# Patient Record
Sex: Female | Born: 1945
Health system: Northeastern US, Academic
[De-identification: ages and names within clinical notes are randomized; demographics above are authoritative.]

## PROBLEM LIST (undated history)

## (undated) DIAGNOSIS — I10 Essential (primary) hypertension: Secondary | ICD-10-CM

## (undated) DIAGNOSIS — E119 Type 2 diabetes mellitus without complications: Secondary | ICD-10-CM

## (undated) DIAGNOSIS — C259 Malignant neoplasm of pancreas, unspecified: Secondary | ICD-10-CM

## (undated) HISTORY — DX: Malignant neoplasm of pancreas, unspecified: C25.9

## (undated) HISTORY — PX: CHOLECYSTECTOMY: SHX55

## (undated) HISTORY — PX: APPENDECTOMY: SHX54

## (undated) HISTORY — DX: Essential (primary) hypertension: I10

## (undated) HISTORY — PX: BACK SURGERY: SHX140

## (undated) HISTORY — DX: Type 2 diabetes mellitus without complications: E11.9

---

## 2015-08-02 ENCOUNTER — Encounter: Payer: Self-pay | Admitting: Internal Medicine

## 2019-08-01 ENCOUNTER — Inpatient Hospital Stay
Admit: 2019-08-01 | Disposition: A | Discharge status: Home Health | Source: Other Acute Inpatient Hospital | Attending: Surgery | Admitting: Surgery

## 2019-08-01 ENCOUNTER — Observation Stay: Admission: AD | Admit: 2019-08-01 | Payer: 59

## 2019-08-02 LAB — HX CHEM-PANELS
HX ANION GAP: 10 (ref 3–14)
HX ANION GAP: 6 (ref 3–14)
HX BLOOD UREA NITROGEN: 4 mg/dL — ABNORMAL LOW (ref 6–24)
HX BLOOD UREA NITROGEN: 6 mg/dL (ref 6–24)
HX CHLORIDE (CL): 106 meq/L (ref 98–110)
HX CHLORIDE (CL): 106 meq/L (ref 98–110)
HX CO2: 24 meq/L (ref 20–30)
HX CO2: 28 meq/L (ref 20–30)
HX CREATININE (CR): 0.71 mg/dL (ref 0.57–1.30)
HX CREATININE (CR): 0.73 mg/dL (ref 0.57–1.30)
HX GFR, AFRICAN AMERICAN: 93 mL/min/{1.73_m2}
HX GFR, AFRICAN AMERICAN: 97 mL/min/{1.73_m2}
HX GFR, NON-AFRICAN AMERICAN: 81 mL/min/{1.73_m2}
HX GFR, NON-AFRICAN AMERICAN: 83 mL/min/{1.73_m2}
HX GLUCOSE: 122 mg/dL (ref 70–139)
HX GLUCOSE: 140 mg/dL — ABNORMAL HIGH (ref 70–139)
HX POTASSIUM (K): 3.3 meq/L — ABNORMAL LOW (ref 3.6–5.1)
HX POTASSIUM (K): 3.5 meq/L — ABNORMAL LOW (ref 3.6–5.1)
HX SODIUM (NA): 140 meq/L (ref 135–145)
HX SODIUM (NA): 140 meq/L (ref 135–145)

## 2019-08-02 LAB — HX HEM-ROUTINE
HX BASO #: 0 10*3/uL (ref 0.0–0.2)
HX BASO: 1 %
HX EOSIN #: 0.1 10*3/uL (ref 0.0–0.5)
HX EOSIN: 2 %
HX HCT: 32.2 % (ref 32.0–45.0)
HX HGB: 10.9 g/dL — ABNORMAL LOW (ref 11.0–15.0)
HX IMMATURE GRANULOCYTE#: 0 10*3/uL (ref 0.0–0.1)
HX IMMATURE GRANULOCYTE: 1 %
HX LYMPH #: 1.4 10*3/uL (ref 1.0–4.0)
HX LYMPH: 25 %
HX MCH: 29.2 pg (ref 26.0–34.0)
HX MCHC: 33.9 g/dL (ref 32.0–36.0)
HX MCV: 86.3 fL (ref 80.0–98.0)
HX MONO #: 0.5 10*3/uL (ref 0.2–0.8)
HX MONO: 9 %
HX MPV: 9.9 fL (ref 9.1–11.7)
HX NEUT #: 3.4 10*3/uL (ref 1.5–7.5)
HX NRBC #: 0 10*3/uL
HX NUCLEATED RBC: 0 %
HX PLT: 287 10*3/uL (ref 150–400)
HX RBC BLOOD COUNT: 3.73 M/uL (ref 3.70–5.00)
HX RDW: 13.9 % (ref 11.5–14.5)
HX SEG NEUT: 63 %
HX WBC: 5.4 10*3/uL (ref 4.0–11.0)

## 2019-08-02 LAB — HX COAGULATION
HX INR PT: 1.1 (ref 0.9–1.3)
HX PROTHROMBIN TIME: 12.8 s (ref 9.7–14.0)
HX PTT: 29.5 s (ref 25.7–35.7)

## 2019-08-02 LAB — HX POINT OF CARE
HX GLUCOSE-POCT: 110 mg/dL (ref 70–139)
HX GLUCOSE-POCT: 110 mg/dL (ref 70–139)
HX GLUCOSE-POCT: 99 mg/dL (ref 70–139)
HX GLUCOSE-POCT: 97 mg/dL (ref 70–139)

## 2019-08-02 LAB — HX CHEM-LFT
HX ALANINE AMINOTRANSFERASE (ALT/SGPT): 34 IU/L (ref 0–54)
HX ALKALINE PHOSPHATASE (ALK): 260 IU/L — ABNORMAL HIGH (ref 40–130)
HX ASPARTATE AMINOTRANFERASE (AST/SGOT): 46 IU/L — ABNORMAL HIGH (ref 10–42)
HX BILIRUBIN, DIRECT: 0.5 mg/dL (ref 0.0–0.5)
HX BILIRUBIN, TOTAL: 0.6 mg/dL (ref 0.2–1.1)
HX LACTATE DEHYDROGENASE (LDH): 194 IU/L (ref 120–220)

## 2019-08-02 LAB — HX CHEM-OTHER
HX ALBUMIN: 3.3 g/dL — ABNORMAL LOW (ref 3.4–4.8)
HX CALCIUM (CA): 8.4 mg/dL — ABNORMAL LOW (ref 8.5–10.5)
HX CALCIUM (CA): 8.5 mg/dL (ref 8.5–10.5)
HX LIPASE: 220 IU/L — ABNORMAL HIGH (ref 8–60)
HX MAGNESIUM: 1.5 mg/dL — ABNORMAL LOW (ref 1.6–2.6)
HX MAGNESIUM: 1.5 mg/dL — ABNORMAL LOW (ref 1.6–2.6)
HX PHOSPHORUS: 2.8 mg/dL (ref 2.7–4.5)
HX PHOSPHORUS: 3.6 mg/dL (ref 2.7–4.5)

## 2019-08-02 LAB — HX MICRO-RESP VIRAL PANEL: HX COVID-19 (SARS-COV-2) ADMIT SCREEN: NEGATIVE

## 2019-08-02 LAB — HX DIABETES
HX GLUCOSE: 122 mg/dL (ref 70–139)
HX GLUCOSE: 140 mg/dL — ABNORMAL HIGH (ref 70–139)

## 2019-08-03 LAB — HX POINT OF CARE
HX GLUCOSE-POCT: 74 mg/dL (ref 70–139)
HX GLUCOSE-POCT: 83 mg/dL (ref 70–139)
HX GLUCOSE-POCT: 93 mg/dL (ref 70–139)
HX GLUCOSE-POCT: 99 mg/dL (ref 70–139)

## 2019-08-03 LAB — HX CHOLESTEROL
HX CHOLESTEROL: 146 mg/dL (ref 110–199)
HX HIGH DENSITY LIPOPROTEIN CHOL (HDL): 42 mg/dL (ref 35–75)
HX LDL: 79 mg/dL (ref 0–129)
HX TRIGLYCERIDES: 126 mg/dL (ref 40–250)

## 2019-08-03 LAB — HX HEM-ROUTINE
HX BASO #: 0 10*3/uL (ref 0.0–0.2)
HX BASO: 1 %
HX EOSIN #: 0.1 10*3/uL (ref 0.0–0.5)
HX EOSIN: 3 %
HX HCT: 32.2 % (ref 32.0–45.0)
HX HGB: 10.6 g/dL — ABNORMAL LOW (ref 11.0–15.0)
HX IMMATURE GRANULOCYTE#: 0 10*3/uL (ref 0.0–0.1)
HX IMMATURE GRANULOCYTE: 1 %
HX LYMPH #: 1.2 10*3/uL (ref 1.0–4.0)
HX LYMPH: 27 %
HX MCH: 29.8 pg (ref 26.0–34.0)
HX MCHC: 32.9 g/dL (ref 32.0–36.0)
HX MCV: 90.4 fL (ref 80.0–98.0)
HX MONO #: 0.3 10*3/uL (ref 0.2–0.8)
HX MONO: 7 %
HX MPV: 11.3 fL (ref 9.1–11.7)
HX NEUT #: 2.9 10*3/uL (ref 1.5–7.5)
HX NRBC #: 0 10*3/uL
HX NUCLEATED RBC: 0 %
HX PLT: 204 10*3/uL (ref 150–400)
HX RBC BLOOD COUNT: 3.56 M/uL — ABNORMAL LOW (ref 3.70–5.00)
HX RDW: 14.3 % (ref 11.5–14.5)
HX SEG NEUT: 62 %
HX WBC: 4.7 10*3/uL (ref 4.0–11.0)

## 2019-08-03 LAB — HX CHEM-PANELS
HX ANION GAP: 10 (ref 3–14)
HX BLOOD UREA NITROGEN: 5 mg/dL — ABNORMAL LOW (ref 6–24)
HX CHLORIDE (CL): 106 meq/L (ref 98–110)
HX CO2: 19 meq/L — ABNORMAL LOW (ref 20–30)
HX CREATININE (CR): 0.74 mg/dL (ref 0.57–1.30)
HX GFR, AFRICAN AMERICAN: 92 mL/min/{1.73_m2}
HX GFR, NON-AFRICAN AMERICAN: 79 mL/min/{1.73_m2}
HX GLUCOSE: 96 mg/dL (ref 70–139)
HX POTASSIUM (K): 4.7 meq/L (ref 3.6–5.1)
HX SODIUM (NA): 135 meq/L (ref 135–145)

## 2019-08-03 LAB — HX CHEM-LFT
HX CA 125 - FUTURE, ABBOTT: 26 U/mL (ref 0–34)
HX CEA: 3.7 ng/mL (ref 0.0–4.9)

## 2019-08-03 LAB — HX CHEM-LIPIDS
HX CHOL-HDL RATIO: 3.5
HX CHOLESTEROL: 146 mg/dL (ref 110–199)
HX HIGH DENSITY LIPOPROTEIN CHOL (HDL): 42 mg/dL (ref 35–75)
HX HOURS FAST: 0 h
HX LDL: 79 mg/dL (ref 0–129)
HX TRIGLYCERIDES: 126 mg/dL (ref 40–250)

## 2019-08-03 LAB — HX CHEM-OTHER
HX CALCIUM (CA): 8.4 mg/dL — ABNORMAL LOW (ref 8.5–10.5)
HX MAGNESIUM: 1.6 mg/dL (ref 1.6–2.6)
HX PHOSPHORUS: 3.6 mg/dL (ref 2.7–4.5)

## 2019-08-03 LAB — HX DIABETES: HX GLUCOSE: 96 mg/dL (ref 70–139)

## 2019-08-04 ENCOUNTER — Other Ambulatory Visit: Payer: Self-pay | Admitting: Hematology

## 2019-08-04 ENCOUNTER — Other Ambulatory Visit: Payer: Self-pay

## 2019-08-04 LAB — HX HEM-ROUTINE
HX BASO #: 0 10*3/uL (ref 0.0–0.2)
HX BASO: 1 %
HX EOSIN #: 0.1 10*3/uL (ref 0.0–0.5)
HX EOSIN: 3 %
HX HCT: 31.1 % — ABNORMAL LOW (ref 32.0–45.0)
HX HGB: 10.4 g/dL — ABNORMAL LOW (ref 11.0–15.0)
HX IMMATURE GRANULOCYTE#: 0 10*3/uL (ref 0.0–0.1)
HX IMMATURE GRANULOCYTE: 1 %
HX LYMPH #: 1.4 10*3/uL (ref 1.0–4.0)
HX LYMPH: 32 %
HX MCH: 29.5 pg (ref 26.0–34.0)
HX MCHC: 33.4 g/dL (ref 32.0–36.0)
HX MCV: 88.1 fL (ref 80.0–98.0)
HX MONO #: 0.3 10*3/uL (ref 0.2–0.8)
HX MONO: 7 %
HX MPV: 9.9 fL (ref 9.1–11.7)
HX NEUT #: 2.6 10*3/uL (ref 1.5–7.5)
HX NRBC #: 0 10*3/uL
HX NUCLEATED RBC: 0 %
HX PLT: 271 10*3/uL (ref 150–400)
HX RBC BLOOD COUNT: 3.53 M/uL — ABNORMAL LOW (ref 3.70–5.00)
HX RDW: 14.3 % (ref 11.5–14.5)
HX SEG NEUT: 57 %
HX WBC: 4.5 10*3/uL (ref 4.0–11.0)

## 2019-08-04 LAB — HX CHEM-PANELS
HX ANION GAP: 10 (ref 3–14)
HX BLOOD UREA NITROGEN: 5 mg/dL — ABNORMAL LOW (ref 6–24)
HX CHLORIDE (CL): 105 meq/L (ref 98–110)
HX CO2: 23 meq/L (ref 20–30)
HX CREATININE (CR): 0.75 mg/dL (ref 0.57–1.30)
HX GFR, AFRICAN AMERICAN: 90 mL/min/{1.73_m2}
HX GFR, NON-AFRICAN AMERICAN: 78 mL/min/{1.73_m2}
HX GLUCOSE: 96 mg/dL (ref 70–139)
HX POTASSIUM (K): 3.7 meq/L (ref 3.6–5.1)
HX SODIUM (NA): 138 meq/L (ref 135–145)

## 2019-08-04 LAB — HX CHEM-OTHER
HX CALCIUM (CA): 8.4 mg/dL — ABNORMAL LOW (ref 8.5–10.5)
HX MAGNESIUM: 1.3 mg/dL — ABNORMAL LOW (ref 1.6–2.6)
HX PHOSPHORUS: 4 mg/dL (ref 2.7–4.5)

## 2019-08-04 LAB — HX COAGULATION
HX INR PT: 1.1 (ref 0.9–1.3)
HX PROTHROMBIN TIME: 13 s (ref 9.7–14.0)
HX PTT: 41.9 s — ABNORMAL HIGH (ref 25.7–35.7)

## 2019-08-04 LAB — HX POINT OF CARE
HX GLUCOSE-POCT: 78 mg/dL (ref 70–139)
HX GLUCOSE-POCT: 121 mg/dL (ref 70–139)
HX GLUCOSE-POCT: 85 mg/dL (ref 70–139)
HX GLUCOSE-POCT: 219 mg/dL — ABNORMAL HIGH (ref 70–139)
HX GLUCOSE-POCT: 428 mg/dL — ABNORMAL HIGH (ref 70–139)

## 2019-08-04 LAB — HX DIABETES: HX GLUCOSE: 96 mg/dL (ref 70–139)

## 2019-08-04 LAB — HX TRANSFUSION
HX ABO RH CONFIRMATORY TYPE, MANUAL: A POS
HX ABO-RH INTERPRETATION (GEL): A POS
HX ANTIBODY SCREEN (GEL): NEGATIVE

## 2019-08-05 LAB — HX POINT OF CARE
HX GLUCOSE-POCT: 137 mg/dL (ref 70–139)
HX GLUCOSE-POCT: 146 mg/dL — ABNORMAL HIGH (ref 70–139)
HX GLUCOSE-POCT: 135 mg/dL (ref 70–139)
HX GLUCOSE-POCT: 172 mg/dL — ABNORMAL HIGH (ref 70–139)

## 2019-08-05 LAB — HX IMMUNOLOGY
HX IGG SUBCLASS TOTAL: 1047 mg/dL
HX IGG SUBCLASS-1: 633 mg/dL
HX IGG SUBCLASS-2: 217 mg/dL — ABNORMAL LOW
HX IGG SUBCLASS-3: 73 mg/dL
HX IGG SUBCLASS-4: 12.3 mg/dL

## 2019-08-05 LAB — HX CHEM-PANELS
HX ANION GAP: 10 (ref 3–14)
HX BLOOD UREA NITROGEN: 6 mg/dL (ref 6–24)
HX CHLORIDE (CL): 101 meq/L (ref 98–110)
HX CO2: 28 meq/L (ref 20–30)
HX CREATININE (CR): 0.72 mg/dL (ref 0.57–1.30)
HX GFR, AFRICAN AMERICAN: 95 mL/min/{1.73_m2}
HX GFR, NON-AFRICAN AMERICAN: 82 mL/min/{1.73_m2}
HX GLUCOSE: 144 mg/dL — ABNORMAL HIGH (ref 70–139)
HX POTASSIUM (K): 3.4 meq/L — ABNORMAL LOW (ref 3.6–5.1)
HX SODIUM (NA): 139 meq/L (ref 135–145)

## 2019-08-05 LAB — HX DIABETES: HX GLUCOSE: 144 mg/dL — ABNORMAL HIGH (ref 70–139)

## 2019-08-05 LAB — HX CHEM-LFT: HX CA 19-9: 1901 U/mL — ABNORMAL HIGH

## 2019-08-05 LAB — HX CHEM-OTHER
HX CALCIUM (CA): 8.4 mg/dL — ABNORMAL LOW (ref 8.5–10.5)
HX MAGNESIUM: 1.7 mg/dL (ref 1.6–2.6)
HX PHOSPHORUS: 3.7 mg/dL (ref 2.7–4.5)

## 2019-08-06 LAB — HX CHEM-PANELS
HX ANION GAP: 8 (ref 3–14)
HX BLOOD UREA NITROGEN: 12 mg/dL (ref 6–24)
HX CHLORIDE (CL): 103 meq/L (ref 98–110)
HX CO2: 25 meq/L (ref 20–30)
HX CREATININE (CR): 0.74 mg/dL (ref 0.57–1.30)
HX GFR, AFRICAN AMERICAN: 92 mL/min/{1.73_m2}
HX GFR, NON-AFRICAN AMERICAN: 79 mL/min/{1.73_m2}
HX GLUCOSE: 197 mg/dL — ABNORMAL HIGH (ref 70–139)
HX POTASSIUM (K): 4 meq/L (ref 3.6–5.1)
HX SODIUM (NA): 136 meq/L (ref 135–145)

## 2019-08-06 LAB — HX CHEM-OTHER
HX CALCIUM (CA): 8.5 mg/dL (ref 8.5–10.5)
HX MAGNESIUM: 1.8 mg/dL (ref 1.6–2.6)
HX PHOSPHORUS: 4.1 mg/dL (ref 2.7–4.5)

## 2019-08-06 LAB — HX POINT OF CARE
HX GLUCOSE-POCT: 132 mg/dL (ref 70–139)
HX GLUCOSE-POCT: 159 mg/dL — ABNORMAL HIGH (ref 70–139)
HX GLUCOSE-POCT: 172 mg/dL — ABNORMAL HIGH (ref 70–139)
HX GLUCOSE-POCT: 256 mg/dL — ABNORMAL HIGH (ref 70–139)

## 2019-08-06 LAB — HX CYTO GYN & NON GYN

## 2019-08-06 LAB — HX DIABETES: HX GLUCOSE: 197 mg/dL — ABNORMAL HIGH (ref 70–139)

## 2019-08-06 MED FILL — *ONDANSETRON HCL 4 MG TAB: 5 days supply | Qty: 20 | Fill #0 | Status: AC

## 2019-08-07 LAB — HX POINT OF CARE
HX GLUCOSE-POCT: 180 mg/dL — ABNORMAL HIGH (ref 70–139)
HX GLUCOSE-POCT: 286 mg/dL — ABNORMAL HIGH (ref 70–139)
HX GLUCOSE-POCT: 104 mg/dL (ref 70–139)
HX GLUCOSE-POCT: 311 mg/dL — ABNORMAL HIGH (ref 70–139)
HX GLUCOSE-POCT: 327 mg/dL — ABNORMAL HIGH (ref 70–139)
HX GLUCOSE-POCT: 332 mg/dL — ABNORMAL HIGH (ref 70–139)

## 2019-08-07 LAB — HX CHEM-PANELS
HX ANION GAP: 8 (ref 3–14)
HX BLOOD UREA NITROGEN: 19 mg/dL (ref 6–24)
HX CHLORIDE (CL): 105 meq/L (ref 98–110)
HX CO2: 25 meq/L (ref 20–30)
HX CREATININE (CR): 0.73 mg/dL (ref 0.57–1.30)
HX GFR, AFRICAN AMERICAN: 93 mL/min/{1.73_m2}
HX GFR, NON-AFRICAN AMERICAN: 81 mL/min/{1.73_m2}
HX GLUCOSE: 189 mg/dL — ABNORMAL HIGH (ref 70–139)
HX POTASSIUM (K): 4.1 meq/L (ref 3.6–5.1)
HX SODIUM (NA): 138 meq/L (ref 135–145)

## 2019-08-07 LAB — HX CHEM-OTHER
HX CALCIUM (CA): 8.5 mg/dL (ref 8.5–10.5)
HX MAGNESIUM: 1.8 mg/dL (ref 1.6–2.6)
HX PHOSPHORUS: 4.3 mg/dL (ref 2.7–4.5)

## 2019-08-07 LAB — HX DIABETES: HX GLUCOSE: 189 mg/dL — ABNORMAL HIGH (ref 70–139)

## 2019-08-08 LAB — HX POINT OF CARE
HX GLUCOSE-POCT: 292 mg/dL — ABNORMAL HIGH (ref 70–139)
HX GLUCOSE-POCT: 276 mg/dL — ABNORMAL HIGH (ref 70–139)
HX GLUCOSE-POCT: 124 mg/dL (ref 70–139)
HX GLUCOSE-POCT: 434 mg/dL — ABNORMAL HIGH (ref 70–139)

## 2019-08-08 LAB — HX HEM-ROUTINE
HX BASO #: 0 10*3/uL (ref 0.0–0.2)
HX BASO: 1 %
HX EOSIN #: 0.1 10*3/uL (ref 0.0–0.5)
HX EOSIN: 3 %
HX HCT: 33.3 % (ref 32.0–45.0)
HX HGB: 11.3 g/dL (ref 11.0–15.0)
HX IMMATURE GRANULOCYTE#: 0.1 10*3/uL (ref 0.0–0.1)
HX IMMATURE GRANULOCYTE: 1 %
HX LYMPH #: 1.5 10*3/uL (ref 1.0–4.0)
HX LYMPH: 26 %
HX MCH: 29.4 pg (ref 26.0–34.0)
HX MCHC: 33.9 g/dL (ref 32.0–36.0)
HX MCV: 86.7 fL (ref 80.0–98.0)
HX MONO #: 0.5 10*3/uL (ref 0.2–0.8)
HX MONO: 10 %
HX MPV: 9.9 fL (ref 9.1–11.7)
HX NEUT #: 3.4 10*3/uL (ref 1.5–7.5)
HX NRBC #: 0 10*3/uL
HX NUCLEATED RBC: 0 %
HX PLT: 288 10*3/uL (ref 150–400)
HX RBC BLOOD COUNT: 3.84 M/uL (ref 3.70–5.00)
HX RDW: 14.1 % (ref 11.5–14.5)
HX SEG NEUT: 60 %
HX WBC: 5.6 10*3/uL (ref 4.0–11.0)

## 2019-08-08 LAB — HX CHEM-PANELS
HX ANION GAP: 7 (ref 3–14)
HX BLOOD UREA NITROGEN: 27 mg/dL — ABNORMAL HIGH (ref 6–24)
HX CHLORIDE (CL): 103 meq/L (ref 98–110)
HX CO2: 26 meq/L (ref 20–30)
HX CREATININE (CR): 0.77 mg/dL (ref 0.57–1.30)
HX GFR, AFRICAN AMERICAN: 88 mL/min/{1.73_m2}
HX GFR, NON-AFRICAN AMERICAN: 76 mL/min/{1.73_m2}
HX GLUCOSE: 312 mg/dL — ABNORMAL HIGH (ref 70–139)
HX POTASSIUM (K): 4.3 meq/L (ref 3.6–5.1)
HX SODIUM (NA): 136 meq/L (ref 135–145)

## 2019-08-08 LAB — HX CHEM-BLOODGAS: HX PH: 7.42 (ref 7.35–7.45)

## 2019-08-08 LAB — HX DIABETES: HX GLUCOSE: 312 mg/dL — ABNORMAL HIGH (ref 70–139)

## 2019-08-08 LAB — HX CHEM-OTHER
HX IONIZED CALCIUM: 4.61 mg/dL (ref 4.20–5.20)
HX MAGNESIUM: 1.9 mg/dL (ref 1.6–2.6)
HX PHOSPHORUS: 4.2 mg/dL (ref 2.7–4.5)

## 2019-08-09 LAB — HX HEM-ROUTINE
HX BASO #: 0 10*3/uL (ref 0.0–0.2)
HX BASO: 1 %
HX EOSIN #: 0.1 10*3/uL (ref 0.0–0.5)
HX EOSIN: 2 %
HX HCT: 33.2 % (ref 32.0–45.0)
HX HGB: 11 g/dL (ref 11.0–15.0)
HX IMMATURE GRANULOCYTE#: 0 10*3/uL (ref 0.0–0.1)
HX IMMATURE GRANULOCYTE: 1 %
HX LYMPH #: 1.2 10*3/uL (ref 1.0–4.0)
HX LYMPH: 22 %
HX MCH: 29.3 pg (ref 26.0–34.0)
HX MCHC: 33.1 g/dL (ref 32.0–36.0)
HX MCV: 88.5 fL (ref 80.0–98.0)
HX MONO #: 0.5 10*3/uL (ref 0.2–0.8)
HX MONO: 9 %
HX MPV: 10.1 fL (ref 9.1–11.7)
HX NEUT #: 3.6 10*3/uL (ref 1.5–7.5)
HX NRBC #: 0 10*3/uL
HX NUCLEATED RBC: 0 %
HX PLT: 298 10*3/uL (ref 150–400)
HX RBC BLOOD COUNT: 3.75 M/uL (ref 3.70–5.00)
HX RDW: 14.2 % (ref 11.5–14.5)
HX SEG NEUT: 66 %
HX WBC: 5.5 10*3/uL (ref 4.0–11.0)

## 2019-08-09 LAB — HX CHEM-PANELS
HX ANION GAP: 8 (ref 3–14)
HX BLOOD UREA NITROGEN: 31 mg/dL — ABNORMAL HIGH (ref 6–24)
HX CHLORIDE (CL): 101 meq/L (ref 98–110)
HX CO2: 27 meq/L (ref 20–30)
HX CREATININE (CR): 0.83 mg/dL (ref 0.57–1.30)
HX GFR, AFRICAN AMERICAN: 80 mL/min/{1.73_m2}
HX GFR, NON-AFRICAN AMERICAN: 69 mL/min/{1.73_m2}
HX GLUCOSE: 402 mg/dL — ABNORMAL HIGH (ref 70–139)
HX POTASSIUM (K): 4.2 meq/L (ref 3.6–5.1)
HX SODIUM (NA): 136 meq/L (ref 135–145)

## 2019-08-09 LAB — HX CHEM-OTHER
HX CALCIUM (CA): 8.7 mg/dL (ref 8.5–10.5)
HX MAGNESIUM: 1.9 mg/dL (ref 1.6–2.6)
HX PHOSPHORUS: 4 mg/dL (ref 2.7–4.5)

## 2019-08-09 LAB — HX POINT OF CARE
HX GLUCOSE-POCT: 309 mg/dL — ABNORMAL HIGH (ref 70–139)
HX GLUCOSE-POCT: 382 mg/dL — ABNORMAL HIGH (ref 70–139)
HX GLUCOSE-POCT: 274 mg/dL — ABNORMAL HIGH (ref 70–139)
HX GLUCOSE-POCT: 111 mg/dL (ref 70–139)
HX GLUCOSE-POCT: 338 mg/dL — ABNORMAL HIGH (ref 70–139)

## 2019-08-09 LAB — HX DIABETES: HX GLUCOSE: 402 mg/dL — ABNORMAL HIGH (ref 70–139)

## 2019-08-10 LAB — HX CHEM-METABOLIC: HX HEMOGLOBIN A1C: 7.7 % — ABNORMAL HIGH

## 2019-08-10 LAB — HX CHEM-PANELS
HX ANION GAP: 7 (ref 3–14)
HX BLOOD UREA NITROGEN: 36 mg/dL — ABNORMAL HIGH (ref 6–24)
HX CHLORIDE (CL): 102 meq/L (ref 98–110)
HX CO2: 27 meq/L (ref 20–30)
HX CREATININE (CR): 0.83 mg/dL (ref 0.57–1.30)
HX GFR, AFRICAN AMERICAN: 80 mL/min/{1.73_m2}
HX GFR, NON-AFRICAN AMERICAN: 69 mL/min/{1.73_m2}
HX GLUCOSE: 345 mg/dL — ABNORMAL HIGH (ref 70–139)
HX POTASSIUM (K): 3.7 meq/L (ref 3.6–5.1)
HX SODIUM (NA): 136 meq/L (ref 135–145)

## 2019-08-10 LAB — HX HEM-ROUTINE
HX BASO #: 0 10*3/uL (ref 0.0–0.2)
HX BASO: 1 %
HX EOSIN #: 0.2 10*3/uL (ref 0.0–0.5)
HX EOSIN: 3 %
HX HCT: 32.9 % (ref 32.0–45.0)
HX HGB: 11 g/dL (ref 11.0–15.0)
HX IMMATURE GRANULOCYTE#: 0.1 10*3/uL (ref 0.0–0.1)
HX IMMATURE GRANULOCYTE: 1 %
HX LYMPH #: 1.8 10*3/uL (ref 1.0–4.0)
HX LYMPH: 28 %
HX MCH: 29.5 pg (ref 26.0–34.0)
HX MCHC: 33.4 g/dL (ref 32.0–36.0)
HX MCV: 88.2 fL (ref 80.0–98.0)
HX MONO #: 0.6 10*3/uL (ref 0.2–0.8)
HX MONO: 9 %
HX MPV: 10.2 fL (ref 9.1–11.7)
HX NEUT #: 3.8 10*3/uL (ref 1.5–7.5)
HX NRBC #: 0 10*3/uL
HX NUCLEATED RBC: 0 %
HX PLT: 297 10*3/uL (ref 150–400)
HX RBC BLOOD COUNT: 3.73 M/uL (ref 3.70–5.00)
HX RDW: 13.8 % (ref 11.5–14.5)
HX SEG NEUT: 59 %
HX WBC: 6.4 10*3/uL (ref 4.0–11.0)

## 2019-08-10 LAB — HX SURGICAL

## 2019-08-10 LAB — HX CHEM-OTHER
HX CALCIUM (CA): 8.8 mg/dL (ref 8.5–10.5)
HX MAGNESIUM: 1.8 mg/dL (ref 1.6–2.6)
HX PHOSPHORUS: 4.1 mg/dL (ref 2.7–4.5)

## 2019-08-10 LAB — HX DIABETES
HX GLUCOSE: 345 mg/dL — ABNORMAL HIGH (ref 70–139)
HX HEMOGLOBIN A1C: 7.7 % — ABNORMAL HIGH

## 2019-08-10 LAB — HX POINT OF CARE
HX GLUCOSE-POCT: 291 mg/dL — ABNORMAL HIGH (ref 70–139)
HX GLUCOSE-POCT: 256 mg/dL — ABNORMAL HIGH (ref 70–139)
HX GLUCOSE-POCT: 128 mg/dL (ref 70–139)
HX GLUCOSE-POCT: 287 mg/dL — ABNORMAL HIGH (ref 70–139)

## 2019-08-10 LAB — HX COLONOSCOPY

## 2019-08-11 LAB — HX DIABETES: HX GLUCOSE: 313 mg/dL — ABNORMAL HIGH (ref 70–139)

## 2019-08-11 LAB — HX CHEM-PANELS
HX ANION GAP: 7 (ref 3–14)
HX BLOOD UREA NITROGEN: 38 mg/dL — ABNORMAL HIGH (ref 6–24)
HX CHLORIDE (CL): 101 meq/L (ref 98–110)
HX CO2: 27 meq/L (ref 20–30)
HX CREATININE (CR): 0.78 mg/dL (ref 0.57–1.30)
HX GFR, AFRICAN AMERICAN: 86 mL/min/{1.73_m2}
HX GFR, NON-AFRICAN AMERICAN: 74 mL/min/{1.73_m2}
HX GLUCOSE: 313 mg/dL — ABNORMAL HIGH (ref 70–139)
HX POTASSIUM (K): 3.9 meq/L (ref 3.6–5.1)
HX SODIUM (NA): 135 meq/L (ref 135–145)

## 2019-08-11 LAB — HX HEM-ROUTINE
HX BASO #: 0 10*3/uL (ref 0.0–0.2)
HX BASO: 1 %
HX EOSIN #: 0.2 10*3/uL (ref 0.0–0.5)
HX EOSIN: 3 %
HX HCT: 32.9 % (ref 32.0–45.0)
HX HGB: 10.8 g/dL — ABNORMAL LOW (ref 11.0–15.0)
HX IMMATURE GRANULOCYTE#: 0 10*3/uL (ref 0.0–0.1)
HX IMMATURE GRANULOCYTE: 1 %
HX LYMPH #: 1.7 10*3/uL (ref 1.0–4.0)
HX LYMPH: 28 %
HX MCH: 29 pg (ref 26.0–34.0)
HX MCHC: 32.8 g/dL (ref 32.0–36.0)
HX MCV: 88.4 fL (ref 80.0–98.0)
HX MONO #: 0.5 10*3/uL (ref 0.2–0.8)
HX MONO: 9 %
HX MPV: 10.6 fL (ref 9.1–11.7)
HX NEUT #: 3.6 10*3/uL (ref 1.5–7.5)
HX NRBC #: 0 10*3/uL
HX NUCLEATED RBC: 0 %
HX PLT: 301 10*3/uL (ref 150–400)
HX RBC BLOOD COUNT: 3.72 M/uL (ref 3.70–5.00)
HX RDW: 13.8 % (ref 11.5–14.5)
HX SEG NEUT: 59 %
HX WBC: 6.1 10*3/uL (ref 4.0–11.0)

## 2019-08-11 LAB — HX CHEM-OTHER
HX CALCIUM (CA): 8.8 mg/dL (ref 8.5–10.5)
HX MAGNESIUM: 1.8 mg/dL (ref 1.6–2.6)
HX PHOSPHORUS: 3.8 mg/dL (ref 2.7–4.5)

## 2019-08-11 LAB — HX POINT OF CARE
HX GLUCOSE-POCT: 290 mg/dL — ABNORMAL HIGH (ref 70–139)
HX GLUCOSE-POCT: 236 mg/dL — ABNORMAL HIGH (ref 70–139)

## 2019-08-16 ENCOUNTER — Ambulatory Visit: Admitting: Adult Health

## 2019-08-16 HISTORY — PX: GASTRIC BYPASS OPEN: SUR638

## 2019-08-16 NOTE — Progress Notes (Signed)
* * *      [name], [name] **DOB:** 1945/08/27 [phone] yo F) **Acc No.** [id number] **DOS:**  08/16/2019    ---       [name], [name]**    ------    55 Y old Female, DOB: 1946/05/10    [address] [place], [place] [address]    Home: [phone]    Provider: Dawna Part        * * *    Telephone Encounter    ---    Answered by  [name] Date: 08/16/2019       Time: 04:41 PM    Caller  pt's daughter in law    ------            Reason  blood sugar concern            Message                     Hello,       This patient's daughter in law called concerning patient's sugar levels. She tried to reach the PCP but was unable to get a hold of them. They were concerned about her high sugar levels. Her blood sugar reached 504 earlier but has gone down a little. She is on TPN so her blood sugar is erratic but this is the first time it has gone up that high. Patient was advised to go to the ER on Dr. Jonny Ruiz recommendation. Thanks                       Action Taken                     Schaefer,Erin  08/16/2019 4:43:13 PM >      [name]  08/17/2019 12:00:49 PM > currently inpatient                    * * *                ---          * * *         Provider: Dawna Part 08/16/2019    ---    Note generated by [employer] EMR/PM Software ([email or web address])

## 2019-08-17 ENCOUNTER — Inpatient Hospital Stay
Admit: 2019-08-17 | Disposition: A | Discharge status: Home Health | Source: Emergency Department | Attending: Surgery | Admitting: Surgery

## 2019-08-17 ENCOUNTER — Observation Stay: Admission: EM | Admit: 2019-08-17 | Payer: 59 | Source: Intra-hospital

## 2019-08-17 LAB — HX BF-URINALYSIS
HX HYALINE CAST: 0 /LPF
HX KETONES: NEGATIVE mg/dL
HX LEUKOCYTE ES: NEGATIVE
HX NITRITE LEVEL: NEGATIVE
HX RED BLOOD CELLS: 1 /HPF
HX SPECIFIC GRAVITY: 1.02
HX SQUAMOUS EPITHELIAL CELLS: NEGATIVE /HPF
HX U BACTERIA: NEGATIVE
HX U BILIRUBIN: NEGATIVE
HX U BLOOD: NEGATIVE
HX U PH: 7.5
HX U UROBILINIG: 0.2 EU
HX WHITE BLOOD CELLS: 2 /HPF

## 2019-08-17 LAB — HX CHEM-BLOODGAS
HX BASE EXCESS: 20.9 mmol/L
HX BICARBONATE: 47 meq/L — AB (ref 21–28)
HX CALCULATED CO2: 42 meq/L — ABNORMAL HIGH (ref 17–32)
HX CARBOXYHEMOGLOBIN: 1 % (ref 0.0–3.0)
HX FIO2: 99
HX METHEMOGLOBIN: 0.9 % (ref 0.0–1.8)
HX OXYGEN SATURATION, MEASURED (SVO2): 66.4 % — ABNORMAL LOW (ref 95.0–98.0)
HX PCO2: 70 mmHg — ABNORMAL HIGH (ref 35–45)
HX PH: 7.44 (ref 7.35–7.45)
HX PO2: 40 mmHg — ABNORMAL LOW (ref 85–105)

## 2019-08-17 LAB — HX HEM-ROUTINE
HX BASO #: 0.1 10*3/uL (ref 0.0–0.2)
HX BASO: 1 %
HX EOSIN #: 0 10*3/uL (ref 0.0–0.5)
HX EOSIN: 1 %
HX HCT: 37 % (ref 32.0–45.0)
HX HGB: 12 g/dL (ref 11.0–15.0)
HX IMMATURE GRANULOCYTE#: 0 10*3/uL (ref 0.0–0.1)
HX IMMATURE GRANULOCYTE: 0 %
HX LYMPH #: 1 10*3/uL (ref 1.0–4.0)
HX LYMPH: 13 %
HX MCH: 29.6 pg (ref 26.0–34.0)
HX MCHC: 32.4 g/dL (ref 32.0–36.0)
HX MCV: 91.1 fL (ref 80.0–98.0)
HX MONO #: 0.8 10*3/uL (ref 0.2–0.8)
HX MONO: 10 %
HX MPV: 11.5 fL (ref 9.1–11.7)
HX NEUT #: 5.7 10*3/uL (ref 1.5–7.5)
HX NRBC #: 0 10*3/uL
HX NUCLEATED RBC: 0 %
HX PLT: 386 10*3/uL (ref 150–400)
HX RBC BLOOD COUNT: 4.06 M/uL (ref 3.70–5.00)
HX RDW: 13.3 % (ref 11.5–14.5)
HX SEG NEUT: 76 %
HX WBC: 7.6 10*3/uL (ref 4.0–11.0)

## 2019-08-17 LAB — HX COAGULATION
HX INR PT: 1 (ref 0.9–1.3)
HX PROTHROMBIN TIME: 11.3 s (ref 9.7–14.0)
HX PTT: 27.6 s (ref 25.7–35.7)

## 2019-08-17 LAB — HX DIABETES: HX GLUCOSE: 729 mg/dL — AB (ref 70–139)

## 2019-08-17 LAB — HX POINT OF CARE
HX GLUCOSE-POCT: 468 mg/dL — AB (ref 70–139)
HX GLUCOSE-POCT: 289 mg/dL — ABNORMAL HIGH (ref 70–139)
HX GLUCOSE-POCT: 146 mg/dL — ABNORMAL HIGH (ref 70–139)
HX GLUCOSE-POCT: 243 mg/dL — ABNORMAL HIGH (ref 70–139)
HX GLUCOSE-POCT: 327 mg/dL — ABNORMAL HIGH (ref 70–139)

## 2019-08-17 LAB — HX CHEM-PANELS
HX ANION GAP: 10 (ref 3–14)
HX BLOOD UREA NITROGEN: 82 mg/dL — ABNORMAL HIGH (ref 6–24)
HX CHLORIDE (CL): 78 meq/L — ABNORMAL LOW (ref 98–110)
HX CO2: 48 meq/L — AB (ref 20–30)
HX CREATININE (CR): 1.83 mg/dL — ABNORMAL HIGH (ref 0.57–1.30)
HX GFR, AFRICAN AMERICAN: 31 mL/min/{1.73_m2} — ABNORMAL LOW
HX GFR, NON-AFRICAN AMERICAN: 27 mL/min/{1.73_m2} — ABNORMAL LOW
HX GLUCOSE: 729 mg/dL — AB (ref 70–139)
HX POTASSIUM (K): 4.6 meq/L (ref 3.6–5.1)
HX SODIUM (NA): 136 meq/L (ref 135–145)

## 2019-08-17 LAB — HX CHEM-OTHER
HX B-HYDROXYBUTYRATE SO: 0.1 mmol/L (ref 0.00–0.27)
HX LIPASE: 254 IU/L — ABNORMAL HIGH (ref 8–60)

## 2019-08-17 LAB — HX TRANSFUSION
HX ABO-RH INTERPRETATION (GEL): A POS
HX ANTIBODY SCREEN (GEL): NEGATIVE

## 2019-08-17 LAB — HX CHEM-LFT
HX ALANINE AMINOTRANSFERASE (ALT/SGPT): 22 IU/L (ref 0–54)
HX ALKALINE PHOSPHATASE (ALK): 200 IU/L — ABNORMAL HIGH (ref 40–130)
HX ASPARTATE AMINOTRANFERASE (AST/SGOT): 30 IU/L (ref 10–42)
HX BILIRUBIN, DIRECT: 0.2 mg/dL (ref 0.0–0.5)
HX BILIRUBIN, TOTAL: 0.5 mg/dL (ref 0.2–1.1)

## 2019-08-17 LAB — HX CHEM-ENZ-FRAC: HX TROPONIN I: 0.02 ng/mL (ref 0.00–0.03)

## 2019-08-17 LAB — HX MICRO-RESP VIRAL PANEL: HX COVID-19 (SARS-COV-2) ADMIT SCREEN: NEGATIVE

## 2019-08-17 NOTE — ED Provider Notes (Signed)
 Marland Kitchen  Name: [name], [name]  MRN: [id number]  Age: 74 yrs  Sex: Female  DOB: [birth year]  Arrival Date: 08/17/2019  Arrival Time: 03:49  Account#: [id number]  .  Working Diagnosis: Type 2 diabetes mellitus with hyperglycemia,  Syncope Ne  ar,   - Pancreatic head mass  PCP: Sherrill Raring  .  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 She was recently admitted 2/14-2/24 for worsening        duodenal stricture and N/V with inability to tolerate PO and        was found to have a 3.5 cm pancreatic head mass. She was        started on TPN, supplemented with 65 units of insulin due to        persistent hyperglycemia. FNA biopsies were taken of the        pancreatic head mass and enlarged LNs in the peripancreatic        region. Tumor markers showed CA 19-9 1901, CEA 3.7, CA 125 26.        She has upcoming follow up with Dr. Derrill Kay (surgical oncology)        on 3/5 with tentative whipple on 3/15. She also has upcoming        follow up with Dr. Autumn Messing (Endocrinology/Diabetes) on 3/10. She        was discharged home on 2/24 and has been generally feeling okay        though continues to feel nauseous with episodes of vomiting        almost daily. She reports fatigue and lightheadedness x 2-3        days along with elevated blood sugars at home in the 400s. She        denies chest pain or SOB, though sometimes experiences        palpitations. No cough or URI symptoms. No fevers or chills.        She reports intermittent epigastric abdominal pain which has        not worsened, no abdominal pain at this time. No diarrhea or        constipation. She denies flank pain or urinary symptoms. She        does report  polydipsia, no polyuria. No headache or vision        changes. She denies syncope but has felt near syncopal at times        with symptoms. The patient was seen in the [facility] ED        tonight and underwent labs remarkable for glucose 531 (K 3.4,        anion gap 15, CO2 41). VBG pH 7.5, pCO2 52.7, pO2 63.6, hCO3        42.3. LFTs with lipase 273, alk phos 211. COVID test negative.        Decision was made to transfer patient to [facility] where she receives        care. .  .  Historical:  - Allergies:  No known drug Allergies;  - Home Meds: amlodipine oral; Aspirin Oral; doxazosin oral; Famotidine Oral    Hydrochlorothiazide Oral; Lisinopril Oral; metformin Oral;    Metoprolol Tartrate Oral;  - PMHx: Diabetes-NIDDM; High Cholesterol; Hypertension;  - Social history: Smoking status: The patient is not a current  .  Name:Rockefeller, Spirit  [id number]  [id number]  Page 1 of 9  %%PAGE  .  Name: Chanie, Soucek  MRN: [id number]  Age: 24 yrs  Sex: Female  DOB: 07-10-1945  Arrival Date: 08/17/2019  Arrival Time: 03:49  Account#: [id number]  .  Working Diagnosis: Type 2 diabetes mellitus with hyperglycemia,  Syncope Ne  ar,   - Pancreatic head mass  PCP: Schneck, Katrin  .    smoker.  - Immunization History: Pneumococcal vaccine status is unknown.  - Source of Home Medications: Patient.  .  .  OB/GYN:  03:50 LMP N/A - Post-menopause                                        rc5  .  Vital Signs:  03:50 BP 116 / 66; Pulse 94; Resp 18; Temp 36.4; Pulse Ox 99% on R/A; rc5        Pain 6/10;  04:45 BP 148 / 83 Right Arm Supine (auto/reg); Pulse 88; Resp 20;     jm42        Pulse Ox 97% on R/A;  05:33 BP 133 / 75 Right Arm Supine (auto/reg); Pulse 101; Resp 20;    jm42        Pulse Ox 96% on R/A;  .  Glasgow Coma Score:  03:50 Eye Response: spontaneous(4). Verbal Response: oriented(5).     rc5        Motor Response: obeys commands(6). Total: 15.  04:17 Eye Response: spontaneous(4). Verbal Response: oriented(5).     jm42         Motor Response: obeys commands(6). Total: 15.  .  MDM:  .  03/02  03:51 Order name: EDIE_CAREPLAN                                       dispa  t  03/02  04:06 Order name: Glucose, Point of Care                              dispa  t  03/02  04:18 Order name: ALT/SPGT (Alanine Aminotransferase)                 sh31  03/02  04:18 Order name: AST/SGOT (Aspartate Aminotranferase)                sh31  03/02  04:18 Order name: Alk Phos (Alkaline Phosphatase)                     sh31  03/02  04:18 Order name: BUN (Blood Urea Nitrogen)                           sh31  03/02  04:18 Order name: Bilirubin, Direct  sh31  03/02  04:18 Order name: Bilirubin, Total                                    sh31  03/02  04:18 Order name: CBC/Diff (With Plt)                                 sh31  .  Name:Chismar, Ebonie  [id number]  [id number]  Page 2 of 9  %%PAGE  .  Name: Dannon, Perlow  MRN: [id number]  Age: 75 yrs  Sex: Female  DOB: [birth year]  Arrival Date: 08/17/2019  Arrival Time: 03:49  Account#: [id number]  .  Working Diagnosis: Type 2 diabetes mellitus with hyperglycemia,  Syncope Ne  ar,   - Pancreatic head mass  PCP: Sherrill Raring  .  03/02  04:18 Order name: CR (Creatinine)                                     sh31  03/02  04:18 Order name: GLU (Glucose)                                       sh31  03/02  04:18 Order name: Barnetta Hammersmith, Co2)                              sh31  03/02  04:18 Order name: Lipase                                              sh31  03/02  04:18 Order name: PT (Prothrombin Time With INR)                      sh31  03/02  04:18 Order name: PTT                                                 sh31  03/02  04:18 Order name: Urinalysis w/Reflex to Culture                      sh31  03/02  04:18 Order name: Tama Gander So                             sh31  03/02  04:18 Order name: VBG - Blood Gas                                     sh31  03/02  04:18 Order name:  Adult EKG (order using folder); Complete Time: 22:02RK27  03/02  04:18 Order name: EKG (order using folder); Complete Time: 04:44      sh31  03/02  04:18 Order name: Monitor; Complete Time: 04:43  sh31  03/02  04:18 Order name: Type + Screen                                       sh31  03/02  04:39 Order name: ADD TROPONIN I; Complete Time: 04:42                sh31  03/02  05:32 Order name: Troponin I                                          dispa  t  03/02  05:46 Order name: GFR, AA                                             dispa  t  03/02  05:46 Order name: GFR, NAA                                            dispa  t  .  Dispensed Medications:  04:30 Drug: NS - Sodium Chloride 500 mL Route: IV; Rate: Bolus; Site: jm42        left wrist; Delivery: Macro Drop Tubing;  04:33 Drug: Zofran 4 mg Route: IVP; Infused Over: 3 mins; Site: left  jm42        wrist;  .  Marland Kitchen  Name:Vandermeulen, [name]  [id number]  [id number]  Page 3 of 9  %%PAGE  .  Name: Derika, Eckles  MRN: [id number]  Age: 61 yrs  Sex: Female  DOB: 1946/05/21  Arrival Date: 08/17/2019  Arrival Time: 03:49  Account#: [id number]  .  Working Diagnosis: Type 2 diabetes mellitus with hyperglycemia,  Syncope Ne  ar,   - Pancreatic head mass  PCP: Sherrill Raring  .  Disposition Summary:  08/17/19 04:59  Hospitalization Ordered        Hospitalization Status: Inpatient Admission                     sh31        Location: Telemetry                                             sh31        Condition: Stable                                               sh31        Problem: new                                                    sh31        Symptoms: are unchanged  sh31        Bed/Room Type: Regular                                          sh31        Provider: Lau, Sandra(08/17/19 05:02)                           sh31        Room Assignment: [facility] 8(08/17/19 05:32)                        ab55        Diagnosis           - Type 2 diabetes mellitus with hyperglycemia                 sh31          - Syncope Near                                                sh31          - Pancreatic head mass                                        sh31        Forms:          - Handoff Communication Form                                  sh31          - Fax Summary                                                 sh31  Signatures:  Dispatcher, [employer]                          dispa  Leo Grosser                           Sec  Redgie Grayer                           MD   ss27  Shellia Cleverly                         RN   jm42  Cassandria Santee                          RN   rc5  Rosalita Chessman                           MD   ak40  Milly Jakob  PA-C sh31  Darla Lesches                            RN   ab55  Wynonia Sours                           Reg  QI69  .  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 She was recently admitted 2/14-2/24 for worsening        duodenal stricture and N/V with inability to tolerate PO and  .  Name:Thrush, Carnelia  [id number]  [id number]  Page 4 of 9  %%PAGE  .  Name: Aleena, Kirkeby  MRN: [id number]  Age: 9 yrs  Sex: Female  DOB: Nov 09, 1945  Arrival Date: 08/17/2019  Arrival Time: 03:49  Account#:  [id number]  .  Working Diagnosis: Type 2 diabetes mellitus with hyperglycemia,  Syncope Ne  ar,   - Pancreatic head mass  PCP: Sherrill Raring  .        was found to have a 3.5 cm pancreatic head mass. She was        started on TPN. FNA biopsies were taken of the pancreatic head        mass and enlarged LNs in the peripancreatic region. Tumor        markers showed CA 19-9 1901, CEA 3.7, CA 125 26. She has        upcoming follow up with Dr. Derrill Kay on 3/5 with tentative        whipple on 3/15. 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 She was recently admitted 2/14-2/24 for worsening        duodenal stricture and N/V with inability to tolerate PO and        was found to have a 3.5 cm pancreatic head mass. She was        started on TPN, supplemented with 65 units of insulin due to  persistent hyperglycemia. FNA biopsies were taken of the        pancreatic head mass and enlarged LNs in the peripancreatic        region. Tumor markers showed CA 19-9 1901, CEA 3.7, CA 125 26.        She has upcoming follow up with Dr. Derrill Kay (surgical oncology)        on 3/5 with tentative whipple on 3/15. She also has upcoming        follow up with Dr. Autumn Messing (Endocrinology/Diabetes) on 3/10. She        was discharged home on 2/24 and has been generally feeling okay        though continues to feel nauseous with episodes of vomiting        almost daily. She reports fatigue and lightheadedness x 2-3        days along with elevated blood sugars at home in the 400s. She        denies chest pain or SOB, though sometimes experiences        palpitations. No cough or URI symptoms. No fevers or chills.        She reports  intermittent epigastric abdominal pain which has        not worsened, no abdominal pain at this time. No diarrhea or        constipation. She denies flank pain or urinary symptoms. She        does report polydipsia, no polyuria. No headache or vision        changes. She denies syncope but has felt near syncopal at times        with symptoms. The patient was seen in the [facility] ED        tonight and underwent labs remarkable for. 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 She was recently admitted 2/14-2/24 for worsening        duodenal stricture and N/V with inability to tolerate PO and        was found to have a 3.5 cm pancreatic head mass. She was  .  Name:Bellavance, Clydell  [id number]  [id number]  Page 5 of 9  %%PAGE  .  Name: Maleni, Seyer  MRN: [id number]  Age: 89 yrs  Sex: Female  DOB: Jun 17, 1946  Arrival Date: 08/17/2019  Arrival Time: 03:49  Account#: [id number]  .  Working Diagnosis: Type 2 diabetes mellitus with hyperglycemia,  Syncope Ne  ar,   - Pancreatic head mass  PCP: Sherrill Raring  .        started on TPN, supplemented with 65 units of insulin due to        persistent hyperglycemia. FNA biopsies were taken of the        pancreatic head mass and enlarged LNs in the peripancreatic        region. Tumor markers showed CA 19-9 1901, CEA 3.7, CA 125 26.        She has upcoming follow up with Dr. Derrill Kay (  surgical oncology)        on 3/5 with tentative whipple on 3/15. She also has upcoming        follow up with Dr. Autumn Messing (Endocrinology/Diabetes) on 3/10. She        was discharged home on 2/24 and has been generally feeling okay        though continues to feel nauseous with  episodes of vomiting        almost daily. She reports fatigue and lightheadedness x 2-3        days along with elevated blood sugars at home in the 400s. She        denies chest pain or SOB, though sometimes experiences        palpitations. No cough or URI symptoms. No fevers or chills.        She reports intermittent epigastric abdominal pain which has        not worsened, no abdominal pain at this time. No diarrhea or        constipation. She denies flank pain or urinary symptoms. She        does report polydipsia, no polyuria. No headache or vision        changes. She denies syncope but has felt near syncopal at times        with symptoms. The patient was seen in the [facility] ED        tonight and underwent labs remarkable for glucose 531 (K . 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 She was recently admitted 2/14-2/24 for worsening        duodenal stricture and N/V with inability to tolerate PO and        was found to have a 3.5 cm pancreatic head mass. She was        started on TPN, supplemented with 65 units of insulin due to        persistent hyperglycemia. FNA biopsies were taken of the        pancreatic head mass and enlarged LNs in the peripancreatic        region. Tumor markers showed CA 19-9 1901, CEA 3.7, CA 125 26.        She has upcoming follow up with Dr. Derrill Kay (surgical oncology)        on 3/5 with tentative whipple on 3/15. She also has upcoming        follow up with Dr. Autumn Messing (Endocrinology/Diabetes) on 3/10. She        was discharged home on 2/24 and has been generally feeling okay        though continues to feel nauseous with episodes of vomiting        almost daily.  She reports fatigue and lightheadedness x 2-3        days along with elevated blood sugars at home in the 400s. She        denies chest pain or SOB, though sometimes experiences  palpitations. No cough or URI symptoms. No fevers or chills.        She reports intermittent epigastric abdominal pain which has  .  Name:Benavides, Dorothymae  [id number]  [id number]  Page 6 of 9  %%PAGE  .  Name: Monna, Crean  MRN: [id number]  Age: 36 yrs  Sex: Female  DOB: Nov 08, 1945  Arrival Date: 08/17/2019  Arrival Time: 03:49  Account#: [id number]  .  Working Diagnosis: Type 2 diabetes mellitus with hyperglycemia,  Syncope Ne  ar,   - Pancreatic head mass  PCP: Sherrill Raring  .        not worsened, no abdominal pain at this time. No diarrhea or        constipation. She denies flank pain or urinary symptoms. She        does report polydipsia, no polyuria. No headache or vision        changes. She denies syncope but has felt near syncopal at times        with symptoms. The patient was seen in the [facility] ED        tonight and underwent labs remarkable for glucose 531 (K 3.4,        anion gap 15, CO2 41.Marland Kitchen sh31  04:50 22:58 74 year old Spanish speaking female with history of HTN,  sh31        DM2, HLD, multifocal atrial tachycardia, cholecystitis s/p        cholecystectomy (2017) c/b biliary stricture s/p PTBD and CBD        stent, chronic pancreatitis and duodenal stricture, pancreatic        head mass transferred from [facility] ED for evaluation        of fatigue. She was recently admitted 2/14-2/24 for worsening        duodenal stricture and N/V with inability to tolerate PO and        was found to have a 3.5 cm pancreatic head mass. She was        started on TPN, supplemented with 65 units of insulin due to        persistent hyperglycemia. FNA biopsies were taken of the        pancreatic head mass and enlarged LNs in the peripancreatic        region. Tumor markers showed CA 19-9 1901, CEA 3.7, CA 125 26.         She has upcoming follow up with Dr. Derrill Kay (surgical oncology)        on 3/5 with tentative whipple on 3/15. She also has upcoming        follow up with Dr. Autumn Messing (Endocrinology/Diabetes) on 3/10. She        was discharged home on 2/24 and has been generally feeling okay        though continues to feel nauseous with episodes of vomiting        almost daily. She reports fatigue and lightheadedness x 2-3        days along with elevated blood sugars at home in the 400s. She        denies chest pain or SOB, though sometimes experiences        palpitations. No cough or URI symptoms. No fevers or chills.        She reports intermittent epigastric abdominal pain which has        not worsened, no abdominal pain at this time. No diarrhea or  constipation. She denies flank pain or urinary symptoms. She        does report polydipsia, no polyuria. No headache or vision        changes. She denies syncope but has felt near syncopal at times        with symptoms. The patient was seen in the [facility] ED        tonight and underwent labs remarkable for glucose 531 (K 3.4,        anion gap 15, CO2 41). VBG pH 7.5, pCO2 52.7, pO2 63.6, hCO3        42.3. LFTs with lipase 273, alk phos 211. COVID test negative.        Decision was made to transfer patient to [facility] where she receives        care. . sh31  05:02 04:59 Oncology, [place] sh31                                      sh31  05:09 04:59 sh31                                                      ab55  05:32 05:09 *PENDNG BED* ab55                                         ab55  .  Name:Zebrowski, Shianne  ZHY:[id number]  [id number]  Page 7 of 9  %%PAGE  .  Name: Katherleen, Folkes  MRN: [id number]  Age: 89 yrs  Sex: Female  DOB: 1945-09-27  Arrival Date: 08/17/2019  Arrival Time: 03:49  Account#: [id number]  .  Working Diagnosis: Type 2 diabetes mellitus with hyperglycemia,  Syncope Ne  ar,   - Pancreatic head mass  PCP: Sherrill Raring  .  05:54 20:66 74 year old Spanish speaking  female with history of IDDM, ak40        HTN, HLD, multifocal atrial tachycardia, cholecystitis s/p        cholecystectomy (2017) c/b biliary stricture s/p PTBD and CBD        stent, chronic pancreatitis and duodenal stricture, pancreatic        head mass transferred from [facility] ED for evaluation        of fatigue. She was recently admitted 2/14-2/24 for worsening        duodenal stricture and N/V with inability to tolerate PO and        was found to have a 3.5 cm pancreatic head mass. She was        started on TPN, supplemented with 65 units of insulin due to        persistent hyperglycemia. FNA biopsies were taken of the        pancreatic head mass and enlarged LNs in the peripancreatic        region. Tumor markers showed CA 19-9 1901, CEA 3.7, CA 125 26.        She has upcoming follow up with Dr. Derrill Kay (surgical oncology)        on 3/5 with tentative whipple on 3/15. She also has upcoming  follow up with Dr. Autumn Messing (Endocrinology/Diabetes) on 3/10. She        was discharged home on 2/24 and has been generally feeling okay        though continues to feel nauseous with episodes of vomiting        almost daily. She reports fatigue and lightheadedness x 2-3        days along with elevated blood sugars at home in the 400s. She        denies chest pain or SOB, though sometimes experiences        palpitations. No cough or URI symptoms. No fevers or chills.        She reports intermittent epigastric abdominal pain which has        not worsened, no abdominal pain at this time. No diarrhea or        constipation. She denies flank pain or urinary symptoms. She        does report polydipsia, no polyuria. No headache or vision        changes. She denies syncope but has felt near syncopal at times        with symptoms. The patient was seen in the [facility] ED        tonight and underwent labs remarkable for glucose 531 (K 3.4,        anion gap 15, CO2 41). VBG pH 7.5, pCO2 52.7, pO2 63.6, hCO3         42.3. LFTs with lipase 273, alk phos 211. COVID test negative.        Decision was made to transfer patient to [facility] where she receives        care. . sh31  05:33 11:49 74 year old Spanish speaking female with history of IDDM, ak40        HTN, HLD, multifocal atrial tachycardia, cholecystitis s/p        cholecystectomy (2017) c/b biliary stricture s/p PTBD and CBD        stent, chronic pancreatitis and duodenal stricture, pancreatic        head mass transferred from [facility] ED for evaluation        of fatigue. She was recently admitted 2/14-2/24 for worsening        duodenal stricture and N/V with inability to tolerate PO and        was found to have a 3.5 cm pancreatic head mass. She was        started on TPN, supplemented with 65 units of insulin due to        persistent hyperglycemia. FNA biopsies were taken of the  .  Name:Haque, Devika  [id number]  [id number]  Page 8 of 9  %%PAGE  .  Name: Kiondra, Caicedo  MRN: [id number]  Age: 61 yrs  Sex: Female  DOB: October 02, 1945  Arrival Date: 08/17/2019  Arrival Time: 03:49  Account#: [id number]  .  Working Diagnosis: Type 2 diabetes mellitus with hyperglycemia,  Syncope Ne  ar,   - Pancreatic head mass  PCP: Sherrill Raring  .        pancreatic head mass and enlarged LNs in the peripancreatic        region. Tumor markers showed CA 19-9 1901, CEA 3.7, CA 125 26.        She has upcoming follow up with Dr. Derrill Kay (surgical oncology)        on 3/5 with tentative whipple on 3/15. She also has upcoming  follow up with Dr. Autumn Messing (Endocrinology/Diabetes) on 3/10. She        was discharged home on 2/24 and has been generally feeling okay        though continues to feel nauseous with episodes of vomiting        almost daily. She reports fatigue and lightheadedness x 2-3        days along with elevated blood sugars at home in the 400s. She        denies chest pain or SOB, though sometimes experiences        palpitations. No cough or URI symptoms. No fevers or chills.         She reports intermittent epigastric abdominal pain which has        not worsened, no abdominal pain at this time. No diarrhea or        constipation. She denies flank pain or urinary symptoms. She        does report polydipsia, no polyuria. No headache or vision        changes. She denies syncope but has felt near syncopal at times        with symptoms. The patient was seen in the [facility] ED        tonight and underwent labs remarkable for glucose 531 (K 3.4,        anion gap 15, CO2 41). VBG pH 7.5, pCO2 52.7, pO2 63.6, hCO3        42.3. LFTs with lipase 273, alk phos 211. COVID test negative.        Decision was made to transfer patient to [facility] where she receives        care. Marland Kitchen ZO10  .  Document is preliminary until electronically or manually signed by the atte  nding physician  .  .  .  .  .  .  .  .  .  .  .  .  .  .  .  .  .  .  .  Name:Hughson, Tyjah  RUE:[id number]  [id number]  Page 9 of 9  .  %%END

## 2019-08-17 NOTE — ED Provider Notes (Signed)
 Marland Kitchen  Name: [name], [name]  MRN: [id number]  Age: 74 yrs  Sex: Female  DOB: January 03, 1946  Arrival Date: 08/17/2019  Arrival Time: 03:49  Account#: [id number]  Bed Pending Adult  PCP: Sherrill Raring  Chief Complaint:  - Fatigue, Pancreatic Mass- see inbound  .  Presentation:  03/02  03:50 Presenting complaint: EMS states: pacreatic mass followed by    rc5        [facility], whipple scheduled for 3/15, increasing fatigue, elevated        lipase, k 3.4, glucose 531, VSS, onc fellow aware of tx.  03:50 Method Of Arrival: EMS: [facility]                        rc5  03:50 Acuity: Adult 2                                                 rc5  .  Historical:  - Allergies:  03:50 No known drug Allergies;                                        rc5  - Home Meds:  03:50 amlodipine oral [Active]; Aspirin Oral [Active]; doxazosin oral rc5        [Active]; Famotidine Oral [Active]; Hydrochlorothiazide Oral        [Active]; Lisinopril Oral [Active]; metformin Oral [Active];        Metoprolol Tartrate Oral [Active];  - PMHx:  03:50 Diabetes-NIDDM; High Cholesterol; Hypertension;                 rc5  .  - Social history: Smoking status: The patient is not a current    smoker.  - Immunization History: Pneumococcal vaccine status is unknown.  - Source of Home Medications: Patient.  .  .  Vital Signs:  03:50 BP 116 / 66; Pulse 94; Resp 18; Temp 36.4; Pulse Ox 99% on R/A; rc5        Pain 6/10;  04:45 BP 148 / 83 Right Arm Supine (auto/reg); Pulse 88; Resp 20;     jm42        Pulse Ox 97% on R/A;  05:33 BP 133 / 75 Right Arm Supine (auto/reg); Pulse 101; Resp 20;    jm42        Pulse Ox 96% on R/A;  .  OB/GYN:  03:50 LMP N/A - Post-menopause                                        rc5  .  Glasgow Coma Score:  03:50 Eye Response: spontaneous(4). Verbal Response: oriented(5).     rc5        Motor Response: obeys commands(6). Total: 15.  04:17 Eye Response: spontaneous(4). Verbal Response: oriented(5).     jm42        Motor Response: obeys  commands(6). Total: 15.  .  .  Name:[name], [name]  [id number]  [id number]  Page 1 of 4  %%PAGE  .  Name: [name], [name]  MRN: [id number]  Age: 34 yrs  Sex: Female  DOB: [birth year]  Arrival Date: 08/17/2019  Arrival Time: 03:49  Account#: [id number]  Bed Pending Adult  PCP: Sherrill Raring  Chief Complaint:  - Fatigue, Pancreatic Mass- see inbound  .  Triage Assessment:  03:50 General: Appears ill, Behavior is appropriate for age,          rc5        cooperative. Pain: Complains of pain in back and abdomen.        Neuro: Eye opening: Spontaneously Verbal Response:.        Respiratory: No deficits noted.  .  Assessment:  04:17 General: Appears comfortable, well nourished, well groomed,     jm42        Behavior is appropriate for age, cooperative. Pain: Complains        of pain in back and abdomen Pain currently is 6/10. Neuro: No        deficits noted. Cardiovascular: Capillary refill < 3 seconds.        Geriatric Evaluation: Is patient > 75 years oldquestion No.        Respiratory: Airway is patent Respiratory effort is even,        unlabored, Respiratory pattern is regular, symmetrical. GI:        Abdomen is non- distended. Skin: Skin is intact, is healthy        with good turgor, Skin is normal. Nursing diagnosis: Alteration        in comfort: actual.  .  Observations:  03:49 Patient arrived in ED.                                          rc5  03:49 Patient Visited By: Roselyn Reef  03:50 Triage Completed.                                               rc5  03:51 Patient Visited By: Milly Jakob                              sh31  03:59 Patient Visited By: Milas Hock                               ss27  04:02 Registration completed.                                         TH43  04:02 Patient Visited By: Miquel Dunn  04:17 Nurse's Note: Pt is primarily Spanish speaking, [place]           jm42        interpreter ipad utilized to obtain  assessment information. Pt        is a transfer from an OSH where she presented today with  complaints of increased fatigue. Pt arrives with a LEFT upper        arm PICC line with a double lumen, the white lumen appears to        be infusing TPN via a home infusion pump. Pt also arrives with        a LEFT wrist = 20 gauge peripheral IV. Pt also noted to have        what appears to be a JP drain in her abdomen. Pt has a history        of pancreatitis and was recently found to have a pancreatic        mass, pt is tentatively scheduled for a whipple procedure on        08/30/19 at [facility]. Pt reports ongoing nausea and vomiting daily.  05:09 Patient assigned to A5                                          ab55  05:32 Patient assigned to A5                                          ab55  .  Procedure:  04:17 Maintain field IV. Dressing intact. Good blood return noted.    jm42  .  Name:[name], [name]  GUY:[id number]  [id number]  Page 2 of 4  %%PAGE  .  Name: [name], [name]  MRN: [id number]  Age: 49 yrs  Sex: Female  DOB: 1945-11-26  Arrival Date: 08/17/2019  Arrival Time: 03:49  Account#: [id number]  Bed Pending Adult  PCP: Sherrill Raring  Chief Complaint:  - Fatigue, Pancreatic Mass- see inbound  .        Site clean / dry. Gauge / site: LEFT upper arm = double lumen        PICC line. LEFT wrist = 20 gauge IV.Marland Kitchen  04:43 Type + Screen Sent.                                             jm42  04:43 VBG - Blood Gas Sent.                                           jm42  04:43 Beta-Hydroxybutyrate So Sent.                                   jm42  04:43 ALT/SPGT (Alanine Aminotransferase) Sent.                       jm42  04:43 AST/SGOT (Aspartate Aminotranferase) Sent.                      jm42  04:43 PT (Prothrombin Time With INR) Sent.                            jm42  04:43 LYTES (Na, K, Cl, Co2) Sent.  jm42  04:43 CBC/Diff (With Plt) Sent.                                        jm42  .  Dispensed Medications:  04:30 Drug: NS - Sodium Chloride 500 mL Route: IV; Rate: Bolus; Site: jm42        left wrist; Delivery: Macro Drop Tubing;  04:33 Drug: Zofran 4 mg Route: IVP; Infused Over: 3 mins; Site: left  jm42        wrist;  .  Marland Kitchen  Interventions:  03:57 Outside Patient Records Scanned into Chart                      rk1  04:04 POC Test Accucheck POC is 468 MD notified of POC results.       nc22  04:11 Demo Sheet Scanned into Chart                                   rk1  04:17 Armband on Call light in reach Bed in low position Side Rail up jm42        X 2. Cardiac monitor on. Pulse ox on. NIBP on. Verbal        reassurance given. Warm blanket given. Pillow given.  04:33 ECG/EKG Scanned into Chart                                      rk1  05:04 Critical Test Result Result(s): BiCarb 47 MD/PA Reported to and rc5        with read back: MD Shroff.  05:41 Patient Belongings Scanned into Chart                           rk1  .  Outcome:  04:59 Decision to Hospitalize by Provider.                            sh31  06:17 Patient left the ED.                                            ab55  .  Signatures:  Leo Grosser                           Sec  Redgie Grayer                           MD   ss27  Shellia Cleverly                         RN   jm42  Cassandria Santee                          RN   rc5  Milly Jakob  PA-C sh31  Darla Lesches                            RN   ab55  Wynonia Sours                           Reg  IT25  .  Name:[name], [name]  QDI:[id number]  [id number]  Page 3 of 4  %%PAGE  .  Name: [name], [name]  MRN: [id number]  Age: 48 yrs  Sex: Female  DOB: 1946/05/22  Arrival Date: 08/17/2019  Arrival Time: 03:49  Account#: [id number]  Bed Pending Adult  PCP: Sherrill Raring  Chief Complaint:  - Fatigue, Pancreatic Mass- see inbound  .  [name], [name]                              CCT   nc22  .  .  .  .  .  .  .  .  .  .  .  .  .  .  .  .  .  .  .  .  .  .  .  .  .  .  .  .  .  .  .  .  .  .  .  .  .  .  .  .  .  .  Name:[name], [name]  [id number]  [id number]  Page 4 of 4  .  %%END

## 2019-08-18 LAB — HX HEM-ROUTINE
HX BASO #: 0.1 10*3/uL (ref 0.0–0.2)
HX BASO: 1 %
HX EOSIN #: 0.1 10*3/uL (ref 0.0–0.5)
HX EOSIN: 1 %
HX HCT: 37.9 % (ref 32.0–45.0)
HX HGB: 12.3 g/dL (ref 11.0–15.0)
HX IMMATURE GRANULOCYTE#: 0 10*3/uL (ref 0.0–0.1)
HX IMMATURE GRANULOCYTE: 0 %
HX LYMPH #: 2 10*3/uL (ref 1.0–4.0)
HX LYMPH: 23 %
HX MCH: 29.3 pg (ref 26.0–34.0)
HX MCHC: 32.5 g/dL (ref 32.0–36.0)
HX MCV: 90.2 fL (ref 80.0–98.0)
HX MONO #: 0.8 10*3/uL (ref 0.2–0.8)
HX MONO: 9 %
HX MPV: 11.8 fL — ABNORMAL HIGH (ref 9.1–11.7)
HX NEUT #: 6.1 10*3/uL (ref 1.5–7.5)
HX NRBC #: 0 10*3/uL
HX NUCLEATED RBC: 0 %
HX PLT: 362 10*3/uL (ref 150–400)
HX RBC BLOOD COUNT: 4.2 M/uL (ref 3.70–5.00)
HX RDW: 13.5 % (ref 11.5–14.5)
HX SEG NEUT: 67 %
HX WBC: 9.1 10*3/uL (ref 4.0–11.0)

## 2019-08-18 LAB — HX CHEM-PANELS
HX ANION GAP: 19 — ABNORMAL HIGH (ref 3–14)
HX ANION GAP: 14 (ref 3–14)
HX ANION GAP: 18 — ABNORMAL HIGH (ref 3–14)
HX BLOOD UREA NITROGEN: 77 mg/dL — ABNORMAL HIGH (ref 6–24)
HX BLOOD UREA NITROGEN: 61 mg/dL — ABNORMAL HIGH (ref 6–24)
HX CHLORIDE (CL): 73 meq/L — ABNORMAL LOW (ref 98–110)
HX CHLORIDE (CL): 81 meq/L — ABNORMAL LOW (ref 98–110)
HX CHLORIDE (CL): 86 meq/L — ABNORMAL LOW (ref 98–110)
HX CO2: 53 meq/L — AB (ref 20–30)
HX CO2: 49 meq/L — AB (ref 20–30)
HX CO2: 42 meq/L — AB (ref 20–30)
HX CREATININE (CR): 1.76 mg/dL — ABNORMAL HIGH (ref 0.57–1.30)
HX CREATININE (CR): 1.68 mg/dL — ABNORMAL HIGH (ref 0.57–1.30)
HX GFR, AFRICAN AMERICAN: 32 mL/min/{1.73_m2} — ABNORMAL LOW
HX GFR, AFRICAN AMERICAN: 34 mL/min/{1.73_m2} — ABNORMAL LOW
HX GFR, NON-AFRICAN AMERICAN: 28 mL/min/{1.73_m2} — ABNORMAL LOW
HX GFR, NON-AFRICAN AMERICAN: 29 mL/min/{1.73_m2} — ABNORMAL LOW
HX GLUCOSE: 170 mg/dL — ABNORMAL HIGH (ref 70–139)
HX GLUCOSE: 244 mg/dL — ABNORMAL HIGH (ref 70–139)
HX POTASSIUM (K): 2.3 meq/L — AB (ref 3.6–5.1)
HX POTASSIUM (K): 2.8 meq/L — AB (ref 3.6–5.1)
HX POTASSIUM (K): 3.5 meq/L — ABNORMAL LOW (ref 3.6–5.1)
HX SODIUM (NA): 145 meq/L (ref 135–145)
HX SODIUM (NA): 144 meq/L (ref 135–145)
HX SODIUM (NA): 146 meq/L — ABNORMAL HIGH (ref 135–145)

## 2019-08-18 LAB — HX CHEM-OTHER
HX CALCIUM (CA): 9.7 mg/dL (ref 8.5–10.5)
HX CALCIUM (CA): 9.7 mg/dL (ref 8.5–10.5)
HX MAGNESIUM: 2.3 mg/dL (ref 1.6–2.6)
HX MAGNESIUM: 2.1 mg/dL (ref 1.6–2.6)
HX MAGNESIUM: 2.2 mg/dL (ref 1.6–2.6)
HX PHOSPHORUS: 6.2 mg/dL — ABNORMAL HIGH (ref 2.7–4.5)
HX PHOSPHORUS: 4 mg/dL (ref 2.7–4.5)
HX PHOSPHORUS: 2.4 mg/dL — ABNORMAL LOW (ref 2.7–4.5)

## 2019-08-18 LAB — HX POINT OF CARE
HX GLUCOSE-POCT: 120 mg/dL (ref 70–139)
HX GLUCOSE-POCT: 135 mg/dL (ref 70–139)
HX GLUCOSE-POCT: 167 mg/dL — ABNORMAL HIGH (ref 70–139)
HX GLUCOSE-POCT: 214 mg/dL — ABNORMAL HIGH (ref 70–139)

## 2019-08-18 LAB — HX DIABETES
HX GLUCOSE: 170 mg/dL — ABNORMAL HIGH (ref 70–139)
HX GLUCOSE: 244 mg/dL — ABNORMAL HIGH (ref 70–139)

## 2019-08-19 LAB — HX DIABETES
HX GLUCOSE: 154 mg/dL — ABNORMAL HIGH (ref 70–139)
HX GLUCOSE: 249 mg/dL — ABNORMAL HIGH (ref 70–139)

## 2019-08-19 LAB — HX POINT OF CARE
HX GLUCOSE-POCT: 100 mg/dL (ref 70–139)
HX GLUCOSE-POCT: 158 mg/dL — ABNORMAL HIGH (ref 70–139)
HX GLUCOSE-POCT: 208 mg/dL — ABNORMAL HIGH (ref 70–139)
HX GLUCOSE-POCT: 295 mg/dL — ABNORMAL HIGH (ref 70–139)

## 2019-08-19 LAB — HX CHEM-PANELS
HX ANION GAP: 18 — ABNORMAL HIGH (ref 3–14)
HX ANION GAP: 8 (ref 3–14)
HX BLOOD UREA NITROGEN: 63 mg/dL — ABNORMAL HIGH (ref 6–24)
HX BLOOD UREA NITROGEN: 64 mg/dL — ABNORMAL HIGH (ref 6–24)
HX CHLORIDE (CL): 86 meq/L — ABNORMAL LOW (ref 98–110)
HX CHLORIDE (CL): 93 meq/L — ABNORMAL LOW (ref 98–110)
HX CO2: 43 meq/L — AB (ref 20–30)
HX CO2: 47 meq/L — AB (ref 20–30)
HX CREATININE (CR): 1.63 mg/dL — ABNORMAL HIGH (ref 0.57–1.30)
HX CREATININE (CR): 1.61 mg/dL — ABNORMAL HIGH (ref 0.57–1.30)
HX GFR, AFRICAN AMERICAN: 35 mL/min/{1.73_m2} — ABNORMAL LOW
HX GFR, AFRICAN AMERICAN: 36 mL/min/{1.73_m2} — ABNORMAL LOW
HX GFR, NON-AFRICAN AMERICAN: 31 mL/min/{1.73_m2} — ABNORMAL LOW
HX GFR, NON-AFRICAN AMERICAN: 31 mL/min/{1.73_m2} — ABNORMAL LOW
HX GLUCOSE: 154 mg/dL — ABNORMAL HIGH (ref 70–139)
HX GLUCOSE: 249 mg/dL — ABNORMAL HIGH (ref 70–139)
HX POTASSIUM (K): 3 meq/L — ABNORMAL LOW (ref 3.6–5.1)
HX POTASSIUM (K): 3.3 meq/L — ABNORMAL LOW (ref 3.6–5.1)
HX SODIUM (NA): 147 meq/L — ABNORMAL HIGH (ref 135–145)
HX SODIUM (NA): 148 meq/L — ABNORMAL HIGH (ref 135–145)

## 2019-08-19 LAB — HX HEM-ROUTINE
HX BASO #: 0.1 10*3/uL (ref 0.0–0.2)
HX BASO: 0 %
HX EOSIN #: 0.2 10*3/uL (ref 0.0–0.5)
HX EOSIN: 1 %
HX HCT: 37.4 % (ref 32.0–45.0)
HX HGB: 11.8 g/dL (ref 11.0–15.0)
HX IMMATURE GRANULOCYTE#: 0.1 10*3/uL (ref 0.0–0.1)
HX IMMATURE GRANULOCYTE: 0 %
HX LYMPH #: 1.3 10*3/uL (ref 1.0–4.0)
HX LYMPH: 10 %
HX MCH: 29.1 pg (ref 26.0–34.0)
HX MCHC: 31.6 g/dL — ABNORMAL LOW (ref 32.0–36.0)
HX MCV: 92.1 fL (ref 80.0–98.0)
HX MONO #: 1 10*3/uL — ABNORMAL HIGH (ref 0.2–0.8)
HX MONO: 7 %
HX MPV: 11.2 fL (ref 9.1–11.7)
HX NEUT #: 10.9 10*3/uL — ABNORMAL HIGH (ref 1.5–7.5)
HX NRBC #: 0 10*3/uL
HX NUCLEATED RBC: 0 %
HX PLT: 386 10*3/uL (ref 150–400)
HX RBC BLOOD COUNT: 4.06 M/uL (ref 3.70–5.00)
HX RDW: 13.4 % (ref 11.5–14.5)
HX SEG NEUT: 81 %
HX WBC: 13.5 10*3/uL — ABNORMAL HIGH (ref 4.0–11.0)

## 2019-08-19 LAB — HX CHEM-OTHER
HX CALCIUM (CA): 9.9 mg/dL (ref 8.5–10.5)
HX CALCIUM (CA): 9.7 mg/dL (ref 8.5–10.5)
HX MAGNESIUM: 2.4 mg/dL (ref 1.6–2.6)
HX MAGNESIUM: 2.4 mg/dL (ref 1.6–2.6)
HX PHOSPHORUS: 2.5 mg/dL — ABNORMAL LOW (ref 2.7–4.5)
HX PHOSPHORUS: 1.9 mg/dL — ABNORMAL LOW (ref 2.7–4.5)

## 2019-08-20 ENCOUNTER — Ambulatory Visit

## 2019-08-20 LAB — HX CHEM-OTHER
HX ALBUMIN: 2.7 g/dL — ABNORMAL LOW (ref 3.4–4.8)
HX CALCIUM (CA): 9.8 mg/dL (ref 8.5–10.5)
HX CALCIUM (CA): 9 mg/dL (ref 8.5–10.5)
HX MAGNESIUM: 2.3 mg/dL (ref 1.6–2.6)
HX MAGNESIUM: 1.9 mg/dL (ref 1.6–2.6)
HX PHOSPHORUS: 2.9 mg/dL (ref 2.7–4.5)
HX PHOSPHORUS: 2.4 mg/dL — ABNORMAL LOW (ref 2.7–4.5)

## 2019-08-20 LAB — HX HEM-ROUTINE
HX BASO #: 0.1 10*3/uL (ref 0.0–0.2)
HX BASO: 1 %
HX EOSIN #: 0.4 10*3/uL (ref 0.0–0.5)
HX EOSIN: 3 %
HX HCT: 35.3 % (ref 32.0–45.0)
HX HGB: 11 g/dL (ref 11.0–15.0)
HX IMMATURE GRANULOCYTE#: 0.1 10*3/uL (ref 0.0–0.1)
HX IMMATURE GRANULOCYTE: 1 %
HX LYMPH #: 1.6 10*3/uL (ref 1.0–4.0)
HX LYMPH: 15 %
HX MCH: 29.1 pg (ref 26.0–34.0)
HX MCHC: 31.2 g/dL — ABNORMAL LOW (ref 32.0–36.0)
HX MCV: 93.4 fL (ref 80.0–98.0)
HX MONO #: 0.7 10*3/uL (ref 0.2–0.8)
HX MONO: 7 %
HX MPV: 11.3 fL (ref 9.1–11.7)
HX NEUT #: 7.6 10*3/uL — ABNORMAL HIGH (ref 1.5–7.5)
HX NRBC #: 0 10*3/uL
HX NUCLEATED RBC: 0 %
HX PLT: 350 10*3/uL (ref 150–400)
HX RBC BLOOD COUNT: 3.78 M/uL (ref 3.70–5.00)
HX RDW: 13.6 % (ref 11.5–14.5)
HX SEG NEUT: 73 %
HX WBC: 10.3 10*3/uL (ref 4.0–11.0)

## 2019-08-20 LAB — HX CHEM-VITAMIN: HX PREALBUMIN: 14.5 mg/dL (ref 14.0–38.0)

## 2019-08-20 LAB — HX CHEM-PANELS
HX ANION GAP: 6 (ref 3–14)
HX BLOOD UREA NITROGEN: 60 mg/dL — ABNORMAL HIGH (ref 6–24)
HX BLOOD UREA NITROGEN: 55 mg/dL — ABNORMAL HIGH (ref 6–24)
HX CHLORIDE (CL): 99 meq/L (ref 98–110)
HX CHLORIDE (CL): 105 meq/L (ref 98–110)
HX CO2: 42 meq/L — AB (ref 20–30)
HX CO2: 36 meq/L — ABNORMAL HIGH (ref 20–30)
HX CREATININE (CR): 1.33 mg/dL — ABNORMAL HIGH (ref 0.57–1.30)
HX CREATININE (CR): 1.17 mg/dL (ref 0.57–1.30)
HX GFR, AFRICAN AMERICAN: 45 mL/min/{1.73_m2} — ABNORMAL LOW
HX GFR, AFRICAN AMERICAN: 53 mL/min/{1.73_m2} — ABNORMAL LOW
HX GFR, NON-AFRICAN AMERICAN: 39 mL/min/{1.73_m2} — ABNORMAL LOW
HX GFR, NON-AFRICAN AMERICAN: 46 mL/min/{1.73_m2} — ABNORMAL LOW
HX GLUCOSE: 97 mg/dL (ref 70–139)
HX GLUCOSE: 119 mg/dL (ref 70–139)
HX POTASSIUM (K): 3.3 meq/L — ABNORMAL LOW (ref 3.6–5.1)
HX POTASSIUM (K): 3.8 meq/L (ref 3.6–5.1)
HX SODIUM (NA): 148 meq/L — ABNORMAL HIGH (ref 135–145)
HX SODIUM (NA): 147 meq/L — ABNORMAL HIGH (ref 135–145)

## 2019-08-20 LAB — HX POINT OF CARE
HX GLUCOSE-POCT: 101 mg/dL (ref 70–139)
HX GLUCOSE-POCT: 114 mg/dL (ref 70–139)
HX GLUCOSE-POCT: 94 mg/dL (ref 70–139)

## 2019-08-20 LAB — HX CHEM-LIPIDS
HX TRIGLYCERIDES: 52 mg/dL (ref 40–250)
HX TRIGLYCERIDES: 53 mg/dL (ref 40–250)

## 2019-08-20 LAB — HX DIABETES
HX GLUCOSE: 97 mg/dL (ref 70–139)
HX GLUCOSE: 119 mg/dL (ref 70–139)

## 2019-08-20 LAB — HX CHOLESTEROL
HX TRIGLYCERIDES: 52 mg/dL (ref 40–250)
HX TRIGLYCERIDES: 53 mg/dL (ref 40–250)

## 2019-08-21 LAB — HX CHEM-OTHER
HX CALCIUM (CA): 9.3 mg/dL (ref 8.5–10.5)
HX CALCIUM (CA): 8.8 mg/dL (ref 8.5–10.5)
HX MAGNESIUM: 1.9 mg/dL (ref 1.6–2.6)
HX MAGNESIUM: 1.8 mg/dL (ref 1.6–2.6)
HX PHOSPHORUS: 4 mg/dL (ref 2.7–4.5)
HX PHOSPHORUS: 4.2 mg/dL (ref 2.7–4.5)

## 2019-08-21 LAB — HX CHEM-PANELS
HX ANION GAP: 7 (ref 3–14)
HX ANION GAP: 7 (ref 3–14)
HX BLOOD UREA NITROGEN: 53 mg/dL — ABNORMAL HIGH (ref 6–24)
HX BLOOD UREA NITROGEN: 49 mg/dL — ABNORMAL HIGH (ref 6–24)
HX CHLORIDE (CL): 104 meq/L (ref 98–110)
HX CHLORIDE (CL): 107 meq/L (ref 98–110)
HX CO2: 33 meq/L — ABNORMAL HIGH (ref 20–30)
HX CO2: 28 meq/L (ref 20–30)
HX CREATININE (CR): 1.13 mg/dL (ref 0.57–1.30)
HX CREATININE (CR): 1.01 mg/dL (ref 0.57–1.30)
HX GFR, AFRICAN AMERICAN: 55 mL/min/{1.73_m2} — ABNORMAL LOW
HX GFR, AFRICAN AMERICAN: 63 mL/min/{1.73_m2}
HX GFR, NON-AFRICAN AMERICAN: 48 mL/min/{1.73_m2} — ABNORMAL LOW
HX GFR, NON-AFRICAN AMERICAN: 54 mL/min/{1.73_m2} — ABNORMAL LOW
HX GLUCOSE: 65 mg/dL — ABNORMAL LOW (ref 70–139)
HX GLUCOSE: 82 mg/dL (ref 70–139)
HX POTASSIUM (K): 3.5 meq/L — ABNORMAL LOW (ref 3.6–5.1)
HX POTASSIUM (K): 4.5 meq/L (ref 3.6–5.1)
HX SODIUM (NA): 144 meq/L (ref 135–145)
HX SODIUM (NA): 142 meq/L (ref 135–145)

## 2019-08-21 LAB — HX HEM-ROUTINE
HX BASO #: 0.1 10*3/uL (ref 0.0–0.2)
HX BASO: 1 %
HX EOSIN #: 0.5 10*3/uL (ref 0.0–0.5)
HX EOSIN: 6 %
HX HCT: 32.5 % (ref 32.0–45.0)
HX HGB: 10.2 g/dL — ABNORMAL LOW (ref 11.0–15.0)
HX IMMATURE GRANULOCYTE#: 0.2 10*3/uL — ABNORMAL HIGH (ref 0.0–0.1)
HX IMMATURE GRANULOCYTE: 2 %
HX LYMPH #: 2.2 10*3/uL (ref 1.0–4.0)
HX LYMPH: 24 %
HX MCH: 29.1 pg (ref 26.0–34.0)
HX MCHC: 31.4 g/dL — ABNORMAL LOW (ref 32.0–36.0)
HX MCV: 92.9 fL (ref 80.0–98.0)
HX MONO #: 0.6 10*3/uL (ref 0.2–0.8)
HX MONO: 6 %
HX MPV: 11.2 fL (ref 9.1–11.7)
HX NEUT #: 5.9 10*3/uL (ref 1.5–7.5)
HX NRBC #: 0 10*3/uL
HX NUCLEATED RBC: 0 %
HX PLT: 358 10*3/uL (ref 150–400)
HX RBC BLOOD COUNT: 3.5 M/uL — ABNORMAL LOW (ref 3.70–5.00)
HX RDW: 13.4 % (ref 11.5–14.5)
HX SEG NEUT: 61 %
HX WBC: 9.6 10*3/uL (ref 4.0–11.0)

## 2019-08-21 LAB — HX POINT OF CARE
HX GLUCOSE-POCT: 116 mg/dL (ref 70–139)
HX GLUCOSE-POCT: 133 mg/dL (ref 70–139)
HX GLUCOSE-POCT: 179 mg/dL — ABNORMAL HIGH (ref 70–139)
HX GLUCOSE-POCT: 52 mg/dL — ABNORMAL LOW (ref 70–139)
HX GLUCOSE-POCT: 61 mg/dL — ABNORMAL LOW (ref 70–139)
HX GLUCOSE-POCT: 71 mg/dL (ref 70–139)
HX GLUCOSE-POCT: 89 mg/dL (ref 70–139)
HX GLUCOSE-POCT: 91 mg/dL (ref 70–139)
HX GLUCOSE-POCT: 97 mg/dL (ref 70–139)

## 2019-08-21 LAB — HX DIABETES
HX GLUCOSE: 65 mg/dL — ABNORMAL LOW (ref 70–139)
HX GLUCOSE: 82 mg/dL (ref 70–139)

## 2019-08-22 LAB — HX HEM-ROUTINE
HX BASO #: 0.1 10*3/uL (ref 0.0–0.2)
HX BASO: 1 %
HX EOSIN #: 0.5 10*3/uL (ref 0.0–0.5)
HX EOSIN: 6 %
HX HCT: 29.5 % — ABNORMAL LOW (ref 32.0–45.0)
HX HGB: 9.5 g/dL — ABNORMAL LOW (ref 11.0–15.0)
HX IMMATURE GRANULOCYTE#: 0.2 10*3/uL — ABNORMAL HIGH (ref 0.0–0.1)
HX IMMATURE GRANULOCYTE: 3 %
HX LYMPH #: 1.6 10*3/uL (ref 1.0–4.0)
HX LYMPH: 20 %
HX MCH: 29.7 pg (ref 26.0–34.0)
HX MCHC: 32.2 g/dL (ref 32.0–36.0)
HX MCV: 92.2 fL (ref 80.0–98.0)
HX MONO #: 0.6 10*3/uL (ref 0.2–0.8)
HX MONO: 7 %
HX MPV: 11.4 fL (ref 9.1–11.7)
HX NEUT #: 5.2 10*3/uL (ref 1.5–7.5)
HX NRBC #: 0 10*3/uL
HX NUCLEATED RBC: 0 %
HX PLT: 308 10*3/uL (ref 150–400)
HX RBC BLOOD COUNT: 3.2 M/uL — ABNORMAL LOW (ref 3.70–5.00)
HX RDW: 13.5 % (ref 11.5–14.5)
HX SEG NEUT: 63 %
HX WBC: 8.2 10*3/uL (ref 4.0–11.0)

## 2019-08-22 LAB — HX POINT OF CARE
HX GLUCOSE-POCT: 71 mg/dL (ref 70–139)
HX GLUCOSE-POCT: 92 mg/dL (ref 70–139)
HX GLUCOSE-POCT: 95 mg/dL (ref 70–139)
HX GLUCOSE-POCT: 95 mg/dL (ref 70–139)
HX GLUCOSE-POCT: 115 mg/dL (ref 70–139)

## 2019-08-22 LAB — HX CHEM-PANELS
HX ANION GAP: 7 (ref 3–14)
HX BLOOD UREA NITROGEN: 41 mg/dL — ABNORMAL HIGH (ref 6–24)
HX CHLORIDE (CL): 106 meq/L (ref 98–110)
HX CO2: 29 meq/L (ref 20–30)
HX CREATININE (CR): 0.93 mg/dL (ref 0.57–1.30)
HX GFR, AFRICAN AMERICAN: 70 mL/min/{1.73_m2}
HX GFR, NON-AFRICAN AMERICAN: 60 mL/min/{1.73_m2}
HX GLUCOSE: 68 mg/dL — ABNORMAL LOW (ref 70–139)
HX POTASSIUM (K): 3.8 meq/L (ref 3.6–5.1)
HX SODIUM (NA): 142 meq/L (ref 135–145)

## 2019-08-22 LAB — HX CHEM-OTHER
HX CALCIUM (CA): 8.6 mg/dL (ref 8.5–10.5)
HX MAGNESIUM: 2.1 mg/dL (ref 1.6–2.6)
HX PHOSPHORUS: 4.6 mg/dL — ABNORMAL HIGH (ref 2.7–4.5)

## 2019-08-22 LAB — HX DIABETES: HX GLUCOSE: 68 mg/dL — ABNORMAL LOW (ref 70–139)

## 2019-08-23 LAB — HX HEM-ROUTINE
HX BASO #: 0.1 10*3/uL (ref 0.0–0.2)
HX BASO: 1 %
HX EOSIN #: 0.3 10*3/uL (ref 0.0–0.5)
HX EOSIN: 4 %
HX HCT: 28.7 % — ABNORMAL LOW (ref 32.0–45.0)
HX HGB: 9.2 g/dL — ABNORMAL LOW (ref 11.0–15.0)
HX IMMATURE GRANULOCYTE#: 0.2 10*3/uL — ABNORMAL HIGH (ref 0.0–0.1)
HX IMMATURE GRANULOCYTE: 3 %
HX LYMPH #: 1.4 10*3/uL (ref 1.0–4.0)
HX LYMPH: 18 %
HX MCH: 29.2 pg (ref 26.0–34.0)
HX MCHC: 32.1 g/dL (ref 32.0–36.0)
HX MCV: 91.1 fL (ref 80.0–98.0)
HX MONO #: 0.5 10*3/uL (ref 0.2–0.8)
HX MONO: 7 %
HX MPV: 11.4 fL (ref 9.1–11.7)
HX NEUT #: 5.2 10*3/uL (ref 1.5–7.5)
HX NRBC #: 0 10*3/uL
HX NUCLEATED RBC: 0 %
HX PLT: 297 10*3/uL (ref 150–400)
HX RBC BLOOD COUNT: 3.15 M/uL — ABNORMAL LOW (ref 3.70–5.00)
HX RDW: 13.6 % (ref 11.5–14.5)
HX SEG NEUT: 68 %
HX WBC: 7.7 10*3/uL (ref 4.0–11.0)

## 2019-08-23 LAB — HX CHEM-PANELS
HX ANION GAP: 5 (ref 3–14)
HX BLOOD UREA NITROGEN: 37 mg/dL — ABNORMAL HIGH (ref 6–24)
HX CHLORIDE (CL): 108 meq/L (ref 98–110)
HX CO2: 28 meq/L (ref 20–30)
HX CREATININE (CR): 0.93 mg/dL (ref 0.57–1.30)
HX GFR, AFRICAN AMERICAN: 70 mL/min/{1.73_m2}
HX GFR, NON-AFRICAN AMERICAN: 60 mL/min/{1.73_m2}
HX GLUCOSE: 138 mg/dL (ref 70–139)
HX POTASSIUM (K): 4.3 meq/L (ref 3.6–5.1)
HX SODIUM (NA): 141 meq/L (ref 135–145)

## 2019-08-23 LAB — HX CHEM-OTHER
HX CALCIUM (CA): 8.5 mg/dL (ref 8.5–10.5)
HX MAGNESIUM: 1.9 mg/dL (ref 1.6–2.6)
HX PHOSPHORUS: 4.2 mg/dL (ref 2.7–4.5)

## 2019-08-23 LAB — HX DIABETES: HX GLUCOSE: 138 mg/dL (ref 70–139)

## 2019-08-23 LAB — HX POINT OF CARE
HX GLUCOSE-POCT: 139 mg/dL (ref 70–139)
HX GLUCOSE-POCT: 160 mg/dL — ABNORMAL HIGH (ref 70–139)
HX GLUCOSE-POCT: 165 mg/dL — ABNORMAL HIGH (ref 70–139)
HX GLUCOSE-POCT: 98 mg/dL (ref 70–139)

## 2019-08-24 ENCOUNTER — Ambulatory Visit: Admitting: Student in an Organized Health Care Education/Training Program

## 2019-08-24 LAB — HX CHEM-PANELS
HX ANION GAP: 10 (ref 3–14)
HX BLOOD UREA NITROGEN: 43 mg/dL — ABNORMAL HIGH (ref 6–24)
HX CHLORIDE (CL): 103 meq/L (ref 98–110)
HX CO2: 32 meq/L — ABNORMAL HIGH (ref 20–30)
HX CREATININE (CR): 0.96 mg/dL (ref 0.57–1.30)
HX GFR, AFRICAN AMERICAN: 67 mL/min/{1.73_m2}
HX GFR, NON-AFRICAN AMERICAN: 58 mL/min/{1.73_m2} — ABNORMAL LOW
HX GLUCOSE: 98 mg/dL (ref 70–139)
HX POTASSIUM (K): 3.6 meq/L (ref 3.6–5.1)
HX SODIUM (NA): 145 meq/L (ref 135–145)

## 2019-08-24 LAB — HX POINT OF CARE
HX GLUCOSE-POCT: 124 mg/dL (ref 70–139)
HX GLUCOSE-POCT: 160 mg/dL — ABNORMAL HIGH (ref 70–139)

## 2019-08-24 LAB — HX CHEM-OTHER
HX CALCIUM (CA): 9.3 mg/dL (ref 8.5–10.5)
HX MAGNESIUM: 2 mg/dL (ref 1.6–2.6)
HX PHOSPHORUS: 4.3 mg/dL (ref 2.7–4.5)

## 2019-08-24 LAB — HX DIABETES: HX GLUCOSE: 98 mg/dL (ref 70–139)

## 2019-08-24 MED FILL — ONDANSETRON 4 MG TAB ODT: 7 days supply | Qty: 20 | Fill #0 | Status: AC

## 2019-08-24 MED FILL — PROCHLORPERAZINE 10 MG TAB: 5 days supply | Qty: 20 | Fill #0 | Status: AC

## 2019-08-24 MED FILL — oxyCODONE HCL 5 MG TABS: 1 days supply | Qty: 5 | Fill #0 | Status: AC

## 2019-08-24 NOTE — Progress Notes (Signed)
* * *      Forton, Daylin **DOB:** 22-Mar-1946 [phone] F) **Acc No.** [id number] **DOS:**  08/24/2019    ---       [name], [name]**    ------    68 Y old Female, DOB: 1945/07/05    [address], [place], [place] [address]    Home: [phone]    Provider: Elon Jester        * * *    Telephone Encounter    ---    Answered by  Elon Jester Date: 08/24/2019       Time: 10:21 AM    Reason  Follow up appt    ------            Message                     Cleone Slim Raynelle Fanning!      Ms. Martine was discharged home on 08/11/19 with TPN + 65 units of insulin in bag. She was recently admitted for blood glucose >500. She did very good with 56 units of insulin in TPN bag. She has a follow up appt. with you tomorrow. Patient's son will take the call. His name is Kathlene November and his phone number is [phone]. Patient is schedule for Whipple surgery on 08/30/19.                 Action Taken                     HOWE,JULIE  08/25/2019 11:18:06 AM > see visit note, called and spoke with pts son and daughter in law today.                    * * *                ---          * * *         Provider: Elon Jester 08/24/2019    ---    Note generated by eClinicalWorks EMR/PM Software ([email or web address])

## 2019-08-25 ENCOUNTER — Ambulatory Visit: Admitting: Adult Health

## 2019-08-25 ENCOUNTER — Ambulatory Visit: Admitting: "Endocrinology

## 2019-08-25 ENCOUNTER — Ambulatory Visit: Admit: 2019-08-25 | Payer: 59

## 2019-08-25 NOTE — Progress Notes (Signed)
 * * *    [name], [name] **DOB:** 06/13/1946 [phone] yo F) **Acc No.** [id number] **DOS:**  08/25/2019    ---       [name], [name]**    ------    74 Y old Female, DOB: April 26, 1946, External MRN: [id number]    Account Number: [id number]    [address], [place]    Home: [phone]    Insurance: Ollen Gross    PCP: Sherrill Raring, MD Referring: Sherrill Raring, MD    Appointment Facility: Endocrinology        * * *    08/25/2019  **Appointment Provider:** Dawna Part, NP **CHN#:** [id number]    ------    ---       **Reason for Appointment**    ---      1\. T2DM    ---      **History of Present Illness**    ---     _General_ :    The patient encounter today occurred via Telehealth with the patient's verbal  consent.    The reason for the tele-health visit was due to the COVID-19 pandemic crisis /  federally declared state of public health emergency, and need for social  distancing. The patient denies having any known contacts with people diagnosed  with COVID-19 or any signs of active infection with COVID-19. The patient is  reasonably trying to maintain social distancing measures.    .    Method of Telehealth used was audio    .    Physical location of the patient was at home.    Marland Kitchen    Physical location of the provider was at home.    _ __ _    74 y/o female with a history of HTN, T2DM, HLD, multifocal atrial tachycardia,  cholecystititis s/p cholecystectomy (2017) c/b biliary stricture, chronic  pancreatitis and duodental stricture. 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 Ms Ashmore was  recently hospitalized here at [facility] from 3/2-08/24/19 for hyperglycemia. She was  managed by the inpatient Endocrine team with lantus and humalog. She was NPO  during this hospitalization and has only been able to take small sips of  water/ice. Discharged home on TPN with 56 units of Humulin R into her TPN bag.    Since d/c home pt's son and daughter in law note: TPN is  being cycled over 20  hrs. [facility] is delivering pt's TPN daily. No VNA RN at this  time.    Plans to have an appointment with Dr Derrill Kay on Friday to discuss plans for  surgery.    Diabetes Meds:    Humulin R- 56 units injected into TPN bag (cycled x20 hrs).    A1c Trend:    08/10/19-----7.7%.    Recent Blood Glucoses:    random bg readings~q 6hrs: 259, 325, 309, 311.    Diet:    NPO, small sips of noncaloric fluid (water or ice).    Exercise:    deconditioned, minimal activity, requires assistance with movement.     **Current Medications**    ---    Taking    * Calcium Carb-Cholecalciferol 500-200 MG-UNIT Tablet 1 capsule with a meal Orally twice daily    ---    * Cyanocobalamin 1000 MCG Tablet as directed Orally Once a day    ---    * Doxazosin Mesylate 1 MG Tablet 1 tablet Orally Once a day    ---    *  Famotidine 20 MG Tablet 1 tablet Orally twice daily    ---    * Folic Acid 400 MCG Tablet 1 tablet Orally daily    ---    * Humulin R 100 UNIT/ML Solution 56 units Injection in TPN bag    ---    * Magnesium Oxide 250 MG Tablet as directed Orally once daily    ---    * Metoprolol Tartrate 50 MG Tablet as directed Orally Twice a day    ---    * Ondansetron HCl 4 MG Tablet 1 tablet as needed Orally every 8 hrs    ---    * Oxycodone HCl 5 MG Tablet as directed Orally every 6 hrs    ---    * Prochlorperazine Maleate 10 MG Tablet 1 tablet as needed Orally every 6 hrs as needed    ---    * Medication List reviewed and reconciled with the patient    ---      **Past Medical History**    ---      T2DM.        ---    HTN.        ---    HLD.        ---    Multifocal atrial tachycardia.        ---    Chronic pancreatitis and duodenal stricture.        ---    Malignant pancreatic head lesion.        ---      **Social History**    ---      lives with son and daughter in law-providing care.    ---      **Review of Systems**    ---    chronic nausea and vomiting, generalized weakness and orthostasis    no  polyuria or polydipsia, fevers.      **Physical Examination**    ---    no PE, counseled for 30 minutes/pt's family members.      **Assessments**    ---    1\. Type 2 diabetes mellitus with hyperglycemia - E11.65 (Primary)    ---    2\. Long term (current) use of insulin - Z79.4    ---    3\. Essential hypertension - I10    ---    4\. Hyperlipidemia, unspecified hyperlipidemia type - E78.5    ---     Ms Reddington has T2DM with recent diagnosis of malignant pancreatic head mass.  She was treated at [facility] from 3/2-3/9 for hyperglycemia and managed by the  inpatient Endocrine team. Her hemoglobin a1c was 7.7% at admission. Ultimately  she was discharged home on TPN, being cycled x20 hrs/day with 56 units of  Humulin R added to her TPN bag. She remains mostly NPO with only sips of ice  and water at this time. Her family notes that she has continued to have dry  heaving and vomiting since her discharge yesterday. They plan to have a visit  with Dr Derrill Kay this Friday to discuss surgical treatment options for her  pancreatic head mass.    By review today Ms Araiza's blood glucose readings have been ranging in the  200-300 range since being discharged home. This is substantially higher than  even yesterday while still in the hospital, when bg readings were in the  100's. Family verifies she is not taking in any caloric liquids.    I will increase her Humulin R dose from 56 units to  60 units per TPN bag. I  have called and ordered this through the pharmacy [facility].    Plan for f/u on BG in 2 days by telephone.    Pt currently having issues with orthostatic hypotension. Family is holding  metoprolol and doxazosin doses, plans to have a TL visit with PCP this week.    ---      **Treatment**    ---      **1\. Type 2 diabetes mellitus with hyperglycemia**    Increase Humulin R Solution, 100 UNIT/ML, 60 UNITS, Injection, in TPN bag    ---      **Procedure Codes**    ---      [id number] PHONE E/M BY PHYS 21-30 MIN, Modifiers:  GT , SA    ---    73220 PROL CARE 1ST HR W/O CONTACT    ---      **Follow Up**    ---    08/27/19    **Appointment Provider:** Dawna Part, NP    Electronically signed by Dawna Part , NP on 08/27/2019 at 03:12 PM EST    Sign off status: Completed        * * *        Endocrinology    [address]    [place], [address]    [place], [place] [address]    Tel: [phone]    Fax: [phone]              * * *          Progress Note: Dawna Part, NP 08/25/2019    ---    Note generated by eClinicalWorks EMR/PM Software ([email or web address])

## 2019-08-25 NOTE — Progress Notes (Signed)
 .  Progress Notes  .  Patient: [name]  Provider: Dawna Part  DOB: 1945-07-23 Age: 74 Y Sex: Female  .  PCP: Sherrill Raring  MD  Date: 08/25/2019  .  --------------------------------------------------------------------------------  .  REASON FOR APPOINTMENT  .  1. T2DM  .  HISTORY OF PRESENT ILLNESS  .  General:  The patient encounter today occurred via  Telehealth with the patient's verbal consent. The reason for the  tele-health visit was due to the COVID-19 pandemic crisis /  federally declared state of public health emergency, and need for  social distancing. The patient denies having any known contacts  with people diagnosed with COVID-19 or any signs of active  infection with COVID-19. The patient is reasonably trying to  maintain social distancing measures. . Method of Telehealth used  was audio . Physical location of the patient was at home. Marland Kitchen  Physical location of the provider was at home._ __ _74 y/o female  with a history of HTN, T2DM, HLD, multifocal atrial tachycardia,  cholecystititis s/p cholecystectomy (2017) c/b biliary stricture,  chronic pancreatitis and duodental stricture. She was found to  have a malignant pancreatic head lesion with plans for a whipple  procedure. [name] was recently hospitalized here at [facility]  from 3/2-08/24/19 for hyperglycemia. She was managed by the  inpatient Endocrine team with lantus and humalog. She was NPO  during this hospitalization and has only been able to take small  sips of water/ice. Discharged home on TPN with 56 units of  Humulin R into her TPN bag.Since d/c home pt's son and daughter  in law note: TPN is being cycled over 20 hrs. [facility] is delivering pt's TPN daily. No VNA RN at this  time.Plans to have an appointment with Dr Derrill Kay on Friday to  discuss plans for surgery.  Diabetes Meds:  .  Humulin R- 56 units injected into TPN bag (cycled x20 hrs).  .  A1c Trend:  .  08/10/19-----7.7%.  Marland Kitchen  Recent Blood Glucoses:  .  random  bg readings  q 6hrs: 259, 325, 309, 311.  .  Diet:  .  NPO, small sips of noncaloric fluid (water or ice).  .  Exercise:  .  deconditioned, minimal activity, requires assistance with  movement.  .  CURRENT MEDICATIONS  .  Taking Calcium Carb-Cholecalciferol 500-200 MG-UNIT Tablet 1  capsule with a meal Orally twice daily  Taking Cyanocobalamin 1000 MCG Tablet as directed Orally Once a  day  Taking Doxazosin Mesylate 1 MG Tablet 1 tablet Orally Once a day  Taking Famotidine 20 MG Tablet 1 tablet Orally twice daily  Taking Folic Acid 400 MCG Tablet 1 tablet Orally daily  Taking Humulin R 100 UNIT/ML Solution 56 units Injection in TPN  bag  Taking Magnesium Oxide 250 MG Tablet as directed Orally once  daily  Taking Metoprolol Tartrate 50 MG Tablet as directed Orally Twice  a day  Taking Ondansetron HCl 4 MG Tablet 1 tablet as needed Orally  every 8 hrs  Taking Oxycodone HCl 5 MG Tablet as directed Orally every 6 hrs  Taking Prochlorperazine Maleate 10 MG Tablet 1 tablet as needed  Orally every 6 hrs as needed  Medication List reviewed and reconciled with the patient  .  PAST MEDICAL HISTORY  .  T2DM  HTN  HLD  Multifocal atrial tachycardia  Chronic pancreatitis and duodenal stricture  Malignant pancreatic head lesion  .  ALLERGIES  .  yes[Allergies Verified]  .  SOCIAL HISTORY  .  lives with son and daughter in law-providing care.  Marland Kitchen  REVIEW OF SYSTEMS  .  chronic nausea and vomiting, generalized weakness and  orthostasisno polyuria or polydipsia, fevers.  .  PHYSICAL EXAMINATION  .  no PE, counseled for 30 minutes/pt's family members.  .  ASSESSMENTS  .  Type 2 diabetes mellitus with hyperglycemia - E11.65 (Primary)  .  Long term (current) use of insulin - Z79.4  .  Essential hypertension - I10  .  Hyperlipidemia, unspecified hyperlipidemia type - E78.5  .  [name] has T2DM with recent diagnosis of malignant pancreatic  head mass. She was treated at [facility] from 3/2-3/9 for hyperglycemia  and managed by the inpatient  Endocrine team. Her hemoglobin a1c  was 7.7% at admission. Ultimately she was discharged home on TPN,  being cycled x20 hrs/day with 56 units of Humulin R added to her  TPN bag. She remains mostly NPO with only sips of ice and water  at this time. Her family notes that she has continued to have dry  heaving and vomiting since her discharge yesterday. They plan to  have a visit with Dr Derrill Kay this Friday to discuss surgical  treatment options for her pancreatic head mass.By review today [name]  [name]'s blood glucose readings have been ranging in the 200-300  range since being discharged home. This is substantially higher  than even yesterday while still in the hospital, when bg readings  were in the 100's. Family verifies she is not taking in any  caloric liquids. I will increase her Humulin R dose from 56 units  to 60 units per TPN bag. I have called and ordered this through  the pharmacy [facility].Plan for f/u on BG in 2 days by  telephone. Pt currently having issues with orthostatic  hypotension. Family is holding metoprolol and doxazosin doses,  plans to have a TL visit with PCP this week.  .  TREATMENT  .  Type 2 diabetes mellitus with hyperglycemia  Increase Humulin R Solution, 100 UNIT/ML, 60 UNITS, Injection, in  TPN bag  .  PROCEDURE CODES  .  17209 PHONE E/M BY PHYS 21-30 MIN, Modifiers: GT , SA  .  99358 PROL CARE 1ST HR W/O CONTACT  .  FOLLOW UP  .  08/27/19  .  Marland Kitchen  Appointment Provider: Dawna Part, NP  .  Electronically signed by Dawna Part , NP on  08/27/2019 at 03:12 PM EST  .  Document electronically signed by Dawna Part    .

## 2019-08-25 NOTE — Progress Notes (Signed)
* * *      Mcbain, Alasia **DOB:** 06/15/1946 [phone] yo F) **Acc No.** [id number] **DOS:**  08/25/2019    ---       [name], [name]**    ------    28 Y old Female, DOB: 1946-02-02    [address] [place], [place] [address]    Home: [phone]    Provider: Dawna Part        * * *    Telephone Encounter    ---    Answered by  Dawna Part Date: 08/25/2019       Time: 11:59 AM    Reason  f/u    ------            Message                     Mayme Genta, Please add on for friday 3/12. I'll be calling the pt's family again, they're already aware so no need to call them. Thanks                Action Taken                     [facility]  08/25/2019 5:42:57 PM > thanks, added to beginning of schedule on 08/27/19 at 7:40 am. TL.                    * * *                ---          * * *         Provider: Dawna Part 08/25/2019    ---    Note generated by eClinicalWorks EMR/PM Software ([email or web address])

## 2019-08-26 ENCOUNTER — Ambulatory Visit: Admitting: Adult Health

## 2019-08-26 NOTE — Progress Notes (Signed)
* * *      Felling, Shonique **DOB:** [birth year] (74 yo F) **Acc No.** [id number] **DOS:**  08/26/2019    ---       [name], [name]**    ------    57 Y old Female, DOB: 01/23/1946    [address] [place], [place] [address]    Home: [phone]    Provider: Dawna Part        * * *    Telephone Encounter    ---    Answered by  Justus Memory Date: 08/26/2019       Time: 03:31 PM    Caller  [name]    ------            Reason  Call back            Message                     Patient's daughter in law wants to update Raynelle Fanning on patient's sugar levels, at 1:45am 283, 8:30am 230      and 2:45pm 203. She stated patient has been doing so much better and wanted to check if Raynelle Fanning wanted to keep the same treatment or if she has any changes. Call back # is [phone].                Action Taken                     Enamorado,Jennifer  08/26/2019 3:31:28 PM >      Agostino Gorin  08/27/2019 9:12:21 AM > spoke to pt's daughter in law-see TL encounter for today.                    * * *                ---          * * *         Provider: Dawna Part 08/26/2019    ---    Note generated by eClinicalWorks EMR/PM Software ([email or web address])

## 2019-08-27 ENCOUNTER — Ambulatory Visit: Admitting: Surgery

## 2019-08-27 ENCOUNTER — Ambulatory Visit: Admitting: "Endocrinology

## 2019-08-27 ENCOUNTER — Ambulatory Visit: Admitting: Adult Health

## 2019-08-27 ENCOUNTER — Ambulatory Visit: Admit: 2019-08-27 | Payer: 59

## 2019-08-27 NOTE — Progress Notes (Signed)
 .  Progress Notes  .  Patient: [name]  Provider: Dawna Part  DOB: August 18, 1945 Age: 74 Y Sex: Female  .  PCP: Sherrill Raring  MD  Date: 08/27/2019  .  --------------------------------------------------------------------------------  .  REASON FOR APPOINTMENT  .  1. T2DM  .  HISTORY OF PRESENT ILLNESS  .  General:  The patient encounter today occurred via  Telehealth with the patient's verbal consent. The reason for the  tele-health visit was due to the COVID-19 pandemic crisis /  federally declared state of public health emergency, and need for  social distancing. The patient denies having any known contacts  with people diagnosed with COVID-19 or any signs of active  infection with COVID-19. The patient is reasonably trying to  maintain social distancing measures. . Method of Telehealth used  was audio . Physical location of the patient was at home. Marland Kitchen  Physical location of the provider was at home._ __ _73 y/o female  with a history of HTN, T2DM, HLD, multifocal atrial tachycardia,  cholecystititis s/p cholecystectomy (2017) c/b biliary stricture,  chronic pancreatitis and duodental stricture. She was found to  have a malignant pancreatic head lesion with plans for a whipple  procedure. Ms Griffith was recently hospitalized here at [facility]  from 3/2-08/24/19 for hyperglycemia. She was managed by the  inpatient Endocrine team with lantus and humalog. She was NPO  during this hospitalization and has only been able to take small  sips of water/ice. Discharged home on TPN with 56 units of  Humulin R into her TPN bag.Since d/c home pt's son and daughter  in law note: TPN is being cycled over 20 hrs. [facility] is delivering pt's TPN daily. No VNA RN at this time._  __ _-Interim hxSpoke to pt and family 2 days ago. BG readings are  "back at baseline" since increasing Humulin R to 60 units in her  TPN back.Pt's family notes that typical bg for Ms Uffelman is  usually in the low 200;s. Shes still dealing  with a lot of  orthostasis with movement. Unable to make it into  for her  visit with Dr Derrill Kay, will be telehealth instead.BG readings for  the past day: 283, 230, 203, 183, 152.  Diabetes Meds:  .  Humulin R- 60 units injected into TPN bag (cycled x20 hrs).  .  A1c Trend:  .  08/10/19-----7.7%.  Marland Kitchen  Recent Blood Glucoses:  .  random bg readings  q 6hrs: 259, 325, 309, 311.  .  Diet:  .  NPO, small sips of noncaloric fluid (water or ice).  .  Exercise:  .  deconditioned, minimal activity, requires assistance with  movement.  .  CURRENT MEDICATIONS  .  Taking Calcium Carb-Cholecalciferol 500-200 MG-UNIT Tablet 1  capsule with a meal Orally twice daily  Taking Cyanocobalamin 1000 MCG Tablet as directed Orally Once a  day  Taking Doxazosin Mesylate 1 MG Tablet 1 tablet Orally Once a day  Taking Famotidine 20 MG Tablet 1 tablet Orally twice daily  Taking Folic Acid 400 MCG Tablet 1 tablet Orally daily  Taking Humulin R 100 UNIT/ML Solution 60 UNITS Injection in TPN  bag  Taking Magnesium Oxide 250 MG Tablet as directed Orally once  daily  Taking Metoprolol Tartrate 50 MG Tablet as directed Orally Twice  a day  Taking Ondansetron HCl 4 MG Tablet 1 tablet as needed Orally  every 8 hrs  Taking Oxycodone HCl 5 MG  Tablet as directed Orally every 6 hrs  Taking Prochlorperazine Maleate 10 MG Tablet 1 tablet as needed  Orally every 6 hrs as needed  .  PAST MEDICAL HISTORY  .  T2DM  HTN  HLD  Multifocal atrial tachycardia  Chronic pancreatitis and duodenal stricture  Malignant pancreatic head lesion  .  ALLERGIES  .  yes[Allergies Verified]  .  REVIEW OF SYSTEMS  .  chronic nausea and vomiting, generalized weakness and  orthostasisno polyuria or polydipsia, fevers.  .  PHYSICAL EXAMINATION  .  no PE, counseled for 15 minutes/pt's family members.  .  ASSESSMENTS  .  Type 2 diabetes mellitus with hyperglycemia - E11.65 (Primary)  .  Long term (current) use of insulin - Z79.4  .  Essential hypertension -  I10  .  Hyperlipidemia, unspecified hyperlipidemia type - E78.5  .  Ms Seiler has T2DM with recent diagnosis of malignant pancreatic  head mass. She was treated at [facility] from 3/2-3/9 for hyperglycemia  and managed by the inpatient Endocrine team. Her hemoglobin a1c  was 7.7% at admission. Ultimately she was discharged home on TPN,  being cycled x20 hrs/day with 60 units of Humulin R added to her  TPN bag. She remains mostly NPO with only sips of ice and water  at this time. Family feels bg readings are improved with the  modest increase in Humulin R to her TPN bag. I have reviewed  available bg readings. I will continue 60 units of Humulin  R.--continue bg checks q6hrs--f/u TBD. Per family pt will  tentatively be having surgery next week and will wait on  scheduling a follow up until after this. Provided contact to pt's  daughter in law to reach out to me for concerns and questions in  the interim.  .  TREATMENT  .  Type 2 diabetes mellitus with hyperglycemia  Continue Humulin R Solution, 100 UNIT/ML, 60 UNITS, Injection, in  TPN bag  .  PROCEDURE CODES  .  [id number] PHONE E/M BY PHYS 11-20 MIN, Modifiers: GT , SA  .  FOLLOW UP  .  TBD post operatively  .  Marland Kitchen  Appointment Provider: Dawna Part, NP  .  Electronically signed by Dawna Part , NP on  08/27/2019 at 03:11 PM EST  .  Document electronically signed by Dawna Part    .

## 2019-08-27 NOTE — Progress Notes (Signed)
* * *      Gossen, Pearlee **DOB:** 1945/12/29 (74 yo F) **Acc No.** [id number] **DOS:**  08/27/2019    ---       [name], [name]**    ------    38 Y old Female, DOB: [birth year]    [address] [place], [place] [address]    Home: [phone]    Provider: Dawna Part        * * *    Telephone Encounter    ---    Answered by  Justus Memory Date: 08/27/2019       Time: 11:49 AM    Caller  daughter in law    ------            Reason  Sugar levels            Message                     [name] wanted to let Raynelle Fanning know her recent sugar levels:      yesterday 8:55pm 328      today 4:55am 183      today 9:30am 152            [name]'s # [phone]                Action Taken                     Enamorado,Jennifer  08/27/2019 11:49:38 AM >      Adeyemi Hamad  08/27/2019 12:44:28 PM > noted, per my discussion with daughter in law today these readings are stable for the patient.                    * * *                ---          * * *         Provider: Dawna Part 08/27/2019    ---    Note generated by eClinicalWorks EMR/PM Software ([email or web address])

## 2019-08-27 NOTE — Progress Notes (Signed)
 * * *    [name], [name] **DOB:** [birth year] [phone] yo F) **Acc No.** [id number] **DOS:**  08/27/2019    ---       [name], [name]**    ------    18 Y old Female, DOB: 1945-11-18, External MRN: [id number]    Account Number: [id number]    [address], [place]    Home: [phone]    Insurance: Ollen Gross    PCP: Sherrill Raring, MD Referring: Sherrill Raring, MD    Appointment Facility: Endocrinology        * * *    08/27/2019  **Appointment Provider:** Dawna Part, NP **CHN#:** [id number]    ------    ---       **Reason for Appointment**    ---      1\. T2DM    ---      **History of Present Illness**    ---     _General_ :    The patient encounter today occurred via Telehealth with the patient's verbal  consent.    The reason for the tele-health visit was due to the COVID-19 pandemic crisis /  federally declared state of public health emergency, and need for social  distancing. The patient denies having any known contacts with people diagnosed  with COVID-19 or any signs of active infection with COVID-19. The patient is  reasonably trying to maintain social distancing measures.    .    Method of Telehealth used was audio    .    Physical location of the patient was at home.    Marland Kitchen    Physical location of the provider was at home.    _ __ _    74 y/o female with a history of HTN, T2DM, HLD, multifocal atrial tachycardia,  cholecystititis s/p cholecystectomy (2017) c/b biliary stricture, chronic  pancreatitis and duodental stricture. 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 [name] was  recently hospitalized here at [facility] from 3/2-08/24/19 for hyperglycemia. She was  managed by the inpatient Endocrine team with lantus and humalog. She was NPO  during this hospitalization and has only been able to take small sips of  water/ice. Discharged home on TPN with 56 units of Humulin R into her TPN bag.    Since d/c home pt's son and daughter in law note: TPN is  being cycled over 20  hrs. [facility] is delivering pt's TPN daily. No VNA RN at this  time.    _ __ _-    Interim hx    Spoke to pt and family 2 days ago. BG readings are "back at baseline" since  increasing Humulin R to 60 units in her TPN back.    Pt's family notes that typical bg for [name] is usually in the low 200;s.    Shes still dealing with a lot of orthostasis with movement. Unable to make it  into [place] for her visit with Dr Derrill Kay, will be telehealth instead.    BG readings for the past day: 283, 230, 203, 183, 152.    Diabetes Meds:    Humulin R- 60 units injected into TPN bag (cycled x20 hrs).    A1c Trend:    08/10/19-----7.7%.    Recent Blood Glucoses:    random bg readings~q 6hrs: 259, 325, 309, 311.    Diet:    NPO, small sips of noncaloric fluid (water or ice).    Exercise:  deconditioned, minimal activity, requires assistance with movement.     **Current Medications**    ---    Taking    * Calcium Carb-Cholecalciferol 500-200 MG-UNIT Tablet 1 capsule with a meal Orally twice daily    ---    * Cyanocobalamin 1000 MCG Tablet as directed Orally Once a day    ---    * Doxazosin Mesylate 1 MG Tablet 1 tablet Orally Once a day    ---    * Famotidine 20 MG Tablet 1 tablet Orally twice daily    ---    * Folic Acid 400 MCG Tablet 1 tablet Orally daily    ---    * Humulin R 100 UNIT/ML Solution 60 UNITS Injection in TPN bag    ---    * Magnesium Oxide 250 MG Tablet as directed Orally once daily    ---    * Metoprolol Tartrate 50 MG Tablet as directed Orally Twice a day    ---    * Ondansetron HCl 4 MG Tablet 1 tablet as needed Orally every 8 hrs    ---    * Oxycodone HCl 5 MG Tablet as directed Orally every 6 hrs    ---    * Prochlorperazine Maleate 10 MG Tablet 1 tablet as needed Orally every 6 hrs as needed    ---      **Past Medical History**    ---      T2DM.        ---    HTN.        ---    HLD.        ---    Multifocal atrial tachycardia.        ---    Chronic pancreatitis and  duodenal stricture.        ---    Malignant pancreatic head lesion.        ---      **Review of Systems**    ---    chronic nausea and vomiting, generalized weakness and orthostasis    no polyuria or polydipsia, fevers.      **Physical Examination**    ---    no PE, counseled for 15 minutes/pt's family members.      **Assessments**    ---    1\. Type 2 diabetes mellitus with hyperglycemia - E11.65 (Primary)    ---    2\. Long term (current) use of insulin - Z79.4    ---    3\. Essential hypertension - I10    ---    4\. Hyperlipidemia, unspecified hyperlipidemia type - E78.5    ---     [name] has T2DM with recent diagnosis of malignant pancreatic head mass.  She was treated at [facility] from 3/2-3/9 for hyperglycemia and managed by the  inpatient Endocrine team. Her hemoglobin a1c was 7.7% at admission. Ultimately  she was discharged home on TPN, being cycled x20 hrs/day with 60 units of  Humulin R added to her TPN bag. She remains mostly NPO with only sips of ice  and water at this time.    Family feels bg readings are improved with the modest increase in Humulin R to  her TPN bag. I have reviewed available bg readings. I will continue 60 units  of Humulin R.    --continue bg checks q6hrs    --f/u TBD. Per family pt will tentatively be having surgery next week and  will wait on scheduling a follow up until after this.  Provided contact to pt's  daughter in law to reach out to me for concerns and questions in the interim.    ---      **Treatment**    ---      **1\. Type 2 diabetes mellitus with hyperglycemia**    Continue Humulin R Solution, 100 UNIT/ML, 60 UNITS, Injection, in TPN bag    ---      **Procedure Codes**    ---      [phone] PHONE E/M BY PHYS 11-20 MIN, Modifiers: GT , SA    ---      **Follow Up**    ---    TBD post operatively    **Appointment Provider:** Dawna Part, NP    Electronically signed by Dawna Part , NP on 08/27/2019 at 03:11 PM EST    Sign off status: Completed        * * *         Endocrinology    [address]    [place], [address]    [place], [place] [address]    Tel: [phone]    Fax: [phone]              * * *          Progress Note: Dawna Part, NP 08/27/2019    ---    Note generated by eClinicalWorks EMR/PM Software ([email or web address])

## 2019-08-27 NOTE — Progress Notes (Signed)
 * * *    [name] [name] **DOB:** [birth year] [phone] yo F) **Acc No.** [id number] **DOS:**  08/27/2019    ---       [name] [name], [name] [name]**    ------    25 Y old Female, DOB: [birth year], External MRN: [id number]    Account Number: [id number]    [address] [place]    Home: [phone]    Insurance: Ollen Gross    PCP: [name] Raring, MD Referring: [name] Raring, MD    Appointment Facility: Surgical Oncology        * * *    08/27/2019 Progress Notes: Andy Gauss, MD **CHN#:** [phone]    ------    ---       **Reason for Appointment**    ---      1\. CALL [phone] PRE-WHIPPLECONSULT    ---      **History of Present Illness**    ---     _GENERAL_ :    I am seeing [name] once again after recent hospitalization finding of a  large ulcer in her duodenum consistent with pancreatic head mass. I am doing a  telemetry visit due to COVID-19. I am here in my office and she is at home. I  do have her permission to do this over the phone. She is scheduled for surgery  in the near future and we are going over the procedure once again.      **Current Medications**    ---    Unknown    * Calcium Carb-Cholecalciferol 500-200 MG-UNIT Tablet 1 capsule with a meal Orally twice daily    ---    * Cyanocobalamin 1000 MCG Tablet as directed Orally Once a day    ---    * Doxazosin Mesylate 1 MG Tablet 1 tablet Orally Once a day    ---    * Famotidine 20 MG Tablet 1 tablet Orally twice daily    ---    * Folic Acid 400 MCG Tablet 1 tablet Orally daily    ---    * Humulin R 100 UNIT/ML Solution 60 UNITS Injection in TPN bag    ---    * Magnesium Oxide 250 MG Tablet as directed Orally once daily    ---    * Metoprolol Tartrate 50 MG Tablet as directed Orally Twice a day    ---    * Ondansetron HCl 4 MG Tablet 1 tablet as needed Orally every 8 hrs    ---    * Oxycodone HCl 5 MG Tablet as directed Orally every 6 hrs    ---    * Prochlorperazine Maleate 10 MG Tablet 1 tablet as needed Orally every 6 hrs as needed    ---       **Past Medical History**    ---      T2DM.        ---    HTN.        ---    HLD.        ---    Multifocal atrial tachycardia.        ---    Chronic pancreatitis and duodenal stricture.        ---    Malignant pancreatic head lesion.        ---      **Family History**    ---      Non-Contributory    ---      **Social History**    ---  lives with son and daughter in law-providing care.    ---      **Allergies**    ---      N.K.D.A.    ---      **Review of Systems**    ---     _Surgery_ :    Constitutional: no fever, chills, or general weakness, no recent weight  change. H&N: no ear pain, no hoarseness, no headache. Skin: no skin lesions,  no rash. Lymphatics: no swollen glands, no painful glands. Heart: no chest  pain, no fluttering of heart. Lung: no shortness of breath, no new or frequent  cough. Gastro: no nausea/vomiting, no abdominal pain or cramps, no  constipation or diarrhea, no blood in stool, no heartburn, no regurgitation.  Urine: no blood in urine, no pain or burning while urinating. Neuro: no dizzy  spells, fainting, no visual loss, no speech disturbance, no motor or sensory  events, no seizures. Musc: no bone or joint pain.         **Assessments**    ---    1\. 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 She does understand at this time. I spent approximately 45 minutes on  the phone. We have scheduled in the near future. In the meantime if she has  any problems or questions she will call us.    ---      **Procedures**    ---    .    Specimen: [id number] Patient: [name], [name]    Procedure: 08/04/2019 Medical Record #: [id number]    Account [id number]    Accessioned: 08/04/2019 DOB/Age/Sex: [birth year] (Age: 72) F    Reported: 08/10/2019 Location/Client: [facility] **[facility]** /    [facility]     Submitted Phys: Esau Grew, M.D.    .    Surgical Pathology Report    Final Diagnosis    A. 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    Note: Cores demonstrate cytologically bland but irregularly shaped and    variably sized glands in a desmoplastic-like stroma with patchy chronic    inflammation. Partial glands, definitive perineural or lymphovascular    invasion are not present. Immunohistochemical stains performed with    adequate control on block A1 demonstrate the following: CD10 shows a LOSS    of apical staining in the irregularly shaped glands while P53 is wild-type    pattern. An additional immunohistochemical stain performed at the    [facility] immunohistochemical laboratory with adequate    controls for SMAD-4 shows LOSS of staining in the irregularly shaped    glands. 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    .    This entire case is reviewed with a second pathologist, Dr. Clement Sayres, MD PhD    who concurs with the above interpretation.    .    Case discussed by phone with Dr. Altha Harm, MD    .    ***Report Electronically Signed By Debbora Presto, M.D.***    ja/08/10/2019 JEFFREY ARNOLD, M.D.    .    All immunohistochemical, in situ hybridization and/or special stain studies    used in the  evaluation of this case show appropriate reactivity.    .    .    .    This certifies that the pathologist named above has personally conducted a    microscopic examination (or gross examination only, where stated) of the    described specimen(s) and has rendered or confirmed the final    diagnosis(es).    .    .    .    Clinical History    R/O malignancy.    .    Pre-Operative Diagnosis    (Not Provided)    .    Post-Operative Diagnosis    (Not Provided)    .    Gross Description    A. PANCREAS HEAD MASS FNB (FORMALIN): The specimen, labeled with the     patient's name, Scorsone, Fidela, and medical record number, consists of 5    cylindrical fragments of soft tan-white and red tissue, measuring 0.1 cm to    0.3 cm in diameter and ranging from 0.3 cm to 2.1 cm in length. The    specimen is submitted in toto in cassette A1.    Marland Kitchen    gp/08/04/2019 GABRIELLE PREMKUMAR    .    Specimen #: [id number] Patient: BABARA, BUFFALO    Procedure: 08/04/2019 Medical Record #:[id number]    Account #: [id number]    Accessioned: 08/04/2019 DOB/Age/Sex: [birth year] (Age: 74) F    Reported: 08/06/2019 Location/Client: [facility]    **[facility]** / [facility]    Submitting Phys: Esau Grew, M.D.    Additional Phys: Andy Gauss, MD    .    Cytopathology Report    Final Cytologic Interpretation    A. LYMPH NODE, PERIPANCREATIC, EUS FNA:    .    Adequacy: SATISFACTORY FOR CYTOLOGIC EVALUATION.    Marland Kitchen    Interpretation: NEGATIVE FOR MALIGNANT CELLS.    Lymphoid aggregates and benign glandular cells in sheets and clusters    .    .    ***Report Electronically Signed By Debbora Presto, M.D.***    ja/08/06/2019    JEFFREY ARNOLD, M.D.    .    Clinical History    R/o malignancy    Patient with pancreas head mass    .    Marland Kitchen    Gross Description    .    A. LYMPH NODE, PERIPANCREATIC FNA;: Received, labeled with the patient's    name "Ciriello, Shiryl" and medical record number is, 15cc clear red Cytolyt    fluid. Fluid is concentrated and one fixed Pap stained ThinPrep slide and    a cell block are prepared. Note: Cell block material has been exposed to    alcohol fixation.    Marland Kitchen    Shannon Sauer    .      **Procedure Codes**    ---      [phone] PHONE E/M BY PHYS 21-30 MIN    ---      **Follow Up**    ---    prn    Electronically signed by Andy Gauss , MD on 10/06/2019 at 09:10 AM EDT    Sign off status: Completed        * * *        Surgical Oncology    [address]    [place], [address]    [place], [place] [address]    Tel: [phone]    Fax: [phone]              * * *  Progress Note: Andy Gauss, MD 08/27/2019    ---    Note generated by eClinicalWorks EMR/PM Software ([email or web address])

## 2019-08-27 NOTE — Progress Notes (Signed)
 .  Progress Notes  .  Patient: [name]  Provider: Foster Simpson  DOB: [birth year] Age: 74 Y Sex: Female  .  PCP: Sherrill Raring  MD  Date: 08/27/2019  .  --------------------------------------------------------------------------------  .  REASON FOR APPOINTMENT  .  1. CALL [phone] PRE-WHIPPLECONSULT  .  HISTORY OF PRESENT ILLNESS  .  GENERAL:   I am seeing Ms. Krapf once again after recent hospitalization  finding of a large ulcer in her duodenum consistent with  pancreatic head mass. I am doing a telemetry visit due to  COVID-19. I am here in my office and she is at home. I do have  her permission to do this over the phone. She is scheduled for  surgery in the near future and we are going over the procedure  once again.  Marland Kitchen  CURRENT MEDICATIONS  .  Unknown Calcium Carb-Cholecalciferol 500-200 MG-UNIT Tablet 1  capsule with a meal Orally twice daily  Unknown Cyanocobalamin 1000 MCG Tablet as directed Orally Once a  day  Unknown Doxazosin Mesylate 1 MG Tablet 1 tablet Orally Once a day  Unknown Famotidine 20 MG Tablet 1 tablet Orally twice daily  Unknown Folic Acid 400 MCG Tablet 1 tablet Orally daily  Unknown Humulin R 100 UNIT/ML Solution 60 UNITS Injection in TPN  bag  Unknown Magnesium Oxide 250 MG Tablet as directed Orally once  daily  Unknown Metoprolol Tartrate 50 MG Tablet as directed Orally Twice  a day  Unknown Ondansetron HCl 4 MG Tablet 1 tablet as needed Orally  every 8 hrs  Unknown Oxycodone HCl 5 MG Tablet as directed Orally every 6 hrs  Unknown Prochlorperazine Maleate 10 MG Tablet 1 tablet as needed  Orally every 6 hrs as needed  .  PAST MEDICAL HISTORY  .  T2DM  HTN  HLD  Multifocal atrial tachycardia  Chronic pancreatitis and duodenal stricture  Malignant pancreatic head lesion  .  ALLERGIES  .  N.K.D.A.  Marland Kitchen  FAMILY HISTORY  .  Non-Contributory  .  SOCIAL HISTORY  .  lives with son and daughter in law-providing care.  Marland Kitchen  REVIEW OF SYSTEMS  .  Surgery:  .  Constitutional:    no  fever, chills, or general weakness, no  recent weight change . H&N:    no ear pain, no hoarseness, no  headache . Skin:    no skin lesions, no rash . Lymphatics:    no  swollen glands, no painful glands . Heart:    no chest pain, no  fluttering of heart . Lung:    no shortness of breath, no new or  frequent cough . Gastro:    no nausea/vomiting, no abdominal pain  or cramps, no constipation or diarrhea, no blood in stool, no  heartburn, no regurgitation . Urine:    no blood in urine, no  pain or burning while urinating . Neuro:    no dizzy spells,  fainting, no visual loss, no speech disturbance, no motor or  sensory events, no seizures . Musc:    no bone or joint pain .  Marland Kitchen  ASSESSMENTS  .  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  Notes again discussed with the patient as well as her family over  the phone extensively about the surgical procedure including the  risks, benefits, options. She does understand at this time. I  spent approximately 45 minutes on the phone. We have scheduled in  the near future. In the meantime if she has any problems or  questions she will call us.  Marland Kitchen  PROCEDURES  .  Marland KitchenSpecimen: [id number] Patient: CAROLAN, AVEDISIAN: 08/04/2019 Medical Record #: [id number] Account  [id number] Accessioned: 08/04/2019 DOB/Age/Sex: 23-Jan-1946 (Age:  59) FReported: 08/10/2019 [facility]: Esau Grew, M.D.  .Surgical Pathology ReportFinal DiagnosisA. PANCREAS, HEAD, MASS,  FNB: - Highly suspicious for invasive well differentiated  adenocarcinoma;see note. Note: Cores demonstrate cytologically  bland but irregularly shaped andvariably sized glands in a  desmoplastic-like stroma with patchy chronicinflammation. Partial  glands, definitive perineural or lymphovascularinvasion are not  present. Immunohistochemical stains performed withadequate  control on block A1 demonstrate the following: CD10 shows a  LOSSof apical staining in  the irregularly shaped glands while P53  is wild-typepattern. An additional immunohistochemical stain  performed at [facility]  immunohistochemical laboratory with adequatecontrols for SMAD-4  shows LOSS of staining in the irregularly shapedglands. Taken  together these findings are highly suspicious for invasivewell  differentiated adenocarcinoma..This entire case is reviewed with  a second pathologist, Dr. Clement Sayres, MD PhDwho concurs with the  above interpretation..Case discussed by phone with Dr. Altha Harm, MD . ***Report Electronically Signed By JEFFREY Debroah Loop,  M.D.***ja/08/10/2019 JEFFREY ARNOLD, M.D. .All  immunohistochemical, in situ hybridization and/or special stain  studiesused in the evaluation of this case show appropriate  reactivity.Marland KitchenMarland KitchenMarland KitchenThis certifies that the pathologist named above has  personally conducted amicroscopic examination (or gross  examination only, where stated) of thedescribed specimen(s) and  has rendered or confirmed the finaldiagnosis(es). ..Marland KitchenClinical  HistoryR/O malignancy..Pre-Operative Diagnosis(Not  Provided).Post-Operative Diagnosis(Not Provided).Gross  DescriptionA. PANCREAS HEAD MASS FNB (FORMALIN): The specimen,  labeled with thepatient's name, [place], [place], and medical record  number, consists of 5cylindrical fragments of soft tan-white and  red tissue, measuring 0.1 cm to0.3 cm in diameter and ranging  from 0.3 cm to 2.1 cm in length. Thespecimen is submitted in toto  in cassette A1. Marland Kitchengp/08/04/2019 GABRIELLE PREMKUMAR .Specimen #:  [id number] Patient: HARTLEIGH, EDMONSTON: 08/04/2019 Medical  Record #:[id number] Account #: [id number] Accessioned: 08/04/2019  DOB/Age/Sex: May 12, 1946 (Age: 67) FReported: 08/06/2019  [facility]: Esau [facility] M[facility]. Additional Phys:  Andy Gauss, MD.Cytopathology ReportFinal Cytologic  InterpretationA. LYMPH NODE, PERIPANCREATIC, EUS FNA:.Adequacy:  SATISFACTORY  FOR CYTOLOGIC EVALUATION. Marland KitchenInterpretation: NEGATIVE  FOR MALIGNANT CELLS.Lymphoid aggregates and benign glandular  cells in sheets and clusters .. ***Report Electronically Signed  By Debbora Presto, M.D.***ja/08/06/2019 JEFFREY ARNOLD, M.D.  .Clinical HistoryR/o malignancyPatient with pancreas head  mass.Michaell Cowing Description.A. LYMPH NODE, PERIPANCREATIC FNA;:  Received, labeled with the patient'sname "Blacklock, Kalise" and  medical record number is, 15cc clear red Cytolytfluid. Fluid is  concentrated and one fixed Pap stained ThinPrep slide anda cell  block are prepared. Note: Cell block material has been exposed  toalcohol fixation... CHANELLE BRAY .  Marland Kitchen  PROCEDURE CODES  .  [id number] PHONE E/M BY PHYS 21-30 MIN  .  FOLLOW UP  .  prn  .  Electronically signed by Andy Gauss , MD on  10/06/2019 at 09:10 AM EDT  .  Document electronically signed by Foster Simpson   .

## 2019-08-28 ENCOUNTER — Inpatient Hospital Stay
Admit: 2019-08-28 | Disposition: A | Discharge status: Skilled Nursing Facility | Source: Other Acute Inpatient Hospital | Attending: Surgery | Admitting: Surgery

## 2019-08-28 ENCOUNTER — Observation Stay: Admission: AD | Admit: 2019-08-28 | Payer: 59

## 2019-08-28 LAB — HX CHEM-PANELS
HX ANION GAP: 26 — ABNORMAL HIGH (ref 3–14)
HX ANION GAP: 25 — ABNORMAL HIGH (ref 3–14)
HX ANION GAP: 18 — ABNORMAL HIGH (ref 3–14)
HX ANION GAP: 19 — ABNORMAL HIGH (ref 3–14)
HX ANION GAP: 18 — ABNORMAL HIGH (ref 3–14)
HX ANION GAP: 18 — ABNORMAL HIGH (ref 3–14)
HX ANION GAP: 6 (ref 3–14)
HX BLOOD UREA NITROGEN: 117 mg/dL — ABNORMAL HIGH (ref 6–24)
HX BLOOD UREA NITROGEN: 116 mg/dL — ABNORMAL HIGH (ref 6–24)
HX BLOOD UREA NITROGEN: 125 mg/dL — ABNORMAL HIGH (ref 6–24)
HX BLOOD UREA NITROGEN: 118 mg/dL — ABNORMAL HIGH (ref 6–24)
HX BLOOD UREA NITROGEN: 107 mg/dL — ABNORMAL HIGH (ref 6–24)
HX BLOOD UREA NITROGEN: 102 mg/dL — ABNORMAL HIGH (ref 6–24)
HX BLOOD UREA NITROGEN: 90 mg/dL — ABNORMAL HIGH (ref 6–24)
HX CHLORIDE (CL): 75 meq/L — ABNORMAL LOW (ref 98–110)
HX CHLORIDE (CL): 74 meq/L — ABNORMAL LOW (ref 98–110)
HX CHLORIDE (CL): 82 meq/L — ABNORMAL LOW (ref 98–110)
HX CHLORIDE (CL): 85 meq/L — ABNORMAL LOW (ref 98–110)
HX CHLORIDE (CL): 88 meq/L — ABNORMAL LOW (ref 98–110)
HX CHLORIDE (CL): 89 meq/L — ABNORMAL LOW (ref 98–110)
HX CHLORIDE (CL): 94 meq/L — ABNORMAL LOW (ref 98–110)
HX CO2: 46 meq/L — AB (ref 20–30)
HX CO2: 48 meq/L — AB (ref 20–30)
HX CO2: 50 meq/L — AB (ref 20–30)
HX CO2: 47 meq/L — AB (ref 20–30)
HX CO2: 45 meq/L — AB (ref 20–30)
HX CO2: 44 meq/L — AB (ref 20–30)
HX CO2: 48 meq/L — AB (ref 20–30)
HX CREATININE (CR): 3.95 mg/dL — ABNORMAL HIGH (ref 0.57–1.30)
HX CREATININE (CR): 4.03 mg/dL — ABNORMAL HIGH (ref 0.57–1.30)
HX CREATININE (CR): 3.8 mg/dL — ABNORMAL HIGH (ref 0.57–1.30)
HX CREATININE (CR): 3.58 mg/dL — ABNORMAL HIGH (ref 0.57–1.30)
HX CREATININE (CR): 3.34 mg/dL — ABNORMAL HIGH (ref 0.57–1.30)
HX CREATININE (CR): 3.32 mg/dL — ABNORMAL HIGH (ref 0.57–1.30)
HX CREATININE (CR): 3.01 mg/dL — ABNORMAL HIGH (ref 0.57–1.30)
HX GFR, AFRICAN AMERICAN: 12 mL/min/{1.73_m2} — ABNORMAL LOW
HX GFR, AFRICAN AMERICAN: 12 mL/min/{1.73_m2} — ABNORMAL LOW
HX GFR, AFRICAN AMERICAN: 13 mL/min/{1.73_m2} — ABNORMAL LOW
HX GFR, AFRICAN AMERICAN: 14 mL/min/{1.73_m2} — ABNORMAL LOW
HX GFR, AFRICAN AMERICAN: 15 mL/min/{1.73_m2} — ABNORMAL LOW
HX GFR, AFRICAN AMERICAN: 15 mL/min/{1.73_m2} — ABNORMAL LOW
HX GFR, AFRICAN AMERICAN: 17 mL/min/{1.73_m2} — ABNORMAL LOW
HX GFR, NON-AFRICAN AMERICAN: 10 mL/min/{1.73_m2} — ABNORMAL LOW
HX GFR, NON-AFRICAN AMERICAN: 10 mL/min/{1.73_m2} — ABNORMAL LOW
HX GFR, NON-AFRICAN AMERICAN: 11 mL/min/{1.73_m2} — ABNORMAL LOW
HX GFR, NON-AFRICAN AMERICAN: 12 mL/min/{1.73_m2} — ABNORMAL LOW
HX GFR, NON-AFRICAN AMERICAN: 13 mL/min/{1.73_m2} — ABNORMAL LOW
HX GFR, NON-AFRICAN AMERICAN: 13 mL/min/{1.73_m2} — ABNORMAL LOW
HX GFR, NON-AFRICAN AMERICAN: 15 mL/min/{1.73_m2} — ABNORMAL LOW
HX GLUCOSE: 270 mg/dL — ABNORMAL HIGH (ref 70–139)
HX GLUCOSE: 273 mg/dL — ABNORMAL HIGH (ref 70–139)
HX GLUCOSE: 150 mg/dL — ABNORMAL HIGH (ref 70–139)
HX GLUCOSE: 144 mg/dL — ABNORMAL HIGH (ref 70–139)
HX GLUCOSE: 164 mg/dL — ABNORMAL HIGH (ref 70–139)
HX GLUCOSE: 136 mg/dL (ref 70–139)
HX GLUCOSE: 234 mg/dL — ABNORMAL HIGH (ref 70–139)
HX LACTATE: 1.9 meq/L (ref 0.5–2.2)
HX POTASSIUM (K): 3.3 meq/L — ABNORMAL LOW (ref 3.6–5.1)
HX POTASSIUM (K): 3.3 meq/L — ABNORMAL LOW (ref 3.6–5.1)
HX POTASSIUM (K): 3 meq/L — ABNORMAL LOW (ref 3.6–5.1)
HX POTASSIUM (K): 2.9 meq/L — ABNORMAL LOW (ref 3.6–5.1)
HX POTASSIUM (K): 3.1 meq/L — ABNORMAL LOW (ref 3.6–5.1)
HX POTASSIUM (K): 3.1 meq/L — ABNORMAL LOW (ref 3.6–5.1)
HX POTASSIUM (K): 4 meq/L (ref 3.6–5.1)
HX SODIUM (NA): 147 meq/L — ABNORMAL HIGH (ref 135–145)
HX SODIUM (NA): 147 meq/L — ABNORMAL HIGH (ref 135–145)
HX SODIUM (NA): 150 meq/L — ABNORMAL HIGH (ref 135–145)
HX SODIUM (NA): 151 meq/L — ABNORMAL HIGH (ref 135–145)
HX SODIUM (NA): 151 meq/L — ABNORMAL HIGH (ref 135–145)
HX SODIUM (NA): 151 meq/L — ABNORMAL HIGH (ref 135–145)
HX SODIUM (NA): 148 meq/L — ABNORMAL HIGH (ref 135–145)

## 2019-08-28 LAB — HX HEM-ROUTINE
HX BASO #: 0 10*3/uL (ref 0.0–0.2)
HX BASO #: 0 10*3/uL (ref 0.0–0.2)
HX BASO #: 0 10*3/uL (ref 0.0–0.2)
HX BASO: 0 %
HX BASO: 0 %
HX BASO: 0 %
HX EOSIN #: 0 10*3/uL (ref 0.0–0.5)
HX EOSIN #: 0 10*3/uL (ref 0.0–0.5)
HX EOSIN #: 0.1 10*3/uL (ref 0.0–0.5)
HX EOSIN: 0 %
HX EOSIN: 0 %
HX EOSIN: 1 %
HX HCT: 30.8 % — ABNORMAL LOW (ref 32.0–45.0)
HX HCT: 27.9 % — ABNORMAL LOW (ref 32.0–45.0)
HX HCT: 27.4 % — ABNORMAL LOW (ref 32.0–45.0)
HX HGB: 9.8 g/dL — ABNORMAL LOW (ref 11.0–15.0)
HX HGB: 8.6 g/dL — ABNORMAL LOW (ref 11.0–15.0)
HX HGB: 8.4 g/dL — ABNORMAL LOW (ref 11.0–15.0)
HX IMMATURE GRANULOCYTE#: 0.1 10*3/uL (ref 0.0–0.1)
HX IMMATURE GRANULOCYTE#: 0.1 10*3/uL (ref 0.0–0.1)
HX IMMATURE GRANULOCYTE#: 0 10*3/uL (ref 0.0–0.1)
HX IMMATURE GRANULOCYTE: 0 %
HX IMMATURE GRANULOCYTE: 1 %
HX IMMATURE GRANULOCYTE: 0 %
HX LYMPH #: 1.1 10*3/uL (ref 1.0–4.0)
HX LYMPH #: 0.8 10*3/uL — ABNORMAL LOW (ref 1.0–4.0)
HX LYMPH #: 0.9 10*3/uL — ABNORMAL LOW (ref 1.0–4.0)
HX LYMPH: 7 %
HX LYMPH: 6 %
HX LYMPH: 8 %
HX MCH: 29.5 pg (ref 26.0–34.0)
HX MCH: 28.9 pg (ref 26.0–34.0)
HX MCH: 29.2 pg (ref 26.0–34.0)
HX MCHC: 31.8 g/dL — ABNORMAL LOW (ref 32.0–36.0)
HX MCHC: 30.8 g/dL — ABNORMAL LOW (ref 32.0–36.0)
HX MCHC: 30.7 g/dL — ABNORMAL LOW (ref 32.0–36.0)
HX MCV: 92.8 fL (ref 80.0–98.0)
HX MCV: 93.6 fL (ref 80.0–98.0)
HX MCV: 95.1 fL (ref 80.0–98.0)
HX MONO #: 0.9 10*3/uL — ABNORMAL HIGH (ref 0.2–0.8)
HX MONO #: 0.8 10*3/uL (ref 0.2–0.8)
HX MONO #: 0.5 10*3/uL (ref 0.2–0.8)
HX MONO: 6 %
HX MONO: 6 %
HX MONO: 5 %
HX MPV: 12 fL — ABNORMAL HIGH (ref 9.1–11.7)
HX MPV: 11.7 fL (ref 9.1–11.7)
HX MPV: 11.4 fL (ref 9.1–11.7)
HX NEUT #: 13.9 10*3/uL — ABNORMAL HIGH (ref 1.5–7.5)
HX NEUT #: 11.6 10*3/uL — ABNORMAL HIGH (ref 1.5–7.5)
HX NEUT #: 9.6 10*3/uL — ABNORMAL HIGH (ref 1.5–7.5)
HX NRBC #: 0 10*3/uL
HX NRBC #: 0 10*3/uL
HX NRBC #: 0 10*3/uL
HX NUCLEATED RBC: 0 %
HX NUCLEATED RBC: 0 %
HX NUCLEATED RBC: 0 %
HX PLT: 288 10*3/uL (ref 150–400)
HX PLT: 277 10*3/uL (ref 150–400)
HX PLT: 262 10*3/uL (ref 150–400)
HX RBC BLOOD COUNT: 3.32 M/uL — ABNORMAL LOW (ref 3.70–5.00)
HX RBC BLOOD COUNT: 2.98 M/uL — ABNORMAL LOW (ref 3.70–5.00)
HX RBC BLOOD COUNT: 2.88 M/uL — ABNORMAL LOW (ref 3.70–5.00)
HX RDW: 13.6 % (ref 11.5–14.5)
HX RDW: 13.8 % (ref 11.5–14.5)
HX RDW: 13.8 % (ref 11.5–14.5)
HX SEG NEUT: 87 %
HX SEG NEUT: 87 %
HX SEG NEUT: 86 %
HX WBC: 16 10*3/uL — ABNORMAL HIGH (ref 4.0–11.0)
HX WBC: 13.3 10*3/uL — ABNORMAL HIGH (ref 4.0–11.0)
HX WBC: 11.1 10*3/uL — ABNORMAL HIGH (ref 4.0–11.0)

## 2019-08-28 LAB — HX CHEM-BLOODGAS
HX BASE EXCESS: 24.1 mmol/L
HX BASE EXCESS: 27.7 mmol/L
HX BICARBONATE: 50 meq/L — AB (ref 21–28)
HX BICARBONATE: 54 meq/L — AB (ref 21–28)
HX CALCULATED CO2: 46 meq/L — ABNORMAL HIGH (ref 17–32)
HX CALCULATED CO2: 48 meq/L — ABNORMAL HIGH (ref 17–32)
HX CARBOXYHEMOGLOBIN: 1.3 % (ref 0.0–3.0)
HX CARBOXYHEMOGLOBIN: 1.3 % (ref 0.0–3.0)
HX METHEMOGLOBIN: 0.9 % (ref 0.0–1.8)
HX METHEMOGLOBIN: 1.3 % (ref 0.0–1.8)
HX OXYGEN SATURATION, MEASURED (SVO2): 96 % (ref 95.0–98.0)
HX OXYGEN SATURATION, MEASURED (SVO2): 99.7 % — ABNORMAL HIGH (ref 95.0–98.0)
HX PCO2: 61 mmHg — ABNORMAL HIGH (ref 35–45)
HX PCO2: 66 mmHg — ABNORMAL HIGH (ref 35–45)
HX PH: 7.49 — ABNORMAL HIGH (ref 7.35–7.45)
HX PH: 7.55 — ABNORMAL HIGH (ref 7.35–7.45)
HX PO2: 176 mmHg — ABNORMAL HIGH (ref 85–105)
HX PO2: 82 mmHg — ABNORMAL LOW (ref 85–105)

## 2019-08-28 LAB — HX BF-URINALYSIS
HX HYALINE CAST: 2.1 /LPF
HX KETONES: NEGATIVE mg/dL
HX NITRITE LEVEL: NEGATIVE
HX RED BLOOD CELLS: 10 /HPF — AB
HX SPECIFIC GRAVITY: 1.013
HX SQUAMOUS EPITHELIAL CELLS: NEGATIVE /HPF
HX U BILIRUBIN: NEGATIVE
HX U GLUCOSE: NEGATIVE mg/dL
HX U PH: >=9
HX U UROBILINIG: 0.2 EU
HX WHITE BLOOD CELLS: >100 /HPF — AB

## 2019-08-28 LAB — HX BF-CHEM/URINE
HX CREATININE, RANDOM URINE: 39.59 mg/dL
HX OSMOLALITY, URINE: 435 mosm/kg (ref 50–1300)
HX SODIUM, RANDOM URINE: 84 meq/L
HX URINE CALCIUM RANDOM: <2 mg/dL
HX URINE CHLORIDE RANDOM: <20 meq/L
HX URINE PHOSPHORUS RANDOM: 55 mg/dL
HX URINE POTASSIUM RANDOM: 77 meq/L

## 2019-08-28 LAB — HX CHEM-OTHER
HX ALBUMIN: 2.6 g/dL — ABNORMAL LOW (ref 3.4–4.8)
HX CALCIUM (CA): 8.4 mg/dL — ABNORMAL LOW (ref 8.5–10.5)
HX CALCIUM (CA): 8.5 mg/dL (ref 8.5–10.5)
HX CALCIUM (CA): 7.8 mg/dL — ABNORMAL LOW (ref 8.5–10.5)
HX CALCIUM (CA): 7.7 mg/dL — ABNORMAL LOW (ref 8.5–10.5)
HX CALCIUM (CA): 7.7 mg/dL — ABNORMAL LOW (ref 8.5–10.5)
HX CALCIUM (CA): 8.1 mg/dL — ABNORMAL LOW (ref 8.5–10.5)
HX CALCIUM (CA): 7.5 mg/dL — ABNORMAL LOW (ref 8.5–10.5)
HX MAGNESIUM: 2.8 mg/dL — ABNORMAL HIGH (ref 1.6–2.6)
HX MAGNESIUM: 2.9 mg/dL — ABNORMAL HIGH (ref 1.6–2.6)
HX MAGNESIUM: 2.7 mg/dL — ABNORMAL HIGH (ref 1.6–2.6)
HX MAGNESIUM: 2.6 mg/dL (ref 1.6–2.6)
HX MAGNESIUM: 2.4 mg/dL (ref 1.6–2.6)
HX MAGNESIUM: 2.4 mg/dL (ref 1.6–2.6)
HX MAGNESIUM: 2.3 mg/dL (ref 1.6–2.6)
HX PHOSPHORUS: 9.5 mg/dL — ABNORMAL HIGH (ref 2.7–4.5)
HX PHOSPHORUS: 9.8 mg/dL — ABNORMAL HIGH (ref 2.7–4.5)
HX PHOSPHORUS: 8.5 mg/dL — ABNORMAL HIGH (ref 2.7–4.5)
HX PHOSPHORUS: 7.2 mg/dL — ABNORMAL HIGH (ref 2.7–4.5)
HX PHOSPHORUS: 6.3 mg/dL — ABNORMAL HIGH (ref 2.7–4.5)
HX PHOSPHORUS: 5.6 mg/dL — ABNORMAL HIGH (ref 2.7–4.5)
HX PHOSPHORUS: 3.8 mg/dL (ref 2.7–4.5)

## 2019-08-28 LAB — HX DIABETES
HX GLUCOSE: 270 mg/dL — ABNORMAL HIGH (ref 70–139)
HX GLUCOSE: 273 mg/dL — ABNORMAL HIGH (ref 70–139)
HX GLUCOSE: 150 mg/dL — ABNORMAL HIGH (ref 70–139)
HX GLUCOSE: 144 mg/dL — ABNORMAL HIGH (ref 70–139)
HX GLUCOSE: 164 mg/dL — ABNORMAL HIGH (ref 70–139)
HX GLUCOSE: 136 mg/dL (ref 70–139)
HX GLUCOSE: 234 mg/dL — ABNORMAL HIGH (ref 70–139)

## 2019-08-28 LAB — HX POINT OF CARE
HX GLUCOSE-POCT: 280 mg/dL — ABNORMAL HIGH (ref 70–139)
HX GLUCOSE-POCT: 141 mg/dL — ABNORMAL HIGH (ref 70–139)
HX GLUCOSE-POCT: 174 mg/dL — ABNORMAL HIGH (ref 70–139)
HX GLUCOSE-POCT: 123 mg/dL (ref 70–139)

## 2019-08-28 LAB — HX CHEM-VITAMIN: HX PREALBUMIN: 12 mg/dL — ABNORMAL LOW (ref 14.0–38.0)

## 2019-08-28 LAB — HX COAGULATION
HX INR PT: 1 (ref 0.9–1.3)
HX PROTHROMBIN TIME: 11.5 s (ref 9.7–14.0)
HX PTT: 25.5 s — ABNORMAL LOW (ref 25.7–35.7)

## 2019-08-28 NOTE — Op Note (Signed)
 Patient    [name], [name]             Med Rec #:  [id number]  Name:  Operation  08/30/2019                Pt.  Dt:                                  Location:  .  Marland Kitchen                               OPERATIVE REPORT  .  Marland Kitchen  PREOPERATIVE DIAGNOSIS:  Pancreatic cancer.  Marland Kitchen  POSTOPERATIVE DIAGNOSIS:  Pancreatic cancer.  Marland Kitchen  PROCEDURE:  Gastrojejunostomy in an antecolic fashion, Billroth II.  Marland Kitchen  SURGEON:  Andy Gauss, M.D.  .  ASSISTANT:  Curly Shores, M.D.  .  ANESTHESIA:  General endotracheal.  .  ESTIMATED BLOOD LOSS:  100 mL.  Marland Kitchen  DRAINS:  None.  .  SPECIMENS:  None.  Marland Kitchen  FINDINGS:  The patient with inflammation in the right upper quadrant.  The  entire pancreas was quite hard.  The superior mesenteric vein was being  pulled in to where the tumor was.  Around the porta hepatis was also quite  inflamed.  We did not see any other distant disease.  There was concern  that the SMV was involved in the area and with her being a Jehovah's  Witness and starting with a low hemoglobin ____ it would not be beneficial  surgical procedure for her as well as potentially detrimental.  .  COMPLICATIONS:  None.  .  DESCRIPTION OF PROCEDURE:  The patient was taken to the operating room and  placed on the operating room table.  The patient was then anesthetized and  intubated without difficulties.  Her abdomen was then prepped and draped in  the usual sterile fashion.  Midline incision was made.  Upon entering ____,  exploration of the abdomen revealed no other pathology.  At this point in  time, we took the hepatic flexure down, which we had some great  difficulties in doing due to the fact that there was a large amount of  inflammation.  We then attempted to try and kocherize the duodenum and once  again this area was quite hard and inflamed.  After slowly taking this  down, we identified the inferior vena cava and we were able to come  underneath the head of the pancreas.  We were able to feel the SMA  proximally, but distally, we  had some difficulties in feeling it.  We  turned our attention to the omentum and took it off the transverse colon  and transverse mesocolon to get into the lesser sac.  In doing so, we came  along the inferior border of the pancreas in trying to identify the  superior mesenteric vein.  Near the superior mesenteric vein, there were  some other dilated veins, which we took down using the LigaSure device.  This was somewhat anatomical variant versus portal hypertension.  Once we  did this, we had some difficulties in getting underneath the neck of the  pancreas, but we noted near the SMV area, there was some tumor that was on  the side or a vein that was being quite adherent that we could not dissect  off.  We opted to turn our  attention to the porta hepatis, which was quite  inflamed.  We came along the superior border of the pancreas, taking the  lymph node off the hepatic artery to identify the gastroduodenal artery.  We dissected out the peritoneum overlying the porta hepatis.  The common  bile duct seemed quite large.  There was known stent within it.  We had  some great difficulties in trying to do this, but we were able to get  around the common bile duct and a vessel loop was placed.  We continued  dissection in this area to find the gastroduodenal artery, which we  eventually did.  During this time, we had small areas of bleeding.  The  patient was known to have a hemoglobin starting off at 7.3 at this point in  time.  With her being a Jehovah's Witness and noting that it is possible  that we would not be able to remove the tumor and concern that we could  have some extra blood loss due to the small vessels with a fair amount of  inflammation, we felt it safe for not to do the resection at this point in  time due to tumor issues as well as her blood counts.  We went ahead and  brought a loop of jejunum approximately 25 cm distal to the ligament of  Treitz in an antecolic fashion to the posterior wall of the  stomach.  We  did a gastrojejunostomy using a GIA 3.8 mm stapler.  The defect was closed  using a TA 3.5 mm stapler.  Once this was done, Seprafilm was placed into  the abdomen.  On-Q pain catheters were placed on each side of the abdomen  going through the oblique musculature just staying above the peritoneal  surface.  These were each bolused with 10 mL of 0.25% Marcaine.  These were  secured in place using benzoin, Steri-Strips, and Tegaderms.  The midline  fascia was reapproximated using #1 PDS suture in a running fashion.  The  skin was then reapproximated using skin clips.  The area was cleaned off  using wet-to-dry.  A sterile dressing was then placed in the wound.  The  patient was then awoken and extubated without any difficulties.  The  patient was then taken off the operating table and placed on a stretcher  and returned to recovery room.  At the end of procedure, all needle,  sponge, and instrument counts were correct.  I was present for the entire  case.  .  Electronically Signed  Foster Simpson, MD 10/01/2019 04:07 P  .  Marland Kitchen  Dictated by: Foster Simpson, MD  .  D:    09/08/2019  T:    09/08/2019 01:09 P  Dictation ID:  [id number]/Doc#  [id number]  .  cc:  .  Marland Kitchen      Document is preliminary until electronically or manually signed by                             attending physician.

## 2019-08-29 LAB — HX CHEM-PANELS
HX ANION GAP: 8 (ref 3–14)
HX ANION GAP: 5 (ref 3–14)
HX ANION GAP: 6 (ref 3–14)
HX ANION GAP: 8 (ref 3–14)
HX BLOOD UREA NITROGEN: 86 mg/dL — ABNORMAL HIGH (ref 6–24)
HX BLOOD UREA NITROGEN: 82 mg/dL — ABNORMAL HIGH (ref 6–24)
HX BLOOD UREA NITROGEN: 72 mg/dL — ABNORMAL HIGH (ref 6–24)
HX BLOOD UREA NITROGEN: 66 mg/dL — ABNORMAL HIGH (ref 6–24)
HX CHLORIDE (CL): 97 meq/L — ABNORMAL LOW (ref 98–110)
HX CHLORIDE (CL): 96 meq/L — ABNORMAL LOW (ref 98–110)
HX CHLORIDE (CL): 96 meq/L — ABNORMAL LOW (ref 98–110)
HX CHLORIDE (CL): 96 meq/L — ABNORMAL LOW (ref 98–110)
HX CO2: 46 meq/L — AB (ref 20–30)
HX CO2: 47 meq/L — AB (ref 20–30)
HX CO2: 45 meq/L — AB (ref 20–30)
HX CO2: 44 meq/L — AB (ref 20–30)
HX CREATININE (CR): 2.93 mg/dL — ABNORMAL HIGH (ref 0.57–1.30)
HX CREATININE (CR): 2.79 mg/dL — ABNORMAL HIGH (ref 0.57–1.30)
HX CREATININE (CR): 2.42 mg/dL — ABNORMAL HIGH (ref 0.57–1.30)
HX CREATININE (CR): 2.41 mg/dL — ABNORMAL HIGH (ref 0.57–1.30)
HX GFR, AFRICAN AMERICAN: 17 mL/min/{1.73_m2} — ABNORMAL LOW
HX GFR, AFRICAN AMERICAN: 18 mL/min/{1.73_m2} — ABNORMAL LOW
HX GFR, AFRICAN AMERICAN: 22 mL/min/{1.73_m2} — ABNORMAL LOW
HX GFR, AFRICAN AMERICAN: 22 mL/min/{1.73_m2} — ABNORMAL LOW
HX GFR, NON-AFRICAN AMERICAN: 15 mL/min/{1.73_m2} — ABNORMAL LOW
HX GFR, NON-AFRICAN AMERICAN: 16 mL/min/{1.73_m2} — ABNORMAL LOW
HX GFR, NON-AFRICAN AMERICAN: 19 mL/min/{1.73_m2} — ABNORMAL LOW
HX GFR, NON-AFRICAN AMERICAN: 19 mL/min/{1.73_m2} — ABNORMAL LOW
HX GLUCOSE: 135 mg/dL (ref 70–139)
HX GLUCOSE: 204 mg/dL — ABNORMAL HIGH (ref 70–139)
HX GLUCOSE: 177 mg/dL — ABNORMAL HIGH (ref 70–139)
HX GLUCOSE: 373 mg/dL — ABNORMAL HIGH (ref 70–139)
HX POTASSIUM (K): 3.6 meq/L (ref 3.6–5.1)
HX POTASSIUM (K): 3.7 meq/L (ref 3.6–5.1)
HX POTASSIUM (K): 3.3 meq/L — ABNORMAL LOW (ref 3.6–5.1)
HX POTASSIUM (K): 3.6 meq/L (ref 3.6–5.1)
HX SODIUM (NA): 151 meq/L — ABNORMAL HIGH (ref 135–145)
HX SODIUM (NA): 148 meq/L — ABNORMAL HIGH (ref 135–145)
HX SODIUM (NA): 147 meq/L — ABNORMAL HIGH (ref 135–145)
HX SODIUM (NA): 148 meq/L — ABNORMAL HIGH (ref 135–145)

## 2019-08-29 LAB — HX HEM-ROUTINE
HX BASO #: 0 10*3/uL (ref 0.0–0.2)
HX BASO: 0 %
HX EOSIN #: 0.2 10*3/uL (ref 0.0–0.5)
HX EOSIN: 2 %
HX HCT: 28.1 % — ABNORMAL LOW (ref 32.0–45.0)
HX HGB: 8.5 g/dL — ABNORMAL LOW (ref 11.0–15.0)
HX IMMATURE GRANULOCYTE#: 0 10*3/uL (ref 0.0–0.1)
HX IMMATURE GRANULOCYTE: 0 %
HX LYMPH #: 1.1 10*3/uL (ref 1.0–4.0)
HX LYMPH: 11 %
HX MCH: 28.8 pg (ref 26.0–34.0)
HX MCHC: 30.2 g/dL — ABNORMAL LOW (ref 32.0–36.0)
HX MCV: 95.3 fL (ref 80.0–98.0)
HX MONO #: 0.5 10*3/uL (ref 0.2–0.8)
HX MONO: 5 %
HX MPV: 12 fL — ABNORMAL HIGH (ref 9.1–11.7)
HX NEUT #: 8 10*3/uL — ABNORMAL HIGH (ref 1.5–7.5)
HX NRBC #: 0 10*3/uL
HX NUCLEATED RBC: 0 %
HX PLT: 255 10*3/uL (ref 150–400)
HX RBC BLOOD COUNT: 2.95 M/uL — ABNORMAL LOW (ref 3.70–5.00)
HX RDW: 13.8 % (ref 11.5–14.5)
HX SEG NEUT: 81 %
HX WBC: 9.9 10*3/uL (ref 4.0–11.0)

## 2019-08-29 LAB — HX DIABETES
HX GLUCOSE: 135 mg/dL (ref 70–139)
HX GLUCOSE: 204 mg/dL — ABNORMAL HIGH (ref 70–139)
HX GLUCOSE: 177 mg/dL — ABNORMAL HIGH (ref 70–139)
HX GLUCOSE: 373 mg/dL — ABNORMAL HIGH (ref 70–139)

## 2019-08-29 LAB — HX POINT OF CARE
HX GLUCOSE-POCT: 159 mg/dL — ABNORMAL HIGH (ref 70–139)
HX GLUCOSE-POCT: 185 mg/dL — ABNORMAL HIGH (ref 70–139)
HX GLUCOSE-POCT: 348 mg/dL — ABNORMAL HIGH (ref 70–139)
HX GLUCOSE-POCT: 218 mg/dL — ABNORMAL HIGH (ref 70–139)

## 2019-08-29 LAB — HX CHEM-OTHER
HX CALCIUM (CA): 7.9 mg/dL — ABNORMAL LOW (ref 8.5–10.5)
HX CALCIUM (CA): 7.9 mg/dL — ABNORMAL LOW (ref 8.5–10.5)
HX CALCIUM (CA): 8.2 mg/dL — ABNORMAL LOW (ref 8.5–10.5)
HX CALCIUM (CA): 8.2 mg/dL — ABNORMAL LOW (ref 8.5–10.5)
HX MAGNESIUM: 2.4 mg/dL (ref 1.6–2.6)
HX MAGNESIUM: 2.4 mg/dL (ref 1.6–2.6)
HX MAGNESIUM: 2.4 mg/dL (ref 1.6–2.6)
HX MAGNESIUM: 2.2 mg/dL (ref 1.6–2.6)
HX PHOSPHORUS: 2.5 mg/dL — ABNORMAL LOW (ref 2.7–4.5)
HX PHOSPHORUS: 2.3 mg/dL — ABNORMAL LOW (ref 2.7–4.5)
HX PHOSPHORUS: 1.6 mg/dL — ABNORMAL LOW (ref 2.7–4.5)
HX PHOSPHORUS: 3.9 mg/dL (ref 2.7–4.5)

## 2019-08-29 LAB — HX TRANSFUSION
HX ABO-RH INTERPRETATION (GEL): A POS
HX ANTIBODY SCREEN (GEL): NEGATIVE

## 2019-08-30 ENCOUNTER — Ambulatory Visit

## 2019-08-30 ENCOUNTER — Observation Stay: Admit: 2019-08-30 | Payer: 59

## 2019-08-30 LAB — HX HEM-ROUTINE
HX BASO #: 0 10*3/uL (ref 0.0–0.2)
HX BASO #: 0 10*3/uL (ref 0.0–0.2)
HX BASO: 1 %
HX BASO: 0 %
HX EOSIN #: 0.2 10*3/uL (ref 0.0–0.5)
HX EOSIN #: 0.1 10*3/uL (ref 0.0–0.5)
HX EOSIN: 3 %
HX EOSIN: 1 %
HX HCT: 25.8 % — ABNORMAL LOW (ref 32.0–45.0)
HX HCT: 27.1 % — ABNORMAL LOW (ref 32.0–45.0)
HX HGB: 7.9 g/dL — ABNORMAL LOW (ref 11.0–15.0)
HX HGB: 8.3 g/dL — ABNORMAL LOW (ref 11.0–15.0)
HX IMMATURE GRANULOCYTE#: 0 10*3/uL (ref 0.0–0.1)
HX IMMATURE GRANULOCYTE#: 0.1 10*3/uL (ref 0.0–0.1)
HX IMMATURE GRANULOCYTE: 0 %
HX IMMATURE GRANULOCYTE: 1 %
HX LYMPH #: 1.1 10*3/uL (ref 1.0–4.0)
HX LYMPH #: 1 10*3/uL (ref 1.0–4.0)
HX LYMPH: 12 %
HX LYMPH: 10 %
HX MCH: 29.3 pg (ref 26.0–34.0)
HX MCH: 29.4 pg (ref 26.0–34.0)
HX MCHC: 30.6 g/dL — ABNORMAL LOW (ref 32.0–36.0)
HX MCHC: 30.6 g/dL — ABNORMAL LOW (ref 32.0–36.0)
HX MCV: 95.6 fL (ref 80.0–98.0)
HX MCV: 96.1 fL (ref 80.0–98.0)
HX MONO #: 0.5 10*3/uL (ref 0.2–0.8)
HX MONO #: 0.4 10*3/uL (ref 0.2–0.8)
HX MONO: 6 %
HX MONO: 5 %
HX MPV: 11.5 fL (ref 9.1–11.7)
HX MPV: 11.9 fL — ABNORMAL HIGH (ref 9.1–11.7)
HX NEUT #: 6.9 10*3/uL (ref 1.5–7.5)
HX NEUT #: 8 10*3/uL — ABNORMAL HIGH (ref 1.5–7.5)
HX NRBC #: 0 10*3/uL
HX NRBC #: 0 10*3/uL
HX NUCLEATED RBC: 0 %
HX NUCLEATED RBC: 0 %
HX PLT: 246 10*3/uL (ref 150–400)
HX PLT: 254 10*3/uL (ref 150–400)
HX RBC BLOOD COUNT: 2.7 M/uL — ABNORMAL LOW (ref 3.70–5.00)
HX RBC BLOOD COUNT: 2.82 M/uL — ABNORMAL LOW (ref 3.70–5.00)
HX RDW: 13.6 % (ref 11.5–14.5)
HX RDW: 13.7 % (ref 11.5–14.5)
HX SEG NEUT: 78 %
HX SEG NEUT: 83 %
HX WBC: 8.8 10*3/uL (ref 4.0–11.0)
HX WBC: 9.6 10*3/uL (ref 4.0–11.0)

## 2019-08-30 LAB — HX CHEM-PANELS
HX ANION GAP: 5 (ref 3–14)
HX ANION GAP: 6 (ref 3–14)
HX BLOOD UREA NITROGEN: 63 mg/dL — ABNORMAL HIGH (ref 6–24)
HX BLOOD UREA NITROGEN: 57 mg/dL — ABNORMAL HIGH (ref 6–24)
HX CHLORIDE (CL): 101 meq/L (ref 98–110)
HX CHLORIDE (CL): 104 meq/L (ref 98–110)
HX CO2: 42 meq/L — AB (ref 20–30)
HX CO2: 34 meq/L — ABNORMAL HIGH (ref 20–30)
HX CREATININE (CR): 2.12 mg/dL — ABNORMAL HIGH (ref 0.57–1.30)
HX CREATININE (CR): 1.89 mg/dL — ABNORMAL HIGH (ref 0.57–1.30)
HX GFR, AFRICAN AMERICAN: 26 mL/min/{1.73_m2} — ABNORMAL LOW
HX GFR, AFRICAN AMERICAN: 30 mL/min/{1.73_m2} — ABNORMAL LOW
HX GFR, NON-AFRICAN AMERICAN: 22 mL/min/{1.73_m2} — ABNORMAL LOW
HX GFR, NON-AFRICAN AMERICAN: 26 mL/min/{1.73_m2} — ABNORMAL LOW
HX GLUCOSE: 157 mg/dL — ABNORMAL HIGH (ref 70–139)
HX GLUCOSE: 244 mg/dL — ABNORMAL HIGH (ref 70–139)
HX POTASSIUM (K): 4.1 meq/L (ref 3.6–5.1)
HX POTASSIUM (K): 4.1 meq/L (ref 3.6–5.1)
HX SODIUM (NA): 148 meq/L — ABNORMAL HIGH (ref 135–145)
HX SODIUM (NA): 144 meq/L (ref 135–145)

## 2019-08-30 LAB — HX MICRO

## 2019-08-30 LAB — HX POINT OF CARE
HX GLUCOSE-POCT: 101 mg/dL (ref 70–139)
HX GLUCOSE-POCT: 165 mg/dL — ABNORMAL HIGH (ref 70–139)
HX GLUCOSE-POCT: 244 mg/dL — ABNORMAL HIGH (ref 70–139)
HX GLUCOSE-POCT: 126 mg/dL (ref 70–139)

## 2019-08-30 LAB — HX CHEM-OTHER
HX CALCIUM (CA): 8.1 mg/dL — ABNORMAL LOW (ref 8.5–10.5)
HX CALCIUM (CA): 7.9 mg/dL — ABNORMAL LOW (ref 8.5–10.5)
HX MAGNESIUM: 2.2 mg/dL (ref 1.6–2.6)
HX MAGNESIUM: 3.1 mg/dL — ABNORMAL HIGH (ref 1.6–2.6)
HX PHOSPHORUS: 3.1 mg/dL (ref 2.7–4.5)
HX PHOSPHORUS: 3.5 mg/dL (ref 2.7–4.5)

## 2019-08-30 LAB — HX DIABETES
HX GLUCOSE: 157 mg/dL — ABNORMAL HIGH (ref 70–139)
HX GLUCOSE: 244 mg/dL — ABNORMAL HIGH (ref 70–139)

## 2019-08-31 LAB — HX POINT OF CARE
HX GLUCOSE-POCT: 163 mg/dL — ABNORMAL HIGH (ref 70–139)
HX GLUCOSE-POCT: 123 mg/dL (ref 70–139)
HX GLUCOSE-POCT: 136 mg/dL (ref 70–139)

## 2019-08-31 LAB — HX CHEM-OTHER
HX % IRON SATURATION: 11 % — ABNORMAL LOW (ref 15–40)
HX CALCIUM (CA): 7.6 mg/dL — ABNORMAL LOW (ref 8.5–10.5)
HX FERRITIN: 91 ng/mL (ref 10–240)
HX IRON: 22 ug/dL — ABNORMAL LOW (ref 37–170)
HX MAGNESIUM: 2.6 mg/dL (ref 1.6–2.6)
HX PHOSPHORUS: 3 mg/dL (ref 2.7–4.5)
HX TOTAL IRON BINDING CAPACITY: 192 ug/dL — ABNORMAL LOW (ref 253–463)
HX TRANSFERRIN: 137 mg/dL — ABNORMAL LOW (ref 181–331)

## 2019-08-31 LAB — HX HEM-ROUTINE
HX BASO #: 0 10*3/uL (ref 0.0–0.2)
HX BASO: 0 %
HX EOSIN #: 0.2 10*3/uL (ref 0.0–0.5)
HX EOSIN: 3 %
HX HCT: 25.9 % — ABNORMAL LOW (ref 32.0–45.0)
HX HGB: 7.7 g/dL — ABNORMAL LOW (ref 11.0–15.0)
HX IMMATURE GRANULOCYTE#: 0.1 10*3/uL (ref 0.0–0.1)
HX IMMATURE GRANULOCYTE: 1 %
HX LYMPH #: 1.1 10*3/uL (ref 1.0–4.0)
HX LYMPH: 13 %
HX MCH: 28.9 pg (ref 26.0–34.0)
HX MCHC: 29.7 g/dL — ABNORMAL LOW (ref 32.0–36.0)
HX MCV: 97.4 fL (ref 80.0–98.0)
HX MONO #: 0.5 10*3/uL (ref 0.2–0.8)
HX MONO: 5 %
HX MPV: 11.9 fL — ABNORMAL HIGH (ref 9.1–11.7)
HX NEUT #: 7.1 10*3/uL (ref 1.5–7.5)
HX NRBC #: 0 10*3/uL
HX NUCLEATED RBC: 0 %
HX PLT: 243 10*3/uL (ref 150–400)
HX RBC BLOOD COUNT: 2.66 M/uL — ABNORMAL LOW (ref 3.70–5.00)
HX RDW: 14.1 % (ref 11.5–14.5)
HX SEG NEUT: 78 %
HX WBC: 9.1 10*3/uL (ref 4.0–11.0)

## 2019-08-31 LAB — HX CHEM-PANELS
HX ANION GAP: 7 (ref 3–14)
HX BLOOD UREA NITROGEN: 60 mg/dL — ABNORMAL HIGH (ref 6–24)
HX CHLORIDE (CL): 108 meq/L (ref 98–110)
HX CO2: 30 meq/L (ref 20–30)
HX CREATININE (CR): 1.9 mg/dL — ABNORMAL HIGH (ref 0.57–1.30)
HX GFR, AFRICAN AMERICAN: 29 mL/min/{1.73_m2} — ABNORMAL LOW
HX GFR, NON-AFRICAN AMERICAN: 25 mL/min/{1.73_m2} — ABNORMAL LOW
HX GLUCOSE: 166 mg/dL — ABNORMAL HIGH (ref 70–139)
HX POTASSIUM (K): 5 meq/L (ref 3.6–5.1)
HX SODIUM (NA): 145 meq/L (ref 135–145)

## 2019-08-31 LAB — HX DIABETES: HX GLUCOSE: 166 mg/dL — ABNORMAL HIGH (ref 70–139)

## 2019-09-01 LAB — HX POINT OF CARE
HX GLUCOSE-POCT: 144 mg/dL — ABNORMAL HIGH (ref 70–139)
HX GLUCOSE-POCT: 167 mg/dL — ABNORMAL HIGH (ref 70–139)
HX GLUCOSE-POCT: 253 mg/dL — ABNORMAL HIGH (ref 70–139)

## 2019-09-01 LAB — HX BF-CHEM/URINE
HX CREATININE FOR URINE MAGNESIUM: 29 mg/dL
HX MAGNESIUM, RANDOM URINE: 193 mg/g{creat} — ABNORMAL HIGH

## 2019-09-01 LAB — HX CHEM-PANELS
HX ANION GAP: 4 (ref 3–14)
HX BLOOD UREA NITROGEN: 51 mg/dL — ABNORMAL HIGH (ref 6–24)
HX CHLORIDE (CL): 110 meq/L (ref 98–110)
HX CO2: 27 meq/L (ref 20–30)
HX CREATININE (CR): 1.63 mg/dL — ABNORMAL HIGH (ref 0.57–1.30)
HX GFR, AFRICAN AMERICAN: 35 mL/min/{1.73_m2} — ABNORMAL LOW
HX GFR, NON-AFRICAN AMERICAN: 31 mL/min/{1.73_m2} — ABNORMAL LOW
HX GLUCOSE: 161 mg/dL — ABNORMAL HIGH (ref 70–139)
HX POTASSIUM (K): 4.3 meq/L (ref 3.6–5.1)
HX SODIUM (NA): 141 meq/L (ref 135–145)

## 2019-09-01 LAB — HX CHEM-OTHER
HX CALCIUM (CA): 7.6 mg/dL — ABNORMAL LOW (ref 8.5–10.5)
HX MAGNESIUM: 2.1 mg/dL (ref 1.6–2.6)
HX PHOSPHORUS: 2.3 mg/dL — ABNORMAL LOW (ref 2.7–4.5)

## 2019-09-01 LAB — HX DIABETES: HX GLUCOSE: 161 mg/dL — ABNORMAL HIGH (ref 70–139)

## 2019-09-02 LAB — HX CHEM-PANELS
HX ANION GAP: 6 (ref 3–14)
HX BLOOD UREA NITROGEN: 45 mg/dL — ABNORMAL HIGH (ref 6–24)
HX CHLORIDE (CL): 107 meq/L (ref 98–110)
HX CO2: 29 meq/L (ref 20–30)
HX CREATININE (CR): 1.48 mg/dL — ABNORMAL HIGH (ref 0.57–1.30)
HX GFR, AFRICAN AMERICAN: 40 mL/min/{1.73_m2} — ABNORMAL LOW
HX GFR, NON-AFRICAN AMERICAN: 34 mL/min/{1.73_m2} — ABNORMAL LOW
HX GLUCOSE: 145 mg/dL — ABNORMAL HIGH (ref 70–139)
HX POTASSIUM (K): 4.1 meq/L (ref 3.6–5.1)
HX SODIUM (NA): 142 meq/L (ref 135–145)

## 2019-09-02 LAB — HX MICRO

## 2019-09-02 LAB — HX POINT OF CARE
HX GLUCOSE-POCT: 114 mg/dL (ref 70–139)
HX GLUCOSE-POCT: 121 mg/dL (ref 70–139)
HX GLUCOSE-POCT: 174 mg/dL — ABNORMAL HIGH (ref 70–139)
HX GLUCOSE-POCT: 156 mg/dL — ABNORMAL HIGH (ref 70–139)
HX GLUCOSE-POCT: 176 mg/dL — ABNORMAL HIGH (ref 70–139)
HX GLUCOSE-POCT: 154 mg/dL — ABNORMAL HIGH (ref 70–139)

## 2019-09-02 LAB — HX CHEM-OTHER
HX CALCIUM (CA): 7.7 mg/dL — ABNORMAL LOW (ref 8.5–10.5)
HX MAGNESIUM: 1.9 mg/dL (ref 1.6–2.6)
HX PHOSPHORUS: 3.4 mg/dL (ref 2.7–4.5)

## 2019-09-02 LAB — HX DIABETES: HX GLUCOSE: 145 mg/dL — ABNORMAL HIGH (ref 70–139)

## 2019-09-03 LAB — HX BF-URINALYSIS
HX HYALINE CAST: 1.3 /LPF
HX KETONES: NEGATIVE mg/dL
HX LEUKOCYTE ES: NEGATIVE
HX NITRITE LEVEL: NEGATIVE
HX RED BLOOD CELLS: <1 /HPF
HX SPECIFIC GRAVITY: 1.015
HX SQUAMOUS EPITHELIAL CELLS: NEGATIVE /HPF
HX U BACTERIA: NEGATIVE
HX U BILIRUBIN: NEGATIVE
HX U BLOOD: NEGATIVE
HX U PH: 8.5
HX U UROBILINIG: 0.2 EU
HX WHITE BLOOD CELLS: <1 /HPF

## 2019-09-03 LAB — HX POINT OF CARE
HX GLUCOSE-POCT: 170 mg/dL — ABNORMAL HIGH (ref 70–139)
HX GLUCOSE-POCT: 212 mg/dL — ABNORMAL HIGH (ref 70–139)
HX GLUCOSE-POCT: 174 mg/dL — ABNORMAL HIGH (ref 70–139)
HX GLUCOSE-POCT: 217 mg/dL — ABNORMAL HIGH (ref 70–139)

## 2019-09-04 LAB — HX POINT OF CARE
HX GLUCOSE-POCT: 181 mg/dL — ABNORMAL HIGH (ref 70–139)
HX GLUCOSE-POCT: 239 mg/dL — ABNORMAL HIGH (ref 70–139)
HX GLUCOSE-POCT: 190 mg/dL — ABNORMAL HIGH (ref 70–139)
HX GLUCOSE-POCT: 199 mg/dL — ABNORMAL HIGH (ref 70–139)

## 2019-09-04 LAB — HX HEM-ROUTINE
HX BASO #: 0 10*3/uL (ref 0.0–0.2)
HX BASO: 0 %
HX EOSIN #: 0.2 10*3/uL (ref 0.0–0.5)
HX EOSIN: 4 %
HX HCT: 21 % — ABNORMAL LOW (ref 32.0–45.0)
HX HGB: 6.7 g/dL — AB (ref 11.0–15.0)
HX IMMATURE GRANULOCYTE#: 0.2 10*3/uL — ABNORMAL HIGH (ref 0.0–0.1)
HX IMMATURE GRANULOCYTE: 3 %
HX LYMPH #: 0.3 10*3/uL — ABNORMAL LOW (ref 1.0–4.0)
HX LYMPH: 6 %
HX MCH: 29.5 pg (ref 26.0–34.0)
HX MCHC: 31.9 g/dL — ABNORMAL LOW (ref 32.0–36.0)
HX MCV: 92.5 fL (ref 80.0–98.0)
HX MONO #: 0.3 10*3/uL (ref 0.2–0.8)
HX MONO: 6 %
HX MPV: 11.2 fL (ref 9.1–11.7)
HX NEUT #: 4.6 10*3/uL (ref 1.5–7.5)
HX NRBC #: 0 10*3/uL
HX NUCLEATED RBC: 0 %
HX PLT: 225 10*3/uL (ref 150–400)
HX RBC BLOOD COUNT: 2.27 M/uL — ABNORMAL LOW (ref 3.70–5.00)
HX RDW: 14.5 % (ref 11.5–14.5)
HX SEG NEUT: 81 %
HX TOT CELLS CN: 116
HX WBC: 5.7 10*3/uL (ref 4.0–11.0)

## 2019-09-04 LAB — HX CHEM-PANELS
HX ANION GAP: 5 (ref 3–14)
HX BLOOD UREA NITROGEN: 39 mg/dL — ABNORMAL HIGH (ref 6–24)
HX CHLORIDE (CL): 105 meq/L (ref 98–110)
HX CO2: 27 meq/L (ref 20–30)
HX CREATININE (CR): 1.04 mg/dL (ref 0.57–1.30)
HX GFR, AFRICAN AMERICAN: 61 mL/min/{1.73_m2}
HX GFR, NON-AFRICAN AMERICAN: 53 mL/min/{1.73_m2} — ABNORMAL LOW
HX GLUCOSE: 184 mg/dL — ABNORMAL HIGH (ref 70–139)
HX POTASSIUM (K): 4.1 meq/L (ref 3.6–5.1)
HX SODIUM (NA): 137 meq/L (ref 135–145)

## 2019-09-04 LAB — HX DIABETES: HX GLUCOSE: 184 mg/dL — ABNORMAL HIGH (ref 70–139)

## 2019-09-04 LAB — HX CHEM-OTHER
HX CALCIUM (CA): 7.7 mg/dL — ABNORMAL LOW (ref 8.5–10.5)
HX MAGNESIUM: 1.8 mg/dL (ref 1.6–2.6)
HX PHOSPHORUS: 2.9 mg/dL (ref 2.7–4.5)

## 2019-09-05 LAB — HX POINT OF CARE
HX GLUCOSE-POCT: 225 mg/dL — ABNORMAL HIGH (ref 70–139)
HX GLUCOSE-POCT: 210 mg/dL — ABNORMAL HIGH (ref 70–139)
HX GLUCOSE-POCT: 179 mg/dL — ABNORMAL HIGH (ref 70–139)
HX GLUCOSE-POCT: 136 mg/dL (ref 70–139)

## 2019-09-06 LAB — HX POINT OF CARE
HX GLUCOSE-POCT: 193 mg/dL — ABNORMAL HIGH (ref 70–139)
HX GLUCOSE-POCT: 210 mg/dL — ABNORMAL HIGH (ref 70–139)
HX GLUCOSE-POCT: 130 mg/dL (ref 70–139)
HX GLUCOSE-POCT: 207 mg/dL — ABNORMAL HIGH (ref 70–139)

## 2019-09-06 LAB — HX MICRO-RESP VIRAL PANEL: HX COVID-19 (SARS-COV-2) ADMIT SCREEN: NEGATIVE

## 2019-09-07 LAB — HX POINT OF CARE
HX GLUCOSE-POCT: 221 mg/dL — ABNORMAL HIGH (ref 70–139)
HX GLUCOSE-POCT: 151 mg/dL — ABNORMAL HIGH (ref 70–139)
HX GLUCOSE-POCT: 233 mg/dL — ABNORMAL HIGH (ref 70–139)
HX GLUCOSE-POCT: 208 mg/dL — ABNORMAL HIGH (ref 70–139)

## 2019-09-08 ENCOUNTER — Ambulatory Visit

## 2019-09-08 LAB — HX POINT OF CARE
HX GLUCOSE-POCT: 229 mg/dL — ABNORMAL HIGH (ref 70–139)
HX GLUCOSE-POCT: 190 mg/dL — ABNORMAL HIGH (ref 70–139)

## 2019-09-08 NOTE — Progress Notes (Signed)
* * *      [name], [name] **DOB:** [birth year] [id number] yo F) **Acc No.** [id number] **DOS:**  09/08/2019    ---       [name], [name]**    ------    50 Y old Female, DOB: Jul 03, 1945    [address] [place], [place] [address]    Home: [phone]    Provider: Phillips Odor        * * *    Telephone Encounter    ---    Answered by  Phillips Odor Date: 09/08/2019       Time: 10:09 AM    Reason  Hospital follow-up    ------            Message                     Schedule a follow-up appointment with Julie/Rachel in 2-3 weeks.  And open the encounter with them, however is going to see her.            FYI-patient has type 2 diabetes, was only on Metformin at home, currently on tube feeds and that is why we had to give her Lantus and lispro inpatient.  He will be going to rehab.  She has metastatic pancreatic cancer and unable to eat much.                Action Taken                     [facility]  09/08/2019 10:11:04 AM >      Seyler,Jessica  09/08/2019 10:57:43 AM > used spanish interpreter to call & her brother picked up & he didn't want to give any information nor book a TL appt for her.      Shadia Larose  09/08/2019 11:25:10 AM >                     * * *                ---          * * *         Provider: Phillips Odor 09/08/2019    ---    Note generated by eClinicalWorks EMR/PM Software ([email or web address])

## 2019-09-17 ENCOUNTER — Ambulatory Visit: Admitting: Surgery

## 2019-09-17 ENCOUNTER — Ambulatory Visit: Admit: 2019-09-17 | Payer: 59

## 2019-09-17 NOTE — Progress Notes (Signed)
 .  Progress Notes  .  Patient: [name]  Provider: Foster Simpson  DOB: 1946/04/13 Age: 74 Y Sex: Female  .  PCP: Sherrill Raring  MD  Date: 09/17/2019  .  --------------------------------------------------------------------------------  .  REASON FOR APPOINTMENT  .  1. POST-OP GASTRIC JEJUNOSTOMY  .  HISTORY OF PRESENT ILLNESS  .  Risk Assessment:  Is Patient a Fall Risk?  .  :Yes, fall risk procedures implemented per hospital policy  .   [name] is a pleasant 74 year old gentleman who is here  today following a gastrojejunostomy for pancreatic head  adinoarcinoma on August 30, 2019. She was scheduled to undergo a  pancreaticoduodenectomy, unfortunately the SMV was involved and  she is a Jehovah's witness so a gastrojejunostomy was performed  instead. She is feeling well at this time and her appetite is  improving. She remains in rehab however is planning to be  discharged home in the near future. She denies nausea, vomiting,  diarrhea, constipation, fevers, and chills. She is here for her  postperative appointment.  .  CURRENT MEDICATIONS  .  Taking Acetaminophen 325 MG Capsule Orally every 6 hrs prn  Taking Aspirin 81 mg once a day  Taking Calcium Carb-Cholecalciferol 500-200 MG-UNIT Tablet 1  capsule with a meal Orally twice daily  Taking Cyanocobalamin 1000 MCG Tablet as directed Orally Once a  day  Taking Doxazosin Mesylate 1 MG Tablet 1 tablet Orally Once a day  Taking Dulcolax 10 MG Suppository Rectal as needed  Taking Enoxaparin 40mg /0.40mL 40mg  subqytaneous once a day  Taking Famotidine 20 MG Tablet 1 tablet Orally twice daily  Taking Fleet Enema 7-19 GM/118ML Enema Rectal as needed  Taking Folic Acid 400 MCG Tablet 1 tablet Orally daily  Taking Glucagon Emergency  Taking HumuLIN R 100 UNIT/ML Solution 60 UNITS Injection in TPN  bag  Taking ibuprofen 600mg  q6hrs prn  Taking Magnesium Oxide 250 MG Tablet as directed Orally once  daily  Taking Metoprolol Tartrate 50 MG Tablet as directed Orally  Twice  a day  Taking Ondansetron HCl 4 MG Tablet 1 tablet as needed Orally  every 8 hrs  Taking oxyCODONE HCl 5 MG Tablet as directed Orally every 6 hrs  Taking Prochlorperazine Maleate 10 MG Tablet 1 tablet as needed  Orally every 6 hrs as needed  Taking protonix 20 mg once a day  Taking Reglan (metoclopramide)  .  PAST MEDICAL HISTORY  .  T2DM  HTN  HLD  Multifocal atrial tachycardia  Chronic pancreatitis and duodenal stricture  Malignant pancreatic head lesion  .  ALLERGIES  .  N.K.D.A.  .  SURGICAL HISTORY  .  gastrojejunostomy 08/2019  .  SOCIAL HISTORY  .  .  Tobaccohistory:Never smoked.  .  Alcohol Denies.  .  Abuse/NeglectDo you feel unsafe in your relationships?No , Have  you ever been hit, kicked, punched or otherwise hurt by someone  in the past year? No.  .  lives with son and daughter in law-providing care.  Marland Kitchen  REVIEW OF SYSTEMS  .  Surgery:  .  Constitutional:    no fever, chills, or general weakness, no  recent weight change . H&N:    no ear pain, no hoarseness, no  headache . Skin:    no skin lesions, no rash . Lymphatics:    no  swollen glands, no painful glands . Heart:    no chest pain, no  fluttering of heart . Lung:  no shortness of breath, no new or  frequent cough . Gastro:    no nausea/vomiting, no abdominal pain  or cramps, no constipation or diarrhea, no blood in stool, no  heartburn, no regurgitation . Urine:    no blood in urine, no  pain or burning while urinating . Neuro:    no dizzy spells,  fainting, no visual loss, no speech disturbance, no motor or  sensory events, no seizures . Musc:    no bone or joint pain .  Marland Kitchen  VITAL SIGNS  .  Pain scale 0, Wt-lbs stretcher, BP 160/84, HR 81, RR 16, O2 97,  Temp 97.8.  Marland Kitchen  PHYSICAL EXAMINATION  .  Surgery:  General Appearance  alert, oriented, and in no distress.  Gastrointestinal  abdomen soft and nontender with no masses  appreciated, well healed surgical incision, no draiange, no  erythema.  .  ASSESSMENTS  .  Malignant neoplasm of head of  pancreas - C25.0 (Primary)  .  TREATMENT  .  Malignant neoplasm of head of pancreas  Notes: [name] is doing well following a gastrojenunostomy on  August 30, 2019. Her incision is clean and dry. She understands  that a pancreaticoduodenectomy could not be performed because her  tumor was too extensive. She is scheduled to meet with onlology  in the near future to discuss chemotherapy. She will contact our  office with any further questions or concerns that arise.  .  Thank you for allowing [place] to participate in the care of your  patient. If you have any questions or comments, please do not  hesitate to contact our office.  Marland Kitchen  PROCEDURES  .  Patient [name], [name] Med Rec #:  [id number]:Operation 08/30/2019 Pt.Dt: Location:.Marland Kitchen OPERATIVE  REPORT.Marland KitchenPREOPERATIVE DIAGNOSIS: Pancreatic cancer.Marland KitchenPOSTOPERATIVE  DIAGNOSIS: Pancreatic cancer.Marland KitchenPROCEDURE: Gastrojejunostomy in an  antecolic fashion, Billroth II.Marland KitchenSURGEON: Andy Gauss,  M.D..ASSISTANT: Curly Shores, M.D..ANESTHESIA: General  endotracheal..ESTIMATED BLOOD LOSS: 100 mL.Marland KitchenDRAINS:  None..SPECIMENS: None.Marland KitchenFINDINGS: The patient with inflammation in  the right upper quadrant. Theentire pancreas was quite hard. The  superior mesenteric vein was beingpulled in to where the tumor  was. Around the porta hepatis was also quiteinflamed. We did not  see any other distant disease. There was concernthat the SMV was  involved in the area and with her being a Jehovah'sWitness and  starting with a low hemoglobin ____ it would not be  beneficialsurgical procedure for her as well as potentially  detrimental..COMPLICATIONS: None..DESCRIPTION OF PROCEDURE: The  patient was taken to the operating room andplaced on the  operating room table. The patient was then anesthetized  andintubated without difficulties. Her abdomen was then prepped  and draped inthe usual sterile fashion. Midline incision was  made. Upon entering ____,exploration of the abdomen revealed no  other  pathology. At this point intime, we took the hepatic  flexure down, which we had some greatdifficulties in doing due to  the fact that there was a large amount ofinflammation. We then  attempted to try and kocherize the duodenum and onceagain this  area was quite hard and inflamed. After slowly taking thisdown,  we identified the inferior vena cava and we were able to  comeunderneath the head of the pancreas. We were able to feel the  SMAproximally, but distally, we had some difficulties in feeling  it. Weturned our attention to the omentum and took it off the  transverse colonand transverse mesocolon to get into the lesser  sac. In doing so, we camealong the inferior border of the  pancreas in trying to identify thesuperior mesenteric vein. Near  the superior mesenteric vein, there weresome other dilated veins,  which we took down using the LigaSure device.This was somewhat  anatomical variant versus portal hypertension. Once wedid this,  we had some difficulties in getting underneath the neck of  thepancreas, but we noted near the SMV area, there was some tumor  that was onthe side or a vein that was being quite adherent that  we could not dissectoff. We opted to turn our attention to the  porta hepatis, which was quiteinflamed. We came along the  superior border of the pancreas, taking thelymph node off the  hepatic artery to identify the gastroduodenal artery.We dissected  out the peritoneum overlying the porta hepatis. The commonbile  duct seemed quite large. There was known stent within it. We  hadsome great difficulties in trying to do this, but we were able  to getaround the common bile duct and a vessel loop was placed.  We continueddissection in this area to find the gastroduodenal  artery, which weeventually did. During this time, we had small  areas of bleeding. Thepatient was known to have a hemoglobin  starting off at 7.3 at this point intime. With her being a  Jehovah's Witness and noting that it is  possiblethat we would not  be able to remove the tumor and concern that we couldhave some  extra blood loss due to the small vessels with a fair amount  ofinflammation, we felt it safe for not to do the resection at  this point intime due to tumor issues as well as her blood  counts. We went ahead andbrought a loop of jejunum approximately  25 cm distal to the ligament ofTreitz in an antecolic fashion to  the posterior wall of the stomach. Wedid a gastrojejunostomy  using a GIA 3.8 mm stapler. The defect was closedusing a TA 3.5  mm stapler. Once this was done, Seprafilm was placed intothe  abdomen. On-Q pain catheters were placed on each side of the  abdomengoing through the oblique musculature just staying above  the peritonealsurface. These were each bolused with 10 mL of  0.25% Marcaine. These weresecured in place using benzoin,  Steri-Strips, and Tegaderms. The midlinefascia was reapproximated  using #1 PDS suture in a running fashion. Theskin was then  reapproximated using skin clips. The area was cleaned offusing  wet-to-dry. A sterile dressing was then placed in the wound.  Thepatient was then awoken and extubated without any  difficulties. Thepatient was then taken off the operating table  and placed on a stretcherand returned to recovery room. At the  end of procedure, all needle,sponge, and instrument counts were  correct. I was present for the entirecase..Electronically  Clois Dupes, MD 10/01/2019 04:07 P.Marland KitchenMarland KitchenMarland KitchenDictated by:  Foster Simpson, MD.D: 03/24/2021T: 09/08/2019 01:09 PDictation  ID: [id number]/Doc# [id number].cc:.. Document is preliminary until  electronically or manually signed by attending physician.  .  FOLLOW UP  .  prn  .  Electronically signed by Andy Gauss , MD on  11/26/2019 at 09:48 AM EDT  .  Document electronically signed by Foster Simpson   .

## 2019-09-17 NOTE — Progress Notes (Signed)
 * * *    [name], [name] **DOB:** [birth year] [phone] F) **Acc No.** [id number] **DOS:**  09/17/2019    ---       [name], [name]**    ------    6 Y old Female, DOB: 1945/12/16, External MRN: [id number]    Account Number: [id number]    [address] [place]    Home: [phone]    Insurance: [employer]ll[employer] Gross    PCP: Sherrill Raring, MD Referring: Sherrill Raring, MD    Appointment Facility: [facility]        * * *    09/17/2019 Progress Notes: Andy Gauss, MD **CHN#:** [phone]    ------    ---       **Reason for Appointment**    ---      1\. POST-OP GASTRIC JEJUNOSTOMY    ---      **History of Present Illness**    ---     _Risk Assessment_ :    Is Patient a Fall Risk? : Yes, fall risk procedures implemented per hospital  policy .    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 She was  scheduled to undergo a pancreaticoduodenectomy, unfortunately the SMV was  involved and she is a Jehovah's witness so a gastrojejunostomy was performed  instead. She is feeling well at this time and her appetite is improving. She  remains in rehab however is planning to be discharged home in the near future.  She denies nausea, vomiting, diarrhea, constipation, fevers, and chills. She  is here for her postperative appointment.      **Current Medications**    ---    Taking    * Acetaminophen 325 MG Capsule Orally every 6 hrs prn    ---    * Aspirin 81 mg once a day    ---    * Calcium Carb-Cholecalciferol 500-200 MG-UNIT Tablet 1 capsule with a meal Orally twice daily    ---    * Cyanocobalamin 1000 MCG Tablet as directed Orally Once a day    ---    * Doxazosin Mesylate 1 MG Tablet 1 tablet Orally Once a day    ---    * Dulcolax 10 MG Suppository Rectal as needed    ---    * Enoxaparin 40mg /0.72mL 40mg  subqytaneous once a day    ---    * Famotidine 20 MG Tablet 1 tablet Orally twice daily    ---    * Fleet  Enema 7-19 GM/118ML Enema Rectal as needed    ---    * Folic Acid 400 MCG Tablet 1 tablet Orally daily    ---    * Glucagon Emergency     ---    * HumuLIN R 100 UNIT/ML Solution 60 UNITS Injection in TPN bag    ---    * ibuprofen 600mg  q6hrs prn    ---    * Magnesium Oxide 250 MG Tablet as directed Orally once daily    ---    * Metoprolol Tartrate 50 MG Tablet as directed Orally Twice a day    ---    * Ondansetron HCl 4 MG Tablet 1 tablet as needed Orally every 8 hrs    ---    * oxyCODONE HCl 5 MG Tablet as directed Orally every 6 hrs    ---    * Prochlorperazine Maleate 10 MG Tablet 1 tablet as needed Orally  every 6 hrs as needed    ---    * protonix 20 mg once a day    ---    * Reglan (metoclopramide)     ---     **Past Medical History**    ---      T2DM.        ---    HTN.        ---    HLD.        ---    Multifocal atrial tachycardia.        ---    Chronic pancreatitis and duodenal stricture.        ---    Malignant pancreatic head lesion.        ---      **Surgical History**    ---      gastrojejunostomy 08/2019    ---      **Social History**    ---    Tobacco  history: Never smoked.    Alcohol  Denies.    Abuse/Neglect  Do you feel unsafe in your relationships? No, Have you ever  been hit, kicked, punched or otherwise hurt by someone in the past year? No.   lives with son and daughter in law-providing care.    ---      **Allergies**    ---      N.K.D.A.    ---      **Review of Systems**    ---     _Surgery_ :    Constitutional: no fever, chills, or general weakness, no recent weight  change. H&N: no ear pain, no hoarseness, no headache. Skin: no skin lesions,  no rash. Lymphatics: no swollen glands, no painful glands. Heart: no chest  pain, no fluttering of heart. Lung: no shortness of breath, no new or frequent  cough. Gastro: no nausea/vomiting, no abdominal pain or cramps, no  constipation or diarrhea, no blood in stool, no heartburn, no regurgitation.  Urine: no blood in urine, no pain or burning  while urinating. Neuro: no dizzy  spells, fainting, no visual loss, no speech disturbance, no motor or sensory  events, no seizures. Musc: no bone or joint pain.         **Vital Signs**    ---    Pain scale 0, Wt-lbs stretcher, BP 160/84, HR 81, RR 16, O2 97, Temp 97.8.      **Physical Examination**    ---     _Surgery_ :    General Appearance alert, oriented, and in no distress. Gastrointestinal  abdomen soft and nontender with no masses appreciated, well healed surgical  incision, no draiange, no erythema.         **Assessments**    ---    1\. 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 Malignant neoplasm of head of pancreas**    Notes: [name] is doing well following a gastrojenunostomy on August 30, 2019. Her incision is clean and dry. She understands that a  pancreaticoduodenectomy could not be performed because her tumor was too  extensive. She is scheduled to meet with onlology in the near future to  discuss chemotherapy. She will contact our office with any further questions  or concerns that arise.        Thank you for allowing [place] to participate in the care of your patient. If you  have any  questions or comments, please do not hesitate to contact our office.    ---     **Procedures**    ---    Patient [name], [name] Med Rec #: [id number]    Name:    Operation 08/30/2019 Pt.    Dt: Location:    .    Marland Kitchen    OPERATIVE REPORT    .    Marland Kitchen    PREOPERATIVE DIAGNOSIS: Pancreatic cancer.    Marland Kitchen    POSTOPERATIVE DIAGNOSIS: Pancreatic cancer.    Marland Kitchen    PROCEDURE: Gastrojejunostomy in an antecolic fashion, Billroth II.    Marland Kitchen    SURGEON: Andy Gauss, M.D.    .    ASSISTANT: Curly Shores, M.D.    .    ANESTHESIA: General endotracheal.    .    ESTIMATED BLOOD LOSS: 100 mL.    Marland Kitchen    DRAINS: None.    .    SPECIMENS: None.    Marland Kitchen    FINDINGS: The patient with inflammation in the right upper quadrant. The    entire pancreas was quite hard. The superior mesenteric  vein was being    pulled in to where the tumor was. Around the porta hepatis was also quite    inflamed. We did not see any other distant disease. There was concern    that the SMV was involved in the area and with her being a Jehovah's    Witness and starting with a low hemoglobin ____ it would not be beneficial    surgical procedure for her as well as potentially detrimental.    .    COMPLICATIONS: None.    .    DESCRIPTION OF PROCEDURE: The patient was taken to the operating room and    placed on the operating room table. The patient was then anesthetized and    intubated without difficulties. Her abdomen was then prepped and draped in    the usual sterile fashion. Midline incision was made. Upon entering ____,    exploration of the abdomen revealed no other pathology. At this point in    time, we took the hepatic flexure down, which we had some great    difficulties in doing due to the fact that there was a large amount of    inflammation. We then attempted to try and kocherize the duodenum and once    again this area was quite hard and inflamed. After slowly taking this    down, we identified the inferior vena cava and we were able to come    underneath the head of the pancreas. We were able to feel the SMA    proximally, but distally, we had some difficulties in feeling it. We    turned our attention to the omentum and took it off the transverse colon    and transverse mesocolon to get into the lesser sac. In doing so, we came    along the inferior border of the pancreas in trying to identify the    superior mesenteric vein. Near the superior mesenteric vein, there were    some other dilated veins, which we took down using the LigaSure device.    This was somewhat anatomical variant versus portal hypertension. Once we    did this, we had some difficulties in getting underneath the neck of the    pancreas, but we noted near the SMV area, there was some tumor that was on    the side or a vein  that was being  quite adherent that we could not dissect    off. We opted to turn our attention to the porta hepatis, which was quite    inflamed. We came along the superior border of the pancreas, taking the    lymph node off the hepatic artery to identify the gastroduodenal artery.    We dissected out the peritoneum overlying the porta hepatis. The common    bile duct seemed quite large. There was known stent within it. We had    some great difficulties in trying to do this, but we were able to get    around the common bile duct and a vessel loop was placed. We continued    dissection in this area to find the gastroduodenal artery, which we    eventually did. During this time, we had small areas of bleeding. The    patient was known to have a hemoglobin starting off at 7.3 at this point in    time. With her being a Jehovah's Witness and noting that it is possible    that we would not be able to remove the tumor and concern that we could    have some extra blood loss due to the small vessels with a fair amount of    inflammation, we felt it safe for not to do the resection at this point in    time due to tumor issues as well as her blood counts. We went ahead and    brought a loop of jejunum approximately 25 cm distal to the ligament of    Treitz in an antecolic fashion to the posterior wall of the stomach. We    did a gastrojejunostomy using a GIA 3.8 mm stapler. The defect was closed    using a TA 3.5 mm stapler. Once this was done, Seprafilm was placed into    the abdomen. On-Q pain catheters were placed on each side of the abdomen    going through the oblique musculature just staying above the peritoneal    surface. These were each bolused with 10 mL of 0.25% Marcaine. These were    secured in place using benzoin, Steri-Strips, and Tegaderms. The midline    fascia was reapproximated using #1 PDS suture in a running fashion. The    skin was then reapproximated using skin clips. The area was cleaned off    using wet-to-dry. A  sterile dressing was then placed in the wound. The    patient was then awoken and extubated without any difficulties. The    patient was then taken off the operating table and placed on a stretcher    and returned to recovery room. At the end of procedure, all needle,    sponge, and instrument counts were correct. I was present for the entire    case.    .    Electronically Signed    Foster Simpson, MD 10/01/2019 04:07 P    .    Marland Kitchen    Dictated by: Foster Simpson, MD    .    D: 09/08/2019    T: 09/08/2019 01:09 P    Dictation ID: [id number]/Doc# [id number]    .    cc:    .    Marland Kitchen    Document is preliminary until electronically or manually signed by    attending physician.      **Follow Up**    ---  prn    Electronically signed by Andy Gauss , MD on 11/26/2019 at 09:48 AM EDT    Sign off status: Completed        * * *        [facility]    [address]    [place], [address]    [place], [place] [address]    Tel: [phone]    Fax: [phone]              * * *          Progress Note: Andy Gauss, MD 09/17/2019    ---    Note generated by eClinicalWorks EMR/PM Software ([email or web address])

## 2019-10-11 ENCOUNTER — Ambulatory Visit: Admitting: Hematology & Oncology

## 2019-10-12 ENCOUNTER — Ambulatory Visit: Admitting: Hematology & Oncology

## 2019-10-12 NOTE — Other (Addendum)
 ON[facility] Nurse-[facility] Intake Entered On:  10/12/2019 10:26     Performed On:  10/12/2019 10:23  by Ralene Cork               Vital Signs   Temperature Oral :   98.4 DegF   Peripheral Pulse Rate :   96 bpm   Respiratory Rate :   20 br/min   Systolic Blood Pressure :   143 mmHg (HI)    Diastolic Blood Pressure :   81 mmHg   Mean Arterial Pressure :   102 mmHg   Ralene Cork - 10/12/2019 10:23    Height/Weight   Height :   162.56 cm(Converted to: 5 foot 4 in, 64.00 in)    Type of Weight :   Standing   Type of Height :   Stated height   Ralene Cork - 10/12/2019 10:23    [facility] General Information/Subjective   Chief Complaint :   consult    High Risk for Falls :   No   Preferred Language :   Spanish   Preferred Communication Mode :   Verbal   Interpreter Decline :   Yes   Pain Symptoms :   No   Ralene Cork - 10/12/2019 10:23    [facility] Infectious Disease Risk Screening   Patient Travel History :   No recent travel   Recent Family Travel History :   No recent travel   Close contact w/ suspect case COVID-19 :   No   Close contact w/ lab-confirmed case COVID-19 :   No   Lab confirmed positive COVID-19 test result? :   No   Recently exposed to COVID-19? :   No   Ralene Cork - 10/12/2019 10:23    Allergy   (As Of: 10/12/2019 10:26:39 )   Allergies (Active)   NKA  Estimated Onset Date:   Unspecified ; Created By:   Ralene Cork; Reaction Status:   Active ; Category:   Drug ; Substance:   NKA ; Type:   Allergy ; Updated By:   Ralene Cork; Reviewed Date:   10/12/2019 10:26         Medication History   Medication List   (As Of: 10/12/2019 10:26:39 )        [facility] Social History   Cigarette Smoking Last 365 Days :   No   Exposure to Tobacco Smoke :   Never smoker   Patient used other tobacco products in the last 30 days? :   No   Ralene Cork - 10/12/2019 10:23    Social History   (As Of: 10/12/2019 10:26:39 )     Advance Directive   Advanced Directives :   Yes   Advance Directive Type :   Health Care Proxy   Ralene Cork - 10/12/2019 10:23    [facility] Encounter   Onc Type of Patient :   Outpatient Visit Existing   Onc Visit Level, Existing :   Level 2   Ralene Cork - 10/12/2019 10:23

## 2019-10-13 LAB — HX SURG PATH FINAL REPORT
CASE NUMBER: 0
HX NOTE: 88321

## 2019-10-20 ENCOUNTER — Ambulatory Visit

## 2019-10-28 ENCOUNTER — Telehealth: Payer: Self-pay | Admitting: Family Medicine

## 2019-10-28 NOTE — Telephone Encounter (Signed)
Ariel is Ms.Errickson's daughter in law, she is calling in because Desirey has an upcoming appointment in August for a new patient appt but has ran out of her test strips and has tried contacting her old pcp but because the patients old provider is in [place], and due to [employer] is not able to have a refill transferred here to [facility]. Ariel wanted to know what they can do to get more test strips.

## 2019-11-03 ENCOUNTER — Telehealth: Payer: Self-pay

## 2019-11-03 NOTE — Telephone Encounter (Signed)
Spoke with patient's daughter [name] regarding appointment.  Her  insurance is in effect on 11/16/2019 so desires appointment after that is effective.  I have scheduled patient for Monday 6/7 at 3 pm with Dr. Burr Medico and she was told to arrive at least 15 minutes prior to check in.  One person may accompany her to her appointment.  She was given our physical address.  She verbalized an understanding.   Her records from [facility] in [place] were sent to HIM for scanning to chart.

## 2019-11-04 ENCOUNTER — Ambulatory Visit (HOSPITAL_BASED_OUTPATIENT_CLINIC_OR_DEPARTMENT_OTHER): Admitting: Psychiatry

## 2019-11-09 ENCOUNTER — Ambulatory Visit: Payer: Self-pay | Admitting: Hematology

## 2019-11-19 NOTE — Progress Notes (Signed)
Tamara Wood   [phone] Fax:([phone]) [place] Note   Patient Care Team: Isaac Bliss, Rayford Halsted, MD as PCP - General (Internal Medicine) Jonnie Finner, RN as Oncology Nurse Navigator Truitt Merle, MD as Consulting Physician (Hematology)  Date of Service:  11/22/2019   CHIEF COMPLAINTS/PURPOSE OF CONSULTATION:  Pancreatic Cancer   REFERRING PHYSICIAN:  Self referral   Oncology History  Pancreatic cancer [facility])  06/04/2019 -  Hospital Admission   Admit date: 06/04/2019 Admission diagnosis: biliary obstruction  Additional comments: Patient presented with right upper quadrant abdominal pain, jaundice.  She was admitted to [facility] in [place].  She was noticed to have intrahepatic and extrahepatic biliary dilatation along with main pancreatic duct dilatation on CT 1 months prior.  MRCP showed a possible slight progression of the biliary and pancreatic duct dilatation.  Patient was evaluated by GI, underwent EGD.  Biopsy of the duodenum and increase did not review malignant cells, biopsy of pancreatic head showed chronic pancreatitis.  Patient underwent biliary drain placement by IR.  She was discharged home on June 12, 2019.   06/07/2019 Procedure   EUS with biopsy by Dr. Glade Stanford at [facility], [place].  Impression: Partial obstruction of the duodenal and the second portion in the expected area of the ampulla.  The overlying mucosa on this edematous folds which were causing the obstruction appeared normal with the exception of one area which was biopsied.    06/10/2019 Pathology Results   Common bile duct for cytology: Negative for malignant cells. Biopsy head of pancreas: chronic Pancreatitis   06/10/2019 Procedure   IR placed a biliary drain for obstructive jaundice    07/30/2019 Imaging   CT abdomen and pelvis with contrast showed increased moderate distention of duodenal bulb without frank gastric  distention.  Gallbladder absent, there is a stent in the low common bile duct without a significant dilatation.  There remains an external transhepatic biliary drain.  Increased to moderate dilatation of the pancreatic duct.  There is ill-defined fullness of the pancreatic head which is similar to prior exam.  The patient has underwent EUS aspiration and biliary stricture brushing without demonstration of tumor.  Pancreatic head the biopsy came back chronic pancreatitis.  Occult tumor remains possible.   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  Whipple procedure was planned but aborted because of him pancreatic mass was densely adherent to the mesentery vein.   11/21/2019 Initial Diagnosis   Pancreatic cancer (HCC)     HISTORY OF PRESENTING ILLNESS:  Tamara Wood 74 y.o. female is a here because of pancreatic cancer. The patient presents to the clinic today accompanied by daughter-in-law who helps translate for her. She speaks spanish and declined interpretation services for now.   She was previously living in [place], [place]. She had recurrent ED visits. She presented with right upper quadrant abdominal pain, jaundice and haematuria, hypotensive and hyperglycemia. She was admitted to [facility] in [place] on 06/04/19. She was noticed to have intrahepatic and extrahepatic biliary dilatation along with main pancreatic duct dilatation on CT 1 months prior.  MRCP showed a possible slight progression of the biliary and pancreatic duct dilatation.  Patient was evaluated by GI, underwent EGD. Biopsy of the duodenum and  increase did not review malignant cells, biopsy of pancreatic head showed chronic pancreatitis. Patient  underwent biliary drain placement by IR. She was discharged home on June 12, 2019. This draining tube was removed in April 2021.  She had imaging in 07/2019 which showed gastric detention and Increased to moderate dilatation of the pancreatic duct. She had biopsy of pancreatic head mass on 08/10/19 which showed Highly suspicious for invasive well differentiated adenocarcinoma. She had planned to undergo Whipple on 08/29/19 but aborted because of the pancreatic mass was densely adherent to the mesentery vein. She did have J tube placed.   Today she notes she still has mid lower back pain which is constant and dull. 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 When she moved to [place] in 09/1989 she had back surgery and had it removed. She notes she only consulted with Med Onc in [place]. She was told her cancer was no longer curable but may be eligible for chemo. She notes her J tube is bothering her and itching and bleeding around site. She notes she is not using feeding tube since leaving rehab. She was in Rehab for 1.5 months. She has been eating regularly by mouth. She has gained some weight back since surgery. Her energy level has much improved. She notes she was on Lovenox and still has been on it even after leaving rehab.  She notes she has had dysuria for the past 2 weeks.   She moved from [place], [place] where she lived with her husband to [place] in early May. She has 4 children in [place], only 1 of which is [place]. She is a non smoker and does not drink alcohol. She is able to take care of herself at home. She can ambulate well and can go outside. She has great assistance with her family.  She has a PMHx of HTN, DM. Her BG was 118-120 in the morning and sometime sin 200 range in the afternoon. Her HTN is mostly controlled. She had appendectomy and cholecystectomy.  She plans to see new PCP on 11/30/19. I reviewed her  medication list with her.    REVIEW OF SYSTEMS:    Constitutional: Denies fevers, chills or abnormal night sweats Eyes: Denies blurriness of vision, double vision or watery eyes Ears, nose, mouth, throat, and face: Denies mucositis or sore throat Respiratory: Denies cough, dyspnea or wheezes UA: (+) Dysuria  Cardiovascular: Denies palpitation, chest discomfort or lower extremity swelling Gastrointestinal:  Denies nausea, heartburn or change in bowel habits Skin: Denies abnormal skin rashes (+) Skin itching and blood around site.  Lymphatics: Denies new lymphadenopathy or easy bruising Neurological:Denies numbness, tingling or new weaknesses Behavioral/Psych: Mood is stable, no new changes  All other systems were reviewed with the patient and are negative.   MEDICAL HISTORY:  Past Medical History:  Diagnosis Date   Diabetes mellitus without complication ([place])    Hypertension    Pancreatic cancer ([place])     SURGICAL HISTORY: Past Surgical History:  Procedure Laterality Date   APPENDECTOMY     BACK SURGERY     CHOLECYSTECTOMY      SOCIAL HISTORY: Social History   Socioeconomic History   Marital status: Married    Spouse name: Not on file   Number of children: 6   Years of education: Not on file   Highest education level: Not on file  Occupational History   Not on file  Tobacco Use   Smoking  status: Never Smoker   Smokeless tobacco: Never Used  Substance and Sexual Activity   Alcohol use: Never   Drug use: Never   Sexual activity: Not on file  Other Topics Concern   Not on file  Social History Narrative   Not on file   Social Determinants of Health   Financial Resource Strain:    Difficulty of Paying Living Expenses:   Food Insecurity:    Worried About Charity fundraiser in the Last Year:    Arboriculturist in the Last Year:   Transportation Needs:    Film/video editor (Medical):    Lack of Transportation (Non-Medical):     Physical Activity:    Days of Exercise per Week:    Minutes of Exercise per Session:   Stress:    Feeling of Stress :   Social Connections:    Frequency of Communication with Friends and Family:    Frequency of Social Gatherings with Friends and Family:    Attends Religious Services:    Active Member of Clubs or Organizations:    Attends Music therapist:    Marital Status:   Intimate Partner Violence:    Fear of Current or Ex-Partner:    Emotionally Abused:    Physically Abused:    Sexually Abused:     FAMILY HISTORY: Family History  Problem Relation Age of Onset   Diabetes Mother    Diabetes Sister    Diabetes Brother     ALLERGIES:  has No Known Allergies.  MEDICATIONS:  Current Outpatient Medications  Medication Sig Dispense Refill   amLODipine (NORVASC) 5 MG tablet Take 1 tablet (5 mg total) by mouth daily. 30 tablet 0   ASPIRIN LOW DOSE 81 MG EC tablet Take 81 mg by mouth daily.     Calcium Carb-Cholecalciferol (CALCIUM/VITAMIN D) 500-200 MG-UNIT TABS SMARTSIG:1 Tablet(s) By Mouth Twice Daily     enoxaparin (LOVENOX) 40 MG/0.4ML injection      ibuprofen (ADVIL) 600 MG tablet Take 600 mg by mouth every 6 (six) hours as needed.     metFORMIN (GLUCOPHAGE) 1000 MG tablet Take 1 tablet (1,000 mg total) by mouth 2 (two) times daily. 30 tablet 0   metoCLOPramide (REGLAN) 10 MG tablet      metoprolol tartrate (LOPRESSOR) 50 MG tablet Take 1 tablet (50 mg total) by mouth 2 (two) times daily. 30 tablet 0   pantoprazole (PROTONIX) 20 MG tablet Take 1 tablet (20 mg total) by mouth daily. 90 tablet 1   traMADol (ULTRAM) 50 MG tablet Take 0.5-1 tablets (25-50 mg total) by mouth every 6 (six) hours as needed. 30 tablet 0   vitamin B-12 (CYANOCOBALAMIN) 1000 MCG tablet Take 1 tablet (1,000 mcg total) by mouth daily. 90 tablet 1   No current facility-administered medications for this visit.    PHYSICAL EXAMINATION: ECOG PERFORMANCE  STATUS: 2 - Symptomatic, <50% confined to bed  Vitals:   11/22/19 1515  BP: (!) 153/82  Pulse: 81  Resp: 20  Temp: (!) 97.3 F (36.3 C)  SpO2: 99%   Filed Weights   11/22/19 1515  Weight: 132 lb 11.2 oz (60.2 kg)    GENERAL:alert, no distress and comfortable SKIN: skin color, texture, turgor are normal, no rashes or significant lesions EYES: normal, Conjunctiva are pink and non-injected, sclera clear  NECK: supple, thyroid normal size, non-tender, without nodularity LYMPH:  no palpable lymphadenopathy in the cervical, axillary  LUNGS: clear to auscultation and percussion with normal  breathing effort HEART: regular rate & rhythm and no murmurs and no lower extremity edema ABDOMEN:abdomen soft, non-tender and normal bowel sounds (+) J tube in place, with glue around site, no discharge. Mid line surgical incision healed well.  Musculoskeletal:no cyanosis of digits and no clubbing  NEURO: alert & oriented x 3 with fluent speech, no focal motor/sensory deficits   LABORATORY DATA:  I have reviewed the data as listed No flowsheet data found.  No flowsheet data found.  I have reviewed her outside medical records extensively, including notes, pathology results, and imaging results.     ASSESSMENT & PLAN:  Resa Rinks is a 74 y.o. female with a h/o HTN, DM, appendectomy, Cholecystectomy, back surgery.    1. Pancreatic Cancer, unresectable  -I reviewed her outside medical records (more than a hundred pages) including imaging and pathology from [place]. She initially presented to hospital in [place] with RUQ abdominal and jaundice. Her work up showed intrahepatic and extrahepatic biliary dilatation. EGD with biopsy of duodenal mucosa and head of pancreas showed no cancer cells at that time. She underwent biliary drain placement by IR in 05/2020 which was removed in 09/2019.  -She underwent biopsy of pancreatic head mass on 08/10/19 which showed invasive well differentiated  adenocarcinoma, peripancreatic LN biopsy was also positive.  -She underwent Whipple Surgery on 08/29/19 but was aborted due to tumor invading the SMV. She instead had gastrojejunostomy and J-tube placement due to bowel blockages.  -Since surgery she has only consulted with Medical Oncology in [place].  She has not had any pancreatic cancer treatment.  I reviewed her case with her and discussed given her tumor is unresectable, her cancer is no longer curable.  -It has been 3 months since her surgery, I recommend CT CAP for new baseline. She is agreeable.   -We discussed the nature history of pancreatic cancer, and overall poor prognosis with very high risk of developing metastatic disease. -I discussed treatment options of chemo and Radiation. She is open to treatment if she can tolerate it. Will discuss chemo options after her scan. If she agrees to proceed with chemo, I discussed option of PAC placement given long term IV chemo. -Will obtain baseline labs tomorrow. F/u after scan   2. Nutrition  -During her 08/29/19 surgery her whipple was aborted and instead had bypass of duodenum and CBD due to blockages. She also had J-tube placed at that time. She did use this after surgery but has not used since she was discharged from rehab.  -She feels she eats adequately by mouth now.  -I will obtain surgical opinion about the glue around her site which is irritating her skin and removal. She is interested in removal.    3. Comorbidities: HTN, DM, H/o of spinal tumor, Chronic back pain  -On Medication  -She plans to see new PCP on 11/30/19. I refilled some of her medications today.  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 When she moved to [place] in 09/1989 she had back surgery and had it removed.  -Today she notes she still has mid lower back pain which is constant and dull. To aid in her pain I will call in Low dose Tramadol (11/22/19). She can start  with half tablets.  -She was placed on Lovenox injections post surgery and while she was in rehab. She has remained on injections since. I discussed as long as she is up and moving around adequately she can stop Lovenox injections.  4. Dysuria  -Onset 2 weeks ago. I will obtain UA and culture this week to evaluate for UTI. If she has UTI, I will call in antibiotics.   5. Social Support  -She is originally from [place] She moved to [place] in 09/1989. She is married with 4 children.  -After medical issues since late 05/2019 her son and his wife who are fluent in [place] moved to [place]. After cancer diagnosis, all of them moved to [place], [place] in early May given 3 of her children live in [place]. She has great family support  -She is a Jehovah Witness    PLAN:  -I refilled amlodipine, Metformin, Metoprolol, Protonix, B12 supplement  -I called in Tramadol.  -Lab tomorrow  -CT CAP W contrast in 1-2 weeks.  -F/u a few days after scan.    Orders Placed This Encounter  Procedures   Urine Culture    Standing Status:   Future    Standing Expiration Date:   11/21/2020   CT Chest W Contrast    Standing Status:   Future    Standing Expiration Date:   11/21/2020    Order Specific Question:   If indicated for the ordered procedure, I authorize the administration of contrast media per Radiology protocol    Answer:   Yes    Order Specific Question:   Preferred imaging location?    Answer:   [facility]    Order Specific Question:   Radiology Contrast Protocol - do NOT remove file path    Answer:   \[email or web address]   CT Abdomen Pelvis W Contrast    Pancreatic protocol. Pt was diagnosed at [facility] in Feb 2021, underwent gastrojejunostomy and J-tube placement at [place] was aborted due to SMV invasion from cancer. She has not received other cancer treatment. Restaging    Standing Status:   Future    Standing Expiration Date:    11/21/2020    Order Specific Question:   If indicated for the ordered procedure, I authorize the administration of contrast media per Radiology protocol    Answer:   Yes    Order Specific Question:   Preferred imaging location?    Answer:   [facility]    Order Specific Question:   Is Oral Contrast requested for this exam?    Answer:   Yes, Per Radiology protocol    Order Specific Question:   Radiology Contrast Protocol - do NOT remove file path    Answer:   \[email or web address]   CBC with Differential (Cancer Center Only)    Standing Status:   Standing    Number of Occurrences:   100    Standing Expiration Date:   11/21/2020   CMP ([place] only)    Standing Status:   Standing    Number of Occurrences:   100    Standing Expiration Date:   11/21/2020   CA 19.9    Standing Status:   Standing    Number of Occurrences:   100    Standing Expiration Date:   11/21/2020   Vitamin B12    Standing Status:   Future    Standing Expiration Date:   11/21/2020   Ferritin    Standing Status:   Future    Standing Expiration Date:   11/21/2020   Iron and TIBC    Standing Status:   Future    Standing Expiration Date:   11/21/2020   Urinalysis, Complete w Microscopic    Standing  Status:   Future    Standing Expiration Date:   11/21/2020    All questions were answered. The patient knows to call the clinic with any problems, questions or concerns. The total time spent in the appointment was 60 minutes.     Truitt Merle, MD 11/22/2019 10:59 PM  I, Joslyn Devon, am acting as scribe for Truitt Merle, MD.   I have reviewed the above documentation for accuracy and completeness, and I agree with the above.

## 2019-11-21 DIAGNOSIS — C259 Malignant neoplasm of pancreas, unspecified: Secondary | ICD-10-CM | POA: Insufficient documentation

## 2019-11-22 ENCOUNTER — Encounter: Payer: Self-pay | Admitting: Hematology

## 2019-11-22 ENCOUNTER — Inpatient Hospital Stay: Payer: Medicare Other | Attending: Hematology | Admitting: Hematology

## 2019-11-22 ENCOUNTER — Other Ambulatory Visit: Payer: Self-pay

## 2019-11-22 DIAGNOSIS — M545 Low back pain: Secondary | ICD-10-CM

## 2019-11-22 DIAGNOSIS — Z79899 Other long term (current) drug therapy: Secondary | ICD-10-CM | POA: Diagnosis not present

## 2019-11-22 DIAGNOSIS — G8929 Other chronic pain: Secondary | ICD-10-CM | POA: Insufficient documentation

## 2019-11-22 DIAGNOSIS — Z7984 Long term (current) use of oral hypoglycemic drugs: Secondary | ICD-10-CM | POA: Insufficient documentation

## 2019-11-22 DIAGNOSIS — I10 Essential (primary) hypertension: Secondary | ICD-10-CM | POA: Diagnosis not present

## 2019-11-22 DIAGNOSIS — N390 Urinary tract infection, site not specified: Secondary | ICD-10-CM | POA: Diagnosis not present

## 2019-11-22 DIAGNOSIS — M549 Dorsalgia, unspecified: Secondary | ICD-10-CM | POA: Diagnosis not present

## 2019-11-22 DIAGNOSIS — M79605 Pain in left leg: Secondary | ICD-10-CM | POA: Insufficient documentation

## 2019-11-22 DIAGNOSIS — E119 Type 2 diabetes mellitus without complications: Secondary | ICD-10-CM | POA: Insufficient documentation

## 2019-11-22 DIAGNOSIS — C259 Malignant neoplasm of pancreas, unspecified: Secondary | ICD-10-CM

## 2019-11-22 DIAGNOSIS — M79604 Pain in right leg: Secondary | ICD-10-CM | POA: Insufficient documentation

## 2019-11-22 DIAGNOSIS — Z5111 Encounter for antineoplastic chemotherapy: Secondary | ICD-10-CM | POA: Diagnosis not present

## 2019-11-22 DIAGNOSIS — C250 Malignant neoplasm of head of pancreas: Secondary | ICD-10-CM | POA: Diagnosis not present

## 2019-11-22 MED ORDER — METOPROLOL TARTRATE 50 MG PO TABS: 50.0000 mg | ORAL_TABLET | Freq: Two times a day (BID) | ORAL | 0 refills | Status: DC

## 2019-11-22 MED ORDER — VITAMIN B-12 1000 MCG PO TABS: 1000.0000 ug | ORAL_TABLET | Freq: Every day | ORAL | 1 refills | Status: AC

## 2019-11-22 MED ORDER — TRAMADOL HCL 50 MG PO TABS: 25.0000 mg | ORAL_TABLET | Freq: Four times a day (QID) | ORAL | 0 refills | Status: DC | PRN

## 2019-11-22 MED ORDER — PANTOPRAZOLE SODIUM 20 MG PO TBEC: 20.0000 mg | DELAYED_RELEASE_TABLET | Freq: Every day | ORAL | 1 refills | Status: DC

## 2019-11-22 MED ORDER — METFORMIN HCL 1000 MG PO TABS: 1000.0000 mg | ORAL_TABLET | Freq: Two times a day (BID) | ORAL | 0 refills | Status: DC

## 2019-11-22 MED ORDER — AMLODIPINE BESYLATE 5 MG PO TABS: 5.0000 mg | ORAL_TABLET | Freq: Every day | ORAL | 0 refills | Status: DC

## 2019-11-23 ENCOUNTER — Inpatient Hospital Stay: Payer: Medicare Other

## 2019-11-23 ENCOUNTER — Other Ambulatory Visit: Payer: Self-pay

## 2019-11-23 DIAGNOSIS — Z7984 Long term (current) use of oral hypoglycemic drugs: Secondary | ICD-10-CM | POA: Diagnosis not present

## 2019-11-23 DIAGNOSIS — M79605 Pain in left leg: Secondary | ICD-10-CM | POA: Diagnosis not present

## 2019-11-23 DIAGNOSIS — M545 Low back pain: Secondary | ICD-10-CM | POA: Diagnosis not present

## 2019-11-23 DIAGNOSIS — Z79899 Other long term (current) drug therapy: Secondary | ICD-10-CM | POA: Diagnosis not present

## 2019-11-23 DIAGNOSIS — Z5111 Encounter for antineoplastic chemotherapy: Secondary | ICD-10-CM | POA: Diagnosis not present

## 2019-11-23 DIAGNOSIS — I10 Essential (primary) hypertension: Secondary | ICD-10-CM | POA: Diagnosis not present

## 2019-11-23 DIAGNOSIS — M79604 Pain in right leg: Secondary | ICD-10-CM | POA: Diagnosis not present

## 2019-11-23 DIAGNOSIS — N390 Urinary tract infection, site not specified: Secondary | ICD-10-CM | POA: Diagnosis not present

## 2019-11-23 DIAGNOSIS — E119 Type 2 diabetes mellitus without complications: Secondary | ICD-10-CM | POA: Diagnosis not present

## 2019-11-23 DIAGNOSIS — G8929 Other chronic pain: Secondary | ICD-10-CM | POA: Diagnosis not present

## 2019-11-23 DIAGNOSIS — C250 Malignant neoplasm of head of pancreas: Secondary | ICD-10-CM | POA: Diagnosis not present

## 2019-11-23 DIAGNOSIS — M549 Dorsalgia, unspecified: Secondary | ICD-10-CM | POA: Diagnosis not present

## 2019-11-23 LAB — CBC WITH DIFFERENTIAL (CANCER CENTER ONLY)
Abs Immature Granulocytes: 0.02 10*3/uL (ref 0.00–0.07)
Basophils Absolute: 0 10*3/uL (ref 0.0–0.1)
Basophils Relative: 1 %
Eosinophils Absolute: 0.1 10*3/uL (ref 0.0–0.5)
Eosinophils Relative: 2 %
HCT: 38.9 % (ref 36.0–46.0)
Hemoglobin: 12.9 g/dL (ref 12.0–15.0)
Immature Granulocytes: 0 %
Lymphocytes Relative: 31 %
Lymphs Abs: 1.5 10*3/uL (ref 0.7–4.0)
MCH: 30.2 pg (ref 26.0–34.0)
MCHC: 33.2 g/dL (ref 30.0–36.0)
MCV: 91.1 fL (ref 80.0–100.0)
Monocytes Absolute: 0.4 10*3/uL (ref 0.1–1.0)
Monocytes Relative: 8 %
Neutro Abs: 2.8 10*3/uL (ref 1.7–7.7)
Neutrophils Relative %: 58 %
Platelet Count: 285 10*3/uL (ref 150–400)
RBC: 4.27 MIL/uL (ref 3.87–5.11)
RDW: 13.3 % (ref 11.5–15.5)
WBC Count: 4.8 10*3/uL (ref 4.0–10.5)
nRBC: 0 % (ref 0.0–0.2)

## 2019-11-23 LAB — CMP (CANCER CENTER ONLY)
ALT: 79 U/L — ABNORMAL HIGH (ref 0–44)
AST: 131 U/L — ABNORMAL HIGH (ref 15–41)
Albumin: 3.2 g/dL — ABNORMAL LOW (ref 3.5–5.0)
Alkaline Phosphatase: 584 U/L — ABNORMAL HIGH (ref 38–126)
Anion gap: 13 (ref 5–15)
BUN: 11 mg/dL (ref 8–23)
CO2: 25 mmol/L (ref 22–32)
Calcium: 8.7 mg/dL — ABNORMAL LOW (ref 8.9–10.3)
Chloride: 103 mmol/L (ref 98–111)
Creatinine: 0.94 mg/dL (ref 0.44–1.00)
GFR, Est AFR Am: >60 mL/min (ref >60)
GFR, Estimated: 59 mL/min — ABNORMAL LOW (ref >60)
Glucose, Bld: 202 mg/dL — ABNORMAL HIGH (ref 70–99)
Potassium: 3.6 mmol/L (ref 3.5–5.1)
Sodium: 141 mmol/L (ref 135–145)
Total Bilirubin: 0.6 mg/dL (ref 0.3–1.2)
Total Protein: 7 g/dL (ref 6.5–8.1)

## 2019-11-23 LAB — IRON AND TIBC
Iron: 101 ug/dL (ref 41–142)
Saturation Ratios: 42 % (ref 21–57)
TIBC: 240 ug/dL (ref 236–444)
UIBC: 139 ug/dL (ref 120–384)

## 2019-11-23 LAB — URINALYSIS, COMPLETE (UACMP) WITH MICROSCOPIC
Bilirubin Urine: NEGATIVE
Glucose, UA: NEGATIVE mg/dL
Ketones, ur: NEGATIVE mg/dL
Nitrite: NEGATIVE
Protein, ur: 100 mg/dL — AB
Specific Gravity, Urine: 1.017 (ref 1.005–1.030)
WBC, UA: >50 WBC/hpf — ABNORMAL HIGH (ref 0–5)
pH: 5 (ref 5.0–8.0)

## 2019-11-23 LAB — VITAMIN B12: Vitamin B-12: 905 pg/mL (ref 180–914)

## 2019-11-23 LAB — FERRITIN: Ferritin: 282 ng/mL (ref 11–307)

## 2019-11-23 NOTE — Progress Notes (Signed)
Spoke with Tamara Wood regarding CT scans, scheduled for Monday 6/14 to arrive at [facility] radiology at 3:45, NPO for 4 hours prior, drink one bottle of oral prep at 2:00 then second bottle at 3:00.  She will pick up two bottles of oral prep at front desk of [facility] tomorrow which I will leave out front.

## 2019-11-23 NOTE — Progress Notes (Signed)
Met with patient and her daughter in law Tamara Wood at her initial medical oncology appointment with Dr. Burr Medico today.  I had previously spoken with Tamara Wood, patient is spanish speaking only.  They understand my role as GI navigator and they were given my business card with my direct contact information and encouraged to call with questions or concerns as they arise.  11/23/2019  I have requested MMR testing on surgical pathology specimen from [facility] in [place], [place]

## 2019-11-24 LAB — CANCER ANTIGEN 19-9: CA 19-9: 911 U/mL — ABNORMAL HIGH (ref 0–35)

## 2019-11-25 ENCOUNTER — Telehealth: Payer: Self-pay

## 2019-11-25 LAB — URINE CULTURE: Culture: >=100000 — AB

## 2019-11-25 MED ORDER — CIPROFLOXACIN HCL 500 MG PO TABS
500.0000 mg | ORAL_TABLET | Freq: Two times a day (BID) | ORAL | 0 refills | Status: DC
Start: 2019-11-25 — End: 2019-11-30

## 2019-11-25 NOTE — Addendum Note (Signed)
Addended by: Truitt Merle on: 11/25/2019 07:18 AM   Modules accepted: Orders

## 2019-11-25 NOTE — Telephone Encounter (Signed)
-----   Message from Truitt Merle, MD sent at 11/25/2019  7:18 AM EDT ----- Santiago Glad,  Please let pt (call her daughter-in-law) know that she has E coli UTI, and I called in cipro this morning.   Also I have referred her to IR for J-tube removal, please help schedule and let pt know  Thanks   Krista Blue

## 2019-11-25 NOTE — Telephone Encounter (Signed)
I spoke with Kathreen Cosier, Ms Edmonds's daughter.  I relayed Dr. Ernestina Penna comments and recommendations.  She verbalized understanding.

## 2019-11-29 ENCOUNTER — Other Ambulatory Visit: Payer: Self-pay

## 2019-11-29 ENCOUNTER — Ambulatory Visit (HOSPITAL_COMMUNITY)
Admission: RE | Admit: 2019-11-29 | Discharge: 2019-11-29 | Disposition: A | Discharge status: Home / Self Care | Payer: Medicare Other | Source: Ambulatory Visit | Attending: Hematology | Admitting: Hematology

## 2019-11-29 DIAGNOSIS — C250 Malignant neoplasm of head of pancreas: Secondary | ICD-10-CM | POA: Diagnosis not present

## 2019-11-29 DIAGNOSIS — C259 Malignant neoplasm of pancreas, unspecified: Secondary | ICD-10-CM | POA: Diagnosis not present

## 2019-11-29 DIAGNOSIS — K760 Fatty (change of) liver, not elsewhere classified: Secondary | ICD-10-CM | POA: Diagnosis not present

## 2019-11-29 MED ORDER — IOHEXOL 300 MG/ML  SOLN
100.0000 mL | Freq: Once | INTRAMUSCULAR | Status: AC | PRN
  Administered 2019-11-29: 100 mL via INTRAVENOUS

## 2019-11-29 MED ORDER — SODIUM CHLORIDE (PF) 0.9 % IJ SOLN
INTRAMUSCULAR | Status: AC
  Filled 2019-11-29: qty 50

## 2019-11-29 NOTE — Progress Notes (Signed)
[place]   Telephone:([phone]) [phone] Fax:(336) [phone]   Clinic Follow up Note   Patient Care Team: Isaac Bliss, Rayford Halsted, MD as PCP - General (Internal Medicine) Jonnie Finner, RN as Oncology Nurse Navigator Truitt Merle, MD as Consulting Physician (Hematology)  Date of Service:  11/30/2019  CHIEF COMPLAINT: F/u of Pancreatic Cancer  SUMMARY OF ONCOLOGIC HISTORY: Oncology History Overview Note  Cancer Staging Pancreatic cancer [facility]) Staging form: Exocrine Pancreas, AJCC 8th Edition - Clinical: Stage III (cT4, cN1, cM0) - Signed by Truitt Merle, MD on 11/30/2019    Pancreatic cancer ([place])  06/04/2019 -  Hospital Admission   Admit date: 06/04/2019 Admission diagnosis: biliary obstruction  Additional comments: Patient presented with right upper quadrant abdominal pain, jaundice.  She was admitted to [facility] in [place].  She was noticed to have intrahepatic and extrahepatic biliary dilatation along with main pancreatic duct dilatation on CT 1 months prior.  MRCP showed a possible slight progression of the biliary and pancreatic duct dilatation.  Patient was evaluated by GI, underwent EGD.  Biopsy of the duodenum and increase did not review malignant cells, biopsy of pancreatic head showed chronic pancreatitis.  Patient underwent biliary drain placement by IR.  She was discharged home on June 12, 2019.   06/07/2019 Procedure   EUS with biopsy by Dr. Glade Stanford at [facility], [place].  Impression: Partial obstruction of the duodenal and the second portion in the expected area of the ampulla.  The overlying mucosa on this edematous folds which were causing the obstruction appeared normal with the exception of one area which was biopsied.    06/10/2019 Pathology Results   Common bile duct for cytology: Negative for malignant cells. Biopsy head of pancreas: chronic Pancreatitis   06/10/2019 Procedure   IR placed a biliary drain for  obstructive jaundice    07/30/2019 Imaging   CT abdomen and pelvis with contrast showed increased moderate distention of duodenal bulb without frank gastric distention.  Gallbladder absent, there is a stent in the low common bile duct without a significant dilatation.  There remains an external transhepatic biliary drain.  Increased to moderate dilatation of the pancreatic duct.  There is ill-defined fullness of the pancreatic head which is similar to prior exam.  The patient has underwent EUS aspiration and biliary stricture brushing without demonstration of tumor.  Pancreatic head the biopsy came back chronic pancreatitis.  Occult tumor remains possible.   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  Whipple procedure was planned but aborted because of him pancreatic mass was densely adherent to the mesentery vein.   11/21/2019 Initial Diagnosis   Pancreatic cancer ([place])   11/29/2019 Imaging   CT CAP W Contrast    IMPRESSION: 1. 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 2. Small lymph nodes in the retrocrural region on the LEFT largest 9 mm of uncertain significance. 3. Infiltration of the transverse mesocolon and extension of tumor into the root of the small bowel mesentery with abutment of SMV as well at the level of the first jejunal branch as described. 4. Profound hepatic steatosis. 5. Pneumobilia post stenting.  6. Signs of gastrojejunostomy and jejunostomy tube placement.    Chemotherapy   PENDING Gemcitabine 2 weeks on/1 week  off. Plan to add Abraxane to C2    11/30/2019 Cancer Staging   Staging form: Exocrine Pancreas, AJCC 8th Edition - Clinical: Stage III (cT4, cN1, cM0) - Signed by Truitt Merle, MD on 11/30/2019   12/06/2019 -  Chemotherapy   The patient had PACLitaxel-protein bound (ABRAXANE) chemo infusion 200 mg, 125 mg/m2 = 200 mg, Intravenous,  Once, 0 of 4 cycles gemcitabine (GEMZAR) 1,482 mg in sodium chloride 0.9 % 250 mL chemo infusion, 1,000 mg/m2 = 1,482 mg, Intravenous,  Once, 0 of 4 cycles  for chemotherapy treatment.       CURRENT THERAPY:  PENDING Gemcitabine 2 weeks on/1 week off. Plan to add Abraxane to C2  INTERVAL HISTORY:  Tamara Wood is here for a follow up. She presents to the clinic with her daughter in law who translates for her. She notes she feels stable. She plans to have J tube removed later today.    REVIEW OF SYSTEMS:   Constitutional: Denies fevers, chills or abnormal weight loss Eyes: Denies blurriness of vision Ears, nose, mouth, throat, and face: Denies mucositis or sore throat Respiratory: Denies cough, dyspnea or wheezes Cardiovascular: Denies palpitation, chest discomfort or lower extremity swelling Gastrointestinal:  Denies nausea, heartburn or change in bowel habits Skin: Denies abnormal skin rashes Lymphatics: Denies new lymphadenopathy or easy bruising Neurological:Denies numbness, tingling or new weaknesses Behavioral/Psych: Mood is stable, no new changes  All other systems were reviewed with the patient and are negative.  MEDICAL HISTORY:  Past Medical History:  Diagnosis Date  . Diabetes mellitus without complication ([place])   . Hypertension   . Pancreatic cancer [facility])     SURGICAL HISTORY: Past Surgical History:  Procedure Laterality Date  . APPENDECTOMY    . BACK SURGERY    . CHOLECYSTECTOMY      I have reviewed the social history and family history with the patient and they are unchanged from previous note.  ALLERGIES:  has No Known  Allergies.  MEDICATIONS:  Current Outpatient Medications  Medication Sig Dispense Refill  . amLODipine (NORVASC) 5 MG tablet Take 1 tablet (5 mg total) by mouth daily. 30 tablet 0  . ASPIRIN LOW DOSE 81 MG EC tablet Take 81 mg by mouth daily.    . Calcium Carb-Cholecalciferol (CALCIUM/VITAMIN D) 500-200 MG-UNIT TABS SMARTSIG:1 Tablet(s) By Mouth Twice Daily    . ibuprofen (ADVIL) 600 MG tablet Take 600 mg by mouth every 6 (six) hours as needed.    . Magnesium 250 MG TABS Take 250 mg by mouth daily.    . metFORMIN (GLUCOPHAGE) 1000 MG tablet Take 1 tablet (1,000 mg total) by mouth 2 (two) times daily. 30 tablet 0  . metoCLOPramide (REGLAN) 10 MG tablet     . metoprolol tartrate (LOPRESSOR) 50 MG tablet Take 1 tablet (50 mg total) by mouth 2 (two) times daily. 30 tablet 0  . ondansetron (ZOFRAN) 8 MG tablet Take 1 tablet (8 mg total) by mouth 2 (two) times daily as needed (Nausea or vomiting). 30 tablet 1  . pantoprazole (PROTONIX) 20 MG tablet Take 1 tablet (20 mg total) by mouth daily. 90 tablet 1  . prochlorperazine (COMPAZINE) 10 MG tablet Take 1 tablet (10 mg total) by mouth every 6 (six) hours as needed (Nausea or vomiting). 30 tablet 1  . traMADol (ULTRAM) 50 MG tablet Take 0.5-1 tablets (25-50 mg total) by mouth  every 6 (six) hours as needed. 30 tablet 0  . vitamin B-12 (CYANOCOBALAMIN) 1000 MCG tablet Take 1 tablet (1,000 mcg total) by mouth daily. 90 tablet 1   No current facility-administered medications for this visit.    PHYSICAL EXAMINATION: ECOG PERFORMANCE STATUS: 2 - Symptomatic, <50% confined to bed  Vitals:   11/30/19 0918  BP: (!) 163/81  Pulse: 65  Resp: 16  Temp: 98.1 F (36.7 C)  SpO2: 96%   Filed Weights   11/30/19 0918  Weight: 130 lb 4.8 oz (59.1 kg)    Due to COVID19 we will limit examination to appearance. Patient had no complaints.  GENERAL:alert, no distress and comfortable SKIN: skin color normal, no rashes or significant lesions EYES: normal,  Conjunctiva are pink and non-injected, sclera clear  NEURO: alert & oriented x 3 with fluent speech   LABORATORY DATA:  I have reviewed the data as listed CBC Latest Ref Rng & Units 11/23/2019  WBC 4.0 - 10.5 K/uL 4.8  Hemoglobin 12.0 - 15.0 g/dL 12.9  Hematocrit 36 - 46 % 38.9  Platelets 150 - 400 K/uL 285     CMP Latest Ref Rng & Units 11/23/2019  Glucose 70 - 99 mg/dL 202(H)  BUN 8 - 23 mg/dL 11  Creatinine 0.44 - 1.00 mg/dL 0.94  Sodium 135 - 145 mmol/L 141  Potassium 3.5 - 5.1 mmol/L 3.6  Chloride 98 - 111 mmol/L 103  CO2 22 - 32 mmol/L 25  Calcium 8.9 - 10.3 mg/dL 8.7(L)  Total Protein 6.5 - 8.1 g/dL 7.0  Total Bilirubin 0.3 - 1.2 mg/dL 0.6  Alkaline Phos 38 - 126 U/L 584(H)  AST 15 - 41 U/L 131(H)  ALT 0 - 44 U/L 79(H)      RADIOGRAPHIC STUDIES: I have personally reviewed the radiological images as listed and agreed with the findings in the report. CT Chest W Contrast  Result Date: 11/29/2019 CLINICAL DATA:  History of pancreatic cancer, post aborted Whipple procedure due to venous involvement. EXAM: CT CHEST, ABDOMEN, AND PELVIS WITH CONTRAST TECHNIQUE: Multidetector CT imaging of the chest, abdomen and pelvis was performed following the standard protocol during bolus administration of intravenous contrast. CONTRAST:  166mL OMNIPAQUE IOHEXOL 300 MG/ML  SOLN COMPARISON:  None FINDINGS: CT CHEST FINDINGS Cardiovascular: Calcified atherosclerosis scattered about the thoracic aorta. No aneurysmal dilation. Heart size is normal. Central pulmonary vasculature shows normal appearance on venous phase assessment. Mediastinum/Nodes: Thoracic inlet structures are normal. No axillary lymphadenopathy. No mediastinal lymphadenopathy. Lungs/Pleura: Basilar atelectasis. No consolidation. No pleural effusion. Elevation of the RIGHT hemidiaphragm. Airways are patent. Musculoskeletal: No chest wall mass, see below for full musculoskeletal details. CT ABDOMEN PELVIS FINDINGS Hepatobiliary: Marked  hepatic steatosis. Post cholecystectomy. Pneumobilia following biliary stent placement. Accounting for the degree of steatotic change no focal suspicious hepatic lesion. Pancreas: Ill-defined mass in the pancreatic head abuts the portal vein, some venous distortion at the splenic portal confluence, also with soft tissue and hazy stranding contact beyond 180 degrees of the SMV on image 60 of series 2. 180 degrees or slightly greater involvement of the SMA also on the same image. Masslike area in the head of the pancreas is associated with pancreatic ductal dilation and peripheral atrophy. Area measuring 3.3 x 2.5 cm and associated with obstruction of the duodenum post gastrojejunostomy. Spleen: Normal size without focal lesion. Adrenals/Urinary Tract: Adrenal glands are normal. Mild renal cortical scarring. No hydronephrosis. Urinary bladder is normal. Stomach/Bowel: Post gastrojejunostomy. Obstruction of the duodenum at the level of  the pancreatic mass. Post age in ostomy tube placement. No additional sign of bowel obstruction. No sign of acute bowel process. Colonic diverticulosis. Extension of stranding into the transverse mesocolon. Vascular/Lymphatic: Vascular involvement as discussed. Calcific atheromatous plaque of the abdominal aorta. No aneurysmal dilation. Small lymph node inferior to the duodenum anterior to the inferior vena cava approximately 6 mm short axis. There is a small LEFT retrocrural lymph node measuring approximately 9 mm (image 46, series 2) No pelvic adenopathy. Reproductive: Uterus and adnexa grossly unremarkable by CT. Other: No ascites. No peritoneal nodularity. Postoperative changes in the midline from reported laparotomy presumably. No abdominal wall hernia. Wound manager over jejunostomy tube. Musculoskeletal: Spinal degenerative changes. Laminectomy changes at L1 and L2. No acute bone finding. No destructive bone process. IMPRESSION: 1. Pancreatic mass compatible with known pancreatic  adenocarcinoma in the head of the pancreas obstructing pancreatic duct, abutting splenic portal confluence, distorting portal vein and abutting the SMA, also associated with duodenal obstruction. 2. Small lymph nodes in the retrocrural region on the LEFT largest 9 mm of uncertain significance. 3. Infiltration of the transverse mesocolon and extension of tumor into the root of the small bowel mesentery with abutment of SMV as well at the level of the first jejunal branch as described. 4. Profound hepatic steatosis. 5. Pneumobilia post stenting. 6. Signs of gastrojejunostomy and jejunostomy tube placement. Electronically Signed   By: Zetta Bills M.D.   On: 11/29/2019 17:55   CT Abdomen Pelvis W Contrast  Result Date: 11/29/2019 CLINICAL DATA:  History of pancreatic cancer, post aborted Whipple procedure due to venous involvement. EXAM: CT CHEST, ABDOMEN, AND PELVIS WITH CONTRAST TECHNIQUE: Multidetector CT imaging of the chest, abdomen and pelvis was performed following the standard protocol during bolus administration of intravenous contrast. CONTRAST:  184mL OMNIPAQUE IOHEXOL 300 MG/ML  SOLN COMPARISON:  None FINDINGS: CT CHEST FINDINGS Cardiovascular: Calcified atherosclerosis scattered about the thoracic aorta. No aneurysmal dilation. Heart size is normal. Central pulmonary vasculature shows normal appearance on venous phase assessment. Mediastinum/Nodes: Thoracic inlet structures are normal. No axillary lymphadenopathy. No mediastinal lymphadenopathy. Lungs/Pleura: Basilar atelectasis. No consolidation. No pleural effusion. Elevation of the RIGHT hemidiaphragm. Airways are patent. Musculoskeletal: No chest wall mass, see below for full musculoskeletal details. CT ABDOMEN PELVIS FINDINGS Hepatobiliary: Marked hepatic steatosis. Post cholecystectomy. Pneumobilia following biliary stent placement. Accounting for the degree of steatotic change no focal suspicious hepatic lesion. Pancreas: Ill-defined mass in  the pancreatic head abuts the portal vein, some venous distortion at the splenic portal confluence, also with soft tissue and hazy stranding contact beyond 180 degrees of the SMV on image 60 of series 2. 180 degrees or slightly greater involvement of the SMA also on the same image. Masslike area in the head of the pancreas is associated with pancreatic ductal dilation and peripheral atrophy. Area measuring 3.3 x 2.5 cm and associated with obstruction of the duodenum post gastrojejunostomy. Spleen: Normal size without focal lesion. Adrenals/Urinary Tract: Adrenal glands are normal. Mild renal cortical scarring. No hydronephrosis. Urinary bladder is normal. Stomach/Bowel: Post gastrojejunostomy. Obstruction of the duodenum at the level of the pancreatic mass. Post age in ostomy tube placement. No additional sign of bowel obstruction. No sign of acute bowel process. Colonic diverticulosis. Extension of stranding into the transverse mesocolon. Vascular/Lymphatic: Vascular involvement as discussed. Calcific atheromatous plaque of the abdominal aorta. No aneurysmal dilation. Small lymph node inferior to the duodenum anterior to the inferior vena cava approximately 6 mm short axis. There is a small LEFT retrocrural lymph  node measuring approximately 9 mm (image 46, series 2) No pelvic adenopathy. Reproductive: Uterus and adnexa grossly unremarkable by CT. Other: No ascites. No peritoneal nodularity. Postoperative changes in the midline from reported laparotomy presumably. No abdominal wall hernia. Wound manager over jejunostomy tube. Musculoskeletal: Spinal degenerative changes. Laminectomy changes at L1 and L2. No acute bone finding. No destructive bone process. IMPRESSION: 1. 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 2.  Small lymph nodes in the retrocrural region on the LEFT largest 9 mm of uncertain significance. 3. Infiltration of the transverse mesocolon and extension of tumor into the root of the small bowel mesentery with abutment of SMV as well at the level of the first jejunal branch as described. 4. Profound hepatic steatosis. 5. Pneumobilia post stenting. 6. Signs of gastrojejunostomy and jejunostomy tube placement. Electronically Signed   By: Zetta Bills M.D.   On: 11/29/2019 17:55     ASSESSMENT & PLAN:  Kynzee Devinney is a 74 y.o. female with    1. Pancreatic Cancer, stage III, unresectable  -I reviewed her outside medical records (more than a hundred pages) including imaging and pathology from [place]. She initially presented to hospital in [place] with RUQ abdominal and jaundice. Her work up showed intrahepatic and extrahepatic biliary dilatation. EGD with biopsy of duodenal mucosa and head of pancreas showed no cancer cells at that time. She underwent biliary drain placement by IR in 05/2020 which was removed in 09/2019.  -She underwent biopsy of pancreatic head mass on 08/10/19 which showed invasive well differentiated adenocarcinoma, peripancreatic LN biopsy was also positive.  -She underwent Whipple Surgery on 08/29/19 but was aborted due to tumor invading the SMV. She instead had gastrojejunostomy and J-tube placement due to bowel blockages.  -I discussed her tumor has invaded her mesentery and vascular structures around her pancreas, making her tumor unresectable. It is no longer curable in this case but still treatable to control her disease and prolong her life.  -We discussed her CT CAP from 11/29/19 which shows no evidence of distant metastasis, but again demonstrated locally advanced disease unresectable. Her baseline Ca 19.9 was elevated at 911 which is concerning for high tumor burden and developing distant metastasis.  -Given her age, I discussed treatment with systemic chemo with Gemcitabine  with or without Abraxane 2 weeks on/1 week off for 3-6 months followed by target Radiation to allow for chemo break. Plan to give gemcitabine alone for first cycle,  If tolerable on single agent I will add Abraxane to C2. I discussed with more chemo drugs there will be more side effects although it will be more effective, so we will also prioritize her quality of life. She voiced good understanding and agrees with the plan.    --Chemotherapy consent: Side effects including but does not limited to, fatigue, nausea, vomiting, diarrhea, hair loss, neuropathy, fluid retention, renal and kidney dysfunction, neutropenic fever, needed for blood transfusion, bleeding, were discussed with patient in great detail. She agrees to proceed. -The goal of therapy is palliative to prolong life and preserve her quality of life -She will proceed with chemo education class before starting chemo next week.  -She opted to proceed with PAC placement.  -I discussed there is small amount of pancreatic cancers that are caused by genetic mutations. I encouraged Genetic testing as this may lead to targetable treatment options. She is  agreeable. -I will f/u with her on C1D8.    2. Nutrition  -During her 08/29/19 surgery her whipple was aborted and instead had bypass of duodenum and CBD due to blockages. She also had J-tube placed at that time. She did use this after surgery but has not used since she was discharged from rehab. She will have J tube removed later today (11/30/19) -She feels she eats adequately by mouth now.   3. Comorbidities: HTN, DM, H/o of spinal tumor, Chronic back pain  -On Medication  -She plans to see new PCP on 11/30/19. I refilled some of her medications today.  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 When she moved to [place] in 09/1989 she had back surgery and had it removed.  -She has persistent mid lower back pain which is constant and dull. To aid in  her pain I started her on Low dose Tramadol (11/22/19). She can start with half tablets.  -She was placed on Lovenox injections post surgery and while she was in rehab. She stopped in 11/2019. I encouraged her to remain active.   4. Dysuria  -Onset 2 weeks ago. Her UA and culture indicated UTI. I previously called in Cipro (11/25/19). Her symptoms have improved. I recommend she also take Cranberry juice.    5. Social Support  -She is originally from [place] She moved to [place] in 09/1989. She is married with 4 children.  -After medical issues since late 05/2019 her son and his wife who are fluent in [place] moved to [place]. After cancer diagnosis, all of them moved to [place], [place] in early May given 3 of her children live in [place]. She has great family support  -She is a Jehov[employer] Witness   6. B/l leg pain  -She has been having b/l leg pain, but mainly of left leg.  -Given pancreatic cancer increases risk for blood clots, I will order LE doppler to be done. She is agreeable.   7. Goal of care discussion  -We again discussed the incurable nature of her cancer, and the overall poor prognosis, especially if she does not have good response to chemotherapy or progress on chemo -The patient understands the goal of care is palliative. -she is full code for now    PLAN:  -I will call in Compazine and Zofran today  -Proceed with J-tube removal today  -LE Doppler today or tomorrow  -Send genetic referral  -IR PAC placement next week by IR -Chemo education class next week -Lab, flush and Gemcitabine in 1-2 weeks.  -F/u on C1D8    No problem-specific Assessment & Plan notes found for this encounter.   Orders Placed This Encounter  Procedures  . IR IMAGING GUIDED PORT INSERTION    Standing Status:   Future    Standing Expiration Date:   11/29/2020    Order Specific Question:   Reason for Exam (SYMPTOM  OR DIAGNOSIS REQUIRED)    Answer:   chemo    Order Specific Question:    Preferred Imaging Location?    Answer:   [facility]    Order Specific Question:   Release to patient    Answer:   Immediate  . Ambulatory referral to Genetics    Referral Priority:   Routine    Referral Type:   Consultation    Referral Reason:   Specialty Services Required    Number of Visits Requested:   1   All questions were  answered. The patient knows to call the clinic with any problems, questions or concerns. No barriers to learning was detected. The total time spent in the appointment was 40 minutes.     Truitt Merle, MD 11/30/2019   I, Joslyn Devon, am acting as scribe for Truitt Merle, MD.   I have reviewed the above documentation for accuracy and completeness, and I agree with the above.

## 2019-11-30 ENCOUNTER — Other Ambulatory Visit: Payer: Self-pay

## 2019-11-30 ENCOUNTER — Encounter: Payer: Self-pay | Admitting: Hematology

## 2019-11-30 ENCOUNTER — Inpatient Hospital Stay (HOSPITAL_BASED_OUTPATIENT_CLINIC_OR_DEPARTMENT_OTHER): Payer: Medicare Other | Admitting: Hematology

## 2019-11-30 ENCOUNTER — Encounter: Payer: Self-pay | Admitting: Internal Medicine

## 2019-11-30 ENCOUNTER — Ambulatory Visit (HOSPITAL_BASED_OUTPATIENT_CLINIC_OR_DEPARTMENT_OTHER)
Admission: RE | Admit: 2019-11-30 | Discharge: 2019-11-30 | Disposition: A | Discharge status: Home / Self Care | Payer: Medicare Other | Source: Ambulatory Visit | Attending: Hematology | Admitting: Hematology

## 2019-11-30 ENCOUNTER — Ambulatory Visit (INDEPENDENT_AMBULATORY_CARE_PROVIDER_SITE_OTHER): Payer: Medicare Other | Admitting: Internal Medicine

## 2019-11-30 ENCOUNTER — Ambulatory Visit (HOSPITAL_COMMUNITY)
Admission: RE | Admit: 2019-11-30 | Discharge: 2019-11-30 | Disposition: A | Discharge status: Home / Self Care | Payer: Medicare Other | Source: Ambulatory Visit | Attending: Hematology | Admitting: Hematology

## 2019-11-30 VITALS — BP 163/81 | HR 65 | Temp 98.1°F | Resp 16 | Wt 130.3 lb

## 2019-11-30 VITALS — BP 110/70 | HR 61 | Temp 97.7°F | Ht 66.0 in | Wt 130.4 lb

## 2019-11-30 DIAGNOSIS — Z431 Encounter for attention to gastrostomy: Secondary | ICD-10-CM | POA: Insufficient documentation

## 2019-11-30 DIAGNOSIS — M79605 Pain in left leg: Secondary | ICD-10-CM | POA: Diagnosis not present

## 2019-11-30 DIAGNOSIS — E119 Type 2 diabetes mellitus without complications: Secondary | ICD-10-CM | POA: Diagnosis not present

## 2019-11-30 DIAGNOSIS — M79662 Pain in left lower leg: Secondary | ICD-10-CM

## 2019-11-30 DIAGNOSIS — Z931 Gastrostomy status: Secondary | ICD-10-CM | POA: Diagnosis not present

## 2019-11-30 DIAGNOSIS — C250 Malignant neoplasm of head of pancreas: Secondary | ICD-10-CM

## 2019-11-30 DIAGNOSIS — N390 Urinary tract infection, site not specified: Secondary | ICD-10-CM | POA: Diagnosis not present

## 2019-11-30 DIAGNOSIS — Z79899 Other long term (current) drug therapy: Secondary | ICD-10-CM | POA: Diagnosis not present

## 2019-11-30 DIAGNOSIS — M79661 Pain in right lower leg: Secondary | ICD-10-CM

## 2019-11-30 DIAGNOSIS — I10 Essential (primary) hypertension: Secondary | ICD-10-CM

## 2019-11-30 DIAGNOSIS — Z5111 Encounter for antineoplastic chemotherapy: Secondary | ICD-10-CM | POA: Diagnosis not present

## 2019-11-30 DIAGNOSIS — Z7984 Long term (current) use of oral hypoglycemic drugs: Secondary | ICD-10-CM | POA: Diagnosis not present

## 2019-11-30 DIAGNOSIS — M549 Dorsalgia, unspecified: Secondary | ICD-10-CM | POA: Diagnosis not present

## 2019-11-30 DIAGNOSIS — M79604 Pain in right leg: Secondary | ICD-10-CM | POA: Diagnosis not present

## 2019-11-30 DIAGNOSIS — Z7189 Other specified counseling: Secondary | ICD-10-CM

## 2019-11-30 DIAGNOSIS — M545 Low back pain: Secondary | ICD-10-CM | POA: Diagnosis not present

## 2019-11-30 DIAGNOSIS — G8929 Other chronic pain: Secondary | ICD-10-CM | POA: Diagnosis not present

## 2019-11-30 HISTORY — PX: IR GASTROSTOMY TUBE REMOVAL: IMG5492

## 2019-11-30 LAB — POCT GLYCOSYLATED HEMOGLOBIN (HGB A1C): Hemoglobin A1C: 7.9 % — AB (ref 4.0–5.6)

## 2019-11-30 MED ORDER — ONDANSETRON HCL 8 MG PO TABS: 8.0000 mg | ORAL_TABLET | Freq: Two times a day (BID) | ORAL | 1 refills | Status: DC | PRN

## 2019-11-30 MED ORDER — LIDOCAINE VISCOUS HCL 2 % MT SOLN
OROMUCOSAL | Status: AC
  Filled 2019-11-30: qty 15

## 2019-11-30 MED ORDER — PROCHLORPERAZINE MALEATE 10 MG PO TABS: 10.0000 mg | ORAL_TABLET | Freq: Four times a day (QID) | ORAL | 1 refills | Status: DC | PRN

## 2019-11-30 NOTE — Progress Notes (Signed)
START ON PATHWAY REGIMEN - Pancreatic Adenocarcinoma     A cycle is every 28 days:     Nab-paclitaxel (protein bound)      Gemcitabine   **Always confirm dose/schedule in your pharmacy ordering system**  Patient Characteristics: Locally Advanced, Anatomically Unresectable, First Line, PS = 0,1, BRCA1/2 and PALB2 Mutation Absent/Unknown, Chemotherapy Therapeutic Status: Locally Advanced, Anatomically Unresectable Line of Therapy: First Line ECOG Performance Status: 1 BRCA1/2 Mutation Status: Awaiting Test Results PALB2 Mutation Status: Awaiting Test Results Intent of Therapy: Non-Curative / Palliative Intent, Discussed with Patient 

## 2019-11-30 NOTE — Progress Notes (Signed)
Established Patient Office Visit     This visit occurred during the SARS-CoV-2 public health emergency.  Safety protocols were in place, including screening questions prior to the visit, additional usage of staff PPE, and extensive cleaning of exam room while observing appropriate contact time as indicated for disinfecting solutions.    CC/Reason for Visit: Establish care, discuss chronic conditions  HPI: Tamara Wood is a 74 y.o. female who is coming in today for the above mentioned reasons. Past Medical History is significant for: Pancreatic cancer that was diagnosed in December 2020.  Slated to start chemotherapy next week.  She also has a history of well-controlled hypertension, GERD and previously well-controlled type 2 diabetes.  Her medications include Metformin 1000 mg twice daily, amlodipine 5 mg daily, metoprolol 50 mg twice daily, Protonix 20 mg daily and aspirin 80 g daily.  She has lost over 35 pounds since her cancer diagnosis.  She admits to her diet being very carbohydrate heavy as these are usually the only things that she can tolerate.  She has received both of her Covid vaccines.  She has no known drug allergies, she is a never smoker, does not drink alcohol, mother and multiple siblings have had diabetes.  [place] but has been living in [place] with a son.  She has moved to [facility] to be closer to family that could help her navigate her cancer diagnosis and her current mutations.   Past Medical/Surgical History: Past Medical History:  Diagnosis Date  . Diabetes mellitus without complication ([place])   . Hypertension   . Pancreatic cancer [facility])     Past Surgical History:  Procedure Laterality Date  . APPENDECTOMY    . BACK SURGERY    . CHOLECYSTECTOMY      Social History:  reports that she has never smoked. She has never used smokeless tobacco. She reports that she does not drink alcohol and does not use  drugs.  Allergies: No Known Allergies  Family History:  Family History  Problem Relation Age of Onset  . Diabetes Mother   . Diabetes Sister   . Diabetes Brother      Current Outpatient Medications:  .  amLODipine (NORVASC) 5 MG tablet, Take 1 tablet (5 mg total) by mouth daily., Disp: 30 tablet, Rfl: 0 .  ASPIRIN LOW DOSE 81 MG EC tablet, Take 81 mg by mouth daily., Disp: , Rfl:  .  Calcium Carb-Cholecalciferol (CALCIUM/VITAMIN D) 500-200 MG-UNIT TABS, SMARTSIG:1 Tablet(s) By Mouth Twice Daily, Disp: , Rfl:  .  ibuprofen (ADVIL) 600 MG tablet, Take 600 mg by mouth every 6 (six) hours as needed., Disp: , Rfl:  .  Magnesium 250 MG TABS, Take 250 mg by mouth daily., Disp: , Rfl:  .  metFORMIN (GLUCOPHAGE) 1000 MG tablet, Take 1 tablet (1,000 mg total) by mouth 2 (two) times daily., Disp: 30 tablet, Rfl: 0 .  metoCLOPramide (REGLAN) 10 MG tablet, , Disp: , Rfl:  .  metoprolol tartrate (LOPRESSOR) 50 MG tablet, Take 1 tablet (50 mg total) by mouth 2 (two) times daily., Disp: 30 tablet, Rfl: 0 .  ondansetron (ZOFRAN) 8 MG tablet, Take 1 tablet (8 mg total) by mouth 2 (two) times daily as needed (Nausea or vomiting)., Disp: 30 tablet, Rfl: 1 .  pantoprazole (PROTONIX) 20 MG tablet, Take 1 tablet (20 mg total) by mouth daily., Disp: 90 tablet, Rfl: 1 .  prochlorperazine (COMPAZINE) 10 MG tablet, Take 1 tablet (10 mg total)  by mouth every 6 (six) hours as needed (Nausea or vomiting)., Disp: 30 tablet, Rfl: 1 .  traMADol (ULTRAM) 50 MG tablet, Take 0.5-1 tablets (25-50 mg total) by mouth every 6 (six) hours as needed., Disp: 30 tablet, Rfl: 0 .  vitamin B-12 (CYANOCOBALAMIN) 1000 MCG tablet, Take 1 tablet (1,000 mcg total) by mouth daily., Disp: 90 tablet, Rfl: 1  Review of Systems:  Constitutional: Denies fever, chills, diaphoresis, appetite change and fatigue.  HEENT: Denies photophobia, eye pain, redness, hearing loss, ear pain, congestion, sore throat, rhinorrhea, sneezing, mouth sores,  trouble swallowing, neck pain, neck stiffness and tinnitus.   Respiratory: Denies SOB, DOE, cough, chest tightness,  and wheezing.   Cardiovascular: Denies chest pain, palpitations and leg swelling.  Gastrointestinal: Denies nausea, vomiting, diarrhea, constipation, blood in stool and abdominal distention.  Genitourinary: Denies dysuria, urgency, frequency, hematuria, flank pain and difficulty urinating.  Endocrine: Denies: hot or cold intolerance, sweats, changes in hair or nails, polyuria, polydipsia. Musculoskeletal: Denies myalgias, back pain, joint swelling, arthralgias and gait problem.  Skin: Denies pallor, rash and wound.  Neurological: Denies dizziness, seizures, syncope, weakness, light-headedness, numbness and headaches.  Hematological: Denies adenopathy. Easy bruising, personal or family bleeding history  Psychiatric/Behavioral: Denies suicidal ideation, mood changes, confusion, nervousness, sleep disturbance and agitation    Physical Exam: Vitals:   11/30/19 1103  BP: 110/70  Pulse: 61  Temp: 97.7 F (36.5 C)  TempSrc: Temporal  SpO2: 95%  Weight: 130 lb 6.4 oz (59.1 kg)  Height: 5\' 6"  (1.676 m)    Body mass index is 21.05 kg/m.   Constitutional: NAD, calm, comfortable Eyes: PERRL, lids and conjunctivae normal ENMT: Mucous membranes are moist.  Respiratory: clear to auscultation bilaterally, no wheezing, no crackles. Normal respiratory effort. No accessory muscle use.  Cardiovascular: Regular rate and rhythm, no murmurs / rubs / gallops. No extremity edema. .  Neurologic: Grossly intact and nonfocal Psychiatric: Normal judgment and insight. Alert and oriented x 3. Normal mood.    Impression and Plan:  Malignant neoplasm of head of pancreas ([place]) -Noted, followed by oncology  Essential hypertension -Well-controlled on current regimen.  Type 2 diabetes mellitus without complication, without long-term current use of insulin ([facility])  -A1c in office today is  above goal at 7.9. -Given her current cancer diagnosis, do not believe we need to do anything further for now but will follow with A1c's every 3 to 4 months.   Patient Instructions  -Nice seeing you today!!  -Schedule follow up in 4 months.     Lelon Frohlich, MD [facility] at [facility]

## 2019-11-30 NOTE — Patient Instructions (Signed)
-  Nice seeing you today!!  -Schedule follow up in 4 months. 

## 2019-11-30 NOTE — Progress Notes (Signed)
Tamara Wood is scheduled to have bl venous u/s of le at 1400.  Reviewed with Tamara Wood and her daughter.  She is to arrive at admissions dept at [facility] at 1345.  They verbalized understanding.

## 2019-11-30 NOTE — Procedures (Signed)
Per order of Dr. Burr Medico, patient's jejunostomy tube was removed in its entirety without immediate complications.  Gauze dressing applied over site.  Site care instructions reviewed with patient.EBL none.

## 2019-11-30 NOTE — Progress Notes (Signed)
Venous duplex       has been completed. Preliminary results can be found under CV proc through chart review. Zackerie Sara, BS, RDMS, RVT   

## 2019-12-02 ENCOUNTER — Telehealth: Payer: Self-pay | Admitting: Hematology

## 2019-12-02 NOTE — Telephone Encounter (Signed)
Scheduled appt per 6/15 los.  Spoke with pt household member and they are aware of the appt date and time.

## 2019-12-03 LAB — SURGICAL PATHOLOGY

## 2019-12-03 NOTE — Progress Notes (Signed)
Pharmacist Chemotherapy Monitoring - Initial Assessment    Anticipated start date: 12/09/2019   Regimen:  . Are orders appropriate based on the patient's diagnosis, regimen, and cycle? Yes . Does the plan date match the patient's scheduled date? Yes . Is the sequencing of drugs appropriate? Yes . Are the premedications appropriate for the patient's regimen? Yes . Prior Authorization for treatment is: Not Started o If applicable, is the correct biosimilar selected based on the patient's insurance? not applicable  Organ Function and Labs: Marland Kitchen Are dose adjustments needed based on the patient's renal function, hepatic function, or hematologic function? No . Are appropriate labs ordered prior to the start of patient's treatment? Yes . Other organ system assessment, if indicated: N/A . The following baseline labs, if indicated, have been ordered: N/A  Dose Assessment: . Are the drug doses appropriate? Yes . Are the following correct: o Drug concentrations Yes o IV fluid compatible with drug Yes o Administration routes Yes o Timing of therapy Yes . If applicable, does the patient have documented access for treatment and/or plans for port-a-cath placement? yes . If applicable, have lifetime cumulative doses been properly documented and assessed? not applicable Lifetime Dose Tracking  No doses have been documented on this patient for the following tracked chemicals: Doxorubicin, Epirubicin, Idarubicin, Daunorubicin, Mitoxantrone, Bleomycin, Oxaliplatin, Carboplatin, Liposomal Doxorubicin  o   Toxicity Monitoring/Prevention: . The patient has the following take home antiemetics prescribed: Ondansetron and Prochlorperazine . The patient has the following take home medications prescribed: N/A . Medication allergies and previous infusion related reactions, if applicable, have been reviewed and addressed. Yes . The patient's current medication list has been assessed for drug-drug interactions with  their chemotherapy regimen. no significant drug-drug interactions were identified on review.  Order Review: . Are the treatment plan orders signed? Yes . Is the patient scheduled to see a provider prior to their treatment? No  I verify that I have reviewed each item in the above checklist and answered each question accordingly.  Tamara Wood 12/03/2019 8:08 AM

## 2019-12-05 ENCOUNTER — Other Ambulatory Visit: Payer: Self-pay | Admitting: Hematology

## 2019-12-06 ENCOUNTER — Telehealth: Payer: Self-pay | Admitting: Internal Medicine

## 2019-12-06 NOTE — Telephone Encounter (Signed)
Pt is calling in stating that she is out of the magnesium 250 MG and metoprolol tartrate 50 MG   Pharm:  [facility] on [address]

## 2019-12-07 ENCOUNTER — Encounter: Payer: Self-pay | Admitting: Hematology

## 2019-12-07 ENCOUNTER — Other Ambulatory Visit: Payer: Self-pay | Admitting: Radiology

## 2019-12-07 ENCOUNTER — Other Ambulatory Visit: Payer: Self-pay

## 2019-12-07 ENCOUNTER — Inpatient Hospital Stay: Payer: Medicare Other

## 2019-12-07 MED ORDER — MAGNESIUM 250 MG PO TABS: 250.0000 mg | ORAL_TABLET | Freq: Every day | ORAL | 1 refills | Status: DC

## 2019-12-07 MED ORDER — METOPROLOL TARTRATE 50 MG PO TABS: 50.0000 mg | ORAL_TABLET | Freq: Two times a day (BID) | ORAL | 1 refills | Status: DC

## 2019-12-07 NOTE — Progress Notes (Signed)
Met with patient at registration via interpreter to introduce myself as Arboriculturist and to offer available resources.  Discussed one-time $1000 Radio broadcast assistant to assist with personal expenses while going through treatment.  Gave my card if interested in applying and for any additional financial questions or concerns.

## 2019-12-08 ENCOUNTER — Ambulatory Visit (HOSPITAL_COMMUNITY)
Admission: RE | Admit: 2019-12-08 | Discharge: 2019-12-08 | Disposition: A | Discharge status: Home / Self Care | Payer: Medicare Other | Source: Ambulatory Visit | Attending: Hematology | Admitting: Hematology

## 2019-12-08 ENCOUNTER — Other Ambulatory Visit: Payer: Self-pay

## 2019-12-08 ENCOUNTER — Encounter (HOSPITAL_COMMUNITY): Payer: Self-pay

## 2019-12-08 DIAGNOSIS — Z7982 Long term (current) use of aspirin: Secondary | ICD-10-CM | POA: Insufficient documentation

## 2019-12-08 DIAGNOSIS — Z9049 Acquired absence of other specified parts of digestive tract: Secondary | ICD-10-CM | POA: Diagnosis not present

## 2019-12-08 DIAGNOSIS — E119 Type 2 diabetes mellitus without complications: Secondary | ICD-10-CM | POA: Diagnosis not present

## 2019-12-08 DIAGNOSIS — I10 Essential (primary) hypertension: Secondary | ICD-10-CM | POA: Insufficient documentation

## 2019-12-08 DIAGNOSIS — C250 Malignant neoplasm of head of pancreas: Secondary | ICD-10-CM | POA: Diagnosis not present

## 2019-12-08 DIAGNOSIS — Z79899 Other long term (current) drug therapy: Secondary | ICD-10-CM | POA: Diagnosis not present

## 2019-12-08 DIAGNOSIS — Z452 Encounter for adjustment and management of vascular access device: Secondary | ICD-10-CM | POA: Diagnosis not present

## 2019-12-08 DIAGNOSIS — Z7984 Long term (current) use of oral hypoglycemic drugs: Secondary | ICD-10-CM | POA: Insufficient documentation

## 2019-12-08 DIAGNOSIS — Z833 Family history of diabetes mellitus: Secondary | ICD-10-CM | POA: Diagnosis not present

## 2019-12-08 HISTORY — PX: IR IMAGING GUIDED PORT INSERTION: IMG5740

## 2019-12-08 LAB — CBC
HCT: 35.2 % — ABNORMAL LOW (ref 36.0–46.0)
Hemoglobin: 11.3 g/dL — ABNORMAL LOW (ref 12.0–15.0)
MCH: 29.4 pg (ref 26.0–34.0)
MCHC: 32.1 g/dL (ref 30.0–36.0)
MCV: 91.7 fL (ref 80.0–100.0)
Platelets: 283 10*3/uL (ref 150–400)
RBC: 3.84 MIL/uL — ABNORMAL LOW (ref 3.87–5.11)
RDW: 13.7 % (ref 11.5–15.5)
WBC: 5 10*3/uL (ref 4.0–10.5)
nRBC: 0 % (ref 0.0–0.2)

## 2019-12-08 LAB — GLUCOSE, CAPILLARY: Glucose-Capillary: 159 mg/dL — ABNORMAL HIGH (ref 70–99)

## 2019-12-08 MED ORDER — FENTANYL CITRATE (PF) 100 MCG/2ML IJ SOLN
INTRAMUSCULAR | Status: AC | PRN
  Administered 2019-12-08 (×2): 50 ug via INTRAVENOUS

## 2019-12-08 MED ORDER — HEPARIN SOD (PORK) LOCK FLUSH 100 UNIT/ML IV SOLN
INTRAVENOUS | Status: AC | PRN
  Administered 2019-12-08: 500 [IU] via INTRAVENOUS

## 2019-12-08 MED ORDER — LIDOCAINE-EPINEPHRINE 1 %-1:100000 IJ SOLN
INTRAMUSCULAR | Status: AC
  Filled 2019-12-08: qty 1

## 2019-12-08 MED ORDER — LIDOCAINE-EPINEPHRINE (PF) 2 %-1:200000 IJ SOLN
INTRAMUSCULAR | Status: AC
  Filled 2019-12-08: qty 20

## 2019-12-08 MED ORDER — LIDOCAINE HCL (PF) 1 % IJ SOLN
INTRAMUSCULAR | Status: AC | PRN
  Administered 2019-12-08: 10 mL

## 2019-12-08 MED ORDER — HEPARIN SOD (PORK) LOCK FLUSH 100 UNIT/ML IV SOLN
INTRAVENOUS | Status: AC
  Filled 2019-12-08: qty 5

## 2019-12-08 MED ORDER — FENTANYL CITRATE (PF) 100 MCG/2ML IJ SOLN
INTRAMUSCULAR | Status: AC
  Filled 2019-12-08: qty 2

## 2019-12-08 MED ORDER — CEFAZOLIN SODIUM-DEXTROSE 2-4 GM/100ML-% IV SOLN
2.0000 g | Freq: Once | INTRAVENOUS | Status: AC
  Administered 2019-12-08: 2 g via INTRAVENOUS

## 2019-12-08 MED ORDER — SODIUM CHLORIDE 0.9 % IV SOLN: INTRAVENOUS | Status: DC

## 2019-12-08 MED ORDER — MIDAZOLAM HCL 2 MG/2ML IJ SOLN
INTRAMUSCULAR | Status: AC | PRN
  Administered 2019-12-08 (×4): 1 mg via INTRAVENOUS

## 2019-12-08 MED ORDER — LIDOCAINE HCL 1 % IJ SOLN
INTRAMUSCULAR | Status: AC
  Filled 2019-12-08: qty 20

## 2019-12-08 MED ORDER — CEFAZOLIN SODIUM-DEXTROSE 2-4 GM/100ML-% IV SOLN
INTRAVENOUS | Status: AC
  Filled 2019-12-08: qty 100

## 2019-12-08 MED ORDER — MIDAZOLAM HCL 2 MG/2ML IJ SOLN
INTRAMUSCULAR | Status: AC
  Filled 2019-12-08: qty 4

## 2019-12-08 NOTE — H&P (Signed)
Chief Complaint: Patient was seen in consultation today for tunneled central venous catheter with port placement.  Referring Physician(s): Feng,Yan  Supervising Physician: Markus Daft  Patient Status: [facility] - Out-pt  History of Present Illness: Tamara Wood is a 74 y.o. female with a past medical history significant for HTN, DM and pancreatic cancer followed by Dr. Burr Medico who presents today for placement of a tunneled central venous catheter with port to begin systemic therapy. Tamara Wood presented to the ED on 06/04/19 with complaints of RUQ pain and jaundice, she was found to have a biliary obstruction for which she underwent an EGD with biopsy of the duodenum and pancreatic head. Pathology showed chronic pancreatitis and a biliary drain was placed in IR. 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 She was planned to undergo Whipple procedure however this was aborted as the pancreatic mass was densely adherent to the mesenteric vein. She has been followed by oncology and plan is to proceed with systemic therapy. IR has been asked to place a port for durable venous access.  Tamara Wood states she has a lot of back pain today as she did not take her pain medication today and is wondering if she will be given any pain medication today. She otherwise has no complaints currently, she does occasionally get nauseous but feels fine at the moment. She has diffuse bruising to her right hand and forearm which she tells me is from a previous IV at hospital in [place], she states that her arm swelled up significantly and was very bruised but it has continued to improve and has never been painful. She states understanding of the procedure today and is agreeable to proceed as planned.  Past Medical History:    Diagnosis Date  . Diabetes mellitus without complication ([place])   . Hypertension   . Pancreatic cancer [facility])     Past Surgical History:  Procedure Laterality Date  . APPENDECTOMY    . BACK SURGERY    . CHOLECYSTECTOMY    . IR GASTROSTOMY TUBE REMOVAL  11/30/2019    Allergies: Patient has no known allergies.  Medications: Prior to Admission medications   Medication Sig Start Date End Date Taking? Authorizing Provider  amLODipine (NORVASC) 5 MG tablet Take 1 tablet (5 mg total) by mouth daily. 11/22/19  Yes Truitt Merle, MD  ASPIRIN LOW DOSE 81 MG EC tablet Take 81 mg by mouth daily. 07/31/19  Yes [provider]  Calcium Carb-Cholecalciferol (CALCIUM/VITAMIN D) 500-200 MG-UNIT TABS SMARTSIG:1 Tablet(s) By Mouth Twice Daily 11/10/19  Yes [provider]  ibuprofen (ADVIL) 600 MG tablet Take 600 mg by mouth every 6 (six) hours as needed. 10/11/19  Yes [provider]  Magnesium 250 MG TABS Take 1 tablet (250 mg total) by mouth daily. 12/07/19  Yes Isaac Bliss, Rayford Halsted, MD  metFORMIN (GLUCOPHAGE) 1000 MG tablet Take 1 tablet (1,000 mg total) by mouth 2 (two) times daily. 11/22/19  Yes Truitt Merle, MD  metoprolol tartrate (LOPRESSOR) 50 MG tablet Take 1 tablet (50 mg total) by mouth 2 (two) times daily. 12/07/19  Yes Isaac Bliss, Rayford Halsted, MD  ondansetron (ZOFRAN) 8 MG tablet Take 1 tablet (8 mg total) by mouth 2 (two) times daily as needed (Nausea or vomiting). 11/30/19  Yes Truitt Merle, MD  pantoprazole (PROTONIX) 20 MG tablet Take  1 tablet (20 mg total) by mouth daily. 11/22/19  Yes Truitt Merle, MD  vitamin B-12 (CYANOCOBALAMIN) 1000 MCG tablet Take 1 tablet (1,000 mcg total) by mouth daily. 11/22/19  Yes Truitt Merle, MD  metoCLOPramide (REGLAN) 10 MG tablet  10/15/19   [provider]  prochlorperazine (COMPAZINE) 10 MG tablet Take 1 tablet (10 mg total) by mouth every 6 (six) hours as needed (Nausea or vomiting). 11/30/19   Truitt Merle, MD  traMADol (ULTRAM) 50 MG  tablet Take 0.5-1 tablets (25-50 mg total) by mouth every 6 (six) hours as needed. 11/22/19   Truitt Merle, MD     Family History  Problem Relation Age of Onset  . Diabetes Mother   . Diabetes Sister   . Diabetes Brother     Social History   Socioeconomic History  . Marital status: Married    Spouse name: Not on file  . Number of children: 6  . Years of education: Not on file  . Highest education level: Not on file  Occupational History  . Not on file  Tobacco Use  . Smoking status: Never Smoker  . Smokeless tobacco: Never Used  Substance and Sexual Activity  . Alcohol use: Never  . Drug use: Never  . Sexual activity: Not on file  Other Topics Concern  . Not on file  Social History Narrative  . Not on file   Social Determinants of Health   Financial Resource Strain:   . Difficulty of Paying Living Expenses:   Food Insecurity:   . Worried About Charity fundraiser in the Last Year:   . Arboriculturist in the Last Year:   Transportation Needs:   . Film/video editor (Medical):   Marland Kitchen Lack of Transportation (Non-Medical):   Physical Activity:   . Days of Exercise per Week:   . Minutes of Exercise per Session:   Stress:   . Feeling of Stress :   Social Connections:   . Frequency of Communication with Friends and Family:   . Frequency of Social Gatherings with Friends and Family:   . Attends Religious Services:   . Active Member of Clubs or Organizations:   . Attends Archivist Meetings:   Marland Kitchen Marital Status:      Review of Systems: A 12 point ROS discussed and pertinent positives are indicated in the HPI above.  All other systems are negative.  Review of Systems  Constitutional: Negative for appetite change, chills and fever.  Respiratory: Negative for cough and shortness of breath.   Cardiovascular: Negative for chest pain.  Gastrointestinal: Negative for abdominal pain, blood in stool, diarrhea, nausea and vomiting.  Genitourinary: Negative for  hematuria.  Musculoskeletal: Positive for back pain.  Skin: Positive for color change (bruising right hand and forearm).  Neurological: Negative for dizziness and headaches.    Vital Signs: Ht 5\' 4"  (1.626 m)   Wt 130 lb (59 kg)   BMI 22.31 kg/m   Physical Exam HENT:     Head: Normocephalic.     Mouth/Throat:     Mouth: Mucous membranes are moist.     Pharynx: Oropharynx is clear. No oropharyngeal exudate or posterior oropharyngeal erythema.  Cardiovascular:     Rate and Rhythm: Normal rate and regular rhythm.  Pulmonary:     Effort: Pulmonary effort is normal.     Breath sounds: Normal breath sounds.  Abdominal:     General: There is no distension.     Palpations: Abdomen  is soft.     Tenderness: There is no abdominal tenderness.  Skin:    General: Skin is warm and dry.     Findings: Bruising (diffuse; right hand and forearm from previous IV infiltration per patient. Improving, non tender.) present.  Neurological:     Mental Status: She is alert and oriented to person, place, and time.  Psychiatric:        Mood and Affect: Mood normal.        Behavior: Behavior normal.        Thought Content: Thought content normal.        Judgment: Judgment normal.      MD Evaluation Airway: WNL Heart: WNL Abdomen: WNL Chest/ Lungs: WNL ASA  Classification: 3 Mallampati/Airway Score: Two   Imaging: CT Chest W Contrast  Result Date: 11/29/2019 CLINICAL DATA:  History of pancreatic cancer, post aborted Whipple procedure due to venous involvement. EXAM: CT CHEST, ABDOMEN, AND PELVIS WITH CONTRAST TECHNIQUE: Multidetector CT imaging of the chest, abdomen and pelvis was performed following the standard protocol during bolus administration of intravenous contrast. CONTRAST:  116mL OMNIPAQUE IOHEXOL 300 MG/ML  SOLN COMPARISON:  None FINDINGS: CT CHEST FINDINGS Cardiovascular: Calcified atherosclerosis scattered about the thoracic aorta. No aneurysmal dilation. Heart size is normal.  Central pulmonary vasculature shows normal appearance on venous phase assessment. Mediastinum/Nodes: Thoracic inlet structures are normal. No axillary lymphadenopathy. No mediastinal lymphadenopathy. Lungs/Pleura: Basilar atelectasis. No consolidation. No pleural effusion. Elevation of the RIGHT hemidiaphragm. Airways are patent. Musculoskeletal: No chest wall mass, see below for full musculoskeletal details. CT ABDOMEN PELVIS FINDINGS Hepatobiliary: Marked hepatic steatosis. Post cholecystectomy. Pneumobilia following biliary stent placement. Accounting for the degree of steatotic change no focal suspicious hepatic lesion. Pancreas: Ill-defined mass in the pancreatic head abuts the portal vein, some venous distortion at the splenic portal confluence, also with soft tissue and hazy stranding contact beyond 180 degrees of the SMV on image 60 of series 2. 180 degrees or slightly greater involvement of the SMA also on the same image. Masslike area in the head of the pancreas is associated with pancreatic ductal dilation and peripheral atrophy. Area measuring 3.3 x 2.5 cm and associated with obstruction of the duodenum post gastrojejunostomy. Spleen: Normal size without focal lesion. Adrenals/Urinary Tract: Adrenal glands are normal. Mild renal cortical scarring. No hydronephrosis. Urinary bladder is normal. Stomach/Bowel: Post gastrojejunostomy. Obstruction of the duodenum at the level of the pancreatic mass. Post age in ostomy tube placement. No additional sign of bowel obstruction. No sign of acute bowel process. Colonic diverticulosis. Extension of stranding into the transverse mesocolon. Vascular/Lymphatic: Vascular involvement as discussed. Calcific atheromatous plaque of the abdominal aorta. No aneurysmal dilation. Small lymph node inferior to the duodenum anterior to the inferior vena cava approximately 6 mm short axis. There is a small LEFT retrocrural lymph node measuring approximately 9 mm (image 46, series  2) No pelvic adenopathy. Reproductive: Uterus and adnexa grossly unremarkable by CT. Other: No ascites. No peritoneal nodularity. Postoperative changes in the midline from reported laparotomy presumably. No abdominal wall hernia. Wound manager over jejunostomy tube. Musculoskeletal: Spinal degenerative changes. Laminectomy changes at L1 and L2. No acute bone finding. No destructive bone process. IMPRESSION: 1. 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 2. Small lymph nodes in the retrocrural region on the LEFT largest 9 mm of uncertain significance. 3. Infiltration of  the transverse mesocolon and extension of tumor into the root of the small bowel mesentery with abutment of SMV as well at the level of the first jejunal branch as described. 4. Profound hepatic steatosis. 5. Pneumobilia post stenting. 6. Signs of gastrojejunostomy and jejunostomy tube placement. Electronically Signed   By: Zetta Bills M.D.   On: 11/29/2019 17:55   CT Abdomen Pelvis W Contrast  Result Date: 11/29/2019 CLINICAL DATA:  History of pancreatic cancer, post aborted Whipple procedure due to venous involvement. EXAM: CT CHEST, ABDOMEN, AND PELVIS WITH CONTRAST TECHNIQUE: Multidetector CT imaging of the chest, abdomen and pelvis was performed following the standard protocol during bolus administration of intravenous contrast. CONTRAST:  126mL OMNIPAQUE IOHEXOL 300 MG/ML  SOLN COMPARISON:  None FINDINGS: CT CHEST FINDINGS Cardiovascular: Calcified atherosclerosis scattered about the thoracic aorta. No aneurysmal dilation. Heart size is normal. Central pulmonary vasculature shows normal appearance on venous phase assessment. Mediastinum/Nodes: Thoracic inlet structures are normal. No axillary lymphadenopathy. No mediastinal lymphadenopathy.  Lungs/Pleura: Basilar atelectasis. No consolidation. No pleural effusion. Elevation of the RIGHT hemidiaphragm. Airways are patent. Musculoskeletal: No chest wall mass, see below for full musculoskeletal details. CT ABDOMEN PELVIS FINDINGS Hepatobiliary: Marked hepatic steatosis. Post cholecystectomy. Pneumobilia following biliary stent placement. Accounting for the degree of steatotic change no focal suspicious hepatic lesion. Pancreas: Ill-defined mass in the pancreatic head abuts the portal vein, some venous distortion at the splenic portal confluence, also with soft tissue and hazy stranding contact beyond 180 degrees of the SMV on image 60 of series 2. 180 degrees or slightly greater involvement of the SMA also on the same image. Masslike area in the head of the pancreas is associated with pancreatic ductal dilation and peripheral atrophy. Area measuring 3.3 x 2.5 cm and associated with obstruction of the duodenum post gastrojejunostomy. Spleen: Normal size without focal lesion. Adrenals/Urinary Tract: Adrenal glands are normal. Mild renal cortical scarring. No hydronephrosis. Urinary bladder is normal. Stomach/Bowel: Post gastrojejunostomy. Obstruction of the duodenum at the level of the pancreatic mass. Post age in ostomy tube placement. No additional sign of bowel obstruction. No sign of acute bowel process. Colonic diverticulosis. Extension of stranding into the transverse mesocolon. Vascular/Lymphatic: Vascular involvement as discussed. Calcific atheromatous plaque of the abdominal aorta. No aneurysmal dilation. Small lymph node inferior to the duodenum anterior to the inferior vena cava approximately 6 mm short axis. There is a small LEFT retrocrural lymph node measuring approximately 9 mm (image 46, series 2) No pelvic adenopathy. Reproductive: Uterus and adnexa grossly unremarkable by CT. Other: No ascites. No peritoneal nodularity. Postoperative changes in the midline from reported laparotomy  presumably. No abdominal wall hernia. Wound manager over jejunostomy tube. Musculoskeletal: Spinal degenerative changes. Laminectomy changes at L1 and L2. No acute bone finding. No destructive bone process. IMPRESSION: 1. 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 2. Small lymph nodes in the retrocrural region on the LEFT largest 9 mm of uncertain significance. 3. Infiltration of the transverse mesocolon and extension of tumor into the root of the small bowel mesentery with abutment of SMV as well at the level of the first jejunal branch as described. 4. Profound hepatic steatosis. 5. Pneumobilia post stenting. 6. Signs of gastrojejunostomy and jejunostomy tube placement. Electronically Signed   By: Zetta Bills M.D.   On: 11/29/2019 17:55   IR GASTROSTOMY TUBE REMOVAL  Result Date:  11/30/2019 INDICATION: Patient with history of pancreatic cancer and prior aborted Whipple procedure due to venous involvement at [facility] along with gastrojejunostomy/jejunostomy tube placement; patient is currently eating regular diet and request now received for jejunostomy tube removal. EXAM: JEJUNOSTOMY TUBE REMOVAL MEDICATIONS: None ANESTHESIA/SEDATION: None CONTRAST:  None. FLUOROSCOPY TIME:  None COMPLICATIONS: None immediate. PROCEDURE: Informed written consent was obtained from the patient/daughter-in- law after a thorough discussion of the procedural risks, benefits and alternatives. All questions were addressed. Maximal Sterile Barrier Technique was utilized including caps, mask, sterile gowns, sterile gloves, sterile drape, hand hygiene and skin antiseptic. A timeout was performed prior to the initiation of the procedure. The jejunostomy StatLock device was removed and using manual traction the jejunostomy tube  was removed in its entirety without immediate complications. Site was subsequently cleansed again and gauze dressing applied over site. Site care instructions reviewed with patient. IMPRESSION: Successful jejunostomy tube removal as discussed above. Read by: Rowe Robert, PA-C Electronically Signed   By: Jacqulynn Cadet M.D.   On: 11/30/2019 14:52   VAS [place] LOWER EXTREMITY VENOUS (DVT)  Result Date: 11/30/2019  Lower Venous DVTStudy Indications: Calf tenderness.  Risk Factors: Pancreatic cancer. Comparison Study: none Performing Technologist: June Leap RDMS, RVT  Examination Guidelines: A complete evaluation includes B-mode imaging, spectral Doppler, color Doppler, and power Doppler as needed of all accessible portions of each vessel. Bilateral testing is considered an integral part of a complete examination. Limited examinations for reoccurring indications may be performed as noted. The reflux portion of the exam is performed with the patient in reverse Trendelenburg.  +---------+---------------+---------+-----------+----------+--------------+ RIGHT    CompressibilityPhasicitySpontaneityPropertiesThrombus Aging +---------+---------------+---------+-----------+----------+--------------+ CFV      Full           Yes      Yes                                 +---------+---------------+---------+-----------+----------+--------------+ SFJ      Full                                                        +---------+---------------+---------+-----------+----------+--------------+ FV Prox  Full                                                        +---------+---------------+---------+-----------+----------+--------------+ FV Mid   Full                                                        +---------+---------------+---------+-----------+----------+--------------+ FV DistalFull                                                         +---------+---------------+---------+-----------+----------+--------------+ PFV      Full                                                        +---------+---------------+---------+-----------+----------+--------------+  POP      Full           Yes      Yes                                 +---------+---------------+---------+-----------+----------+--------------+ PTV      Full                                                        +---------+---------------+---------+-----------+----------+--------------+ PERO     Full                                                        +---------+---------------+---------+-----------+----------+--------------+   +---------+---------------+---------+-----------+----------+--------------+ LEFT     CompressibilityPhasicitySpontaneityPropertiesThrombus Aging +---------+---------------+---------+-----------+----------+--------------+ CFV      Full           Yes      Yes                                 +---------+---------------+---------+-----------+----------+--------------+ SFJ      Full                                                        +---------+---------------+---------+-----------+----------+--------------+ FV Prox  Full                                                        +---------+---------------+---------+-----------+----------+--------------+ FV Mid   Full                                                        +---------+---------------+---------+-----------+----------+--------------+ FV DistalFull                                                        +---------+---------------+---------+-----------+----------+--------------+ PFV      Full                                                        +---------+---------------+---------+-----------+----------+--------------+ POP      Full           Yes      Yes                                  +---------+---------------+---------+-----------+----------+--------------+  PTV      Full                                                        +---------+---------------+---------+-----------+----------+--------------+ PERO     Full                                                        +---------+---------------+---------+-----------+----------+--------------+     Summary: BILATERAL: - No evidence of deep vein thrombosis seen in the lower extremities, bilaterally. -No evidence of popliteal cyst, bilaterally.   *See table(s) above for measurements and observations. Electronically signed by Deitra Mayo MD on 11/30/2019 at 3:51:41 PM.    Final     Labs:  CBC: Recent Labs    11/23/19 1123 12/08/19 1308  WBC 4.8 5.0  HGB 12.9 11.3*  HCT 38.9 35.2*  PLT 285 283    COAGS: No results for input(s): INR, APTT in the last 8760 hours.  BMP: Recent Labs    11/23/19 1123  NA 141  K 3.6  CL 103  CO2 25  GLUCOSE 202*  BUN 11  CALCIUM 8.7*  CREATININE 0.94  GFRNONAA 59*  GFRAA >60    LIVER FUNCTION TESTS: Recent Labs    11/23/19 1123  BILITOT 0.6  AST 131*  ALT 79*  ALKPHOS 584*  PROT 7.0  ALBUMIN 3.2*    TUMOR MARKERS: No results for input(s): AFPTM, CEA, CA199, CHROMGRNA in the last 8760 hours.  Assessment and Plan:  74 y/o F with stage III pancreatic cancer followed by Dr. Burr Medico who presents today for tunneled central venous catheter with port placement to begin systemic therapy.  Patient has been NPO since 9 pm last night besides a few sips of water this morning with her medications, no current anticoagulation/antiplatelet medications. Afebrile, WBC 5.0, hgb 11.3, plt 283. She is a Sales promotion account executive witness and confirms that she does not wish to receive any blood products, she has also signed a blood products refusal form today.   Risks and benefits of image-guided Port-a-catheter placement were discussed with the patient including, but not limited to  bleeding, infection, pneumothorax, or fibrin sheath development and need for additional procedures.  All of the patient's questions were answered, patient is agreeable to proceed.  Consent signed and in chart.  Thank you for this interesting consult.  I greatly enjoyed meeting Tamara Wood and look forward to participating in their care.  A copy of this report was sent to the requesting provider on this date.  Electronically Signed: Joaquim Nam, PA-C 12/08/2019, 1:56 PM   I spent a total of  30 Minutes  in face to face in clinical consultation, greater than 50% of which was counseling/coordinating care for tunneled central venous catheter with port placement.

## 2019-12-08 NOTE — Procedures (Signed)
Interventional Radiology Procedure:   Indications: Pancreatic adenocarcinoma.  Procedure: Port placement  Findings: Right jugular port, tip at SVC/RA junction  Complications: None     EBL: Minimal, less than 10 ml  Plan: Discharge in one hour.  Keep port site and incisions dry for at least 24 hours.     Anella Nakata R. Anselm Pancoast, MD  Pager: [phone]

## 2019-12-08 NOTE — Progress Notes (Signed)
Preop assessment done with Len Childs (spanish interpreter).

## 2019-12-08 NOTE — Discharge Instructions (Signed)
Implanted Port Insertion, Care After This sheet gives you information about how to care for yourself after your procedure. Your health care provider may also give you more specific instructions. If you have problems or questions, contact your health care provider. What can I expect after the procedure? After the procedure, it is common to have:  Discomfort at the port insertion site.  Bruising on the skin over the port. This should improve over 3-4 days. Follow these instructions at home: [facility] care  After your port is placed, you will get a manufacturer's information card. The card has information about your port. Keep this card with you at all times.  Take care of the port as told by your health care provider. Ask your health care provider if you or a family member can get training for taking care of the port at home. A home health care nurse may also take care of the port.  Make sure to remember what type of port you have. Incision care      Follow instructions from your health care provider about how to take care of your port insertion site. Make sure you: ? Wash your hands with soap and water before and after you change your bandage (dressing). If soap and water are not available, use hand sanitizer. ? You may remove dressing 12/09/19 around 5 PM and shower.   Check your port insertion site every day for signs of infection. Check for: ? Redness, swelling, or pain. ? Fluid or blood. ? Warmth. ? Pus or a bad smell. Activity  Return to your normal activities as told by your health care provider. Ask your health care provider what activities are safe for you.  Do not lift anything that is heavier than 10 lb (4.5 kg), or the limit that you are told, until your health care provider says that it is safe. General instructions  Take over-the-counter and prescription medicines only as told by your health care provider.  Do not take baths, swim, or use a hot tub until your health care  provider approves.You may shower 12/09/19 around 5 PM.  Do not drive for 24 hours if you were given a sedative during your procedure.  Wear a medical alert bracelet in case of an emergency. This will tell any health care providers that you have a port.  Keep all follow-up visits as told by your health care provider. This is important. Contact a health care provider if:  You cannot flush your port with saline as directed, or you cannot draw blood from the port.  You have a fever or chills.  You have redness, swelling, or pain around your port insertion site.  You have fluid or blood coming from your port insertion site.  Your port insertion site feels warm to the touch.  You have pus or a bad smell coming from the port insertion site. Get help right away if:  You have chest pain or shortness of breath.  You have bleeding from your port that you cannot control. Summary  Take care of the port as told by your health care provider. Keep the manufacturer's information card with you at all times.  Change your dressing as told by your health care provider.  Contact a health care provider if you have a fever or chills or if you have redness, swelling, or pain around your port insertion site.  Keep all follow-up visits as told by your health care provider. This information is not intended to replace advice  given to you by your health care provider. Make sure you discuss any questions you have with your health care provider. Document Revised: 12/30/2017 Document Reviewed: 12/30/2017 Elsevier Patient Education  [place].      Moderate Conscious Sedation, Adult, Care After These instructions provide you with information about caring for yourself after your procedure. Your health care provider may also give you more specific instructions. Your treatment has been planned according to current medical practices, but problems sometimes occur. Call your health care provider if you  have any problems or questions after your procedure. What can I expect after the procedure? After your procedure, it is common:  To feel sleepy for several hours.  To feel clumsy and have poor balance for several hours.  To have poor judgment for several hours.  To vomit if you eat too soon. Follow these instructions at home: For at least 24 hours after the procedure:   Do not: ? Participate in activities where you could fall or become injured. ? Drive. ? Use heavy machinery. ? Drink alcohol. ? Take sleeping pills or medicines that cause drowsiness. ? Make important decisions or sign legal documents. ? Take care of children on your own.  Rest. Eating and drinking  Follow the diet recommended by your health care provider.  If you vomit: ? Drink water, juice, or soup when you can drink without vomiting. ? Make sure you have little or no nausea before eating solid foods. General instructions  Have a responsible adult stay with you until you are awake and alert.  Take over-the-counter and prescription medicines only as told by your health care provider.  If you smoke, do not smoke without supervision.  Keep all follow-up visits as told by your health care provider. This is important. Contact a health care provider if:  You keep feeling nauseous or you keep vomiting.  You feel light-headed.  You develop a rash.  You have a fever. Get help right away if:  You have trouble breathing. This information is not intended to replace advice given to you by your health care provider. Make sure you discuss any questions you have with your health care provider. Document Revised: 05/16/2017 Document Reviewed: 09/23/2015 Elsevier Patient Education  2020 [employer].

## 2019-12-09 ENCOUNTER — Other Ambulatory Visit: Payer: Self-pay

## 2019-12-09 ENCOUNTER — Inpatient Hospital Stay: Payer: Medicare Other

## 2019-12-09 VITALS — BP 153/75 | HR 58 | Temp 98.0°F | Resp 18

## 2019-12-09 DIAGNOSIS — M545 Low back pain: Secondary | ICD-10-CM | POA: Diagnosis not present

## 2019-12-09 DIAGNOSIS — C250 Malignant neoplasm of head of pancreas: Secondary | ICD-10-CM

## 2019-12-09 DIAGNOSIS — E119 Type 2 diabetes mellitus without complications: Secondary | ICD-10-CM | POA: Diagnosis not present

## 2019-12-09 DIAGNOSIS — M79604 Pain in right leg: Secondary | ICD-10-CM | POA: Diagnosis not present

## 2019-12-09 DIAGNOSIS — I10 Essential (primary) hypertension: Secondary | ICD-10-CM | POA: Diagnosis not present

## 2019-12-09 DIAGNOSIS — Z5111 Encounter for antineoplastic chemotherapy: Secondary | ICD-10-CM | POA: Diagnosis not present

## 2019-12-09 DIAGNOSIS — Z7984 Long term (current) use of oral hypoglycemic drugs: Secondary | ICD-10-CM | POA: Diagnosis not present

## 2019-12-09 DIAGNOSIS — N390 Urinary tract infection, site not specified: Secondary | ICD-10-CM | POA: Diagnosis not present

## 2019-12-09 DIAGNOSIS — M79605 Pain in left leg: Secondary | ICD-10-CM | POA: Diagnosis not present

## 2019-12-09 DIAGNOSIS — Z7189 Other specified counseling: Secondary | ICD-10-CM

## 2019-12-09 DIAGNOSIS — Z79899 Other long term (current) drug therapy: Secondary | ICD-10-CM | POA: Diagnosis not present

## 2019-12-09 DIAGNOSIS — G8929 Other chronic pain: Secondary | ICD-10-CM | POA: Diagnosis not present

## 2019-12-09 DIAGNOSIS — M549 Dorsalgia, unspecified: Secondary | ICD-10-CM | POA: Diagnosis not present

## 2019-12-09 LAB — CMP (CANCER CENTER ONLY)
ALT: 55 U/L — ABNORMAL HIGH (ref 0–44)
AST: 57 U/L — ABNORMAL HIGH (ref 15–41)
Albumin: 2.7 g/dL — ABNORMAL LOW (ref 3.5–5.0)
Alkaline Phosphatase: 474 U/L — ABNORMAL HIGH (ref 38–126)
Anion gap: 10 (ref 5–15)
BUN: 10 mg/dL (ref 8–23)
CO2: 25 mmol/L (ref 22–32)
Calcium: 8 mg/dL — ABNORMAL LOW (ref 8.9–10.3)
Chloride: 105 mmol/L (ref 98–111)
Creatinine: 0.79 mg/dL (ref 0.44–1.00)
GFR, Est AFR Am: >60 mL/min (ref >60)
GFR, Estimated: >60 mL/min (ref >60)
Glucose, Bld: 224 mg/dL — ABNORMAL HIGH (ref 70–99)
Potassium: 3.3 mmol/L — ABNORMAL LOW (ref 3.5–5.1)
Sodium: 140 mmol/L (ref 135–145)
Total Bilirubin: 0.6 mg/dL (ref 0.3–1.2)
Total Protein: 6.4 g/dL — ABNORMAL LOW (ref 6.5–8.1)

## 2019-12-09 LAB — CBC WITH DIFFERENTIAL (CANCER CENTER ONLY)
Abs Immature Granulocytes: 0.03 10*3/uL (ref 0.00–0.07)
Basophils Absolute: 0 10*3/uL (ref 0.0–0.1)
Basophils Relative: 1 %
Eosinophils Absolute: 0.1 10*3/uL (ref 0.0–0.5)
Eosinophils Relative: 1 %
HCT: 31.3 % — ABNORMAL LOW (ref 36.0–46.0)
Hemoglobin: 10.8 g/dL — ABNORMAL LOW (ref 12.0–15.0)
Immature Granulocytes: 1 %
Lymphocytes Relative: 18 %
Lymphs Abs: 1.1 10*3/uL (ref 0.7–4.0)
MCH: 30.5 pg (ref 26.0–34.0)
MCHC: 34.5 g/dL (ref 30.0–36.0)
MCV: 88.4 fL (ref 80.0–100.0)
Monocytes Absolute: 0.3 10*3/uL (ref 0.1–1.0)
Monocytes Relative: 5 %
Neutro Abs: 4.3 10*3/uL (ref 1.7–7.7)
Neutrophils Relative %: 74 %
Platelet Count: 323 10*3/uL (ref 150–400)
RBC: 3.54 MIL/uL — ABNORMAL LOW (ref 3.87–5.11)
RDW: 13.4 % (ref 11.5–15.5)
WBC Count: 5.8 10*3/uL (ref 4.0–10.5)
nRBC: 0 % (ref 0.0–0.2)

## 2019-12-09 LAB — NO BLOOD PRODUCTS

## 2019-12-09 MED ORDER — SODIUM CHLORIDE 0.9 % IV SOLN
Freq: Once | INTRAVENOUS | Status: AC
  Filled 2019-12-09: qty 250

## 2019-12-09 MED ORDER — HEPARIN SOD (PORK) LOCK FLUSH 100 UNIT/ML IV SOLN
500.0000 [IU] | Freq: Once | INTRAVENOUS | Status: AC | PRN
  Administered 2019-12-09: 500 [IU]
  Filled 2019-12-09: qty 5

## 2019-12-09 MED ORDER — SODIUM CHLORIDE 0.9% FLUSH
10.0000 mL | INTRAVENOUS | Status: DC | PRN
  Administered 2019-12-09: 10 mL
  Filled 2019-12-09: qty 10

## 2019-12-09 MED ORDER — SODIUM CHLORIDE 0.9 % IV SOLN
1000.0000 mg/m2 | Freq: Once | INTRAVENOUS | Status: AC
  Administered 2019-12-09: 1482 mg via INTRAVENOUS
  Filled 2019-12-09: qty 38.98

## 2019-12-09 MED ORDER — PROCHLORPERAZINE MALEATE 10 MG PO TABS
ORAL_TABLET | ORAL | Status: AC
  Filled 2019-12-09: qty 1

## 2019-12-09 MED ORDER — PROCHLORPERAZINE MALEATE 10 MG PO TABS
10.0000 mg | ORAL_TABLET | Freq: Once | ORAL | Status: AC
  Administered 2019-12-09: 10 mg via ORAL

## 2019-12-09 NOTE — Patient Instructions (Addendum)
[facility] Discharge Instructions for Patients Receiving Chemotherapy  Today you received the following chemotherapy agents: Gemzar  To help prevent nausea and vomiting after your treatment, we encourage you to take your nausea medication as directed.   If you develop nausea and vomiting that is not controlled by your nausea medication, call the clinic.   BELOW ARE SYMPTOMS THAT SHOULD BE REPORTED IMMEDIATELY:  *FEVER GREATER THAN 100.5 F  *CHILLS WITH OR WITHOUT FEVER  NAUSEA AND VOMITING THAT IS NOT CONTROLLED WITH YOUR NAUSEA MEDICATION  *UNUSUAL SHORTNESS OF BREATH  *UNUSUAL BRUISING OR BLEEDING  TENDERNESS IN MOUTH AND THROAT WITH OR WITHOUT PRESENCE OF ULCERS  *URINARY PROBLEMS  *BOWEL PROBLEMS  UNUSUAL RASH Items with * indicate a potential emergency and should be followed up as soon as possible.  Feel free to call the clinic should you have any questions or concerns. The clinic phone number is [phone].  Please show the [place] at check-in to the Emergency Department and triage nurse.  Gemcitabine injection Qu es este medicamento? La GEMCITABINA es un agente quimioteraputico. Este medicamento se [place] para tratar muchos tipos de cncer, tales como cncer de mama, cncer de pulmn, cncer de pncreas y cncer de ovario. Este medicamento puede ser utilizado para otros usos; si tiene alguna pregunta consulte con su proveedor de atencin mdica o con su farmacutico. MARCAS COMUNES: Gemzar, Infugem Qu le debo informar a mi profesional de la salud antes de tomar este medicamento? Necesitan saber si usted presenta alguno de los siguientes problemas o situaciones: trastornos sanguneos infeccin enfermedad renal enfermedad heptica enfermedad pulmonar o respiratoria, como asma terapia de radiacin en curso o reciente una reaccin alrgica o inusual a la gemcitabina, a otros medicamentos quimioteraputicos, a otros medicamentos, alimentos,  colorantes o conservantes si est embarazada o buscando quedar embarazada si est amamantando a un beb Cmo debo utilizar este medicamento? Este medicamento se administra mediante infusin por va intravenosa. Lo administra un profesional de la salud calificado en un hospital o en un entorno clnico. Hable con su pediatra para informarse acerca del uso de este medicamento en nios. Puede requerir atencin especial. Sobredosis: Pngase en contacto inmediatamente con un centro toxicolgico o una sala de urgencia si usted cree que haya tomado demasiado medicamento. ATENCIN: [employer] es solo para usted. No comparta este medicamento con nadie. Qu sucede si me olvido de una dosis? Es importante no olvidar ninguna dosis. Informe a su mdico o a su profesional de la salud si no puede asistir a Photographer. Qu puede interactuar con este medicamento?  medicamentos para incrementar los conteos sanguneos, tales como filgrastim, pegfilgrastim, sargramostim  algunos otros agentes quimioteraputicos como cisplatino  vacunas Consulte a su mdico o a su profesional de la salud antes de tomar cualquiera de los siguientes medicamentos:  acetaminofeno  aspirina  ibuprofeno  quetoprofeno  naproxeno Puede ser que esta lista no menciona todas las posibles interacciones. Informe a su profesional de [employer]	[place] de [employer] productos a base de hierbas, medicamentos de [place] o suplementos nutritivos que est tomando. Si usted fuma, consume bebidas alcohlicas o si utiliza drogas ilegales, indqueselo tambin a su profesional de [employer]	[place]. Algunas sustancias pueden interactuar con su medicamento. A qu debo estar atento al usar [employer]? Visite a su mdico para que revise su evolucin. Este medicamento podra hacerle sentir un Nurse, mental health. Esto es normal, ya que la quimioterapia puede afectar tanto a las clulas sanas como a las clulas cancerosas. Si  presenta algn efecto secundario,  infrmelo. Contine con el tratamiento aun si se siente enfermo, a menos que su mdico le indique que lo suspenda. En algunos casos, podra recibir [employer] para ayudarlo con los efectos secundarios. Siga todas las instrucciones para usarlos. Consulte a su mdico o a su profesional de la salud si tiene fiebre, escalofros o dolor de garganta, o cualquier otro sntoma de resfro o gripe. No se trate usted mismo. Este medicamento reduce la capacidad del cuerpo para combatir infecciones. Trate de no acercarse a personas que estn enfermas. Este medicamento podra aumentar el riesgo de moretones o sangrado. Consulte a su mdico o a su profesional de la salud si observa sangrados inusuales. Proceda con cuidado al cepillar sus dientes, usar hilo dental o Risk manager palillos para los dientes, ya que podra contraer una infeccin o Therapist, art con mayor facilidad. Si se somete a algn tratamiento dental, informe a su dentista que est [employer]. Evite usar productos que contienen aspirina, acetaminofeno, ibuprofeno, naproxeno o ketoprofeno, a menos que as lo indique su mdico. Estos productos pueden ocultar la fiebre. No debe quedar embarazada mientras est usando este medicamento o por 6 meses despus de dejar de usarlo. Las mujeres deben informar a su mdico si estn buscando quedar embarazadas o si creen que podran estar embarazadas. Los hombres no deben tener hijos mientras estn recibiendo [employer] y [place] 3 meses despus de dejar de usarlo. Existe la posibilidad de efectos secundarios graves en un beb sin nacer. Para obtener ms informacin, hable con su profesional de la salud o su farmacutico. No debe Economist a un beb mientras est tomando este medicamento o durante al menos 1 semana despus de dejar de usarlo. Los hombres deben informar a su mdico si quieren tener hijos. Este medicamento puede reducir el recuento de esperma. Hable con su mdico o su profesional de la  salud si est preocupado por su fertilidad. Qu efectos secundarios puedo tener al [employer] este medicamento? Efectos secundarios que debe informar a su mdico o a Barrister's clerk de la salud tan pronto como sea posible: Chief of Staff, como erupcin cutnea, comezn/picazn o urticaria, e hinchazn de la cara, los labios o la lengua problemas Theatre stage manager, enrojecimiento o Actor de la inyeccin signos y sntomas de un cambio peligroso en el pulso o ritmo cardiaco, tales como dolor en el pecho; mareos; ritmo cardiaco rpido o irregular; palpitaciones; sensacin de desmayo o aturdimiento, cadas; problemas respiratorios signos de disminucin en la cantidad de plaquetas o sangrado: moretones, puntos rojos en la piel, heces de color negro y aspecto alquitranado, sangre en la orina signos de disminucin en la cantidad de glbulos rojos: cansancio o debilidad inusual, sensacin de Secondary school teacher o aturdimiento, cadas signos de infeccin: fiebre o escalofros, tos, dolor de garganta, dolor o dificultad para orinar signos y sntomas de lesin al rin, tales como dificultad para orinar o cambios en la cantidad de orina signos y sntomas de lesin al hgado, como orina amarilla oscura o [place]; sensacin general de estar enfermo o sntomas gripales; heces claras; prdida del apetito; nuseas; dolor en la regin abdominal superior derecha; cansancio o debilidad inusual; color amarillento de los ojos o la piel hinchazn de tobillos, pies, manos Efectos secundarios que generalmente no requieren atencin mdica (infrmelos a su mdico o a su profesional de la salud si persisten o si son molestos): estreimiento diarrea cada del cabello prdida del apetito nuseas erupcin cutnea vmito Puede ser que esta lista no menciona todos los posibles  efectos secundarios. Comunquese a su mdico por asesoramiento mdico [employer] Usted puede informar los efectos secundarios a la FDA por  telfono al 1-800-FDA-1088. Dnde debo guardar mi medicina? Este medicamento se administra en hospitales o clnicas y no necesitar guardarlo en su domicilio. ATENCIN: Este folleto es un resumen. Puede ser que no cubra toda la posible informacin. Si usted tiene preguntas acerca de esta medicina, consulte con su mdico, su farmacutico o su profesional de Technical sales engineer.  2020 Elsevier/Gold Standard (2017-10-14 00:00:00)

## 2019-12-11 ENCOUNTER — Other Ambulatory Visit: Payer: Self-pay | Admitting: Hematology

## 2019-12-13 ENCOUNTER — Telehealth: Payer: Self-pay | Admitting: Internal Medicine

## 2019-12-13 NOTE — Telephone Encounter (Signed)
Pt is calling in out of the following metformin 1000 MG, One Touch Lancets and Ultra Blue test strips    Pharm:  [facility] [address] [employer]).

## 2019-12-14 MED ORDER — GLUCOSE BLOOD VI STRP: 1.0000 | ORAL_STRIP | Freq: Every day | 12 refills | Status: DC

## 2019-12-14 MED ORDER — METFORMIN HCL 1000 MG PO TABS: 1000.0000 mg | ORAL_TABLET | Freq: Two times a day (BID) | ORAL | 0 refills | Status: DC

## 2019-12-14 MED ORDER — ONETOUCH ULTRASOFT LANCETS MISC
1.0000 | Freq: Every day | 12 refills | Status: DC
Start: 2019-12-14 — End: 2020-01-28

## 2019-12-14 NOTE — Telephone Encounter (Signed)
Refill sent.

## 2019-12-14 NOTE — Telephone Encounter (Signed)
Ok to refill 

## 2019-12-14 NOTE — Addendum Note (Signed)
Addended by: Westley Hummer B on: 12/14/2019 08:38 AM   Modules accepted: Orders

## 2019-12-15 ENCOUNTER — Other Ambulatory Visit: Payer: Self-pay

## 2019-12-15 NOTE — Progress Notes (Signed)
Tamara Wood   [phone] Fax:([phone]) [phone]   Clinic Follow up Note   Patient Care Team: Isaac Bliss, Rayford Halsted, MD as PCP - General (Internal Medicine) Jonnie Finner, RN as Oncology Nurse Navigator Truitt Merle, MD as Consulting Physician (Hematology) 12/16/2019  CHIEF COMPLAINT: F/u pancreas cancer   SUMMARY OF ONCOLOGIC HISTORY: Oncology History Overview Note  Cancer Staging Pancreatic cancer [facility]) Staging form: Exocrine Pancreas, AJCC 8th Edition - Clinical: Stage III (cT4, cN1, cM0) - Signed by Truitt Merle, MD on 11/30/2019    Pancreatic cancer ([place])  06/04/2019 -  Hospital Admission   Admit date: 06/04/2019 Admission diagnosis: biliary obstruction  Additional comments: Patient presented with right upper quadrant abdominal pain, jaundice.  She was admitted to [facility] in [place].  She was noticed to have intrahepatic and extrahepatic biliary dilatation along with main pancreatic duct dilatation on CT 1 months prior.  MRCP showed a possible slight progression of the biliary and pancreatic duct dilatation.  Patient was evaluated by GI, underwent EGD.  Biopsy of the duodenum and increase did not review malignant cells, biopsy of pancreatic head showed chronic pancreatitis.  Patient underwent biliary drain placement by IR.  She was discharged home on June 12, 2019.   06/07/2019 Procedure   EUS with biopsy by Dr. Glade Stanford at [facility], [place].  Impression: Partial obstruction of the duodenal and the second portion in the expected area of the ampulla.  The overlying mucosa on this edematous folds which were causing the obstruction appeared normal with the exception of one area which was biopsied.    06/10/2019 Pathology Results   Common bile duct for cytology: Negative for malignant cells. Biopsy head of pancreas: chronic Pancreatitis   06/10/2019 Procedure   IR placed a biliary drain for obstructive jaundice      07/30/2019 Imaging   CT abdomen and pelvis with contrast showed increased moderate distention of duodenal bulb without frank gastric distention.  Gallbladder absent, there is a stent in the low common bile duct without a significant dilatation.  There remains an external transhepatic biliary drain.  Increased to moderate dilatation of the pancreatic duct.  There is ill-defined fullness of the pancreatic head which is similar to prior exam.  The patient has underwent EUS aspiration and biliary stricture brushing without demonstration of tumor.  Pancreatic head the biopsy came back chronic pancreatitis.  Occult tumor remains possible.   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  Whipple procedure was planned but aborted because of him pancreatic mass was densely adherent to the mesentery vein.   11/21/2019 Initial Diagnosis   Pancreatic cancer ([place])   11/29/2019 Imaging   CT CAP W Contrast    IMPRESSION: 1. 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 2. Small lymph nodes in the retrocrural region on the LEFT largest 9 mm of uncertain significance. 3. Infiltration of the transverse mesocolon and extension of tumor into the root of the small bowel mesentery with abutment of SMV as well at the level of the first jejunal branch as described. 4. Profound hepatic steatosis. 5. Pneumobilia post stenting. 6. Signs of gastrojejunostomy  and jejunostomy tube placement.    Chemotherapy   PENDING Gemcitabine 2 weeks on/1 week off. Plan to add  Abraxane to C2    11/30/2019 Cancer Staging   Staging form: Exocrine Pancreas, AJCC 8th Edition - Clinical: Stage III (cT4, cN1, cM0) - Signed by Truitt Merle, MD on 11/30/2019   12/09/2019 -  Chemotherapy   The patient had PACLitaxel-protein bound (ABRAXANE) chemo infusion 200 mg, 125 mg/m2 = 200 mg, Intravenous,  Once, 0 of 3 cycles gemcitabine (GEMZAR) 1,482 mg in sodium chloride 0.9 % 250 mL chemo infusion, 1,000 mg/m2 = 1,482 mg, Intravenous,  Once, 1 of 4 cycles Administration: 1,482 mg (12/09/2019)  for chemotherapy treatment.      CURRENT THERAPY:  Gemcitabine 2 weeks on/1 week off. Plan to add Abraxane to C2, started 12/09/19.   INTERVAL HISTORY: Tamara Wood returns for f/u and treatment. She began cycle 1 day 1 gemcitabine on 12/09/19.  She presents with her family and Spanish interpreter.  She woke up this morning feeling nervous and nauseous.  She had mild intermittent nausea without vomiting after chemo but was able to manage with antiemetics.  She thinks she ate and drank well.  Bowels moving normally except one loose stool today she attributes to carrot/beet juice.  She was able to maintain adequate energy level and activity.  She continues to report right back and side pain.  Right now she rates pain 2/10 because she is calm, can exacerbate to severe pain.  Tramadol was unhelpful.  Oxycodone was too strong and made her sleep.  Takes Tylenol every 5 hours.  She has mild tingling in fingertips she did not notice before. She otherwise denies fever, chills, mucositis, cough, chest pain, dyspnea, leg edema.   MEDICAL HISTORY:  Past Medical History:  Diagnosis Date  . Diabetes mellitus without complication ([place])   . Hypertension   . Pancreatic cancer [facility])     SURGICAL HISTORY: Past Surgical History:  Procedure Laterality Date  . APPENDECTOMY    . BACK SURGERY    . CHOLECYSTECTOMY    . IR GASTROSTOMY TUBE REMOVAL  11/30/2019  . IR IMAGING GUIDED PORT INSERTION  12/08/2019    I  have reviewed the social history and family history with the patient and they are unchanged from previous note.  ALLERGIES:  has No Known Allergies.  MEDICATIONS:  Current Outpatient Medications  Medication Sig Dispense Refill  . amLODipine (NORVASC) 5 MG tablet Take 1 tablet (5 mg total) by mouth daily. 30 tablet 0  . ASPIRIN LOW DOSE 81 MG EC tablet Take 81 mg by mouth daily.    . Calcium Carb-Cholecalciferol (CALCIUM/VITAMIN D) 500-200 MG-UNIT TABS SMARTSIG:1 Tablet(s) By Mouth Twice Daily    . glucose blood test strip 1 each by Other route daily. 100 each 12  . ibuprofen (ADVIL) 600 MG tablet Take 600 mg by mouth every 6 (six) hours as needed.    . Lancets (ONETOUCH ULTRASOFT) lancets 1 each by Other route daily. 100 each 12  . Magnesium 250 MG TABS Take 1 tablet (250 mg total) by mouth daily. 90 tablet 1  . metFORMIN (GLUCOPHAGE) 1000 MG tablet Take 1 tablet (1,000 mg total) by mouth 2 (two) times daily. 180 tablet 0  . metoCLOPramide (REGLAN) 10 MG tablet     . metoprolol tartrate (LOPRESSOR) 50 MG tablet Take 1 tablet (50 mg total) by mouth 2 (two) times daily. 180 tablet 1  . ondansetron (ZOFRAN) 8 MG tablet Take 1 tablet (8 mg  total) by mouth 2 (two) times daily as needed (Nausea or vomiting). 30 tablet 1  . pantoprazole (PROTONIX) 20 MG tablet Take 1 tablet (20 mg total) by mouth daily. 90 tablet 1  . prochlorperazine (COMPAZINE) 10 MG tablet Take 1 tablet (10 mg total) by mouth every 6 (six) hours as needed (Nausea or vomiting). 30 tablet 1  . traMADol (ULTRAM) 50 MG tablet Take 0.5-1 tablets (25-50 mg total) by mouth every 6 (six) hours as needed. 30 tablet 0  . vitamin B-12 (CYANOCOBALAMIN) 1000 MCG tablet Take 1 tablet (1,000 mcg total) by mouth daily. 90 tablet 1  . HYDROcodone-acetaminophen (NORCO) 5-325 MG tablet Take 1-2 tablets by mouth every 6 (six) hours as needed for moderate pain or severe pain. 30 tablet 0  . lidocaine-prilocaine (EMLA) cream Apply 1 application  topically as needed. 30 g 3  . potassium chloride SA (KLOR-CON) 20 MEQ tablet Take 1 tablet (20 mEq total) by mouth daily. 30 tablet 1   No current facility-administered medications for this visit.   Facility-Administered Medications Ordered in Other Visits  Medication Dose Route Frequency Provider Last Rate Last Admin  . gemcitabine (GEMZAR) 1,482 mg in sodium chloride 0.9 % 250 mL chemo infusion  1,000 mg/m2 (Order-Specific) Intravenous Once Truitt Merle, MD      . heparin lock flush 100 unit/mL  500 Units Intracatheter Once PRN Truitt Merle, MD      . sodium chloride flush (NS) 0.9 % injection 10 mL  10 mL Intracatheter PRN Truitt Merle, MD        PHYSICAL EXAMINATION: ECOG PERFORMANCE STATUS: 1 - Symptomatic but completely ambulatory  Vitals:   12/16/19 1134  BP: (!) 146/76  Pulse: 63  Resp: 20  Temp: 98.5 F (36.9 C)  SpO2: 98%   Filed Weights   12/16/19 1134  Weight: 126 lb 4.8 oz (57.3 kg)    GENERAL:alert, no distress and comfortable SKIN: No rash to exposed skin EYES: sclera clear OROPHARYNX: No thrush or ulcers LUNGS: clear with normal breathing effort HEART: regular rate & rhythm, no lower extremity edema ABDOMEN:abdomen soft with normal bowel sounds.  Mild RUQ tenderness Musculoskeletal: Mild tenderness to right\flank NEURO: alert & oriented x 3 with fluent speech, no focal motor/sensory deficits PAC covered with gauze  LABORATORY DATA:  I have reviewed the data as listed CBC Latest Ref Rng & Units 12/16/2019 12/09/2019 12/08/2019  WBC 4.0 - 10.5 K/uL 3.6(L) 5.8 5.0  Hemoglobin 12.0 - 15.0 g/dL 10.8(L) 10.8(L) 11.3(L)  Hematocrit 36 - 46 % 32.2(L) 31.3(L) 35.2(L)  Platelets 150 - 400 K/uL 277 323 283     CMP Latest Ref Rng & Units 12/16/2019 12/09/2019 11/23/2019  Glucose 70 - 99 mg/dL 216(H) 224(H) 202(H)  BUN 8 - 23 mg/dL 8 10 11   Creatinine 0.44 - 1.00 mg/dL 0.76 0.79 0.94  Sodium 135 - 145 mmol/L 139 140 141  Potassium 3.5 - 5.1 mmol/L 3.3(L) 3.3(L) 3.6  Chloride  98 - 111 mmol/L 102 105 103  CO2 22 - 32 mmol/L 25 25 25   Calcium 8.9 - 10.3 mg/dL 7.7(L) 8.0(L) 8.7(L)  Total Protein 6.5 - 8.1 g/dL 6.7 6.4(L) 7.0  Total Bilirubin 0.3 - 1.2 mg/dL 0.6 0.6 0.6  Alkaline Phos 38 - 126 U/L 861(H) 474(H) 584(H)  AST 15 - 41 U/L 137(H) 57(H) 131(H)  ALT 0 - 44 U/L 87(H) 55(H) 79(H)      RADIOGRAPHIC STUDIES: I have personally reviewed the radiological images as listed and agreed with the  findings in the report. No results found.   ASSESSMENT & PLAN: Tamara Wood is a 74 y.o. female with    1. Pancreatic Cancer, stage III, unresectable -She initially presented to hospital in [place] with RUQ abdominal and jaundice. Her work up showed intrahepatic and extrahepatic biliary dilatation. EGDwith biopsy ofduodenal mucosa and head of pancreasshowed no cancer cells at that time. Sheunderwent biliary drain placement by IRin 05/2020 which was removed in 09/2019.  -She underwent biopsy of pancreatic head mass on 08/10/19 which showedinvasive well differentiated adenocarcinoma,peripancreaticLN biopsy wasalso positive.  -She underwentWhipple Surgery on 08/29/19 but was aborted due to tumor invading the SMV. She instead hadgastrojejunostomy and J-tube placementdue tobowelblockages.  -Her tumor has invaded her mesentery and vascular structures around her pancreas, making her tumor unresectable. It is no longer curable in this case but still treatable to control her disease and prolong her life.  -CT CAP from 11/29/19 shows no evidence of distant metastasis, but again demonstrated locally advanced disease. Her baseline Ca 19.9 was elevated at 911 which is concerning for high tumor burden and developing distant metastasis.  -Given her age, Dr. Burr Medico recommended (and agreed upon by multidisciplinary team) systemic chemo with Gemcitabine with or without Abraxane 2 weeks on/1 week off for 3-6 months followed by consolidation Radiation. Plan to give gemcitabine alone for  first cycle,  If tolerable on single agent will add Abraxane to C2.  -goal is palliative -Tamara Wood began single agent C1D1 gemcitbaine on 01/08/20, she tolerated well with mild but well controlled nausea. She has lost some weight from 11/2019, not able to tell if that occurred after chemo. We reviewed nutrition and symptom management. I recommend alternating anti-emetics and taking before meals to promote oral intake. She otherwise did well, she recovers well  -she has persistent right back/flank pain and RUQ tenderness on exam. Tramadol and oxycodone have not been effective. Will try hydrocodone-acet PRN -CBC stable. K 3.3, I prescribed oral supplement 20 mEq once daily. LFTs especially alk phos increased after cycle 1 with normal Tbili, possibly related to gemcitabine. No known liver metastasis. She had a CBD stent that was removed previously.  She has mild RUQ tenderness on exam that could be residual from surgery. She has been taking tylenol around the clock, I recommend to avoid additional tylenol.  -I recommend to proceed with C1D8 single agent gemcitabine today at full dose. She will return for lab next week to monitor LFTs.  -she will return for f/u before cycle 2 in 2 weeks. If she tolerates well today, plan to add abraxane with C2.    2. Nutrition  -During her 08/29/19 surgery her whipple was aborted and instead had bypass of duodenum and CBD due to blockages.She also had J-tube placed at that time.  -J tube removed 6/15 -using supplements, weight down 4 lbs from last month. I encouraged her to increase supplements PRN  3. Comorbidities: HTN, DM, H/o of spinal tumor, Chronic back pain  -followed by PCP Dr. 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 When she moved to [place] in 09/1989 she had back surgery and had it removed.  -She has persistent mid lower back pain radiates to right side/flank. Tramadol was not helpful.  Changed to oxycodone which was too strong and made her sleep -I will call in norco for her to try. We reviewed side effects, she will watch her bowels.   5. Social Support  -She is originally from [place]  She moved to [place] in 09/1989. She is married with 4 children.  -After medical issues since late 05/2019 her son and his wife who are fluent in [place] moved to [place]. After cancer diagnosis, all of them moved to [place], [place] in early May given 3 of her children live in [place]. She has great family support  -She is a Jehovah Witness  6. B/l leg pain  -doppler negative for DVT -no complaints today   7. Goal of care discussion  -The goal of care is palliative. -she is full code for now   PLAN: -Labs reviewed -Proceed with C1D8 gemcitabine today -lab next week to monitor LFTs -F/u and cycle 2 day 1 in 2 weeks, if she tolerates cycle 1 plan to add abraxane  -Rx: potassium, Emla, Norco   All questions were answered. The patient knows to call the clinic with any problems, questions or concerns. No barriers to learning was detected. Total encounter time was 30 minutes.      Alla Feeling, NP 12/16/19

## 2019-12-16 ENCOUNTER — Encounter: Payer: Self-pay | Admitting: Nurse Practitioner

## 2019-12-16 ENCOUNTER — Other Ambulatory Visit: Payer: Self-pay

## 2019-12-16 ENCOUNTER — Telehealth: Payer: Self-pay | Admitting: Nurse Practitioner

## 2019-12-16 ENCOUNTER — Inpatient Hospital Stay: Payer: 59 | Attending: Hematology

## 2019-12-16 ENCOUNTER — Inpatient Hospital Stay: Payer: 59

## 2019-12-16 ENCOUNTER — Inpatient Hospital Stay (HOSPITAL_BASED_OUTPATIENT_CLINIC_OR_DEPARTMENT_OTHER): Payer: 59 | Admitting: Nurse Practitioner

## 2019-12-16 VITALS — BP 146/76 | HR 63 | Temp 98.5°F | Resp 20 | Ht 64.0 in | Wt 126.3 lb

## 2019-12-16 DIAGNOSIS — Z79899 Other long term (current) drug therapy: Secondary | ICD-10-CM | POA: Diagnosis not present

## 2019-12-16 DIAGNOSIS — M79605 Pain in left leg: Secondary | ICD-10-CM | POA: Diagnosis not present

## 2019-12-16 DIAGNOSIS — M545 Low back pain: Secondary | ICD-10-CM | POA: Insufficient documentation

## 2019-12-16 DIAGNOSIS — D701 Agranulocytosis secondary to cancer chemotherapy: Secondary | ICD-10-CM | POA: Insufficient documentation

## 2019-12-16 DIAGNOSIS — M79604 Pain in right leg: Secondary | ICD-10-CM | POA: Diagnosis not present

## 2019-12-16 DIAGNOSIS — E119 Type 2 diabetes mellitus without complications: Secondary | ICD-10-CM | POA: Insufficient documentation

## 2019-12-16 DIAGNOSIS — G8929 Other chronic pain: Secondary | ICD-10-CM | POA: Insufficient documentation

## 2019-12-16 DIAGNOSIS — Z7984 Long term (current) use of oral hypoglycemic drugs: Secondary | ICD-10-CM | POA: Diagnosis not present

## 2019-12-16 DIAGNOSIS — Z95828 Presence of other vascular implants and grafts: Secondary | ICD-10-CM | POA: Insufficient documentation

## 2019-12-16 DIAGNOSIS — G893 Neoplasm related pain (acute) (chronic): Secondary | ICD-10-CM | POA: Diagnosis not present

## 2019-12-16 DIAGNOSIS — Z5111 Encounter for antineoplastic chemotherapy: Secondary | ICD-10-CM | POA: Insufficient documentation

## 2019-12-16 DIAGNOSIS — C250 Malignant neoplasm of head of pancreas: Secondary | ICD-10-CM | POA: Insufficient documentation

## 2019-12-16 DIAGNOSIS — D6481 Anemia due to antineoplastic chemotherapy: Secondary | ICD-10-CM | POA: Insufficient documentation

## 2019-12-16 DIAGNOSIS — I10 Essential (primary) hypertension: Secondary | ICD-10-CM | POA: Insufficient documentation

## 2019-12-16 DIAGNOSIS — Z7189 Other specified counseling: Secondary | ICD-10-CM

## 2019-12-16 DIAGNOSIS — E46 Unspecified protein-calorie malnutrition: Secondary | ICD-10-CM | POA: Diagnosis not present

## 2019-12-16 LAB — CMP (CANCER CENTER ONLY)
ALT: 87 U/L — ABNORMAL HIGH (ref 0–44)
AST: 137 U/L — ABNORMAL HIGH (ref 15–41)
Albumin: 2.7 g/dL — ABNORMAL LOW (ref 3.5–5.0)
Alkaline Phosphatase: 861 U/L — ABNORMAL HIGH (ref 38–126)
Anion gap: 12 (ref 5–15)
BUN: 8 mg/dL (ref 8–23)
CO2: 25 mmol/L (ref 22–32)
Calcium: 7.7 mg/dL — ABNORMAL LOW (ref 8.9–10.3)
Chloride: 102 mmol/L (ref 98–111)
Creatinine: 0.76 mg/dL (ref 0.44–1.00)
GFR, Est AFR Am: >60 mL/min (ref >60)
GFR, Estimated: >60 mL/min (ref >60)
Glucose, Bld: 216 mg/dL — ABNORMAL HIGH (ref 70–99)
Potassium: 3.3 mmol/L — ABNORMAL LOW (ref 3.5–5.1)
Sodium: 139 mmol/L (ref 135–145)
Total Bilirubin: 0.6 mg/dL (ref 0.3–1.2)
Total Protein: 6.7 g/dL (ref 6.5–8.1)

## 2019-12-16 LAB — CBC WITH DIFFERENTIAL (CANCER CENTER ONLY)
Abs Immature Granulocytes: 0.03 10*3/uL (ref 0.00–0.07)
Basophils Absolute: 0 10*3/uL (ref 0.0–0.1)
Basophils Relative: 1 %
Eosinophils Absolute: 0 10*3/uL (ref 0.0–0.5)
Eosinophils Relative: 1 %
HCT: 32.2 % — ABNORMAL LOW (ref 36.0–46.0)
Hemoglobin: 10.8 g/dL — ABNORMAL LOW (ref 12.0–15.0)
Immature Granulocytes: 1 %
Lymphocytes Relative: 25 %
Lymphs Abs: 0.9 10*3/uL (ref 0.7–4.0)
MCH: 30 pg (ref 26.0–34.0)
MCHC: 33.5 g/dL (ref 30.0–36.0)
MCV: 89.4 fL (ref 80.0–100.0)
Monocytes Absolute: 0.2 10*3/uL (ref 0.1–1.0)
Monocytes Relative: 6 %
Neutro Abs: 2.4 10*3/uL (ref 1.7–7.7)
Neutrophils Relative %: 66 %
Platelet Count: 277 10*3/uL (ref 150–400)
RBC: 3.6 MIL/uL — ABNORMAL LOW (ref 3.87–5.11)
RDW: 13.2 % (ref 11.5–15.5)
WBC Count: 3.6 10*3/uL — ABNORMAL LOW (ref 4.0–10.5)
nRBC: 0 % (ref 0.0–0.2)

## 2019-12-16 MED ORDER — LIDOCAINE-PRILOCAINE 2.5-2.5 % EX CREA
1.0000 | TOPICAL_CREAM | CUTANEOUS | 3 refills | Status: DC | PRN
Start: 2019-12-16 — End: 2020-06-08

## 2019-12-16 MED ORDER — POTASSIUM CHLORIDE CRYS ER 20 MEQ PO TBCR
20.0000 meq | EXTENDED_RELEASE_TABLET | Freq: Every day | ORAL | 1 refills | Status: DC
Start: 2019-12-16 — End: 2020-03-09

## 2019-12-16 MED ORDER — PROCHLORPERAZINE MALEATE 10 MG PO TABS
10.0000 mg | ORAL_TABLET | Freq: Once | ORAL | Status: AC
  Administered 2019-12-16: 10 mg via ORAL

## 2019-12-16 MED ORDER — SODIUM CHLORIDE 0.9 % IV SOLN
1000.0000 mg/m2 | Freq: Once | INTRAVENOUS | Status: AC
  Administered 2019-12-16: 1482 mg via INTRAVENOUS
  Filled 2019-12-16: qty 38.98

## 2019-12-16 MED ORDER — PROCHLORPERAZINE MALEATE 10 MG PO TABS
ORAL_TABLET | ORAL | Status: AC
  Filled 2019-12-16: qty 1

## 2019-12-16 MED ORDER — HYDROCODONE-ACETAMINOPHEN 5-325 MG PO TABS: 1.0000 | ORAL_TABLET | Freq: Four times a day (QID) | ORAL | 0 refills | Status: DC | PRN

## 2019-12-16 MED ORDER — SODIUM CHLORIDE 0.9 % IV SOLN
Freq: Once | INTRAVENOUS | Status: AC
  Filled 2019-12-16: qty 250

## 2019-12-16 MED ORDER — SODIUM CHLORIDE 0.9% FLUSH
10.0000 mL | Freq: Once | INTRAVENOUS | Status: AC
  Administered 2019-12-16: 10 mL
  Filled 2019-12-16: qty 10

## 2019-12-16 MED ORDER — SODIUM CHLORIDE 0.9% FLUSH
10.0000 mL | INTRAVENOUS | Status: DC | PRN
  Administered 2019-12-16: 10 mL
  Filled 2019-12-16: qty 10

## 2019-12-16 MED ORDER — HEPARIN SOD (PORK) LOCK FLUSH 100 UNIT/ML IV SOLN
500.0000 [IU] | Freq: Once | INTRAVENOUS | Status: AC | PRN
  Administered 2019-12-16: 500 [IU]
  Filled 2019-12-16: qty 5

## 2019-12-16 NOTE — Telephone Encounter (Signed)
Pt request thursdays only at 11am. OK per Regan Rakers burton to schedule.

## 2019-12-16 NOTE — Telephone Encounter (Signed)
schededuled per 7/1 los. Pt is aware of appt times and dates. No avs or calendar needed.

## 2019-12-16 NOTE — Progress Notes (Signed)
Okay to treat with elevated LFTs per Lacie NP 

## 2019-12-16 NOTE — Patient Instructions (Signed)
[facility] °Discharge Instructions for Patients Receiving Chemotherapy ° °Today you received the following chemotherapy agents Gemzar ° °To help prevent nausea and vomiting after your treatment, we encourage you to take your nausea medication as directed. °  °If you develop nausea and vomiting that is not controlled by your nausea medication, call the clinic.  ° °BELOW ARE SYMPTOMS THAT SHOULD BE REPORTED IMMEDIATELY: °· *FEVER GREATER THAN 100.5 F °· *CHILLS WITH OR WITHOUT FEVER °· NAUSEA AND VOMITING THAT IS NOT CONTROLLED WITH YOUR NAUSEA MEDICATION °· *UNUSUAL SHORTNESS OF BREATH °· *UNUSUAL BRUISING OR BLEEDING °· TENDERNESS IN MOUTH AND THROAT WITH OR WITHOUT PRESENCE OF ULCERS °· *URINARY PROBLEMS °· *BOWEL PROBLEMS °· UNUSUAL RASH °Items with * indicate a potential emergency and should be followed up as soon as possible. ° °Feel free to call the clinic should you have any questions or concerns. The clinic phone number is [phone]. ° °Please show the CHEMO ALERT CARD at check-in to the Emergency Department and triage nurse. ° ° °

## 2019-12-17 ENCOUNTER — Other Ambulatory Visit: Payer: Self-pay | Admitting: Hematology

## 2019-12-17 LAB — CANCER ANTIGEN 19-9: CA 19-9: 2245 U/mL — ABNORMAL HIGH (ref 0–35)

## 2019-12-22 ENCOUNTER — Telehealth: Payer: Self-pay | Admitting: Internal Medicine

## 2019-12-22 MED ORDER — AMLODIPINE BESYLATE 5 MG PO TABS: 5.0000 mg | ORAL_TABLET | Freq: Every day | ORAL | 1 refills | Status: DC

## 2019-12-22 NOTE — Telephone Encounter (Signed)
Medication Refill:  Amlodipine  Pharmacy:  [id number] [address], [place] Phone:  [phone]  Fax:  [phone]

## 2019-12-22 NOTE — Telephone Encounter (Signed)
Refill sent.

## 2019-12-23 ENCOUNTER — Inpatient Hospital Stay: Payer: 59

## 2019-12-23 ENCOUNTER — Other Ambulatory Visit: Payer: Self-pay

## 2019-12-23 DIAGNOSIS — C250 Malignant neoplasm of head of pancreas: Secondary | ICD-10-CM

## 2019-12-23 DIAGNOSIS — Z5111 Encounter for antineoplastic chemotherapy: Secondary | ICD-10-CM | POA: Diagnosis not present

## 2019-12-23 DIAGNOSIS — Z95828 Presence of other vascular implants and grafts: Secondary | ICD-10-CM

## 2019-12-23 LAB — CMP (CANCER CENTER ONLY)
ALT: 63 U/L — ABNORMAL HIGH (ref 0–44)
AST: 117 U/L — ABNORMAL HIGH (ref 15–41)
Albumin: 2.7 g/dL — ABNORMAL LOW (ref 3.5–5.0)
Alkaline Phosphatase: 952 U/L — ABNORMAL HIGH (ref 38–126)
Anion gap: 13 (ref 5–15)
BUN: 12 mg/dL (ref 8–23)
CO2: 27 mmol/L (ref 22–32)
Calcium: 8.4 mg/dL — ABNORMAL LOW (ref 8.9–10.3)
Chloride: 98 mmol/L (ref 98–111)
Creatinine: 0.8 mg/dL (ref 0.44–1.00)
GFR, Est AFR Am: >60 mL/min (ref >60)
GFR, Estimated: >60 mL/min (ref >60)
Glucose, Bld: 187 mg/dL — ABNORMAL HIGH (ref 70–99)
Potassium: 4 mmol/L (ref 3.5–5.1)
Sodium: 138 mmol/L (ref 135–145)
Total Bilirubin: 0.5 mg/dL (ref 0.3–1.2)
Total Protein: 7 g/dL (ref 6.5–8.1)

## 2019-12-23 LAB — CBC WITH DIFFERENTIAL (CANCER CENTER ONLY)
Abs Immature Granulocytes: 0.02 10*3/uL (ref 0.00–0.07)
Basophils Absolute: 0 10*3/uL (ref 0.0–0.1)
Basophils Relative: 1 %
Eosinophils Absolute: 0 10*3/uL (ref 0.0–0.5)
Eosinophils Relative: 0 %
HCT: 31.2 % — ABNORMAL LOW (ref 36.0–46.0)
Hemoglobin: 10.2 g/dL — ABNORMAL LOW (ref 12.0–15.0)
Immature Granulocytes: 1 %
Lymphocytes Relative: 30 %
Lymphs Abs: 1 10*3/uL (ref 0.7–4.0)
MCH: 29.8 pg (ref 26.0–34.0)
MCHC: 32.7 g/dL (ref 30.0–36.0)
MCV: 91.2 fL (ref 80.0–100.0)
Monocytes Absolute: 0.2 10*3/uL (ref 0.1–1.0)
Monocytes Relative: 5 %
Neutro Abs: 2.2 10*3/uL (ref 1.7–7.7)
Neutrophils Relative %: 63 %
Platelet Count: 190 10*3/uL (ref 150–400)
RBC: 3.42 MIL/uL — ABNORMAL LOW (ref 3.87–5.11)
RDW: 12.9 % (ref 11.5–15.5)
WBC Count: 3.4 10*3/uL — ABNORMAL LOW (ref 4.0–10.5)
nRBC: 0 % (ref 0.0–0.2)

## 2019-12-23 MED ORDER — HEPARIN SOD (PORK) LOCK FLUSH 100 UNIT/ML IV SOLN
500.0000 [IU] | Freq: Once | INTRAVENOUS | Status: AC
  Administered 2019-12-23: 500 [IU]
  Filled 2019-12-23: qty 5

## 2019-12-23 MED ORDER — SODIUM CHLORIDE 0.9% FLUSH
10.0000 mL | Freq: Once | INTRAVENOUS | Status: AC
  Administered 2019-12-23: 10 mL
  Filled 2019-12-23: qty 10

## 2019-12-24 NOTE — Progress Notes (Signed)
Pharmacist Chemotherapy Monitoring - Follow Up Assessment    I verify that I have reviewed each item in the below checklist:  . Regimen for the patient is scheduled for the appropriate day and plan matches scheduled date. Marland Kitchen Appropriate non-routine labs are ordered dependent on drug ordered. . If applicable, additional medications reviewed and ordered per protocol based on lifetime cumulative doses and/or treatment regimen.   Plan for follow-up and/or issues identified: No . I-vent associated with next due treatment: No . MD and/or nursing notified: No  Philomena Course 12/24/2019 10:01 AM

## 2019-12-25 ENCOUNTER — Other Ambulatory Visit: Payer: Self-pay

## 2019-12-25 ENCOUNTER — Emergency Department (HOSPITAL_COMMUNITY)
Admission: EM | Admit: 2019-12-25 | Discharge: 2019-12-25 | Disposition: A | Discharge status: Home / Self Care | Payer: 59 | Attending: Emergency Medicine | Admitting: Emergency Medicine

## 2019-12-25 ENCOUNTER — Encounter (HOSPITAL_COMMUNITY): Payer: Self-pay | Admitting: Emergency Medicine

## 2019-12-25 DIAGNOSIS — E119 Type 2 diabetes mellitus without complications: Secondary | ICD-10-CM | POA: Diagnosis not present

## 2019-12-25 DIAGNOSIS — I10 Essential (primary) hypertension: Secondary | ICD-10-CM | POA: Diagnosis not present

## 2019-12-25 DIAGNOSIS — C259 Malignant neoplasm of pancreas, unspecified: Secondary | ICD-10-CM | POA: Diagnosis not present

## 2019-12-25 DIAGNOSIS — Z760 Encounter for issue of repeat prescription: Secondary | ICD-10-CM

## 2019-12-25 DIAGNOSIS — Z79899 Other long term (current) drug therapy: Secondary | ICD-10-CM | POA: Diagnosis not present

## 2019-12-25 DIAGNOSIS — Z7982 Long term (current) use of aspirin: Secondary | ICD-10-CM | POA: Insufficient documentation

## 2019-12-25 DIAGNOSIS — Z794 Long term (current) use of insulin: Secondary | ICD-10-CM | POA: Diagnosis not present

## 2019-12-25 DIAGNOSIS — M549 Dorsalgia, unspecified: Secondary | ICD-10-CM | POA: Diagnosis not present

## 2019-12-25 MED ORDER — SIMETHICONE 40 MG/0.6ML PO SUSP (UNIT DOSE)
40.0000 mg | Freq: Once | ORAL | Status: AC
  Administered 2019-12-25: 40 mg via ORAL
  Filled 2019-12-25: qty 0.6

## 2019-12-25 MED ORDER — HYDROCODONE-ACETAMINOPHEN 5-325 MG PO TABS: 1.0000 | ORAL_TABLET | Freq: Four times a day (QID) | ORAL | 0 refills | Status: DC | PRN

## 2019-12-25 NOTE — ED Provider Notes (Signed)
[facility] DEPT Provider Note   CSN: [id number] Arrival date & time: 12/25/19  1111     History Chief Complaint  Patient presents with  . Back Pain    pancreatic cancer  . Medication Refill    Tamara Wood is a 74 y.o. female.  The history is provided by the patient, a relative and medical records. A language interpreter was used.  Back Pain Medication Refill  Tamara Wood is a 74 y.o. female who presents to the Emergency Department complaining of medication refill. She has a history of stage III pancreatic cancer, currently on palliative chemotherapy and presents the emergency department requesting a refill on her hydrocodone. She was prescribed a medication two weeks ago for her persistent abdominal pain. Her last dose was at 3 AM this morning. She states that she has been having abdominal pain with meals, slightly increased over the last few weeks. She does have daily vomiting and this is been going on for weeks. She has one episode of emesis daily. She is scheduled to follow-up with oncology next week. She contacted the office and they stated that she could not get a refill on her pain medication over the phone. She denies any fevers, diarrhea, dysuria. No recent sick contacts. She states that overall her symptoms are stable. She had labs performed three days ago with mild elevation in her LFTs, similar when compared to priors.    Past Medical History:  Diagnosis Date  . Diabetes mellitus without complication ([place])   . Hypertension   . Pancreatic cancer [facility])     Patient Active Problem List   Diagnosis Date Noted  . Port-A-Cath in place 12/16/2019  . Goals of care, counseling/discussion 11/30/2019  . Hypertension 11/30/2019  . DM (diabetes mellitus), type 2 ([place]) 11/30/2019  . Pancreatic cancer ([place]) 11/21/2019    Past Surgical History:  Procedure Laterality Date  . APPENDECTOMY    . BACK SURGERY    . CHOLECYSTECTOMY    . IR GASTROSTOMY  TUBE REMOVAL  11/30/2019  . IR IMAGING GUIDED PORT INSERTION  12/08/2019     OB History   No obstetric history on file.     Family History  Problem Relation Age of Onset  . Diabetes Mother   . Diabetes Sister   . Diabetes Brother     Social History   Tobacco Use  . Smoking status: Never Smoker  . Smokeless tobacco: Never Used  Substance Use Topics  . Alcohol use: Never  . Drug use: Never    Home Medications Prior to Admission medications   Medication Sig Start Date End Date Taking? Authorizing Provider  acetaminophen (TYLENOL) 500 MG tablet Take 500-1,000 mg by mouth daily as needed for moderate pain.   Yes [provider]  amLODipine (NORVASC) 5 MG tablet Take 1 tablet (5 mg total) by mouth daily. 12/22/19  Yes Erline Hau, MD  ASPIRIN LOW DOSE 81 MG EC tablet Take 81 mg by mouth daily. 07/31/19  Yes [provider]  Calcium Carb-Cholecalciferol (CALCIUM/VITAMIN D) 500-200 MG-UNIT TABS Take 1 tablet by mouth in the morning and at bedtime.  11/10/19  Yes [provider]  HYDROcodone-acetaminophen (NORCO) 5-325 MG tablet Take 1-2 tablets by mouth every 6 (six) hours as needed for moderate pain or severe pain. 12/16/19  Yes Alla Feeling, NP  lidocaine-prilocaine (EMLA) cream Apply 1 application topically as needed. 12/16/19  Yes Alla Feeling, NP  Magnesium 250 MG TABS Take 1 tablet (  250 mg total) by mouth daily. 12/07/19  Yes Isaac Bliss, Rayford Halsted, MD  metFORMIN (GLUCOPHAGE) 1000 MG tablet Take 1 tablet (1,000 mg total) by mouth 2 (two) times daily. 12/14/19  Yes Isaac Bliss, Rayford Halsted, MD  metoprolol tartrate (LOPRESSOR) 50 MG tablet Take 1 tablet (50 mg total) by mouth 2 (two) times daily. 12/07/19  Yes Isaac Bliss, Rayford Halsted, MD  ondansetron (ZOFRAN) 8 MG tablet Take 1 tablet (8 mg total) by mouth 2 (two) times daily as needed (Nausea or vomiting). 11/30/19  Yes Truitt Merle, MD  pantoprazole (PROTONIX) 20 MG tablet Take 1 tablet (20  mg total) by mouth daily. 11/22/19  Yes Truitt Merle, MD  potassium chloride SA (KLOR-CON) 20 MEQ tablet Take 1 tablet (20 mEq total) by mouth daily. 12/16/19  Yes Alla Feeling, NP  prochlorperazine (COMPAZINE) 10 MG tablet Take 1 tablet (10 mg total) by mouth every 6 (six) hours as needed (Nausea or vomiting). 11/30/19  Yes Truitt Merle, MD  vitamin B-12 (CYANOCOBALAMIN) 1000 MCG tablet Take 1 tablet (1,000 mcg total) by mouth daily. 11/22/19  Yes Truitt Merle, MD  glucose blood test strip 1 each by Other route daily. 12/14/19   Isaac Bliss, Rayford Halsted, MD  Lancets [facility] ULTRASOFT) lancets 1 each by Other route daily. 12/14/19   Isaac Bliss, Rayford Halsted, MD  traMADol (ULTRAM) 50 MG tablet Take 0.5-1 tablets (25-50 mg total) by mouth every 6 (six) hours as needed. Patient not taking: Reported on 12/25/2019 11/22/19   Truitt Merle, MD    Allergies    Patient has no known allergies.  Review of Systems   Review of Systems  Musculoskeletal: Positive for back pain.  All other systems reviewed and are negative.   Physical Exam Updated Vital Signs BP 105/80 (BP Location: Left Arm)   Pulse 93   Temp 98.2 F (36.8 C) (Oral)   Resp 18   SpO2 97%   Physical Exam Vitals and nursing note reviewed.  Constitutional:      Appearance: She is well-developed.  HENT:     Head: Normocephalic and atraumatic.  Cardiovascular:     Rate and Rhythm: Normal rate and regular rhythm.     Heart sounds: No murmur heard.   Pulmonary:     Effort: Pulmonary effort is normal. No respiratory distress.     Breath sounds: Normal breath sounds.  Abdominal:     Palpations: Abdomen is soft.     Tenderness: There is no abdominal tenderness. There is no guarding or rebound.  Musculoskeletal:        General: No tenderness.  Skin:    General: Skin is warm and dry.  Neurological:     Mental Status: She is alert and oriented to person, place, and time.  Psychiatric:        Behavior: Behavior normal.     ED Results /  Procedures / Treatments   Labs (all labs ordered are listed, but only abnormal results are displayed) Labs Reviewed - No data to display  EKG None  Radiology No results found.  Procedures Procedures (including critical care time)  Medications Ordered in ED Medications - No data to display  ED Course  I have reviewed the triage vital signs and the nursing notes.  Pertinent labs & imaging results that were available during my care of the patient were reviewed by me and considered in my medical decision making (see chart for details).    MDM Rules/Calculators/A&P  Patient with stage III pancreatic cancer on a palliative chemotherapy here requesting refill on her hydrocodone. P DMP database reviewed. Prescription was new and started on July 1. She appears to have appropriately used the medication. On examination she is non-toxic appearing with no significant abdominal tenderness. She declines pain medications during her ED stay. Discussed with patient if her pain is worsening recommend labs and further workup and patient declines at this time she had outpatient labs performed three days ago. Will provide prescription with close oncology follow-up. Return precautions discussed. Presentation is not consistent with bowel obstruction, perforation, sepsis.  Final Clinical Impression(s) / ED Diagnoses Final diagnoses:  Encounter for medication refill  Malignant neoplasm of pancreas, unspecified location of malignancy [facility])    Rx / DC Orders ED Discharge Orders    None       Quintella Reichert, MD 12/25/19 1330

## 2019-12-25 NOTE — Discharge Instructions (Addendum)
Please follow-up with oncology as scheduled. You can use Gas-[employer], available over-the-counter as needed for gas. Get rechecked immediately if you develop fevers, uncontrolled pain, inability to keep liquids down or new concerning symptoms.

## 2019-12-25 NOTE — ED Triage Notes (Signed)
Pt has pancreatic cancer and was given more pain medications on 7/1 by cancer doctor that was helping but now out and neither cancer doctor or PCP wont refill it. Hydrocodone-acetaminophen 5/325mg .

## 2019-12-29 NOTE — Progress Notes (Signed)
[place]   [phone] Fax:([phone]) [phone]   Clinic Follow up Note   Patient Care Team: Isaac Bliss, Rayford Halsted, MD as PCP - General (Internal Medicine) Jonnie Finner, RN as Oncology Nurse Navigator Truitt Merle, MD as Consulting Physician (Hematology)  Date of Service:  12/30/2019  CHIEF COMPLAINT: F/u of Pancreatic Cancer  SUMMARY OF ONCOLOGIC HISTORY: Oncology History Overview Note  Cancer Staging Pancreatic cancer [facility]) Staging form: Exocrine Pancreas, AJCC 8th Edition - Clinical: Stage III (cT4, cN1, cM0) - Signed by Truitt Merle, MD on 11/30/2019    Pancreatic cancer ([place])  06/04/2019 -  Hospital Admission   Admit date: 06/04/2019 Admission diagnosis: biliary obstruction  Additional comments: Patient presented with right upper quadrant abdominal pain, jaundice.  She was admitted to [facility] in [place].  She was noticed to have intrahepatic and extrahepatic biliary dilatation along with main pancreatic duct dilatation on CT 1 months prior.  MRCP showed a possible slight progression of the biliary and pancreatic duct dilatation.  Patient was evaluated by GI, underwent EGD.  Biopsy of the duodenum and increase did not review malignant cells, biopsy of pancreatic head showed chronic pancreatitis.  Patient underwent biliary drain placement by IR.  She was discharged home on June 12, 2019.   06/07/2019 Procedure   EUS with biopsy by Dr. Glade Stanford at [facility], [place].  Impression: Partial obstruction of the duodenal and the second portion in the expected area of the ampulla.  The overlying mucosa on this edematous folds which were causing the obstruction appeared normal with the exception of one area which was biopsied.    06/10/2019 Pathology Results   Common bile duct for cytology: Negative for malignant cells. Biopsy head of pancreas: chronic Pancreatitis   06/10/2019 Procedure   IR placed a biliary drain for  obstructive jaundice    07/30/2019 Imaging   CT abdomen and pelvis with contrast showed increased moderate distention of duodenal bulb without frank gastric distention.  Gallbladder absent, there is a stent in the low common bile duct without a significant dilatation.  There remains an external transhepatic biliary drain.  Increased to moderate dilatation of the pancreatic duct.  There is ill-defined fullness of the pancreatic head which is similar to prior exam.  The patient has underwent EUS aspiration and biliary stricture brushing without demonstration of tumor.  Pancreatic head the biopsy came back chronic pancreatitis.  Occult tumor remains possible.   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  Whipple procedure was planned but aborted because of him pancreatic mass was densely adherent to the mesentery vein.   11/21/2019 Initial Diagnosis   Pancreatic cancer ([place])   11/29/2019 Imaging   CT CAP W Contrast    IMPRESSION: 1. 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 2. Small lymph nodes in the retrocrural region on the LEFT largest 9 mm of uncertain significance. 3. Infiltration of the transverse mesocolon and extension of tumor into the root of the small bowel mesentery with abutment of SMV as well at the level of the first jejunal branch as described. 4. Profound hepatic steatosis. 5. Pneumobilia post stenting.  6. Signs of gastrojejunostomy and jejunostomy tube placement.   11/30/2019 Cancer Staging   Staging form: Exocrine  Pancreas, AJCC 8th Edition - Clinical: Stage III (cT4, cN1, cM0) - Signed by Truitt Merle, MD on 11/30/2019   12/08/2019 Procedure   She had PAC placed 12/08/19.    12/09/2019 -  Chemotherapy   Gemcitabine 2 weeks on/1 week off starting 12/09/19. Plan to add Abraxane to C2       CURRENT THERAPY:  Gemcitabine 2 weeks on/1 week off starting 12/09/19. Added Abraxane with C2 at 50% dose and decreased Gemcitabine by 20%.   INTERVAL HISTORY:  Tamara Wood is here for a follow up and treatment. She presents to the clinic with her daughter in law who helped translate for her. She notes had increased back pain and ran out of hydrocodone medications so she went to ED 12/25/19 who refilled #15 tabs of hydrocodone. She takes it q6hours and works well on her pain. She notes today having abdominal pain.  She notes her first cycle of chemo went well with occasional nausea. She was still able to eat adequately. She denies diarrhea. She did have fatigue and had weakness and anxiety for 2 days during week 2. Her energy was able to stay above 50%. She is able to keep food down but has lower appetite. She has lost weight from this.    REVIEW OF SYSTEMS:   Constitutional: Denies fevers, chills (+) Low appetite, weight loss (+) Fatigue  Eyes: Denies blurriness of vision Ears, nose, mouth, throat, and face: Denies mucositis or sore throat Respiratory: Denies cough, dyspnea or wheezes Cardiovascular: Denies palpitation, chest discomfort or lower extremity swelling Gastrointestinal:  Denies nausea, heartburn or change in bowel habits (+) Abdominal pain  Skin: Denies abnormal skin rashes MSK: (+) Back pain  Lymphatics: Denies new lymphadenopathy or easy bruising Neurological:Denies numbness, tingling or new weaknesses Behavioral/Psych: Mood is stable, no new changes  All other systems were reviewed with the patient and are negative.  MEDICAL HISTORY:  Past Medical History:  Diagnosis Date  . Diabetes mellitus  without complication ([place])   . Hypertension   . Pancreatic cancer [facility])     SURGICAL HISTORY: Past Surgical History:  Procedure Laterality Date  . APPENDECTOMY    . BACK SURGERY    . CHOLECYSTECTOMY    . IR GASTROSTOMY TUBE REMOVAL  11/30/2019  . IR IMAGING GUIDED PORT INSERTION  12/08/2019    I have reviewed the social history and family history with the patient and they are unchanged from previous note.  ALLERGIES:  has No Known Allergies.  MEDICATIONS:  Current Outpatient Medications  Medication Sig Dispense Refill  . acetaminophen (TYLENOL) 500 MG tablet Take 500-1,000 mg by mouth daily as needed for moderate pain.    Marland Kitchen amLODipine (NORVASC) 5 MG tablet Take 1 tablet (5 mg total) by mouth daily. 90 tablet 1  . ASPIRIN LOW DOSE 81 MG EC tablet Take 81 mg by mouth daily.    . Calcium Carb-Cholecalciferol (CALCIUM/VITAMIN D) 500-200 MG-UNIT TABS Take 1 tablet by mouth in the morning and at bedtime.     Marland Kitchen glucose blood test strip 1 each by Other route daily. 100 each 12  . HYDROcodone-acetaminophen (NORCO/VICODIN) 5-325 MG tablet Take 1 tablet by mouth every 6 (six) hours as needed. 90 tablet 0  . Lancets (ONETOUCH ULTRASOFT) lancets 1 each by Other route daily. 100 each 12  . lidocaine-prilocaine (EMLA) cream Apply 1 application topically as needed. 30 g 3  .  Magnesium 250 MG TABS Take 1 tablet (250 mg total) by mouth daily. 90 tablet 1  . metFORMIN (GLUCOPHAGE) 1000 MG tablet Take 1 tablet (1,000 mg total) by mouth 2 (two) times daily. 180 tablet 0  . metoprolol tartrate (LOPRESSOR) 50 MG tablet Take 1 tablet (50 mg total) by mouth 2 (two) times daily. 180 tablet 1  . ondansetron (ZOFRAN) 8 MG tablet Take 1 tablet (8 mg total) by mouth 2 (two) times daily as needed (Nausea or vomiting). 30 tablet 1  . pantoprazole (PROTONIX) 20 MG tablet Take 1 tablet (20 mg total) by mouth daily. 90 tablet 1  . potassium chloride SA (KLOR-CON) 20 MEQ tablet Take 1 tablet (20 mEq total) by mouth  daily. 30 tablet 1  . prochlorperazine (COMPAZINE) 10 MG tablet Take 1 tablet (10 mg total) by mouth every 6 (six) hours as needed (Nausea or vomiting). 30 tablet 1  . vitamin B-12 (CYANOCOBALAMIN) 1000 MCG tablet Take 1 tablet (1,000 mcg total) by mouth daily. 90 tablet 1  . traMADol (ULTRAM) 50 MG tablet Take 0.5-1 tablets (25-50 mg total) by mouth every 6 (six) hours as needed. (Patient not taking: Reported on 12/25/2019) 30 tablet 0   No current facility-administered medications for this visit.    PHYSICAL EXAMINATION: ECOG PERFORMANCE STATUS: 2  Vitals:   12/30/19 1232  BP: 120/71  Pulse: 65  Resp: 17  Temp: (!) 97.2 F (36.2 C)  SpO2: 100%   Filed Weights   12/30/19 1232  Weight: 122 lb 11.2 oz (55.7 kg)    Due to COVID19 we will limit examination to appearance. Patient had no complaints.  GENERAL:alert, no distress and comfortable SKIN: skin color normal, no rashes or significant lesions EYES: normal, Conjunctiva are pink and non-injected, sclera clear  NEURO: alert & oriented x 3 with fluent speech   LABORATORY DATA:  I have reviewed the data as listed CBC Latest Ref Rng & Units 12/30/2019 12/23/2019 12/16/2019  WBC 4.0 - 10.5 K/uL 3.4(L) 3.4(L) 3.6(L)  Hemoglobin 12.0 - 15.0 g/dL 10.0(L) 10.2(L) 10.8(L)  Hematocrit 36 - 46 % 30.6(L) 31.2(L) 32.2(L)  Platelets 150 - 400 K/uL 613(H) 190 277     CMP Latest Ref Rng & Units 12/30/2019 12/23/2019 12/16/2019  Glucose 70 - 99 mg/dL 237(H) 187(H) 216(H)  BUN 8 - 23 mg/dL 9 12 8   Creatinine 0.44 - 1.00 mg/dL 0.81 0.80 0.76  Sodium 135 - 145 mmol/L 138 138 139  Potassium 3.5 - 5.1 mmol/L 4.2 4.0 3.3(L)  Chloride 98 - 111 mmol/L 104 98 102  CO2 22 - 32 mmol/L 24 27 25   Calcium 8.9 - 10.3 mg/dL 8.2(L) 8.4(L) 7.7(L)  Total Protein 6.5 - 8.1 g/dL 6.4(L) 7.0 6.7  Total Bilirubin 0.3 - 1.2 mg/dL 0.6 0.5 0.6  Alkaline Phos 38 - 126 U/L 646(H) 952(H) 861(H)  AST 15 - 41 U/L 103(H) 117(H) 137(H)  ALT 0 - 44 U/L 51(H) 63(H) 87(H)       RADIOGRAPHIC STUDIES: I have personally reviewed the radiological images as listed and agreed with the findings in the report. No results found.   ASSESSMENT & PLAN:  Tamara Wood is a 74 y.o. female with    1. Pancreatic Cancer, stage III, unresectable -[place], her work up showed intrahepatic and extrahepatic biliary dilatation. EGDwith biopsy ofduodenal mucosa and head of pancreasshowed no cancer cells at that time. Sheunderwent biliary drain placement by IRin 05/2020 which was removed in 09/2019.  -She underwent biopsy  of pancreatic head mass on 08/10/19 which showedinvasive well differentiated adenocarcinoma,peripancreaticLN biopsy wasalso positive.  -She underwentWhipple Surgery on 08/29/19 but was aborted due to tumor invading the SMV. She instead hadgastrojejunostomy. Her tumor has invaded her mesentery and vascular structures around her pancreas, making her tumor unresectable. It is no longer curable in this case but still treatable to control her disease and prolong her life.  -I started her on first line chemotherapy with Gemcitabine 2 weeks on/1 week off on 12/09/19. Added Abraxane with C2.  -She had PAC placed 12/08/19.  -She tolerated first cycle chemo moderately well with a few days of fatigue, weakness, anxiety and mild nausea during week 2. She was able to recover well.  -Labs reviewed, WBC 3.4, Hg 10, plt 613K. Overall adequate to proceed with Gemcitabine today. I discussed adding Abraxane to her treatment for better disease control, but meantime it she will likely have more side effects. After lengthy discussion, pt agreed to try the combination of Gemcitabine (reduced 20%) and Abraxane (reduced 50%) for cycle 2 -If Gem/Abraxane is tolerable may increase dose, but if not I will stop Abraxane. I discussed if she loses too much weight I will hold chemo altogether as she will not be able to tolerate. She voiced good  understanding.  -She is a Jehovah Witness, which will limit how much chemo she can tolerate due to anemia issue  -F/u next week with NP Lacie     2. Malnutrition nutrition and weight loss  -During her 08/29/19 surgery her whipple was aborted and instead had bypass of duodenum and CBD due to blockages.She also had J-tube placed at that time. She did use this after surgery but has not used since she was discharged from rehab. J tube removed 11/30/19. -She feels she eats adequately by mouth now, but due to low appetite she is losing weight.  -I encouraged her to increase nutritional supplement every 1-2 hours. I will refer her to dietician. She is agreeable.   -Will monitor her nausea. Will consider dexamethasone for nausea and aid in appetite.    3. Comorbidities: HTN, DM, H/o of spinal tumor, Chronic back pain  -On Medication. She plans to see new PCP.  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 When she moved to [place] in 09/1989 she had back surgery and had it removed.  -Tylenol and Tramadol was no longer helping her pain. She was started on Norco (12/16/19) q6hours which is controlling her pain well. I refilled today and discussed this is a controlled substance that has to be monitored closely.  -She has had recent onset of abdominal pain which is likely related to her pancreatic cancer. Norco should help. She will also continue Prilosec.   4. Social Support  -She is originally from [place] She moved to [place] in 09/1989. She is married with 4 children.  -After medical issues since late 05/2019 her son and his wife who are fluent in [place] moved to [place]. After cancer diagnosis, all of them moved to [place], [place] in early May given 3 of her children live in [place]. She has great family support  -She is a Jehovah Witness  5. Goal of care discussion  -The patient understands the goal of care is palliative. She is full code for  now    PLAN: -I refilled Norco today  -Labs reviewed and adequate to proceed with C2D1 Gemcitabine (reduced 20%) and Abraxane (reduced 50%) today  -Lab, flush, f/u and  chemo in 1, 3, 4 weeks -will back off chemo if anemia gets worse, she is a Jehovah Witness  -Send dietician referral    No problem-specific Assessment & Plan notes found for this encounter.   No orders of the defined types were placed in this encounter.  All questions were answered. The patient knows to call the clinic with any problems, questions or concerns. No barriers to learning was detected. The total time spent in the appointment was 30 minutes.     Truitt Merle, MD 12/30/2019   I, Joslyn Devon, am acting as scribe for Truitt Merle, MD.   I have reviewed the above documentation for accuracy and completeness, and I agree with the above.

## 2019-12-30 ENCOUNTER — Inpatient Hospital Stay: Payer: 59

## 2019-12-30 ENCOUNTER — Inpatient Hospital Stay (HOSPITAL_BASED_OUTPATIENT_CLINIC_OR_DEPARTMENT_OTHER): Payer: 59 | Admitting: Hematology

## 2019-12-30 ENCOUNTER — Ambulatory Visit: Payer: Self-pay | Admitting: Internal Medicine

## 2019-12-30 ENCOUNTER — Encounter: Payer: Self-pay | Admitting: Hematology

## 2019-12-30 ENCOUNTER — Other Ambulatory Visit: Payer: Self-pay

## 2019-12-30 VITALS — BP 120/71 | HR 65 | Temp 97.2°F | Resp 17 | Ht 64.0 in | Wt 122.7 lb

## 2019-12-30 DIAGNOSIS — E119 Type 2 diabetes mellitus without complications: Secondary | ICD-10-CM

## 2019-12-30 DIAGNOSIS — C250 Malignant neoplasm of head of pancreas: Secondary | ICD-10-CM

## 2019-12-30 DIAGNOSIS — Z5111 Encounter for antineoplastic chemotherapy: Secondary | ICD-10-CM | POA: Diagnosis not present

## 2019-12-30 DIAGNOSIS — Z7189 Other specified counseling: Secondary | ICD-10-CM

## 2019-12-30 DIAGNOSIS — Z95828 Presence of other vascular implants and grafts: Secondary | ICD-10-CM

## 2019-12-30 LAB — CMP (CANCER CENTER ONLY)
ALT: 51 U/L — ABNORMAL HIGH (ref 0–44)
AST: 103 U/L — ABNORMAL HIGH (ref 15–41)
Albumin: 2.6 g/dL — ABNORMAL LOW (ref 3.5–5.0)
Alkaline Phosphatase: 646 U/L — ABNORMAL HIGH (ref 38–126)
Anion gap: 10 (ref 5–15)
BUN: 9 mg/dL (ref 8–23)
CO2: 24 mmol/L (ref 22–32)
Calcium: 8.2 mg/dL — ABNORMAL LOW (ref 8.9–10.3)
Chloride: 104 mmol/L (ref 98–111)
Creatinine: 0.81 mg/dL (ref 0.44–1.00)
GFR, Est AFR Am: >60 mL/min (ref >60)
GFR, Estimated: >60 mL/min (ref >60)
Glucose, Bld: 237 mg/dL — ABNORMAL HIGH (ref 70–99)
Potassium: 4.2 mmol/L (ref 3.5–5.1)
Sodium: 138 mmol/L (ref 135–145)
Total Bilirubin: 0.6 mg/dL (ref 0.3–1.2)
Total Protein: 6.4 g/dL — ABNORMAL LOW (ref 6.5–8.1)

## 2019-12-30 LAB — CBC WITH DIFFERENTIAL (CANCER CENTER ONLY)
Abs Immature Granulocytes: 0.05 10*3/uL (ref 0.00–0.07)
Basophils Absolute: 0 10*3/uL (ref 0.0–0.1)
Basophils Relative: 1 %
Eosinophils Absolute: 0 10*3/uL (ref 0.0–0.5)
Eosinophils Relative: 1 %
HCT: 30.6 % — ABNORMAL LOW (ref 36.0–46.0)
Hemoglobin: 10 g/dL — ABNORMAL LOW (ref 12.0–15.0)
Immature Granulocytes: 2 %
Lymphocytes Relative: 25 %
Lymphs Abs: 0.8 10*3/uL (ref 0.7–4.0)
MCH: 29.9 pg (ref 26.0–34.0)
MCHC: 32.7 g/dL (ref 30.0–36.0)
MCV: 91.3 fL (ref 80.0–100.0)
Monocytes Absolute: 0.3 10*3/uL (ref 0.1–1.0)
Monocytes Relative: 10 %
Neutro Abs: 2.1 10*3/uL (ref 1.7–7.7)
Neutrophils Relative %: 61 %
Platelet Count: 613 10*3/uL — ABNORMAL HIGH (ref 150–400)
RBC: 3.35 MIL/uL — ABNORMAL LOW (ref 3.87–5.11)
RDW: 14.5 % (ref 11.5–15.5)
WBC Count: 3.4 10*3/uL — ABNORMAL LOW (ref 4.0–10.5)
nRBC: 0 % (ref 0.0–0.2)

## 2019-12-30 MED ORDER — PROCHLORPERAZINE MALEATE 10 MG PO TABS
ORAL_TABLET | ORAL | Status: AC
  Filled 2019-12-30: qty 1

## 2019-12-30 MED ORDER — HYDROCODONE-ACETAMINOPHEN 5-325 MG PO TABS: 1.0000 | ORAL_TABLET | Freq: Four times a day (QID) | ORAL | 0 refills | Status: DC | PRN

## 2019-12-30 MED ORDER — PROCHLORPERAZINE MALEATE 10 MG PO TABS
10.0000 mg | ORAL_TABLET | Freq: Once | ORAL | Status: AC
  Administered 2019-12-30: 10 mg via ORAL

## 2019-12-30 MED ORDER — SODIUM CHLORIDE 0.9 % IV SOLN
Freq: Once | INTRAVENOUS | Status: AC
  Filled 2019-12-30: qty 250

## 2019-12-30 MED ORDER — PACLITAXEL PROTEIN-BOUND CHEMO INJECTION 100 MG
60.0000 mg/m2 | Freq: Once | INTRAVENOUS | Status: AC
  Administered 2019-12-30: 100 mg via INTRAVENOUS
  Filled 2019-12-30: qty 20

## 2019-12-30 MED ORDER — SODIUM CHLORIDE 0.9 % IV SOLN
800.0000 mg/m2 | Freq: Once | INTRAVENOUS | Status: AC
  Administered 2019-12-30: 1216 mg via INTRAVENOUS
  Filled 2019-12-30: qty 31.98

## 2019-12-30 MED ORDER — HEPARIN SOD (PORK) LOCK FLUSH 100 UNIT/ML IV SOLN
500.0000 [IU] | Freq: Once | INTRAVENOUS | Status: AC | PRN
  Administered 2019-12-30: 500 [IU]
  Filled 2019-12-30: qty 5

## 2019-12-30 MED ORDER — SODIUM CHLORIDE 0.9% FLUSH
10.0000 mL | INTRAVENOUS | Status: DC | PRN
  Administered 2019-12-30: 10 mL
  Filled 2019-12-30: qty 10

## 2019-12-30 MED ORDER — SODIUM CHLORIDE 0.9% FLUSH
10.0000 mL | Freq: Once | INTRAVENOUS | Status: AC
  Administered 2019-12-30: 10 mL
  Filled 2019-12-30: qty 10

## 2019-12-30 NOTE — Progress Notes (Signed)
Okay to treat with LFT's per Dr. Burr Medico.

## 2019-12-30 NOTE — Patient Instructions (Signed)
[facility] [facility] Discharge Instructions for Patients Receiving Chemotherapy  Today you received the following chemotherapy agents: Gemzar, abraxane  To help prevent nausea and vomiting after your treatment, we encourage you to take your nausea medication as directed.   If you develop nausea and vomiting that is not controlled by your nausea medication, call the clinic.   BELOW ARE SYMPTOMS THAT SHOULD BE REPORTED IMMEDIATELY:  *FEVER GREATER THAN 100.5 F  *CHILLS WITH OR WITHOUT FEVER  NAUSEA AND VOMITING THAT IS NOT CONTROLLED WITH YOUR NAUSEA MEDICATION  *UNUSUAL SHORTNESS OF BREATH  *UNUSUAL BRUISING OR BLEEDING  TENDERNESS IN MOUTH AND THROAT WITH OR WITHOUT PRESENCE OF ULCERS  *URINARY PROBLEMS  *BOWEL PROBLEMS  UNUSUAL RASH Items with * indicate a potential emergency and should be followed up as soon as possible.  Feel free to call the clinic should you have any questions or concerns. The clinic phone number is [phone].  Please show the [facility] at check-in to the Emergency Department and triage nurse.  Nanoparticle Albumin-Bound Paclitaxel injection What is this medicine? NANOPARTICLE ALBUMIN-BOUND PACLITAXEL (Na no PAHR ti kuhl al BYOO muhn-bound PAK li TAX el) is a chemotherapy drug. It targets fast dividing cells, like cancer cells, and causes these cells to die. This medicine is used to treat advanced breast cancer, lung cancer, and pancreatic cancer. This medicine may be used for other purposes; ask your health care provider or pharmacist if you have questions. COMMON BRAND NAME(S): Abraxane What should I tell my health care provider before I take this medicine? They need to know if you have any of these conditions:  kidney disease  liver disease  low blood counts, like low white cell, platelet, or red cell counts  lung or breathing disease, like asthma  tingling of the fingers or toes, or other nerve disorder  an unusual or  allergic reaction to paclitaxel, albumin, other chemotherapy, other medicines, foods, dyes, or preservatives  pregnant or trying to get pregnant  breast-feeding How should I use this medicine? This drug is given as an infusion into a vein. It is administered in a hospital or clinic by a specially trained health care professional. Talk to your pediatrician regarding the use of this medicine in children. Special care may be needed. Overdosage: If you think you have taken too much of this medicine contact a poison control center or emergency room at once. NOTE: This medicine is only for you. Do not share this medicine with others. What if I miss a dose? It is important not to miss your dose. Call your doctor or health care professional if you are unable to keep an appointment. What may interact with this medicine? This medicine may interact with the following medications:  antiviral medicines for hepatitis, HIV or AIDS  certain antibiotics like erythromycin and clarithromycin  certain medicines for fungal infections like ketoconazole and itraconazole  certain medicines for seizures like carbamazepine, phenobarbital, phenytoin  gemfibrozil  nefazodone  rifampin  St. John's wort This list may not describe all possible interactions. Give your health care provider a list of all the medicines, herbs, non-prescription drugs, or dietary supplements you use. Also tell them if you smoke, drink alcohol, or use illegal drugs. Some items may interact with your medicine. What should I watch for while using this medicine? Your condition will be monitored carefully while you are receiving this medicine. You will need important blood work done while you are taking this medicine. This medicine can cause serious  allergic reactions. If you experience allergic reactions like skin rash, itching or hives, swelling of the face, lips, or tongue, tell your doctor or health care professional right away. In some  cases, you may be given additional medicines to help with side effects. Follow all directions for their use. This drug may make you feel generally unwell. This is not uncommon, as chemotherapy can affect healthy cells as well as cancer cells. Report any side effects. Continue your course of treatment even though you feel ill unless your doctor tells you to stop. Call your doctor or health care professional for advice if you get a fever, chills or sore throat, or other symptoms of a cold or flu. Do not treat yourself. This drug decreases your body's ability to fight infections. Try to avoid being around people who are sick. This medicine may increase your risk to bruise or bleed. Call your doctor or health care professional if you notice any unusual bleeding. Be careful brushing and flossing your teeth or using a toothpick because you may get an infection or bleed more easily. If you have any dental work done, tell your dentist you are receiving this medicine. Avoid taking products that contain aspirin, acetaminophen, ibuprofen, naproxen, or ketoprofen unless instructed by your doctor. These medicines may hide a fever. Do not become pregnant while taking this medicine or for 6 months after stopping it. Women should inform their doctor if they wish to become pregnant or think they might be pregnant. Men should not father a child while taking this medicine or for 3 months after stopping it. There is a potential for serious side effects to an unborn child. Talk to your health care professional or pharmacist for more information. Do not breast-feed an infant while taking this medicine or for 2 weeks after stopping it. This medicine may interfere with the ability to get pregnant or to father a child. You should talk to your doctor or health care professional if you are concerned about your fertility. What side effects may I notice from receiving this medicine? Side effects that you should report to your doctor  or health care professional as soon as possible:  allergic reactions like skin rash, itching or hives, swelling of the face, lips, or tongue  breathing problems  changes in vision  fast, irregular heartbeat  low blood pressure  mouth sores  pain, tingling, numbness in the hands or feet  signs of decreased platelets or bleeding - bruising, pinpoint red spots on the skin, black, tarry stools, blood in the urine  signs of decreased red blood cells - unusually weak or tired, feeling faint or lightheaded, falls  signs of infection - fever or chills, cough, sore throat, pain or difficulty passing urine  signs and symptoms of liver injury like dark yellow or brown urine; general ill feeling or flu-like symptoms; light-colored stools; loss of appetite; nausea; right upper belly pain; unusually weak or tired; yellowing of the eyes or skin  swelling of the ankles, feet, hands  unusually slow heartbeat Side effects that usually do not require medical attention (report to your doctor or health care professional if they continue or are bothersome):  diarrhea  hair loss  loss of appetite  nausea, vomiting  tiredness This list may not describe all possible side effects. Call your doctor for medical advice about side effects. You may report side effects to FDA at 1-800-FDA-1088. Where should I keep my medicine? This drug is given in a hospital or clinic and will  not be stored at home. NOTE: This sheet is a summary. It may not cover all possible information. If you have questions about this medicine, talk to your doctor, pharmacist, or health care provider.  2020 Elsevier/Gold Standard (2017-02-04 13:03:45)

## 2019-12-31 ENCOUNTER — Telehealth: Payer: Self-pay | Admitting: Hematology

## 2019-12-31 NOTE — Telephone Encounter (Signed)
No 7/15 los.

## 2020-01-04 ENCOUNTER — Emergency Department (HOSPITAL_COMMUNITY): Payer: 59

## 2020-01-04 ENCOUNTER — Other Ambulatory Visit: Payer: Self-pay

## 2020-01-04 ENCOUNTER — Emergency Department (HOSPITAL_COMMUNITY)
Admission: EM | Admit: 2020-01-04 | Discharge: 2020-01-04 | Disposition: A | Discharge status: Home / Self Care | Payer: 59 | Attending: Emergency Medicine | Admitting: Emergency Medicine

## 2020-01-04 DIAGNOSIS — M25572 Pain in left ankle and joints of left foot: Secondary | ICD-10-CM | POA: Diagnosis not present

## 2020-01-04 DIAGNOSIS — Y939 Activity, unspecified: Secondary | ICD-10-CM | POA: Insufficient documentation

## 2020-01-04 DIAGNOSIS — Y999 Unspecified external cause status: Secondary | ICD-10-CM | POA: Insufficient documentation

## 2020-01-04 DIAGNOSIS — Y929 Unspecified place or not applicable: Secondary | ICD-10-CM | POA: Diagnosis not present

## 2020-01-04 DIAGNOSIS — W19XXXA Unspecified fall, initial encounter: Secondary | ICD-10-CM | POA: Insufficient documentation

## 2020-01-04 DIAGNOSIS — Z5321 Procedure and treatment not carried out due to patient leaving prior to being seen by health care provider: Secondary | ICD-10-CM | POA: Insufficient documentation

## 2020-01-04 NOTE — Progress Notes (Addendum)
[facility]   [phone] [phone]) [phone]   Clinic Follow up Note   Patient Care Team: Isaac Bliss, Rayford Halsted, MD as PCP - General (Internal Medicine) Jonnie Finner, RN as Oncology Nurse Navigator Truitt Merle, MD as Consulting Physician (Hematology) Date of Service: 01/06/2020   CHIEF COMPLAINT: Follow-up pancreas cancer  SUMMARY OF ONCOLOGIC HISTORY: Oncology History Overview Note  Cancer Staging Pancreatic cancer [facility]) Staging form: Exocrine Pancreas, AJCC 8th Edition - Clinical: Stage III (cT4, cN1, cM0) - Signed by Truitt Merle, MD on 11/30/2019    Pancreatic cancer ([place])  06/04/2019 -  Hospital Admission   Admit date: 06/04/2019 Admission diagnosis: biliary obstruction  Additional comments: Patient presented with right upper quadrant abdominal pain, jaundice.  She was admitted to [facility] in [place].  She was noticed to have intrahepatic and extrahepatic biliary dilatation along with main pancreatic duct dilatation on CT 1 months prior.  MRCP showed a possible slight progression of the biliary and pancreatic duct dilatation.  Patient was evaluated by GI, underwent EGD.  Biopsy of the duodenum and increase did not review malignant cells, biopsy of pancreatic head showed chronic pancreatitis.  Patient underwent biliary drain placement by IR.  She was discharged home on June 12, 2019.   06/07/2019 Procedure   EUS with biopsy by Dr. Glade Stanford at [facility], [place].  Impression: Partial obstruction of the duodenal and the second portion in the expected area of the ampulla.  The overlying mucosa on this edematous folds which were causing the obstruction appeared normal with the exception of one area which was biopsied.    06/10/2019 Pathology Results   Common bile duct for cytology: Negative for malignant cells. Biopsy head of pancreas: chronic Pancreatitis   06/10/2019 Procedure   IR placed a biliary drain for  obstructive jaundice    07/30/2019 Imaging   CT abdomen and pelvis with contrast showed increased moderate distention of duodenal bulb without frank gastric distention.  Gallbladder absent, there is a stent in the low common bile duct without a significant dilatation.  There remains an external transhepatic biliary drain.  Increased to moderate dilatation of the pancreatic duct.  There is ill-defined fullness of the pancreatic head which is similar to prior exam.  The patient has underwent EUS aspiration and biliary stricture brushing without demonstration of tumor.  Pancreatic head the biopsy came back chronic pancreatitis.  Occult tumor remains possible.   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  Whipple procedure was planned but aborted because of him pancreatic mass was densely adherent to the mesentery vein.   11/21/2019 Initial Diagnosis   Pancreatic cancer ([place])   11/29/2019 Imaging   CT CAP W Contrast    IMPRESSION: 1. 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 2. Small lymph nodes in the retrocrural region on the LEFT largest 9 mm of uncertain significance. 3. Infiltration of the transverse mesocolon and extension of tumor into the root of the small bowel mesentery with abutment of SMV as well at the level of the first jejunal branch as described. 4. Profound hepatic steatosis. 5. Pneumobilia post stenting. 6. Signs  of gastrojejunostomy and jejunostomy tube placement.   11/30/2019 Cancer Staging   Staging form: Exocrine  Pancreas, AJCC 8th Edition - Clinical: Stage III (cT4, cN1, cM0) - Signed by Truitt Merle, MD on 11/30/2019   12/08/2019 Procedure   She had PAC placed 12/08/19.    12/09/2019 -  Chemotherapy   Gemcitabine 2 weeks on/1 week off starting 12/09/19. Plan to add Abraxane to C2      CURRENT THERAPY:  Gemcitabine 2 weeks on/1 week off starting 12/09/19. Added Abraxane with C2 at 50% dose and decreased Gemcitabine by 20%.   INTERVAL HISTORY: Tamara Wood returns for follow-up and treatment as scheduled.  She completed cycle 2-day 1 dose reduced gem/Abraxane on 7/15.  She felt a little weaker than baseline but not much more.  Remained able to be out of bed and up at home for ADLs.  Appetite is increased, weight is stable.  Her abdominal and right flank pain improved significantly, able to go 10 hours between pain meds.  She does have intermittent low back pain that "shoots up" but had this previously.  She fell on 7/20 when she tripped getting out of a chair after having been on her tablet.  She could not reach a rail to steady herself, fell on left ankle and bruised it.  She can bear weight. Did not hit her head or lose consciousness.  While resting on the floor after fall she had n/v episode x1, otherwise denies N/V/C/D. Went to ED, x-rays negative.   She recovered from the incident. Had one episode of cold chill then sweats, did not measure temp. Denies mucositis, bleeding, dysuria, cough, chest pain, dyspnea, neuropathy, rash.     MEDICAL HISTORY:  Past Medical History:  Diagnosis Date  . Diabetes mellitus without complication ([place])   . Hypertension   . Pancreatic cancer [facility])     SURGICAL HISTORY: Past Surgical History:  Procedure Laterality Date  . APPENDECTOMY    . BACK SURGERY    . CHOLECYSTECTOMY    . IR GASTROSTOMY TUBE REMOVAL  11/30/2019  . IR IMAGING GUIDED PORT INSERTION  12/08/2019    I have reviewed the social history and family history with the patient and they are unchanged from  previous note.  ALLERGIES:  has No Known Allergies.  MEDICATIONS:  Current Outpatient Medications  Medication Sig Dispense Refill  . acetaminophen (TYLENOL) 500 MG tablet Take 500-1,000 mg by mouth daily as needed for moderate pain.    Marland Kitchen amLODipine (NORVASC) 5 MG tablet Take 1 tablet (5 mg total) by mouth daily. 90 tablet 1  . ASPIRIN LOW DOSE 81 MG EC tablet Take 81 mg by mouth daily.    . Calcium Carb-Cholecalciferol (CALCIUM/VITAMIN D) 500-200 MG-UNIT TABS Take 1 tablet by mouth in the morning and at bedtime.     Marland Kitchen glucose blood test strip 1 each by Other route daily. 100 each 12  . HYDROcodone-acetaminophen (NORCO/VICODIN) 5-325 MG tablet Take 1 tablet by mouth every 6 (six) hours as needed. 90 tablet 0  . Lancets (ONETOUCH ULTRASOFT) lancets 1 each by Other route daily. 100 each 12  . lidocaine-prilocaine (EMLA) cream Apply 1 application topically as needed. 30 g 3  . Magnesium 250 MG TABS Take 1 tablet (250 mg total) by mouth daily. 90 tablet 1  . metFORMIN (GLUCOPHAGE) 1000 MG tablet Take 1 tablet (1,000 mg total) by mouth 2 (two) times daily. 180 tablet 0  . metoprolol tartrate (LOPRESSOR) 50 MG tablet Take 1 tablet (50 mg  total) by mouth 2 (two) times daily. 180 tablet 1  . ondansetron (ZOFRAN) 8 MG tablet Take 1 tablet (8 mg total) by mouth 2 (two) times daily as needed (Nausea or vomiting). 30 tablet 1  . pantoprazole (PROTONIX) 20 MG tablet Take 1 tablet (20 mg total) by mouth daily. 90 tablet 1  . potassium chloride SA (KLOR-CON) 20 MEQ tablet Take 1 tablet (20 mEq total) by mouth daily. 30 tablet 1  . prochlorperazine (COMPAZINE) 10 MG tablet Take 1 tablet (10 mg total) by mouth every 6 (six) hours as needed (Nausea or vomiting). 30 tablet 1  . vitamin B-12 (CYANOCOBALAMIN) 1000 MCG tablet Take 1 tablet (1,000 mcg total) by mouth daily. 90 tablet 1  . traMADol (ULTRAM) 50 MG tablet Take 0.5-1 tablets (25-50 mg total) by mouth every 6 (six) hours as needed. (Patient not taking:  Reported on 12/25/2019) 30 tablet 0   Current Facility-Administered Medications  Medication Dose Route Frequency Provider Last Rate Last Admin  . sodium chloride flush (NS) 0.9 % injection 10 mL  10 mL Intravenous PRN Tamara Feeling, NP   10 mL at 01/06/20 1242    PHYSICAL EXAMINATION: ECOG PERFORMANCE STATUS: 1 - Symptomatic but completely ambulatory  Vitals:   01/06/20 1141  BP: 120/68  Pulse: 62  Resp: 18  Temp: 97.8 F (36.6 C)  SpO2: 100%   Filed Weights   01/06/20 1141  Weight: 122 lb 14.4 oz (55.7 kg)    GENERAL:alert, no distress and comfortable SKIN: No rash to exposed skin EYES:  sclera clear LUNGS: clear with normal breathing effort HEART: regular rate & rhythm, no lower extremity edema ABDOMEN:abdomen soft, non-tender and normal bowel sounds Musculoskeletal: Soft tissue swelling with ecchymosis to the left anteriolateral foot with mild tenderness.  No focal spinal tenderness NEURO: alert & oriented x 3 with fluent speech. PAC without erythema  LABORATORY DATA:  I have reviewed the data as listed CBC Latest Ref Rng & Units 01/06/2020 12/30/2019 12/23/2019  WBC 4.0 - 10.5 K/uL 3.2(L) 3.4(L) 3.4(L)  Hemoglobin 12.0 - 15.0 g/dL 8.2(L) 10.0(L) 10.2(L)  Hematocrit 36 - 46 % 25.4(L) 30.6(L) 31.2(L)  Platelets 150 - 400 K/uL 354 613(H) 190     CMP Latest Ref Rng & Units 01/06/2020 12/30/2019 12/23/2019  Glucose 70 - 99 mg/dL 242(H) 237(H) 187(H)  BUN 8 - 23 mg/dL 10 9 12   Creatinine 0.44 - 1.00 mg/dL 0.75 0.81 0.80  Sodium 135 - 145 mmol/L 137 138 138  Potassium 3.5 - 5.1 mmol/L 4.2 4.2 4.0  Chloride 98 - 111 mmol/L 103 104 98  CO2 22 - 32 mmol/L 25 24 27   Calcium 8.9 - 10.3 mg/dL 8.8(L) 8.2(L) 8.4(L)  Total Protein 6.5 - 8.1 g/dL 6.3(L) 6.4(L) 7.0  Total Bilirubin 0.3 - 1.2 mg/dL 0.6 0.6 0.5  Alkaline Phos 38 - 126 U/L 718(H) 646(H) 952(H)  AST 15 - 41 U/L 95(H) 103(H) 117(H)  ALT 0 - 44 U/L 51(H) 51(H) 63(H)      RADIOGRAPHIC STUDIES: I have personally  reviewed the radiological images as listed and agreed with the findings in the report. No results found.   ASSESSMENT & PLAN: Tamara Almonteis a 74 y.o.femalewith    1. Pancreatic Cancer,stage III,unresectable -She initially presented to hospital in [place] with RUQ abdominal and jaundice. Her work up showed intrahepatic and extrahepatic biliary dilatation. EGDwith biopsy ofduodenal mucosa and head of pancreasshowed no cancer cells at that time. Sheunderwent biliary drain placement by IRin 05/2020 which was  removed in 09/2019.  -She underwent biopsy of pancreatic head mass on 08/10/19 which showedinvasive well differentiated adenocarcinoma,peripancreaticLN biopsy wasalso positive.  -She underwentWhipple Surgery on 08/29/19 but was aborted due to tumor invading the SMV. She instead hadgastrojejunostomy and J-tube placementdue tobowelblockages. -Her tumor has invaded her mesentery and vascular structures around her pancreas, making her tumor unresectable. It is no longer curable in this case but still treatable to controlher disease and prolong her life.  -CT CAP from 11/29/19 showsno evidence of distant metastasis, butagain demonstrated locally advanced disease. Her baseline Ca 19.9 was elevated at 911 which is concerning for high tumor burden and developing distant metastasis.  -Given her age, Dr. Burr Medico recommended (and agreed upon by multidisciplinary team) systemic chemo with Gemcitabinewith or without Abraxane2 weeks on/1 week off for 3-6 months followed by consolidation Radiation.  -goal is palliative -began single agent gemcitbaine on 01/08/20, she tolerated well with mild but well controlled nausea and fatigue/weakness. She recovered well and 50% dose-reduced abraxane was added with C2.  -Ms. Chelf appears stable. She tolerated gem/abraxane well overall except increased weakness. She was able to recover well.  -Her right back/flank pain improved significantly after adding  abraxane, she has reduced pain med requirements. This indicates a clinical response to gem/abraxane. -Unfortunately she has progressive anemia today Hgb 8.2 with mild neutropenia ANC 1.4. given she is a [employer]'s Witness, we discussed the risks of proceeding with additional chemotherapy.  -The patient was seen with Dr. Burr Medico. We discussed holding chemotherapy altogether until CBC improves vs proceeding with single agent low dose chemo vs proceeding with dose-reduced gem/abraxane with growth factors.  -She does not want to stop chemo but also will not accept blood products.  -We discussed adding EPO/retacrit and reviewed risk/benefit. She understands this is a growth factor and may cause tumor growth. She is willing to proceed.  -The plan is to hold chemo today; when she returns next week she will proceed with further dose-reduced gem/abraxane and change to q2 weeks, and epo with each chemo. Will add on Udenyca if needed. Potential side effects were discussed. She agrees with the plan. -F/u next week before treatment.   2. S/p fall 01/04/20, left foot/ankle injury Tripped getting up from chair, no LOC  -Xray in ED negative -mild swelling and bruising, can bear weight. Continue RICE  3. Nutrition  -During her 08/29/19 surgery her whipple was aborted and instead had bypass of duodenum and CBD due to blockages.She also had J-tube placed at that time.  -J tube removed 6/15 -using supplements, weight stabilized lately   4. Comorbidities: HTN, DM, H/o of spinal tumor, Chronic back pain  -followed by PCP Dr. 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 When she moved to [place] in 09/1989 she had back surgery and had it removed.  -She has chronic low back pain; her cancer related right back/flank pain was not well managed with tramadol but improved with norco which she required q6.  -Since adding abraxane, her pain improved, now goes 10  hours between doses; clinically, she is likely responding to treatment.   5. Social Support  -She is originally from [place] She moved to [place] in 09/1989. She is married with 4 children.  -After medical issues since late 05/2019 her son and his wife who are fluent in [place] moved to [place]. After cancer diagnosis, all of them moved to [place], [place] in early May given 3 of her children live in [place]. She  has great family support  -She is a Jehovah Witness, does not accept blood products   6. B/l leg pain  -doppler negative for DVT  7.Goal of care discussion  -The goal of care is palliative. -she is full code for now  PLAN: -labs reviewed -Hold chemo -Return next week for lab, f/u, gem/abraxane, epo -Add udenyca if ANC does not recover  -f/u q2 weeks with chemo and epo   All questions were answered. The patient knows to call the clinic with any problems, questions or concerns. No barriers to learning were detected.     Tamara Feeling, NP 01/07/20   Addendum  I have seen the patient, examined her. I agree with the assessment and and plan and have edited the notes.   Pt's abdominal pain improved after last cycle chemo with gem and Abraxane, which is clinically encouraging.  Her tumor marker has significantly decreased since she started chemo, which indicating good response to treatment.  However she has developed moderate anemia, she is a Jehovah witness, will not agree with blood transfusion under any circumstance.  We discussed the option of stopping chemo, or reduce chemo dose and frequency, and add EPO for her chemo induced anemia.  Benefit and potential of EPO treatment discussed with patient, especially the small risk of tumor growth and risk of thrombosis from EPO.  After lengthy discussion, patient would like to continue chemo, and agrees with EPO, and GCSF if needed, will submit for insurance approval. Will hold on chemo today and restart next week with dose  reduction by 50%, gemcitabine 500 mg/m, and Abraxane 60 mg/m, every 2 weeks. She agrees with the plan, questions were answered.   Truitt Merle  01/06/2020

## 2020-01-04 NOTE — ED Triage Notes (Signed)
Patient is a cancer patient who had a fall yesterday. Patient's left ankle/foot is blue and swollen.

## 2020-01-04 NOTE — ED Notes (Signed)
Pt eloped from waiting area. Called 3X.  

## 2020-01-06 ENCOUNTER — Inpatient Hospital Stay: Payer: 59

## 2020-01-06 ENCOUNTER — Inpatient Hospital Stay (HOSPITAL_BASED_OUTPATIENT_CLINIC_OR_DEPARTMENT_OTHER): Payer: 59 | Admitting: Nurse Practitioner

## 2020-01-06 ENCOUNTER — Other Ambulatory Visit: Payer: Self-pay

## 2020-01-06 ENCOUNTER — Inpatient Hospital Stay: Payer: 59 | Admitting: Nutrition

## 2020-01-06 ENCOUNTER — Telehealth: Payer: Self-pay | Admitting: Nurse Practitioner

## 2020-01-06 VITALS — BP 120/68 | HR 62 | Temp 97.8°F | Resp 18 | Ht 64.0 in | Wt 122.9 lb

## 2020-01-06 DIAGNOSIS — C250 Malignant neoplasm of head of pancreas: Secondary | ICD-10-CM

## 2020-01-06 DIAGNOSIS — Z95828 Presence of other vascular implants and grafts: Secondary | ICD-10-CM

## 2020-01-06 DIAGNOSIS — Z5111 Encounter for antineoplastic chemotherapy: Secondary | ICD-10-CM | POA: Diagnosis not present

## 2020-01-06 LAB — CBC WITH DIFFERENTIAL (CANCER CENTER ONLY)
Abs Immature Granulocytes: 0.32 10*3/uL — ABNORMAL HIGH (ref 0.00–0.07)
Basophils Absolute: 0.1 10*3/uL (ref 0.0–0.1)
Basophils Relative: 3 %
Eosinophils Absolute: 0 10*3/uL (ref 0.0–0.5)
Eosinophils Relative: 1 %
HCT: 25.4 % — ABNORMAL LOW (ref 36.0–46.0)
Hemoglobin: 8.2 g/dL — ABNORMAL LOW (ref 12.0–15.0)
Immature Granulocytes: 10 %
Lymphocytes Relative: 31 %
Lymphs Abs: 1 10*3/uL (ref 0.7–4.0)
MCH: 29.7 pg (ref 26.0–34.0)
MCHC: 32.3 g/dL (ref 30.0–36.0)
MCV: 92 fL (ref 80.0–100.0)
Monocytes Absolute: 0.4 10*3/uL (ref 0.1–1.0)
Monocytes Relative: 11 %
Neutro Abs: 1.4 10*3/uL — ABNORMAL LOW (ref 1.7–7.7)
Neutrophils Relative %: 44 %
Platelet Count: 354 10*3/uL (ref 150–400)
RBC: 2.76 MIL/uL — ABNORMAL LOW (ref 3.87–5.11)
RDW: 14.9 % (ref 11.5–15.5)
WBC Count: 3.2 10*3/uL — ABNORMAL LOW (ref 4.0–10.5)
nRBC: 0 % (ref 0.0–0.2)

## 2020-01-06 LAB — CMP (CANCER CENTER ONLY)
ALT: 51 U/L — ABNORMAL HIGH (ref 0–44)
AST: 95 U/L — ABNORMAL HIGH (ref 15–41)
Albumin: 2.3 g/dL — ABNORMAL LOW (ref 3.5–5.0)
Alkaline Phosphatase: 718 U/L — ABNORMAL HIGH (ref 38–126)
Anion gap: 9 (ref 5–15)
BUN: 10 mg/dL (ref 8–23)
CO2: 25 mmol/L (ref 22–32)
Calcium: 8.8 mg/dL — ABNORMAL LOW (ref 8.9–10.3)
Chloride: 103 mmol/L (ref 98–111)
Creatinine: 0.75 mg/dL (ref 0.44–1.00)
GFR, Est AFR Am: >60 mL/min (ref >60)
GFR, Estimated: >60 mL/min (ref >60)
Glucose, Bld: 242 mg/dL — ABNORMAL HIGH (ref 70–99)
Potassium: 4.2 mmol/L (ref 3.5–5.1)
Sodium: 137 mmol/L (ref 135–145)
Total Bilirubin: 0.6 mg/dL (ref 0.3–1.2)
Total Protein: 6.3 g/dL — ABNORMAL LOW (ref 6.5–8.1)

## 2020-01-06 MED ORDER — HEPARIN SOD (PORK) LOCK FLUSH 100 UNIT/ML IV SOLN
500.0000 [IU] | Freq: Once | INTRAVENOUS | Status: AC
  Administered 2020-01-06: 500 [IU] via INTRAVENOUS
  Filled 2020-01-06: qty 5

## 2020-01-06 MED ORDER — SODIUM CHLORIDE 0.9% FLUSH
10.0000 mL | INTRAVENOUS | Status: DC | PRN
  Administered 2020-01-06: 10 mL via INTRAVENOUS
  Filled 2020-01-06: qty 10

## 2020-01-06 MED ORDER — SODIUM CHLORIDE 0.9% FLUSH
10.0000 mL | Freq: Once | INTRAVENOUS | Status: AC
  Administered 2020-01-06: 10 mL
  Filled 2020-01-06: qty 10

## 2020-01-06 NOTE — Patient Instructions (Signed)

## 2020-01-06 NOTE — Telephone Encounter (Signed)
Scheduled per 7/22 los. Spoke with pt's daughter and is aware of appt times and dates.

## 2020-01-07 ENCOUNTER — Encounter: Payer: Self-pay | Admitting: Nurse Practitioner

## 2020-01-07 LAB — CANCER ANTIGEN 19-9: CA 19-9: 670 U/mL — ABNORMAL HIGH (ref 0–35)

## 2020-01-11 NOTE — Progress Notes (Signed)
[place]   [phone] [phone]   Clinic Follow up Note   Patient Care Team: Isaac Bliss, Rayford Halsted, MD as PCP - General (Internal Medicine) Jonnie Finner, RN as Oncology Nurse Navigator Truitt Merle, MD as Consulting Physician (Hematology) 01/12/2020  CHIEF COMPLAINT: F/u pancreas cancer   SUMMARY OF ONCOLOGIC HISTORY: Oncology History Overview Note  Cancer Staging Pancreatic cancer [facility]) Staging form: Exocrine Pancreas, AJCC 8th Edition - Clinical: Stage III (cT4, cN1, cM0) - Signed by Truitt Merle, MD on 11/30/2019    Pancreatic cancer ([place])  06/04/2019 -  Hospital Admission   Admit date: 06/04/2019 Admission diagnosis: biliary obstruction  Additional comments: Patient presented with right upper quadrant abdominal pain, jaundice.  She was admitted to [facility] in [place].  She was noticed to have intrahepatic and extrahepatic biliary dilatation along with main pancreatic duct dilatation on CT 1 months prior.  MRCP showed a possible slight progression of the biliary and pancreatic duct dilatation.  Patient was evaluated by GI, underwent EGD.  Biopsy of the duodenum and increase did not review malignant cells, biopsy of pancreatic head showed chronic pancreatitis.  Patient underwent biliary drain placement by IR.  She was discharged home on June 12, 2019.   06/07/2019 Procedure   EUS with biopsy by Dr. Glade Stanford at [facility], [place].  Impression: Partial obstruction of the duodenal and the second portion in the expected area of the ampulla.  The overlying mucosa on this edematous folds which were causing the obstruction appeared normal with the exception of one area which was biopsied.    06/10/2019 Pathology Results   Common bile duct for cytology: Negative for malignant cells. Biopsy head of pancreas: chronic Pancreatitis   06/10/2019 Procedure   IR placed a biliary drain for obstructive jaundice      07/30/2019 Imaging   CT abdomen and pelvis with contrast showed increased moderate distention of duodenal bulb without frank gastric distention.  Gallbladder absent, there is a stent in the low common bile duct without a significant dilatation.  There remains an external transhepatic biliary drain.  Increased to moderate dilatation of the pancreatic duct.  There is ill-defined fullness of the pancreatic head which is similar to prior exam.  The patient has underwent EUS aspiration and biliary stricture brushing without demonstration of tumor.  Pancreatic head the biopsy came back chronic pancreatitis.  Occult tumor remains possible.   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  Whipple procedure was planned but aborted because of him pancreatic mass was densely adherent to the mesentery vein.   11/21/2019 Initial Diagnosis   Pancreatic cancer ([place])   11/29/2019 Imaging   CT CAP W Contrast    IMPRESSION: 1. 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 2. Small lymph nodes in the retrocrural region on the LEFT largest 9 mm of uncertain significance. 3. Infiltration of the transverse mesocolon and extension of tumor into the root of the small bowel mesentery with abutment of SMV as well at the level of the first jejunal branch as described. 4. Profound hepatic steatosis. 5. Pneumobilia post stenting. 6. Signs of gastrojejunostomy  and jejunostomy tube placement.   11/30/2019 Cancer Staging   Staging form: Exocrine Pancreas, AJCC 8th  Edition - Clinical: Stage III (cT4, cN1, cM0) - Signed by Truitt Merle, MD on 11/30/2019   12/08/2019 Procedure   She had PAC placed 12/08/19.    12/09/2019 -  Chemotherapy   Gemcitabine 2 weeks on/1 week off starting 12/09/19. Plan to add Abraxane to C2      CURRENT THERAPY:  Gemcitabine 2 weeks on/1 week offstarting 12/09/19.Added Abraxane with C2 at 50% dose and decreased Gemcitabine by 20%. She developed worsening anemia after C2D1, treatment changed to every 2 weeks with retacrit   INTERVAL HISTORY: Ms. Heider returns for follow-up and treatment as scheduled.  Cycle 2-day 8 gem/Abraxane was held last week due to worsening anemia.  She feels well today, no changes since last visit.  Her pain is minimal overall, does not require Norco.  Energy and appetite are adequate.  She had an episode of abdominal pain and diarrhea yesterday after eating cheese on Sunday, she is lactose intolerant.  Denies new fever, chills, cough, chest pain, dyspnea.  The left ankle continues to heal.  She holds her metoprolol periodically for low blood pressure.   MEDICAL HISTORY:  Past Medical History:  Diagnosis Date   Diabetes mellitus without complication ([place])    Hypertension    Pancreatic cancer ([place])     SURGICAL HISTORY: Past Surgical History:  Procedure Laterality Date   APPENDECTOMY     BACK SURGERY     CHOLECYSTECTOMY     IR GASTROSTOMY TUBE REMOVAL  11/30/2019   IR IMAGING GUIDED PORT INSERTION  12/08/2019    I have reviewed the social history and family history with the patient and they are unchanged from previous note.  ALLERGIES:  has No Known Allergies.  MEDICATIONS:  Current Outpatient Medications  Medication Sig Dispense Refill   acetaminophen (TYLENOL) 500 MG tablet Take 500-1,000 mg by mouth daily as needed for moderate pain.     amLODipine (NORVASC) 5 MG tablet Take 1 tablet (5 mg total) by mouth daily. 90 tablet 1   ASPIRIN LOW DOSE 81 MG EC tablet Take 81 mg by mouth  daily.     Calcium Carb-Cholecalciferol (CALCIUM/VITAMIN D) 500-200 MG-UNIT TABS Take 1 tablet by mouth in the morning and at bedtime.      glucose blood test strip 1 each by Other route daily. 100 each 12   HYDROcodone-acetaminophen (NORCO/VICODIN) 5-325 MG tablet Take 1 tablet by mouth every 6 (six) hours as needed. 90 tablet 0   Lancets (ONETOUCH ULTRASOFT) lancets 1 each by Other route daily. 100 each 12   lidocaine-prilocaine (EMLA) cream Apply 1 application topically as needed. 30 g 3   Magnesium 250 MG TABS Take 1 tablet (250 mg total) by mouth daily. 90 tablet 1   metFORMIN (GLUCOPHAGE) 1000 MG tablet Take 1 tablet (1,000 mg total) by mouth 2 (two) times daily. 180 tablet 0   metoprolol tartrate (LOPRESSOR) 50 MG tablet Take 1 tablet (50 mg total) by mouth 2 (two) times daily. 180 tablet 1   ondansetron (ZOFRAN) 8 MG tablet Take 1 tablet (8 mg total) by mouth 2 (two) times daily as needed (Nausea or vomiting). 30 tablet 1   pantoprazole (PROTONIX) 20 MG tablet Take 1 tablet (20 mg total) by mouth daily. 90 tablet 1   potassium chloride SA (KLOR-CON) 20 MEQ tablet Take 1 tablet (20 mEq total) by mouth daily. 30 tablet 1   prochlorperazine (COMPAZINE) 10  MG tablet Take 1 tablet (10 mg total) by mouth every 6 (six) hours as needed (Nausea or vomiting). 30 tablet 1   vitamin B-12 (CYANOCOBALAMIN) 1000 MCG tablet Take 1 tablet (1,000 mcg total) by mouth daily. 90 tablet 1   traMADol (ULTRAM) 50 MG tablet Take 0.5-1 tablets (25-50 mg total) by mouth every 6 (six) hours as needed. (Patient not taking: Reported on 12/25/2019) 30 tablet 0   No current facility-administered medications for this visit.    PHYSICAL EXAMINATION: ECOG PERFORMANCE STATUS: 1 - Symptomatic but completely ambulatory  Vitals:   01/12/20 0834  BP: (!) 138/71  Pulse: 91  Resp: 18  Temp: (!) 97.2 F (36.2 C)  SpO2: 100%   Filed Weights   01/12/20 0834  Weight: 123 lb 9.6 oz (56.1 kg)     GENERAL:alert, no distress and comfortable SKIN: No rash EYES: sclera clear LUNGS:  normal breathing effort Musculoskeletal: Left ankle ecchymosis with mild pedal edema NEURO: alert & oriented x 3 with fluent speech, steady gait with cane PAC without erythema  LABORATORY DATA:  I have reviewed the data as listed CBC Latest Ref Rng & Units 01/12/2020 01/06/2020 12/30/2019  WBC 4.0 - 10.5 K/uL 4.9 3.2(L) 3.4(L)  Hemoglobin 12.0 - 15.0 g/dL 8.7(L) 8.2(L) 10.0(L)  Hematocrit 36 - 46 % 26.5(L) 25.4(L) 30.6(L)  Platelets 150 - 400 K/uL 263 354 613(H)     CMP Latest Ref Rng & Units 01/12/2020 01/06/2020 12/30/2019  Glucose 70 - 99 mg/dL 182(H) 242(H) 237(H)  BUN 8 - 23 mg/dL 10 10 9   Creatinine 0.44 - 1.00 mg/dL 0.70 0.75 0.81  Sodium 135 - 145 mmol/L 137 137 138  Potassium 3.5 - 5.1 mmol/L 3.7 4.2 4.2  Chloride 98 - 111 mmol/L 104 103 104  CO2 22 - 32 mmol/L 25 25 24   Calcium 8.9 - 10.3 mg/dL 8.5(L) 8.8(L) 8.2(L)  Total Protein 6.5 - 8.1 g/dL 6.1(L) 6.3(L) 6.4(L)  Total Bilirubin 0.3 - 1.2 mg/dL 0.7 0.6 0.6  Alkaline Phos 38 - 126 U/L 565(H) 718(H) 646(H)  AST 15 - 41 U/L 76(H) 95(H) 103(H)  ALT 0 - 44 U/L 38 51(H) 51(H)      RADIOGRAPHIC STUDIES: I have personally reviewed the radiological images as listed and agreed with the findings in the report. No results found.   ASSESSMENT & PLAN: Tamara Almonteis a 74 y.o.femalewith    1. Pancreatic Cancer,stage III,unresectable -She initially presented to hospital in [place] with RUQ abdominal and jaundice. Her work up showed intrahepatic and extrahepatic biliary dilatation. EGDwith biopsy ofduodenal mucosa and head of pancreasshowed no cancer cells at that time. Sheunderwent biliary drain placement by IRin 05/2020 which was removed in 09/2019.  -She underwent biopsy of pancreatic head mass on 08/10/19 which showedinvasive well differentiated adenocarcinoma,peripancreaticLN biopsy wasalso positive.  -She underwentWhipple  Surgery on 08/29/19 but was aborted due to tumor invading the SMV. She instead hadgastrojejunostomy and J-tube placementdue tobowelblockages. -Her tumor has invaded her mesentery and vascular structures around her pancreas, making her tumor unresectable. It is no longer curable in this case but still treatable to controlher disease and prolong her life.  -CT CAP from 11/29/19 showsno evidence of distant metastasis, butagain demonstrated locally advanced disease. Her baseline Ca 19.9 was elevated at 911 which is concerning for high tumor burden and developing distant metastasis.  -Given her age, Dr. Burr Medico recommended (and agreed upon by multidisciplinary team)systemic chemo with Gemcitabinewith or without Abraxane2 weeks on/1 week off for 3-6 months followed byconsolidationRadiation.  -goal  is palliative -began single agent gemcitbaine on 01/08/20, she tolerated well with mild but well controlled nausea and fatigue/weakness. She recovered well and 50% dose-reduced abraxane was added with C2.  -Ms. Waldron appears stable. She tolerated gem/abraxane well overall except increased weakness. She was able to recover well.  -Her right back/flank pain improved significantly after adding abraxane, she has reduced pain med requirements. This indicates a clinical response to gem/abraxane. 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   -Chemo was dose reduced, changed to every 2 weeks, and Retacrit was added  2. S/p fall 01/04/20, left foot/ankle injury Tripped getting up from chair, no LOC  -Xray in ED negative -mild swelling and bruising, can bear weight. Continue supportive care and ambulate with a cane  3. Nutrition  -During her 08/29/19 surgery her whipple was aborted and instead had bypass of duodenum and CBD due to blockages.She also had J-tube placed at that time. -J tube removed  6/15 -using supplements, weight stabilized lately; continue follow-up with nutrition  4. Comorbidities: HTN, DM, H/o of spinal tumor, Chronic back pain -followed by PCP Dr. 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 When she moved to [place] in 09/1989 she had back surgery and had it removed.  -She has chronic low back pain; her cancer related right back/flank pain was not well managed with tramadol but improved with norco which she required q6.  -Since adding abraxane, her pain significantly improved, now goes 10 hours between doses; clinically, she is likely responding to treatment.   5. Social Support  -She is originally from [place] She moved to [place] in 09/1989. She is married with 4 children.  -After medical issues since late 05/2019 her son and his wife who are fluent in [place] moved to [place]. After cancer diagnosis, all of them moved to [place], [place] in early May given 3 of her children live in [place]. She has great family support  -She is a [employer] Witness, does not accept blood products   6. B/l leg pain  -doppler negative for DVT  7.Goal of care discussion  -The goal of care is palliative. -she is full code for now   Disposition: Ms. Lehr appears stable.  Her neutropenia resolved, anemia improved to Hg 8.7 today.  She is clinically doing well, pain remains minimal.  She will proceed with dose-reduced gemcitabine and Abraxane today, will start Retacrit 40,000 units every 2 weeks with treatment.  We will adjust Retacrit dose as needed. She does not need Udenyca with this cycle.  Will return for follow-up and chemo in 2 weeks.  All questions were answered. The patient knows to call the clinic with any problems, questions or concerns. No barriers to learning were detected.     Alla Feeling, NP 01/12/20

## 2020-01-12 ENCOUNTER — Encounter: Payer: Self-pay | Admitting: Nurse Practitioner

## 2020-01-12 ENCOUNTER — Inpatient Hospital Stay: Payer: 59

## 2020-01-12 ENCOUNTER — Inpatient Hospital Stay (HOSPITAL_BASED_OUTPATIENT_CLINIC_OR_DEPARTMENT_OTHER): Payer: 59 | Admitting: Nurse Practitioner

## 2020-01-12 ENCOUNTER — Other Ambulatory Visit: Payer: Self-pay

## 2020-01-12 VITALS — BP 138/71 | HR 91 | Temp 97.2°F | Resp 18 | Ht 64.0 in | Wt 123.6 lb

## 2020-01-12 DIAGNOSIS — C250 Malignant neoplasm of head of pancreas: Secondary | ICD-10-CM | POA: Diagnosis not present

## 2020-01-12 DIAGNOSIS — Z95828 Presence of other vascular implants and grafts: Secondary | ICD-10-CM

## 2020-01-12 DIAGNOSIS — Z7189 Other specified counseling: Secondary | ICD-10-CM

## 2020-01-12 DIAGNOSIS — Z5111 Encounter for antineoplastic chemotherapy: Secondary | ICD-10-CM | POA: Diagnosis not present

## 2020-01-12 LAB — CMP (CANCER CENTER ONLY)
ALT: 38 U/L (ref 0–44)
AST: 76 U/L — ABNORMAL HIGH (ref 15–41)
Albumin: 2.6 g/dL — ABNORMAL LOW (ref 3.5–5.0)
Alkaline Phosphatase: 565 U/L — ABNORMAL HIGH (ref 38–126)
Anion gap: 8 (ref 5–15)
BUN: 10 mg/dL (ref 8–23)
CO2: 25 mmol/L (ref 22–32)
Calcium: 8.5 mg/dL — ABNORMAL LOW (ref 8.9–10.3)
Chloride: 104 mmol/L (ref 98–111)
Creatinine: 0.7 mg/dL (ref 0.44–1.00)
GFR, Est AFR Am: >60 mL/min (ref >60)
GFR, Estimated: >60 mL/min (ref >60)
Glucose, Bld: 182 mg/dL — ABNORMAL HIGH (ref 70–99)
Potassium: 3.7 mmol/L (ref 3.5–5.1)
Sodium: 137 mmol/L (ref 135–145)
Total Bilirubin: 0.7 mg/dL (ref 0.3–1.2)
Total Protein: 6.1 g/dL — ABNORMAL LOW (ref 6.5–8.1)

## 2020-01-12 LAB — CBC WITH DIFFERENTIAL (CANCER CENTER ONLY)
Abs Immature Granulocytes: 0.09 10*3/uL — ABNORMAL HIGH (ref 0.00–0.07)
Basophils Absolute: 0.1 10*3/uL (ref 0.0–0.1)
Basophils Relative: 1 %
Eosinophils Absolute: 0 10*3/uL (ref 0.0–0.5)
Eosinophils Relative: 1 %
HCT: 26.5 % — ABNORMAL LOW (ref 36.0–46.0)
Hemoglobin: 8.7 g/dL — ABNORMAL LOW (ref 12.0–15.0)
Immature Granulocytes: 2 %
Lymphocytes Relative: 20 %
Lymphs Abs: 1 10*3/uL (ref 0.7–4.0)
MCH: 31 pg (ref 26.0–34.0)
MCHC: 32.8 g/dL (ref 30.0–36.0)
MCV: 94.3 fL (ref 80.0–100.0)
Monocytes Absolute: 0.6 10*3/uL (ref 0.1–1.0)
Monocytes Relative: 13 %
Neutro Abs: 3.1 10*3/uL (ref 1.7–7.7)
Neutrophils Relative %: 63 %
Platelet Count: 263 10*3/uL (ref 150–400)
RBC: 2.81 MIL/uL — ABNORMAL LOW (ref 3.87–5.11)
RDW: 16.9 % — ABNORMAL HIGH (ref 11.5–15.5)
WBC Count: 4.9 10*3/uL (ref 4.0–10.5)
nRBC: 0 % (ref 0.0–0.2)

## 2020-01-12 MED ORDER — SODIUM CHLORIDE 0.9 % IV SOLN
500.0000 mg/m2 | Freq: Once | INTRAVENOUS | Status: AC
  Administered 2020-01-12: 760 mg via INTRAVENOUS
  Filled 2020-01-12: qty 19.99

## 2020-01-12 MED ORDER — EPOETIN ALFA-EPBX 40000 UNIT/ML IJ SOLN
INTRAMUSCULAR | Status: AC
  Filled 2020-01-12: qty 1

## 2020-01-12 MED ORDER — SODIUM CHLORIDE 0.9% FLUSH
10.0000 mL | INTRAVENOUS | Status: DC | PRN
  Administered 2020-01-12: 10 mL
  Filled 2020-01-12: qty 10

## 2020-01-12 MED ORDER — SODIUM CHLORIDE 0.9% FLUSH
10.0000 mL | Freq: Once | INTRAVENOUS | Status: AC
  Administered 2020-01-12: 10 mL
  Filled 2020-01-12: qty 10

## 2020-01-12 MED ORDER — HEPARIN SOD (PORK) LOCK FLUSH 100 UNIT/ML IV SOLN
500.0000 [IU] | Freq: Once | INTRAVENOUS | Status: AC | PRN
  Administered 2020-01-12: 500 [IU]
  Filled 2020-01-12: qty 5

## 2020-01-12 MED ORDER — PROCHLORPERAZINE MALEATE 10 MG PO TABS
ORAL_TABLET | ORAL | Status: AC
  Filled 2020-01-12: qty 1

## 2020-01-12 MED ORDER — EPOETIN ALFA-EPBX 40000 UNIT/ML IJ SOLN
40000.0000 [IU] | Freq: Once | INTRAMUSCULAR | Status: AC
  Administered 2020-01-12: 40000 [IU] via SUBCUTANEOUS

## 2020-01-12 MED ORDER — PROCHLORPERAZINE MALEATE 10 MG PO TABS
10.0000 mg | ORAL_TABLET | Freq: Once | ORAL | Status: AC
  Administered 2020-01-12: 10 mg via ORAL

## 2020-01-12 MED ORDER — PACLITAXEL PROTEIN-BOUND CHEMO INJECTION 100 MG
60.0000 mg/m2 | Freq: Once | INTRAVENOUS | Status: AC
  Administered 2020-01-12: 100 mg via INTRAVENOUS
  Filled 2020-01-12: qty 20

## 2020-01-12 MED ORDER — SODIUM CHLORIDE 0.9 % IV SOLN
Freq: Once | INTRAVENOUS | Status: AC
  Filled 2020-01-12: qty 250

## 2020-01-12 NOTE — Patient Instructions (Signed)
[facility] Discharge Instructions for Patients Receiving Chemotherapy  Today you received the following chemotherapy agents: Abraxane and Gemzar  To help prevent nausea and vomiting after your treatment, we encourage you to take your nausea medication as prescribed.    If you develop nausea and vomiting that is not controlled by your nausea medication, call the clinic.   BELOW ARE SYMPTOMS THAT SHOULD BE REPORTED IMMEDIATELY:  *FEVER GREATER THAN 100.5 F  *CHILLS WITH OR WITHOUT FEVER  NAUSEA AND VOMITING THAT IS NOT CONTROLLED WITH YOUR NAUSEA MEDICATION  *UNUSUAL SHORTNESS OF BREATH  *UNUSUAL BRUISING OR BLEEDING  TENDERNESS IN MOUTH AND THROAT WITH OR WITHOUT PRESENCE OF ULCERS  *URINARY PROBLEMS  *BOWEL PROBLEMS  UNUSUAL RASH Items with * indicate a potential emergency and should be followed up as soon as possible.  Feel free to call the clinic should you have any questions or concerns. The clinic phone number is [phone].  Please show the CHEMO ALERT CARD at check-in to the Emergency Department and triage nurse.   

## 2020-01-13 ENCOUNTER — Ambulatory Visit: Payer: 59 | Admitting: Nurse Practitioner

## 2020-01-13 ENCOUNTER — Other Ambulatory Visit: Payer: Medicaid Other

## 2020-01-13 NOTE — Addendum Note (Signed)
Addended by: Tora Kindred on: 01/13/2020 11:42 AM   Modules accepted: Orders

## 2020-01-20 ENCOUNTER — Ambulatory Visit: Payer: Medicaid Other

## 2020-01-20 ENCOUNTER — Other Ambulatory Visit: Payer: Medicaid Other

## 2020-01-20 ENCOUNTER — Ambulatory Visit: Payer: Medicaid Other | Admitting: Hematology

## 2020-01-26 NOTE — Progress Notes (Signed)
[place]   [phone]) [phone] [phone]) [phone]   Clinic Follow up Note   Patient Care Team: Isaac Bliss, Rayford Halsted, MD as PCP - General (Internal Medicine) Jonnie Finner, RN as Oncology Nurse Navigator Truitt Merle, MD as Consulting Physician (Hematology) 01/27/2020  CHIEF COMPLAINT: Follow-up pancreas cancer  SUMMARY OF ONCOLOGIC HISTORY: Oncology History Overview Note  Cancer Staging Pancreatic cancer [facility]) Staging form: Exocrine Pancreas, AJCC 8th Edition - Clinical: Stage III (cT4, cN1, cM0) - Signed by Truitt Merle, MD on 11/30/2019    Pancreatic cancer ([place])  06/04/2019 -  Hospital Admission   Admit date: 06/04/2019 Admission diagnosis: biliary obstruction  Additional comments: Patient presented with right upper quadrant abdominal pain, jaundice.  She was admitted to [facility] in [place].  She was noticed to have intrahepatic and extrahepatic biliary dilatation along with main pancreatic duct dilatation on CT 1 months prior.  MRCP showed a possible slight progression of the biliary and pancreatic duct dilatation.  Patient was evaluated by GI, underwent EGD.  Biopsy of the duodenum and increase did not review malignant cells, biopsy of pancreatic head showed chronic pancreatitis.  Patient underwent biliary drain placement by IR.  She was discharged home on June 12, 2019.   06/07/2019 Procedure   EUS with biopsy by Dr. Glade Stanford at [facility], [place].  Impression: Partial obstruction of the duodenal and the second portion in the expected area of the ampulla.  The overlying mucosa on this edematous folds which were causing the obstruction appeared normal with the exception of one area which was biopsied.    06/10/2019 Pathology Results   Common bile duct for cytology: Negative for malignant cells. Biopsy head of pancreas: chronic Pancreatitis   06/10/2019 Procedure   IR placed a biliary drain for obstructive jaundice     07/30/2019 Imaging   CT abdomen and pelvis with contrast showed increased moderate distention of duodenal bulb without frank gastric distention.  Gallbladder absent, there is a stent in the low common bile duct without a significant dilatation.  There remains an external transhepatic biliary drain.  Increased to moderate dilatation of the pancreatic duct.  There is ill-defined fullness of the pancreatic head which is similar to prior exam.  The patient has underwent EUS aspiration and biliary stricture brushing without demonstration of tumor.  Pancreatic head the biopsy came back chronic pancreatitis.  Occult tumor remains possible.   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  Whipple procedure was planned but aborted because of him pancreatic mass was densely adherent to the mesentery vein.   11/21/2019 Initial Diagnosis   Pancreatic cancer ([place])   11/29/2019 Imaging   CT CAP W Contrast    IMPRESSION: 1. 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 2. Small lymph nodes in the retrocrural region on the LEFT largest 9 mm of uncertain significance. 3. Infiltration of the transverse mesocolon and extension of tumor into the root of the small bowel mesentery with abutment of SMV as well at the level of the first jejunal branch as described. 4. Profound hepatic steatosis. 5. Pneumobilia post stenting. 6. Signs of gastrojejunostomy and jejunostomy  tube placement.   11/30/2019 Cancer Staging   Staging form: Exocrine Pancreas, AJCC 8th  Edition - Clinical: Stage III (cT4, cN1, cM0) - Signed by Truitt Merle, MD on 11/30/2019   12/08/2019 Procedure   She had PAC placed 12/08/19.    12/09/2019 -  Chemotherapy   Gemcitabine 2 weeks on/1 week off starting 12/09/19. Plan to add Abraxane to C2      CURRENT THERAPY: Gemcitabine 2 weeks on/1 week offstarting 12/09/19.Added Abraxane with C2 at 50% dose and decreased Gemcitabine by 20%. She developed worsening anemia after C2D1, treatment changed to every 2 weeks with retacrit   INTERVAL HISTORY: Ms. Shugart returns for follow-up and treatment with Retacrit as scheduled.  She received last chemo and injection on 01/12/2020.  A few days after treatment she had a pre-syncopal episode after prolonged sitting then walking outside to her car.  She did not fall to the ground but was caught by family.  She recovered quickly without recurrent episodes.  She is eating adequately but probably could drink more.  She feels okay the week of treatment then develops moderate fatigue on her week off; remains functional.  Back pain resolved.  She has lower abdominal pain prior to BM occasionally.  Denies nausea, vomiting, constipation, diarrhea.  Denies mucositis, rash, fever, chills, cough, chest pain, dyspnea.  She was sneezing yesterday but that resolved.  The left ankle/foot injury continues to heal slowly. Denies neuropathy.   MEDICAL HISTORY:  Past Medical History:  Diagnosis Date  . Diabetes mellitus without complication ([place])   . Hypertension   . Pancreatic cancer [facility])     SURGICAL HISTORY: Past Surgical History:  Procedure Laterality Date  . APPENDECTOMY    . BACK SURGERY    . CHOLECYSTECTOMY    . IR GASTROSTOMY TUBE REMOVAL  11/30/2019  . IR IMAGING GUIDED PORT INSERTION  12/08/2019    I have reviewed the social history and family history with the patient and they are unchanged from previous note.  ALLERGIES:  has No Known Allergies.  MEDICATIONS:  Current Outpatient Medications   Medication Sig Dispense Refill  . acetaminophen (TYLENOL) 500 MG tablet Take 500-1,000 mg by mouth daily as needed for moderate pain.    Marland Kitchen amLODipine (NORVASC) 5 MG tablet Take 1 tablet (5 mg total) by mouth daily. 90 tablet 1  . ASPIRIN LOW DOSE 81 MG EC tablet Take 81 mg by mouth daily.    . Calcium Carb-Cholecalciferol (CALCIUM/VITAMIN D) 500-200 MG-UNIT TABS Take 1 tablet by mouth in the morning and at bedtime.     Marland Kitchen glucose blood test strip 1 each by Other route daily. 100 each 12  . HYDROcodone-acetaminophen (NORCO/VICODIN) 5-325 MG tablet Take 1 tablet by mouth every 6 (six) hours as needed. 90 tablet 0  . Lancets (ONETOUCH ULTRASOFT) lancets 1 each by Other route daily. 100 each 12  . lidocaine-prilocaine (EMLA) cream Apply 1 application topically as needed. 30 g 3  . Magnesium 250 MG TABS Take 1 tablet (250 mg total) by mouth daily. 90 tablet 1  . metFORMIN (GLUCOPHAGE) 1000 MG tablet Take 1 tablet (1,000 mg total) by mouth 2 (two) times daily. 180 tablet 0  . metoprolol tartrate (LOPRESSOR) 50 MG tablet Take 1 tablet (50 mg total) by mouth 2 (two) times daily. 180 tablet 1  . ondansetron (ZOFRAN) 8 MG tablet Take 1 tablet (8 mg total) by mouth 2 (two) times daily as needed (Nausea or vomiting). 30 tablet 1  . pantoprazole (PROTONIX) 20  MG tablet Take 1 tablet (20 mg total) by mouth daily. 90 tablet 1  . prochlorperazine (COMPAZINE) 10 MG tablet Take 1 tablet (10 mg total) by mouth every 6 (six) hours as needed (Nausea or vomiting). 30 tablet 1  . vitamin B-12 (CYANOCOBALAMIN) 1000 MCG tablet Take 1 tablet (1,000 mcg total) by mouth daily. 90 tablet 1  . potassium chloride SA (KLOR-CON) 20 MEQ tablet Take 1 tablet (20 mEq total) by mouth daily. 30 tablet 1  . traMADol (ULTRAM) 50 MG tablet Take 0.5-1 tablets (25-50 mg total) by mouth every 6 (six) hours as needed. (Patient not taking: Reported on 12/25/2019) 30 tablet 0   No current facility-administered medications for this visit.    Facility-Administered Medications Ordered in Other Visits  Medication Dose Route Frequency Provider Last Rate Last Admin  . gemcitabine (GEMZAR) 760 mg in sodium chloride 0.9 % 250 mL chemo infusion  500 mg/m2 (Order-Specific) Intravenous Once Truitt Merle, MD      . heparin lock flush 100 unit/mL  500 Units Intracatheter Once PRN Truitt Merle, MD      . sodium chloride flush (NS) 0.9 % injection 10 mL  10 mL Intracatheter PRN Truitt Merle, MD        PHYSICAL EXAMINATION: ECOG PERFORMANCE STATUS: 1 - Symptomatic but completely ambulatory  Vitals:   01/27/20 1315  BP: 130/83  Pulse: 89  Resp: 17  Temp: 97.8 F (36.6 C)  SpO2: 100%   Filed Weights   01/27/20 1315  Weight: 122 lb 4.8 oz (55.5 kg)    GENERAL:alert, no distress and comfortable SKIN: No rash to exposed skin EYES:  sclera clear NECK: Without mass LUNGS: clear with normal breathing effort HEART: regular rate & rhythm, no lower extremity edema ABDOMEN:abdomen soft, non-tender and normal bowel sounds Musculoskeletal: No focal spinal tenderness NEURO: alert & oriented x 3 with fluent speech, steady gait with cane PAC without erythema  LABORATORY DATA:  I have reviewed the data as listed CBC Latest Ref Rng & Units 01/27/2020 01/12/2020 01/06/2020  WBC 4.0 - 10.5 K/uL 4.7 4.9 3.2(L)  Hemoglobin 12.0 - 15.0 g/dL 9.5(L) 8.7(L) 8.2(L)  Hematocrit 36 - 46 % 29.5(L) 26.5(L) 25.4(L)  Platelets 150 - 400 K/uL 399 263 354     CMP Latest Ref Rng & Units 01/27/2020 01/12/2020 01/06/2020  Glucose 70 - 99 mg/dL 251(H) 182(H) 242(H)  BUN 8 - 23 mg/dL 9 10 10   Creatinine 0.44 - 1.00 mg/dL 0.83 0.70 0.75  Sodium 135 - 145 mmol/L 137 137 137  Potassium 3.5 - 5.1 mmol/L 4.0 3.7 4.2  Chloride 98 - 111 mmol/L 102 104 103  CO2 22 - 32 mmol/L 24 25 25   Calcium 8.9 - 10.3 mg/dL 8.8(L) 8.5(L) 8.8(L)  Total Protein 6.5 - 8.1 g/dL 6.6 6.1(L) 6.3(L)  Total Bilirubin 0.3 - 1.2 mg/dL 0.8 0.7 0.6  Alkaline Phos 38 - 126 U/L 560(H) 565(H) 718(H)   AST 15 - 41 U/L 120(H) 76(H) 95(H)  ALT 0 - 44 U/L 58(H) 38 51(H)      RADIOGRAPHIC STUDIES: I have personally reviewed the radiological images as listed and agreed with the findings in the report. No results found.   ASSESSMENT & PLAN: Tamara Wood a 74 y.o.femalewith    1. Pancreatic Cancer,stage III,unresectable -She initially presented to hospital in [place] with RUQ abdominal and jaundice. Her work up showed intrahepatic and extrahepatic biliary dilatation. EGDwith biopsy ofduodenal mucosa and head of pancreasshowed no cancer cells at that time. Sheunderwent biliary  drain placement by IRin 05/2020 which was removed in 09/2019.  -She underwent biopsy of pancreatic head mass on 08/10/19 which showedinvasive well differentiated adenocarcinoma,peripancreaticLN biopsy wasalso positive.  -She underwentWhipple Surgery on 08/29/19 but was aborted due to tumor invading the SMV. She instead hadgastrojejunostomy and J-tube placementdue tobowelblockages. -Her tumor has invaded her mesentery and vascular structures around her pancreas, making her tumor unresectable. It is no longer curable in this case but still treatable to controlher disease and prolong her life.  -CT CAP from 11/29/19 showsno evidence of distant metastasis, butagain demonstrated locally advanced disease. Her baseline Ca 19.9 was elevated at 911 which is concerning for high tumor burden and developing distant metastasis.  -Given her age, Dr. Burr Medico recommended (and agreed upon by multidisciplinary team)systemic chemo with Gemcitabinewith or without Abraxane2 weeks on/1 week off for 3-6 months followed byconsolidationRadiation.  -goal is palliative -began single agentgemcitbaine on 01/08/20, she tolerated well with mild but well controlled nausea and fatigue/weakness. She recovered well and50% dose-reduced abraxane was added with C2. 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   -Chemo was dose reduced, changed to every 2 weeks, and Retacrit was added  2. S/p fall 01/04/20, left foot/ankle injury Tripped getting up from chair, no LOC  -Xray in ED negative -mild swelling and bruising, can bear weight. Continue supportive care and ambulate with a cane -Improving  3. Nutrition  -During her 08/29/19 surgery her whipple was aborted and instead had bypass of duodenum and CBD due to blockages.She also had J-tube placed at that time. -J tube removed 6/15 -using supplements, weightstabilized lately; continue follow-up with nutrition  4. Comorbidities: HTN, DM, H/o of spinal tumor, Chronic back pain -followed by PCP Dr. 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 When she moved to [place] in 09/1989 she had back surgery and had it removed.  -She haschroniclowback pain; her cancer related right back/flank pain improved with Norco -Resolved since adding Abraxane, she is likely responding to treatment  5. Social Support  -She is originally from [place] She moved to [place] in 09/1989. She is married with 4 children.  -After medical issues since late 05/2019 her son and his wife who are fluent in [place] moved to [place]. After cancer diagnosis, all of them moved to [place], [place] in early May given 3 of her children live in [place]. She has great family support  -She is a Jehovah Witness, does not accept blood products  6. B/l leg pain  -doppler negative for DVT in 11/2019  7.Goal of care discussion  -The goal of care is palliative. -she is full code for now  Disposition:  Ms. Looman appears stable.  She completed to  2 cycles of chemotherapy.  She tolerates treatment well, mainly with fatigue.  Right\flank pain resolved on gem/Abraxane.  She had a presyncopal episode 2 weeks ago after prolonged sitting then getting up to walk to her car, she did not call us.  Possible orthostatic hypotension versus vasovagal.  I encouraged her to remain hydrated, ambulate with a cane, and call us if she does not feel well or has recurrent episodes.  She otherwise tolerates treatment and is able to recover well.   She continues  Retacrit every 2 weeks with chemo, Hgb improved to 9.5 today.  CMP shows persistent transaminitis with normal bilirubin.  Labs are adequate to proceed with reticacrit and cycle 3 gemcitabine/Abraxane today, no dosage adjustments.  We will hold Udenyca with this cycle.  She will return for lab, follow-up, and gemcitabine and Abraxane in 2 weeks.  All questions were answered. The patient knows to call the clinic with any problems, questions or concerns. No barriers to learning was detected.     Alla Feeling, NP 01/27/20

## 2020-01-27 ENCOUNTER — Telehealth: Payer: Self-pay | Admitting: Internal Medicine

## 2020-01-27 ENCOUNTER — Encounter: Payer: Self-pay | Admitting: Hematology

## 2020-01-27 ENCOUNTER — Inpatient Hospital Stay: Payer: 59

## 2020-01-27 ENCOUNTER — Inpatient Hospital Stay: Payer: 59 | Admitting: Nutrition

## 2020-01-27 ENCOUNTER — Encounter: Payer: Self-pay | Admitting: Nurse Practitioner

## 2020-01-27 ENCOUNTER — Other Ambulatory Visit: Payer: Self-pay

## 2020-01-27 ENCOUNTER — Ambulatory Visit: Payer: Medicaid Other

## 2020-01-27 ENCOUNTER — Inpatient Hospital Stay: Payer: 59 | Attending: Hematology | Admitting: Nurse Practitioner

## 2020-01-27 VITALS — BP 130/83 | HR 89 | Temp 97.8°F | Resp 17 | Ht 64.0 in | Wt 122.3 lb

## 2020-01-27 DIAGNOSIS — C250 Malignant neoplasm of head of pancreas: Secondary | ICD-10-CM

## 2020-01-27 DIAGNOSIS — E119 Type 2 diabetes mellitus without complications: Secondary | ICD-10-CM | POA: Diagnosis not present

## 2020-01-27 DIAGNOSIS — Z95828 Presence of other vascular implants and grafts: Secondary | ICD-10-CM

## 2020-01-27 DIAGNOSIS — R7401 Elevation of levels of liver transaminase levels: Secondary | ICD-10-CM | POA: Diagnosis not present

## 2020-01-27 DIAGNOSIS — Z5111 Encounter for antineoplastic chemotherapy: Secondary | ICD-10-CM | POA: Diagnosis present

## 2020-01-27 DIAGNOSIS — Z79899 Other long term (current) drug therapy: Secondary | ICD-10-CM | POA: Insufficient documentation

## 2020-01-27 DIAGNOSIS — D6481 Anemia due to antineoplastic chemotherapy: Secondary | ICD-10-CM | POA: Insufficient documentation

## 2020-01-27 DIAGNOSIS — R112 Nausea with vomiting, unspecified: Secondary | ICD-10-CM | POA: Insufficient documentation

## 2020-01-27 DIAGNOSIS — E46 Unspecified protein-calorie malnutrition: Secondary | ICD-10-CM | POA: Diagnosis not present

## 2020-01-27 DIAGNOSIS — I10 Essential (primary) hypertension: Secondary | ICD-10-CM | POA: Insufficient documentation

## 2020-01-27 DIAGNOSIS — Z7189 Other specified counseling: Secondary | ICD-10-CM

## 2020-01-27 LAB — CBC WITH DIFFERENTIAL (CANCER CENTER ONLY)
Abs Immature Granulocytes: 0.02 10*3/uL (ref 0.00–0.07)
Basophils Absolute: 0.1 10*3/uL (ref 0.0–0.1)
Basophils Relative: 1 %
Eosinophils Absolute: 0.1 10*3/uL (ref 0.0–0.5)
Eosinophils Relative: 2 %
HCT: 29.5 % — ABNORMAL LOW (ref 36.0–46.0)
Hemoglobin: 9.5 g/dL — ABNORMAL LOW (ref 12.0–15.0)
Immature Granulocytes: 0 %
Lymphocytes Relative: 19 %
Lymphs Abs: 0.9 10*3/uL (ref 0.7–4.0)
MCH: 31 pg (ref 26.0–34.0)
MCHC: 32.2 g/dL (ref 30.0–36.0)
MCV: 96.4 fL (ref 80.0–100.0)
Monocytes Absolute: 0.5 10*3/uL (ref 0.1–1.0)
Monocytes Relative: 11 %
Neutro Abs: 3.2 10*3/uL (ref 1.7–7.7)
Neutrophils Relative %: 67 %
Platelet Count: 399 10*3/uL (ref 150–400)
RBC: 3.06 MIL/uL — ABNORMAL LOW (ref 3.87–5.11)
RDW: 17.4 % — ABNORMAL HIGH (ref 11.5–15.5)
WBC Count: 4.7 10*3/uL (ref 4.0–10.5)
nRBC: 0 % (ref 0.0–0.2)

## 2020-01-27 LAB — CMP (CANCER CENTER ONLY)
ALT: 58 U/L — ABNORMAL HIGH (ref 0–44)
AST: 120 U/L — ABNORMAL HIGH (ref 15–41)
Albumin: 2.7 g/dL — ABNORMAL LOW (ref 3.5–5.0)
Alkaline Phosphatase: 560 U/L — ABNORMAL HIGH (ref 38–126)
Anion gap: 11 (ref 5–15)
BUN: 9 mg/dL (ref 8–23)
CO2: 24 mmol/L (ref 22–32)
Calcium: 8.8 mg/dL — ABNORMAL LOW (ref 8.9–10.3)
Chloride: 102 mmol/L (ref 98–111)
Creatinine: 0.83 mg/dL (ref 0.44–1.00)
GFR, Est AFR Am: >60 mL/min (ref >60)
GFR, Estimated: >60 mL/min (ref >60)
Glucose, Bld: 251 mg/dL — ABNORMAL HIGH (ref 70–99)
Potassium: 4 mmol/L (ref 3.5–5.1)
Sodium: 137 mmol/L (ref 135–145)
Total Bilirubin: 0.8 mg/dL (ref 0.3–1.2)
Total Protein: 6.6 g/dL (ref 6.5–8.1)

## 2020-01-27 MED ORDER — HEPARIN SOD (PORK) LOCK FLUSH 100 UNIT/ML IV SOLN
500.0000 [IU] | Freq: Once | INTRAVENOUS | Status: AC | PRN
  Administered 2020-01-27: 500 [IU]
  Filled 2020-01-27: qty 5

## 2020-01-27 MED ORDER — SODIUM CHLORIDE 0.9% FLUSH
10.0000 mL | INTRAVENOUS | Status: DC | PRN
  Administered 2020-01-27: 10 mL
  Filled 2020-01-27: qty 10

## 2020-01-27 MED ORDER — ALTEPLASE 2 MG IJ SOLR
2.0000 mg | Freq: Once | INTRAMUSCULAR | Status: AC
  Administered 2020-01-27: 2 mg
  Filled 2020-01-27: qty 2

## 2020-01-27 MED ORDER — EPOETIN ALFA-EPBX 40000 UNIT/ML IJ SOLN
40000.0000 [IU] | Freq: Once | INTRAMUSCULAR | Status: AC
  Administered 2020-01-27: 40000 [IU] via SUBCUTANEOUS

## 2020-01-27 MED ORDER — EPOETIN ALFA-EPBX 40000 UNIT/ML IJ SOLN
INTRAMUSCULAR | Status: AC
  Filled 2020-01-27: qty 1

## 2020-01-27 MED ORDER — PROCHLORPERAZINE MALEATE 10 MG PO TABS
10.0000 mg | ORAL_TABLET | Freq: Once | ORAL | Status: AC
  Administered 2020-01-27: 10 mg via ORAL

## 2020-01-27 MED ORDER — SODIUM CHLORIDE 0.9 % IV SOLN
Freq: Once | INTRAVENOUS | Status: AC
  Filled 2020-01-27: qty 250

## 2020-01-27 MED ORDER — SODIUM CHLORIDE 0.9% FLUSH
10.0000 mL | Freq: Once | INTRAVENOUS | Status: DC
  Filled 2020-01-27: qty 10

## 2020-01-27 MED ORDER — PACLITAXEL PROTEIN-BOUND CHEMO INJECTION 100 MG
60.0000 mg/m2 | Freq: Once | INTRAVENOUS | Status: AC
  Administered 2020-01-27: 100 mg via INTRAVENOUS
  Filled 2020-01-27: qty 20

## 2020-01-27 MED ORDER — ALTEPLASE 2 MG IJ SOLR
INTRAMUSCULAR | Status: AC
  Filled 2020-01-27: qty 2

## 2020-01-27 MED ORDER — SODIUM CHLORIDE 0.9 % IV SOLN
500.0000 mg/m2 | Freq: Once | INTRAVENOUS | Status: AC
  Administered 2020-01-27: 760 mg via INTRAVENOUS
  Filled 2020-01-27: qty 19.99

## 2020-01-27 MED ORDER — PROCHLORPERAZINE MALEATE 10 MG PO TABS
ORAL_TABLET | ORAL | Status: AC
  Filled 2020-01-27: qty 1

## 2020-01-27 MED ORDER — HEPARIN SOD (PORK) LOCK FLUSH 100 UNIT/ML IV SOLN
500.0000 [IU] | Freq: Once | INTRAVENOUS | Status: DC
  Filled 2020-01-27: qty 5

## 2020-01-27 NOTE — Progress Notes (Signed)
Per Lacie OK to treat with AST 120

## 2020-01-27 NOTE — Patient Instructions (Signed)
[facility] Discharge Instructions for Patients Receiving Chemotherapy  Today you received the following chemotherapy agents: Abraxane and Gemzar  To help prevent nausea and vomiting after your treatment, we encourage you to take your nausea medication as prescribed.    If you develop nausea and vomiting that is not controlled by your nausea medication, call the clinic.   BELOW ARE SYMPTOMS THAT SHOULD BE REPORTED IMMEDIATELY:  *FEVER GREATER THAN 100.5 F  *CHILLS WITH OR WITHOUT FEVER  NAUSEA AND VOMITING THAT IS NOT CONTROLLED WITH YOUR NAUSEA MEDICATION  *UNUSUAL SHORTNESS OF BREATH  *UNUSUAL BRUISING OR BLEEDING  TENDERNESS IN MOUTH AND THROAT WITH OR WITHOUT PRESENCE OF ULCERS  *URINARY PROBLEMS  *BOWEL PROBLEMS  UNUSUAL RASH Items with * indicate a potential emergency and should be followed up as soon as possible.  Feel free to call the clinic should you have any questions or concerns. The clinic phone number is [phone].  Please show the CHEMO ALERT CARD at check-in to the Emergency Department and triage nurse.   

## 2020-01-27 NOTE — Progress Notes (Signed)
Patient was identified to be at risk for malnutrition on the MST secondary to poor appetite and weight loss.  I met with patient during infusion using a Spanish interpreter.  74 year old female diagnosed with pancreas cancer followed by Dr. Burr Medico.  Past medical history includes diabetes and hypertension  Medications include magnesium, Glucophage, Reglan, Zofran, Protonix, and Compazine.  Labs include glucose 251 and albumin 2.7.  Height: 5 feet 4 inches. Weight: 122.3 pounds. Usual body weight: Approximately 150 pounds per patient. BMI: 20.99.  Patient reports her appetite is fine.  She denies nutrition impact symptoms. She reports she has lost 30 pounds since January 2021.  She noted she had decreased appetite prior to diagnosis but this is now improved.  Nutrition diagnosis: Unintended weight loss related to pancreas cancer and associated treatments as evidenced by approximately 30 pound weight loss from usual body weight.  Intervention: Patient educated to consume smaller more frequent meals and snacks with adequate calories and protein. Reviewed high-protein foods. Recommended patient could try oral nutrition supplements such as Glucerna or boost glucose control. Questions were answered.  Teach back method used.  Patient provided with Spanish education materials.  Monitoring, evaluation, goals: Patient will tolerate adequate calories and protein to minimize further weight loss.  Next visit: Patient will contact me for nutrition follow-up as needed.  **Disclaimer: This note was dictated with voice recognition software. Similar sounding words can inadvertently be transcribed and this note may contain transcription errors which may not have been corrected upon publication of note.**

## 2020-01-27 NOTE — Telephone Encounter (Signed)
Onetouch Ultra 2  is what delica plus lancets for the machine. A fax will be coming from the pharmacist  [id number] [address]  [place], [place] [address]  Phone:  [phone] Fax:  [phone]    Call [name] [phone] with any questions.  Please advise

## 2020-01-27 NOTE — Patient Instructions (Signed)

## 2020-01-27 NOTE — Progress Notes (Signed)
Met with patient/accompanying adult whom provided proof of income for [employer].  Patient approved for one-time $1000 [employer] grant to assist with personal expenses while going through treatment. Discussed in detail expenses and how they are covered. She has a copy of the approval letter and expense sheet as well as the Outpatient pharmacy information. She received a gas card today.  She has my card for any additional financial questions or concerns.

## 2020-01-28 ENCOUNTER — Telehealth: Payer: Self-pay | Admitting: Nurse Practitioner

## 2020-01-28 LAB — CANCER ANTIGEN 19-9: CA 19-9: 946 U/mL — ABNORMAL HIGH (ref 0–35)

## 2020-01-28 MED ORDER — GLUCOSE BLOOD VI STRP: 1.0000 | ORAL_STRIP | Freq: Two times a day (BID) | 3 refills | Status: AC

## 2020-01-28 MED ORDER — ONETOUCH ULTRASOFT LANCETS MISC: 1.0000 | Freq: Two times a day (BID) | 3 refills | Status: DC

## 2020-01-28 NOTE — Telephone Encounter (Signed)
Scheduled per 8/12 los. Spoke with pt's granddaughter and is aware of appt time and date.

## 2020-01-28 NOTE — Telephone Encounter (Signed)
Fax received.  Refill sent.

## 2020-02-03 ENCOUNTER — Other Ambulatory Visit: Payer: Self-pay | Admitting: *Deleted

## 2020-02-03 MED ORDER — ONETOUCH DELICA LANCETS 33G MISC: 1 refills | Status: AC

## 2020-02-07 ENCOUNTER — Ambulatory Visit: Payer: Self-pay | Admitting: Family Medicine

## 2020-02-09 NOTE — Progress Notes (Signed)
[place]   [phone] [phone]   Clinic Follow up Note   Patient Care Team: Isaac Bliss, Rayford Halsted, MD as PCP - General (Internal Medicine) Jonnie Finner, RN as Oncology Nurse Navigator Truitt Merle, MD as Consulting Physician (Hematology)  Date of Service:  02/10/2020  CHIEF COMPLAINT: F/u of Pancreatic Cancer  SUMMARY OF ONCOLOGIC HISTORY: Oncology History Overview Note  Cancer Staging Pancreatic cancer [facility]) Staging form: Exocrine Pancreas, AJCC 8th Edition - Clinical: Stage III (cT4, cN1, cM0) - Signed by Truitt Merle, MD on 11/30/2019    Pancreatic cancer ([place])  06/04/2019 -  Hospital Admission   Admit date: 06/04/2019 Admission diagnosis: biliary obstruction  Additional comments: Patient presented with right upper quadrant abdominal pain, jaundice.  She was admitted to [facility] in [place].  She was noticed to have intrahepatic and extrahepatic biliary dilatation along with main pancreatic duct dilatation on CT 1 months prior.  MRCP showed a possible slight progression of the biliary and pancreatic duct dilatation.  Patient was evaluated by GI, underwent EGD.  Biopsy of the duodenum and increase did not review malignant cells, biopsy of pancreatic head showed chronic pancreatitis.  Patient underwent biliary drain placement by IR.  She was discharged home on June 12, 2019.   06/07/2019 Procedure   EUS with biopsy by Dr. Glade Stanford at [facility], [place].  Impression: Partial obstruction of the duodenal and the second portion in the expected area of the ampulla.  The overlying mucosa on this edematous folds which were causing the obstruction appeared normal with the exception of one area which was biopsied.    06/10/2019 Pathology Results   Common bile duct for cytology: Negative for malignant cells. Biopsy head of pancreas: chronic Pancreatitis   06/10/2019 Procedure   IR placed a biliary drain for  obstructive jaundice    07/30/2019 Imaging   CT abdomen and pelvis with contrast showed increased moderate distention of duodenal bulb without frank gastric distention.  Gallbladder absent, there is a stent in the low common bile duct without a significant dilatation.  There remains an external transhepatic biliary drain.  Increased to moderate dilatation of the pancreatic duct.  There is ill-defined fullness of the pancreatic head which is similar to prior exam.  The patient has underwent EUS aspiration and biliary stricture brushing without demonstration of tumor.  Pancreatic head the biopsy came back chronic pancreatitis.  Occult tumor remains possible.   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  Whipple procedure was planned but aborted because of him pancreatic mass was densely adherent to the mesentery vein.   11/21/2019 Initial Diagnosis   Pancreatic cancer ([place])   11/29/2019 Imaging   CT CAP W Contrast    IMPRESSION: 1. 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 2. Small lymph nodes in the retrocrural region on the LEFT largest 9 mm of uncertain significance. 3. Infiltration of the transverse mesocolon and extension of tumor into the root of the small bowel mesentery with abutment of SMV as well at the level of the first jejunal branch as described. 4. Profound hepatic steatosis. 5. Pneumobilia post stenting.  6. Signs of gastrojejunostomy and jejunostomy tube placement.   11/30/2019 Cancer Staging   Staging form: Exocrine  Pancreas, AJCC 8th Edition - Clinical: Stage III (cT4, cN1, cM0) - Signed by Truitt Merle, MD on 11/30/2019   12/08/2019 Procedure   She had PAC placed 12/08/19.    12/09/2019 -  Chemotherapy   Gemcitabine 2 weeks on/1 week off starting 12/09/19. Added Abraxane with C2 at 50% dose and decreased Gemcitabine by 20%.   -She developed worsening anemia after C2D1, treatment changed to every 2 weeks with retacrit on 01/12/20.   -Gemcitabine dose increased and Retacrit dose decreased with C4 on 02/10/20.         CURRENT THERAPY:  Gemcitabine 2 weeks on/1 week offstarting 12/09/19.Added Abraxane with C2 at 50% dose and decreased Gemcitabine by 20%.  -She developed worsening anemia after C2D1, treatment changed to every 2 weeks with retacriton 01/12/20.   -Gemcitabine dose increased and Retacrit dose reduced with C4 on 02/10/20.    INTERVAL HISTORY:  [name] is here for a follow up and treatment. She presents to the clinic with her daughter and her Patent attorney. She notes she is doing well overall. She will feel weak occasionally but able to tolerate it. She will feel fatigued some days after chemo infusion. She notes she does not do much at home with chores but can take care of herself. She is mostly sitting down as she cannot stand up for long. Her fatigue exacerbated with certain activities. She is eating adequate with good appetite. She denies nausea and diarrhea. She notes she did vomit yesterday. She notes she had a humming noise in her ear and head that lasts up to 10 minutes at a time. Her BP still gets hypotensive occasionally. She stopped Metoprolol unless she has HTN. She is still on Amlodipine.     REVIEW OF SYSTEMS:   Constitutional: Denies fevers, chills or abnormal weight loss (+) Intermittent fatigue and weakness  Eyes: Denies blurriness of vision Ears, nose, mouth, throat, and face: Denies mucositis or sore throat Respiratory: Denies cough, dyspnea or wheezes Cardiovascular:  Denies palpitation, chest discomfort or lower extremity swelling Gastrointestinal:  Denies heartburn or change in bowel habits (+) Very mild N&V Skin: Denies abnormal skin rashes Lymphatics: Denies new lymphadenopathy or easy bruising Neurological:Denies numbness, tingling or new weaknesses Behavioral/Psych: Mood is stable, no new changes  All other systems were reviewed with the patient and are negative.  MEDICAL HISTORY:  Past Medical History:  Diagnosis Date  . Diabetes mellitus without complication ([place])   . Hypertension   . Pancreatic cancer [facility])     SURGICAL HISTORY: Past Surgical History:  Procedure Laterality Date  . APPENDECTOMY    . BACK SURGERY    . CHOLECYSTECTOMY    . IR GASTROSTOMY TUBE REMOVAL  11/30/2019  . IR IMAGING GUIDED PORT INSERTION  12/08/2019    I have reviewed the social history and family history with the patient and they are unchanged from previous note.  ALLERGIES:  has No Known Allergies.  MEDICATIONS:  Current Outpatient Medications  Medication Sig Dispense Refill  . acetaminophen (TYLENOL) 500 MG tablet Take 500-1,000 mg by mouth daily as needed for moderate pain.    Marland Kitchen amLODipine (NORVASC) 5 MG tablet Take 1 tablet (5 mg total) by mouth daily. 90 tablet 1  . ASPIRIN LOW DOSE 81 MG EC tablet Take 81 mg by mouth daily.    . Calcium Carb-Cholecalciferol (CALCIUM/VITAMIN D) 500-200 MG-UNIT TABS Take 1 tablet by  mouth in the morning and at bedtime.     Marland Kitchen glucose blood test strip 1 each by Other route in the morning and at bedtime. Dx E11.9  For OneTouch Ultra 2 100 each 3  . HYDROcodone-acetaminophen (NORCO/VICODIN) 5-325 MG tablet Take 1 tablet by mouth every 6 (six) hours as needed. 90 tablet 0  . lidocaine-prilocaine (EMLA) cream Apply 1 application topically as needed. 30 g 3  . Magnesium 250 MG TABS Take 1 tablet (250 mg total) by mouth daily. 90 tablet 1  . metFORMIN (GLUCOPHAGE) 1000 MG tablet Take 1 tablet (1,000 mg total) by mouth 2 (two)  times daily. 180 tablet 0  . metoprolol tartrate (LOPRESSOR) 50 MG tablet Take 1 tablet (50 mg total) by mouth 2 (two) times daily. 180 tablet 1  . ondansetron (ZOFRAN) 8 MG tablet Take 1 tablet (8 mg total) by mouth 2 (two) times daily as needed (Nausea or vomiting). 30 tablet 1  . OneTouch Delica Lancets 41D MISC Use twice daily for glucose control, Dx E11.9 200 each 1  . pantoprazole (PROTONIX) 20 MG tablet Take 1 tablet (20 mg total) by mouth daily. 90 tablet 1  . potassium chloride SA (KLOR-CON) 20 MEQ tablet Take 1 tablet (20 mEq total) by mouth daily. 30 tablet 1  . prochlorperazine (COMPAZINE) 10 MG tablet Take 1 tablet (10 mg total) by mouth every 6 (six) hours as needed (Nausea or vomiting). 30 tablet 1  . traMADol (ULTRAM) 50 MG tablet Take 0.5-1 tablets (25-50 mg total) by mouth every 6 (six) hours as needed. (Patient not taking: Reported on 12/25/2019) 30 tablet 0  . vitamin B-12 (CYANOCOBALAMIN) 1000 MCG tablet Take 1 tablet (1,000 mcg total) by mouth daily. 90 tablet 1   No current facility-administered medications for this visit.   Facility-Administered Medications Ordered in Other Visits  Medication Dose Route Frequency Provider Last Rate Last Admin  . sodium chloride flush (NS) 0.9 % injection 10 mL  10 mL Intracatheter PRN Truitt Merle, MD   10 mL at 02/10/20 1335    PHYSICAL EXAMINATION: ECOG PERFORMANCE STATUS: 2 - Symptomatic, <50% confined to bed  Vitals:   02/10/20 1022  BP: 131/72  Pulse: 65  Resp: 18  Temp: (!) 97.4 F (36.3 C)  SpO2: 100%   Filed Weights   02/10/20 1022  Weight: 120 lb (54.4 kg)    Due to COVID19 we will limit examination to appearance. Patient had no complaints.  GENERAL:alert, no distress and comfortable SKIN: skin color normal, no rashes or significant lesions EYES: normal, Conjunctiva are pink and non-injected, sclera clear  NEURO: alert & oriented x 3 with fluent speech   LABORATORY DATA:  I have reviewed the data as listed CBC  Latest Ref Rng & Units 02/10/2020 01/27/2020 01/12/2020  WBC 4.0 - 10.5 K/uL 4.2 4.7 4.9  Hemoglobin 12.0 - 15.0 g/dL 10.5(L) 9.5(L) 8.7(L)  Hematocrit 36 - 46 % 32.7(L) 29.5(L) 26.5(L)  Platelets 150 - 400 K/uL 346 399 263     CMP Latest Ref Rng & Units 02/10/2020 01/27/2020 01/12/2020  Glucose 70 - 99 mg/dL 161(H) 251(H) 182(H)  BUN 8 - 23 mg/dL 13 9 10   Creatinine 0.44 - 1.00 mg/dL 0.79 0.83 0.70  Sodium 135 - 145 mmol/L 139 137 137  Potassium 3.5 - 5.1 mmol/L 3.9 4.0 3.7  Chloride 98 - 111 mmol/L 103 102 104  CO2 22 - 32 mmol/L 29 24 25   Calcium 8.9 - 10.3 mg/dL 9.0 8.8(L) 8.5(L)  Total  Protein 6.5 - 8.1 g/dL 6.3(L) 6.6 6.1(L)  Total Bilirubin 0.3 - 1.2 mg/dL 0.7 0.8 0.7  Alkaline Phos 38 - 126 U/L 441(H) 560(H) 565(H)  AST 15 - 41 U/L 59(H) 120(H) 76(H)  ALT 0 - 44 U/L 32 58(H) 38      RADIOGRAPHIC STUDIES: I have personally reviewed the radiological images as listed and agreed with the findings in the report. No results found.   ASSESSMENT & PLAN:  Tamara Wood is a 74 y.o. female with    1. Pancreatic Cancer,stage III,unresectable -[place], her work up showed intrahepatic and extrahepatic biliary dilatation. EGDwith biopsy ofduodenal mucosa and head of pancreasshowed no cancer cells at that time. Sheunderwent biliary drain placement by IRin 05/2020 which was removed in 09/2019.  -She underwent biopsy of pancreatic head mass on 08/10/19 which showedinvasive well differentiated adenocarcinoma,peripancreaticLN biopsy wasalso positive.  -She underwentWhipple Surgery on 08/29/19 but was aborted due to tumor invading the SMV. She instead hadgastrojejunostomy. Her tumor has invaded her mesentery and vascular structures around her pancreas, making her tumor unresectable. It is no longer curable in this case but still treatable to controlher disease and prolong her life.  -I started her on first line chemotherapy with Gemcitabine 2  weeks on/1 week off on 12/09/19. Abraxane was added with C2. She developed worsening anemia after C2D1, treatment changed to every 2 weeks with retacriton 01/12/20.  -She had PAC placed 12/08/19.  -S/p C3 her fatigue is mostly stable and mild N&V. Labs reviewed and adequate to proceed with C4 Gem/Abraxane with mild dose increase given good tolerance.  -Will proceed with cycle 5 before next CT scan in 3-4 weeks.  -We discussed role of consolidation radiation, which will be considered after 3 to 6 months.  She understands the radiation is palliative for disease control, not curative  -F/u in 2 weeks -She is interested in [place] booster. Plan to proceed in 2 weeks.   2. Anemia  -Secondary to chemo, onset after lower dose C2D1 Gem/Abraxane -Decreased treatment to every 2 weeks and started Retacrit every 2 weeks on 01/12/20  -She is responding, Hg improved from 8.2 to 10.5 today (8/26/210). Will continue retacrit q2weeks, can reduce dose.    2. Malnutrition nutrition and weight loss  -During her 08/29/19 surgery her whipple was aborted and instead had bypass of duodenum and CBD due to blockages.She also had J-tube placed at that time. She did use this after surgery but has not used since she was discharged from rehab. J tube removed 11/30/19. -She feels she eats adequately by mouth now, but due to low appetite she is losing weight.  -I encouraged her to increase nutritional supplement every 1-2 hours. She will continue to f/u with dietician. -Will monitor her nausea. Will consider dexamethasone for nausea and aid in appetite.    3. Comorbidities: HTN, DM, H/o of spinal tumor, Chronic back pain  -On Medication. She plans to see new PCP.  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 When she moved to [place] in 09/1989 she had back surgery and had it removed.  -Tylenol and Tramadol was no longer helping her pain. She was started on Norco (12/16/19) q6hours  which is controlling her pain well. I refilled today and discussed this is a controlled substance that has to be monitored closely.  -She has had recent onset of abdominal pain which is likely related to her pancreatic cancer. Norco should help. She will also  continue Prilosec.  -She has occasional hypotension and NP Lacie previously held her Metoprolol and continued Amlodipine. I recommend she restart Metoprolol 25mg  BID and stop Amlodipine for now (02/10/20). She can hold Metoprolol when her BP is low.   4. Social Support  -She is originally from [place] She moved to [place] in 09/1989. She is married with 4 children.  -After medical issues since late 05/2019 her son and his wife who are fluent in [place] moved to [place]. After cancer diagnosis, all of them moved to [place], [place] in early May given 3 of her children live in [place]. She has great family support  -She is a Jehovah Witness  5.Goal of care discussion  -The patient understands the goal of care is palliative. She is full code for now   PLAN: -Labs reviewed and adequate to proceed with C4 Gem/Abraxane with Gemcitabine dose increase  -Proceed with Retacrit with dose reduction  -Lab, flush, F/u and Gem/Abraxne in 2, 4, 6 weeks  -CT CAP w contrast in 3-4 weeks  -COVID vaccine booster in 2 weeks   No problem-specific Assessment & Plan notes found for this encounter.   Orders Placed This Encounter  Procedures  . CT Abdomen Pelvis W Contrast    Standing Status:   Future    Standing Expiration Date:   02/09/2021    Order Specific Question:   If indicated for the ordered procedure, I authorize the administration of contrast media per Radiology protocol    Answer:   Yes    Order Specific Question:   Preferred imaging location?    Answer:   [facility]    Order Specific Question:   Release to patient    Answer:   Immediate    Order Specific Question:   Is Oral Contrast requested for this exam?    Answer:    Yes, Per Radiology protocol    Order Specific Question:   Radiology Contrast Protocol - do NOT remove file path    Answer:   [email or web address]   All questions were answered. The patient knows to call the clinic with any problems, questions or concerns. No barriers to learning was detected. The total time spent in the appointment was 30 minutes.     Truitt Merle, MD 02/10/2020   I, Joslyn Devon, am acting as scribe for Truitt Merle, MD.   I have reviewed the above documentation for accuracy and completeness, and I agree with the above.

## 2020-02-10 ENCOUNTER — Inpatient Hospital Stay (HOSPITAL_BASED_OUTPATIENT_CLINIC_OR_DEPARTMENT_OTHER): Payer: 59 | Admitting: Hematology

## 2020-02-10 ENCOUNTER — Inpatient Hospital Stay: Payer: 59

## 2020-02-10 ENCOUNTER — Telehealth: Payer: Self-pay | Admitting: Hematology

## 2020-02-10 ENCOUNTER — Encounter: Payer: Self-pay | Admitting: Hematology

## 2020-02-10 ENCOUNTER — Other Ambulatory Visit: Payer: Self-pay

## 2020-02-10 VITALS — BP 131/72 | HR 65 | Temp 97.4°F | Resp 18 | Ht 64.0 in | Wt 120.0 lb

## 2020-02-10 DIAGNOSIS — C250 Malignant neoplasm of head of pancreas: Secondary | ICD-10-CM

## 2020-02-10 DIAGNOSIS — Z95828 Presence of other vascular implants and grafts: Secondary | ICD-10-CM

## 2020-02-10 DIAGNOSIS — Z7189 Other specified counseling: Secondary | ICD-10-CM

## 2020-02-10 DIAGNOSIS — Z5111 Encounter for antineoplastic chemotherapy: Secondary | ICD-10-CM | POA: Diagnosis not present

## 2020-02-10 LAB — CMP (CANCER CENTER ONLY)
ALT: 32 U/L (ref 0–44)
AST: 59 U/L — ABNORMAL HIGH (ref 15–41)
Albumin: 2.7 g/dL — ABNORMAL LOW (ref 3.5–5.0)
Alkaline Phosphatase: 441 U/L — ABNORMAL HIGH (ref 38–126)
Anion gap: 7 (ref 5–15)
BUN: 13 mg/dL (ref 8–23)
CO2: 29 mmol/L (ref 22–32)
Calcium: 9 mg/dL (ref 8.9–10.3)
Chloride: 103 mmol/L (ref 98–111)
Creatinine: 0.79 mg/dL (ref 0.44–1.00)
GFR, Est AFR Am: >60 mL/min (ref >60)
GFR, Estimated: >60 mL/min (ref >60)
Glucose, Bld: 161 mg/dL — ABNORMAL HIGH (ref 70–99)
Potassium: 3.9 mmol/L (ref 3.5–5.1)
Sodium: 139 mmol/L (ref 135–145)
Total Bilirubin: 0.7 mg/dL (ref 0.3–1.2)
Total Protein: 6.3 g/dL — ABNORMAL LOW (ref 6.5–8.1)

## 2020-02-10 LAB — CBC WITH DIFFERENTIAL (CANCER CENTER ONLY)
Abs Immature Granulocytes: 0.02 10*3/uL (ref 0.00–0.07)
Basophils Absolute: 0.1 10*3/uL (ref 0.0–0.1)
Basophils Relative: 2 %
Eosinophils Absolute: 0.1 10*3/uL (ref 0.0–0.5)
Eosinophils Relative: 1 %
HCT: 32.7 % — ABNORMAL LOW (ref 36.0–46.0)
Hemoglobin: 10.5 g/dL — ABNORMAL LOW (ref 12.0–15.0)
Immature Granulocytes: 1 %
Lymphocytes Relative: 20 %
Lymphs Abs: 0.8 10*3/uL (ref 0.7–4.0)
MCH: 32 pg (ref 26.0–34.0)
MCHC: 32.1 g/dL (ref 30.0–36.0)
MCV: 99.7 fL (ref 80.0–100.0)
Monocytes Absolute: 0.4 10*3/uL (ref 0.1–1.0)
Monocytes Relative: 10 %
Neutro Abs: 2.8 10*3/uL (ref 1.7–7.7)
Neutrophils Relative %: 66 %
Platelet Count: 346 10*3/uL (ref 150–400)
RBC: 3.28 MIL/uL — ABNORMAL LOW (ref 3.87–5.11)
RDW: 15.9 % — ABNORMAL HIGH (ref 11.5–15.5)
WBC Count: 4.2 10*3/uL (ref 4.0–10.5)
nRBC: 0 % (ref 0.0–0.2)

## 2020-02-10 MED ORDER — SODIUM CHLORIDE 0.9% FLUSH
10.0000 mL | INTRAVENOUS | Status: DC | PRN
  Administered 2020-02-10: 10 mL
  Filled 2020-02-10: qty 10

## 2020-02-10 MED ORDER — EPOETIN ALFA-EPBX 40000 UNIT/ML IJ SOLN
INTRAMUSCULAR | Status: AC
  Filled 2020-02-10: qty 1

## 2020-02-10 MED ORDER — PACLITAXEL PROTEIN-BOUND CHEMO INJECTION 100 MG
60.0000 mg/m2 | Freq: Once | INTRAVENOUS | Status: AC
  Administered 2020-02-10: 100 mg via INTRAVENOUS
  Filled 2020-02-10: qty 20

## 2020-02-10 MED ORDER — SODIUM CHLORIDE 0.9 % IV SOLN
Freq: Once | INTRAVENOUS | Status: AC
  Filled 2020-02-10: qty 250

## 2020-02-10 MED ORDER — HEPARIN SOD (PORK) LOCK FLUSH 100 UNIT/ML IV SOLN
500.0000 [IU] | Freq: Once | INTRAVENOUS | Status: AC | PRN
  Administered 2020-02-10: 500 [IU]
  Filled 2020-02-10: qty 5

## 2020-02-10 MED ORDER — SODIUM CHLORIDE 0.9 % IV SOLN
600.0000 mg/m2 | Freq: Once | INTRAVENOUS | Status: AC
  Administered 2020-02-10: 912 mg via INTRAVENOUS
  Filled 2020-02-10: qty 23.99

## 2020-02-10 MED ORDER — PROCHLORPERAZINE MALEATE 10 MG PO TABS
10.0000 mg | ORAL_TABLET | Freq: Once | ORAL | Status: AC
  Administered 2020-02-10: 10 mg via ORAL

## 2020-02-10 MED ORDER — EPOETIN ALFA-EPBX 40000 UNIT/ML IJ SOLN: 20000.0000 [IU] | Freq: Once | INTRAMUSCULAR | Status: DC

## 2020-02-10 MED ORDER — SODIUM CHLORIDE 0.9% FLUSH
10.0000 mL | Freq: Once | INTRAVENOUS | Status: AC
  Administered 2020-02-10: 10 mL
  Filled 2020-02-10: qty 10

## 2020-02-10 MED ORDER — PROCHLORPERAZINE MALEATE 10 MG PO TABS
ORAL_TABLET | ORAL | Status: AC
  Filled 2020-02-10: qty 1

## 2020-02-10 NOTE — Patient Instructions (Signed)

## 2020-02-10 NOTE — Patient Instructions (Signed)
[facility] Discharge Instructions for Patients Receiving Chemotherapy  Today you received the following chemotherapy agents Paclitaxel-protein (ABRAXANE) & Gemcitabine (GEMZAR).  To help prevent nausea and vomiting after your treatment, we encourage you to take your nausea medication as prescribed.   If you develop nausea and vomiting that is not controlled by your nausea medication, call the clinic.   BELOW ARE SYMPTOMS THAT SHOULD BE REPORTED IMMEDIATELY:  *FEVER GREATER THAN 100.5 F  *CHILLS WITH OR WITHOUT FEVER  NAUSEA AND VOMITING THAT IS NOT CONTROLLED WITH YOUR NAUSEA MEDICATION  *UNUSUAL SHORTNESS OF BREATH  *UNUSUAL BRUISING OR BLEEDING  TENDERNESS IN MOUTH AND THROAT WITH OR WITHOUT PRESENCE OF ULCERS  *URINARY PROBLEMS  *BOWEL PROBLEMS  UNUSUAL RASH Items with * indicate a potential emergency and should be followed up as soon as possible.  Feel free to call the clinic should you have any questions or concerns. The clinic phone number is [phone].  Please show the CHEMO ALERT CARD at check-in to the Emergency Department and triage nurse.   

## 2020-02-10 NOTE — Telephone Encounter (Signed)
Scheduled per 8/26 los. Pt is aware of appt times and dates. No avs or calendar needed to be printed. Pt is mychart active.

## 2020-02-20 ENCOUNTER — Other Ambulatory Visit: Payer: Self-pay | Admitting: Nurse Practitioner

## 2020-02-24 ENCOUNTER — Inpatient Hospital Stay: Payer: 59

## 2020-02-24 ENCOUNTER — Inpatient Hospital Stay: Payer: 59 | Attending: Hematology | Admitting: Nurse Practitioner

## 2020-02-24 ENCOUNTER — Other Ambulatory Visit: Payer: Self-pay

## 2020-02-24 ENCOUNTER — Encounter: Payer: Self-pay | Admitting: Nurse Practitioner

## 2020-02-24 VITALS — BP 144/84 | HR 79 | Temp 95.7°F | Resp 17 | Ht 64.0 in | Wt 121.0 lb

## 2020-02-24 DIAGNOSIS — Z95828 Presence of other vascular implants and grafts: Secondary | ICD-10-CM

## 2020-02-24 DIAGNOSIS — Z7984 Long term (current) use of oral hypoglycemic drugs: Secondary | ICD-10-CM | POA: Diagnosis not present

## 2020-02-24 DIAGNOSIS — C250 Malignant neoplasm of head of pancreas: Secondary | ICD-10-CM

## 2020-02-24 DIAGNOSIS — I10 Essential (primary) hypertension: Secondary | ICD-10-CM | POA: Insufficient documentation

## 2020-02-24 DIAGNOSIS — E46 Unspecified protein-calorie malnutrition: Secondary | ICD-10-CM | POA: Diagnosis not present

## 2020-02-24 DIAGNOSIS — Z79899 Other long term (current) drug therapy: Secondary | ICD-10-CM | POA: Diagnosis not present

## 2020-02-24 DIAGNOSIS — Z23 Encounter for immunization: Secondary | ICD-10-CM

## 2020-02-24 DIAGNOSIS — E119 Type 2 diabetes mellitus without complications: Secondary | ICD-10-CM | POA: Diagnosis not present

## 2020-02-24 DIAGNOSIS — D649 Anemia, unspecified: Secondary | ICD-10-CM | POA: Diagnosis not present

## 2020-02-24 DIAGNOSIS — Z7189 Other specified counseling: Secondary | ICD-10-CM

## 2020-02-24 DIAGNOSIS — M549 Dorsalgia, unspecified: Secondary | ICD-10-CM | POA: Diagnosis not present

## 2020-02-24 DIAGNOSIS — G8929 Other chronic pain: Secondary | ICD-10-CM | POA: Diagnosis not present

## 2020-02-24 DIAGNOSIS — Z5111 Encounter for antineoplastic chemotherapy: Secondary | ICD-10-CM | POA: Diagnosis present

## 2020-02-24 LAB — CBC WITH DIFFERENTIAL (CANCER CENTER ONLY)
Abs Immature Granulocytes: 0.01 10*3/uL (ref 0.00–0.07)
Basophils Absolute: 0 10*3/uL (ref 0.0–0.1)
Basophils Relative: 1 %
Eosinophils Absolute: 0 10*3/uL (ref 0.0–0.5)
Eosinophils Relative: 1 %
HCT: 30.3 % — ABNORMAL LOW (ref 36.0–46.0)
Hemoglobin: 10 g/dL — ABNORMAL LOW (ref 12.0–15.0)
Immature Granulocytes: 0 %
Lymphocytes Relative: 22 %
Lymphs Abs: 0.8 10*3/uL (ref 0.7–4.0)
MCH: 32.3 pg (ref 26.0–34.0)
MCHC: 33 g/dL (ref 30.0–36.0)
MCV: 97.7 fL (ref 80.0–100.0)
Monocytes Absolute: 0.4 10*3/uL (ref 0.1–1.0)
Monocytes Relative: 10 %
Neutro Abs: 2.4 10*3/uL (ref 1.7–7.7)
Neutrophils Relative %: 66 %
Platelet Count: 260 10*3/uL (ref 150–400)
RBC: 3.1 MIL/uL — ABNORMAL LOW (ref 3.87–5.11)
RDW: 15.3 % (ref 11.5–15.5)
WBC Count: 3.6 10*3/uL — ABNORMAL LOW (ref 4.0–10.5)
nRBC: 0 % (ref 0.0–0.2)

## 2020-02-24 LAB — CMP (CANCER CENTER ONLY)
ALT: 29 U/L (ref 0–44)
AST: 71 U/L — ABNORMAL HIGH (ref 15–41)
Albumin: 2.5 g/dL — ABNORMAL LOW (ref 3.5–5.0)
Alkaline Phosphatase: 375 U/L — ABNORMAL HIGH (ref 38–126)
Anion gap: 7 (ref 5–15)
BUN: 7 mg/dL — ABNORMAL LOW (ref 8–23)
CO2: 29 mmol/L (ref 22–32)
Calcium: 8.3 mg/dL — ABNORMAL LOW (ref 8.9–10.3)
Chloride: 104 mmol/L (ref 98–111)
Creatinine: 0.76 mg/dL (ref 0.44–1.00)
GFR, Est AFR Am: >60 mL/min (ref >60)
GFR, Estimated: >60 mL/min (ref >60)
Glucose, Bld: 195 mg/dL — ABNORMAL HIGH (ref 70–99)
Potassium: 3.6 mmol/L (ref 3.5–5.1)
Sodium: 140 mmol/L (ref 135–145)
Total Bilirubin: 0.6 mg/dL (ref 0.3–1.2)
Total Protein: 6 g/dL — ABNORMAL LOW (ref 6.5–8.1)

## 2020-02-24 MED ORDER — PROCHLORPERAZINE MALEATE 10 MG PO TABS
10.0000 mg | ORAL_TABLET | Freq: Once | ORAL | Status: AC
  Administered 2020-02-24: 10 mg via ORAL

## 2020-02-24 MED ORDER — PROCHLORPERAZINE MALEATE 10 MG PO TABS
ORAL_TABLET | ORAL | Status: AC
  Filled 2020-02-24: qty 1

## 2020-02-24 MED ORDER — EPOETIN ALFA-EPBX 40000 UNIT/ML IJ SOLN: 20000.0000 [IU] | Freq: Once | INTRAMUSCULAR | Status: DC

## 2020-02-24 MED ORDER — EPOETIN ALFA-EPBX 10000 UNIT/ML IJ SOLN
INTRAMUSCULAR | Status: AC
  Filled 2020-02-24: qty 2

## 2020-02-24 MED ORDER — SODIUM CHLORIDE 0.9% FLUSH
10.0000 mL | Freq: Once | INTRAVENOUS | Status: AC
  Administered 2020-02-24: 10 mL
  Filled 2020-02-24: qty 10

## 2020-02-24 MED ORDER — PACLITAXEL PROTEIN-BOUND CHEMO INJECTION 100 MG
60.0000 mg/m2 | Freq: Once | INTRAVENOUS | Status: AC
  Administered 2020-02-24: 100 mg via INTRAVENOUS
  Filled 2020-02-24: qty 20

## 2020-02-24 MED ORDER — SODIUM CHLORIDE 0.9 % IV SOLN
600.0000 mg/m2 | Freq: Once | INTRAVENOUS | Status: AC
  Administered 2020-02-24: 912 mg via INTRAVENOUS
  Filled 2020-02-24: qty 23.99

## 2020-02-24 MED ORDER — SODIUM CHLORIDE 0.9% FLUSH
10.0000 mL | INTRAVENOUS | Status: DC | PRN
  Administered 2020-02-24: 10 mL
  Filled 2020-02-24: qty 10

## 2020-02-24 MED ORDER — SODIUM CHLORIDE 0.9 % IV SOLN
Freq: Once | INTRAVENOUS | Status: AC
  Filled 2020-02-24: qty 250

## 2020-02-24 MED ORDER — HEPARIN SOD (PORK) LOCK FLUSH 100 UNIT/ML IV SOLN
500.0000 [IU] | Freq: Once | INTRAVENOUS | Status: AC | PRN
  Administered 2020-02-24: 500 [IU]
  Filled 2020-02-24: qty 5

## 2020-02-24 NOTE — Progress Notes (Signed)
[place]   [phone] [phone]   Clinic Follow up Note   Patient Care Team: Isaac Bliss, Rayford Halsted, MD as PCP - General (Internal Medicine) Jonnie Finner, RN as Oncology Nurse Navigator Truitt Merle, MD as Consulting Physician (Hematology) 02/24/2020  CHIEF COMPLAINT: Follow-up pancreas cancer  SUMMARY OF ONCOLOGIC HISTORY: Oncology History Overview Note  Cancer Staging Pancreatic cancer [facility]) Staging form: Exocrine Pancreas, AJCC 8th Edition - Clinical: Stage III (cT4, cN1, cM0) - Signed by Truitt Merle, MD on 11/30/2019    Pancreatic cancer ([place])  06/04/2019 -  Hospital Admission   Admit date: 06/04/2019 Admission diagnosis: biliary obstruction  Additional comments: Patient presented with right upper quadrant abdominal pain, jaundice.  She was admitted to [facility] in [place].  She was noticed to have intrahepatic and extrahepatic biliary dilatation along with main pancreatic duct dilatation on CT 1 months prior.  MRCP showed a possible slight progression of the biliary and pancreatic duct dilatation.  Patient was evaluated by GI, underwent EGD.  Biopsy of the duodenum and increase did not review malignant cells, biopsy of pancreatic head showed chronic pancreatitis.  Patient underwent biliary drain placement by IR.  She was discharged home on June 12, 2019.   06/07/2019 Procedure   EUS with biopsy by Dr. Glade Stanford at [facility], [place].  Impression: Partial obstruction of the duodenal and the second portion in the expected area of the ampulla.  The overlying mucosa on this edematous folds which were causing the obstruction appeared normal with the exception of one area which was biopsied.    06/10/2019 Pathology Results   Common bile duct for cytology: Negative for malignant cells. Biopsy head of pancreas: chronic Pancreatitis   06/10/2019 Procedure   IR placed a biliary drain for obstructive jaundice      07/30/2019 Imaging   CT abdomen and pelvis with contrast showed increased moderate distention of duodenal bulb without frank gastric distention.  Gallbladder absent, there is a stent in the low common bile duct without a significant dilatation.  There remains an external transhepatic biliary drain.  Increased to moderate dilatation of the pancreatic duct.  There is ill-defined fullness of the pancreatic head which is similar to prior exam.  The patient has underwent EUS aspiration and biliary stricture brushing without demonstration of tumor.  Pancreatic head the biopsy came back chronic pancreatitis.  Occult tumor remains possible.   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  Whipple procedure was planned but aborted because of him pancreatic mass was densely adherent to the mesentery vein.   11/21/2019 Initial Diagnosis   Pancreatic cancer ([place])   11/29/2019 Imaging   CT CAP W Contrast    IMPRESSION: 1. 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 2. Small lymph nodes in the retrocrural region on the LEFT largest 9 mm of uncertain significance. 3. Infiltration of the transverse mesocolon and extension of tumor into the root of the small bowel mesentery with abutment of SMV as well at the level of the first jejunal branch as described. 4. Profound hepatic steatosis. 5. Pneumobilia post stenting. 6. Signs of gastrojejunostomy and  jejunostomy tube placement.   11/30/2019 Cancer Staging   Staging form: Exocrine Pancreas, AJCC 8th  Edition - Clinical: Stage III (cT4, cN1, cM0) - Signed by Truitt Merle, MD on 11/30/2019   12/08/2019 Procedure   She had PAC placed 12/08/19.    12/09/2019 -  Chemotherapy   Gemcitabine 2 weeks on/1 week off starting 12/09/19. Added Abraxane with C2 at 50% dose and decreased Gemcitabine by 20%.   -She developed worsening anemia after C2D1, treatment changed to every 2 weeks with retacrit on 01/12/20.   -Gemcitabine dose increased and Retacrit dose decreased with C4 on 02/10/20.        CURRENT THERAPY:  Gemcitabine 2 weeks on/1 week offstarting 12/09/19.Added Abraxane with C2 at 50% dose and decreased Gemcitabine by 20%.             -She developed worsening anemia after C2D1, treatment changed to every 2 weeks with retacriton 01/12/20.              -Gemcitabine dose increased and Retacrit dose reduced with C4 on 02/10/20.    INTERVAL HISTORY: Tamara Wood returns for follow-up and treatment as scheduled.  She completed cycle 4 with Abraxane and dose-increased gemcitabine on 02/10/20.  She had less fatigue with this cycle, appetite remains adequate.  No nausea/vomiting.  Bowels moving normally.  She has stable intermittent lower abdominal discomfort.  Her back pain remains resolved.  No recent fever, chills, cough, chest pain, dyspnea, neuropathy, or other new concerns.   MEDICAL HISTORY:  Past Medical History:  Diagnosis Date  . Diabetes mellitus without complication ([place])   . Hypertension   . Pancreatic cancer [facility])     SURGICAL HISTORY: Past Surgical History:  Procedure Laterality Date  . APPENDECTOMY    . BACK SURGERY    . CHOLECYSTECTOMY    . IR GASTROSTOMY TUBE REMOVAL  11/30/2019  . IR IMAGING GUIDED PORT INSERTION  12/08/2019    I have reviewed the social history and family history with the patient and they are unchanged from previous note.  ALLERGIES:  has No Known Allergies.  MEDICATIONS:  Current Outpatient Medications  Medication Sig Dispense Refill  . acetaminophen  (TYLENOL) 500 MG tablet Take 500-1,000 mg by mouth daily as needed for moderate pain.    Marland Kitchen amLODipine (NORVASC) 5 MG tablet Take 1 tablet (5 mg total) by mouth daily. 90 tablet 1  . ASPIRIN LOW DOSE 81 MG EC tablet Take 81 mg by mouth daily.    . Calcium Carb-Cholecalciferol (CALCIUM/VITAMIN D) 500-200 MG-UNIT TABS Take 1 tablet by mouth in the morning and at bedtime.     Marland Kitchen glucose blood test strip 1 each by Other route in the morning and at bedtime. Dx E11.9  For OneTouch Ultra 2 100 each 3  . HYDROcodone-acetaminophen (NORCO/VICODIN) 5-325 MG tablet Take 1 tablet by mouth every 6 (six) hours as needed. 90 tablet 0  . lidocaine-prilocaine (EMLA) cream Apply 1 application topically as needed. 30 g 3  . Magnesium 250 MG TABS Take 1 tablet (250 mg total) by mouth daily. 90 tablet 1  . metFORMIN (GLUCOPHAGE) 1000 MG tablet Take 1 tablet (1,000 mg total) by mouth 2 (two) times daily. 180 tablet 0  . metoprolol tartrate (LOPRESSOR) 50 MG tablet Take 1 tablet (50 mg total) by mouth 2 (two) times daily. 180 tablet 1  . ondansetron (ZOFRAN) 8 MG tablet Take 1 tablet (8 mg total) by mouth 2 (two) times daily as needed (Nausea or vomiting). 30 tablet  1  . OneTouch Delica Lancets 59D MISC Use twice daily for glucose control, Dx E11.9 200 each 1  . pantoprazole (PROTONIX) 20 MG tablet Take 1 tablet (20 mg total) by mouth daily. 90 tablet 1  . prochlorperazine (COMPAZINE) 10 MG tablet Take 1 tablet (10 mg total) by mouth every 6 (six) hours as needed (Nausea or vomiting). 30 tablet 1  . vitamin B-12 (CYANOCOBALAMIN) 1000 MCG tablet Take 1 tablet (1,000 mcg total) by mouth daily. 90 tablet 1  . potassium chloride SA (KLOR-CON) 20 MEQ tablet Take 1 tablet (20 mEq total) by mouth daily. 30 tablet 1  . traMADol (ULTRAM) 50 MG tablet Take 0.5-1 tablets (25-50 mg total) by mouth every 6 (six) hours as needed. (Patient not taking: Reported on 12/25/2019) 30 tablet 0   No current facility-administered medications for  this visit.   Facility-Administered Medications Ordered in Other Visits  Medication Dose Route Frequency Provider Last Rate Last Admin  . gemcitabine (GEMZAR) 912 mg in sodium chloride 0.9 % 250 mL chemo infusion  600 mg/m2 (Order-Specific) Intravenous Once Truitt Merle, MD      . heparin lock flush 100 unit/mL  500 Units Intracatheter Once PRN Truitt Merle, MD      . PACLitaxel-protein bound (ABRAXANE) chemo infusion 100 mg  60 mg/m2 (Order-Specific) Intravenous Once Truitt Merle, MD 40 mL/hr at 02/24/20 1247 100 mg at 02/24/20 1247  . sodium chloride flush (NS) 0.9 % injection 10 mL  10 mL Intracatheter PRN Truitt Merle, MD        PHYSICAL EXAMINATION: ECOG PERFORMANCE STATUS: 1 - Symptomatic but completely ambulatory  Vitals:   02/24/20 1119  BP: (!) 144/84  Pulse: 79  Resp: 17  Temp: (!) 95.7 F (35.4 C)  SpO2: 100%   Filed Weights   02/24/20 1119  Weight: 121 lb (54.9 kg)    GENERAL:alert, no distress and comfortable SKIN: no rash to exposed skin  EYES:  sclera clear LUNGS:  normal breathing effort HEART: no lower extremity edema NEURO: alert & oriented x 3 with fluent speech PAC without erythema  LABORATORY DATA:  I have reviewed the data as listed CBC Latest Ref Rng & Units 02/24/2020 02/10/2020 01/27/2020  WBC 4.0 - 10.5 K/uL 3.6(L) 4.2 4.7  Hemoglobin 12.0 - 15.0 g/dL 10.0(L) 10.5(L) 9.5(L)  Hematocrit 36 - 46 % 30.3(L) 32.7(L) 29.5(L)  Platelets 150 - 400 K/uL 260 346 399     CMP Latest Ref Rng & Units 02/24/2020 02/10/2020 01/27/2020  Glucose 70 - 99 mg/dL 195(H) 161(H) 251(H)  BUN 8 - 23 mg/dL 7(L) 13 9  Creatinine 0.44 - 1.00 mg/dL 0.76 0.79 0.83  Sodium 135 - 145 mmol/L 140 139 137  Potassium 3.5 - 5.1 mmol/L 3.6 3.9 4.0  Chloride 98 - 111 mmol/L 104 103 102  CO2 22 - 32 mmol/L 29 29 24   Calcium 8.9 - 10.3 mg/dL 8.3(L) 9.0 8.8(L)  Total Protein 6.5 - 8.1 g/dL 6.0(L) 6.3(L) 6.6  Total Bilirubin 0.3 - 1.2 mg/dL 0.6 0.7 0.8  Alkaline Phos 38 - 126 U/L 375(H) 441(H)  560(H)  AST 15 - 41 U/L 71(H) 59(H) 120(H)  ALT 0 - 44 U/L 29 32 58(H)      RADIOGRAPHIC STUDIES: I have personally reviewed the radiological images as listed and agreed with the findings in the report. No results found.   ASSESSMENT & PLAN: Tamara Almonteis a 74 y.o.femalewith    1. Pancreatic Cancer,stage III,unresectable -She initially presented to hospital in [place] with  RUQ abdominal and jaundice. Her work up showed intrahepatic and extrahepatic biliary dilatation. EGDwith biopsy ofduodenal mucosa and head of pancreasshowed no cancer cells at that time. Sheunderwent biliary drain placement by IRin 05/2020 which was removed in 09/2019.  -She underwent biopsy of pancreatic head mass on 08/10/19 which showedinvasive well differentiated adenocarcinoma,peripancreaticLN biopsy wasalso positive.  -She underwentWhipple Surgery on 08/29/19 but was aborted due to tumor invading the SMV. She instead hadgastrojejunostomy and J-tube placementdue tobowelblockages. -Her tumor has invaded her mesentery and vascular structures around her pancreas, making her tumor unresectable. It is no longer curable in this case but still treatable to controlher disease and prolong her life.  -CT CAP from 11/29/19 showsno evidence of distant metastasis, butagain demonstrated locally advanced disease. Her baseline Ca 19.9 was elevated at 911 which is concerning for high tumor burden and developing distant metastasis.  -Given her age, Dr. Burr Medico recommended (and agreed upon by multidisciplinary team)systemic chemo with Gemcitabinewith or without Abraxane2 weeks on/1 week off for 3-6 months followed byconsolidationRadiation.  -goal is palliative -began single agentgemcitbaine on 01/08/20, she tolerated well with mild but well controlled nausea and fatigue/weakness. She recovered well and50% dose-reduced abraxane was added with C2. 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 -Chemo was dose reduced, changed to every 2 weeks, and Retacrit was added -With cycle 4 gemcitabine was increased and Retacrit was decreased, tolerating well  2. S/p fall 01/04/20, left foot/ankle injury Tripped getting up from chair, no LOC  -Xray in ED negative -mild swelling and bruising, can bear weight. Continuesupportive care and ambulate with a cane -Improving  3. Nutrition  -During her 08/29/19 surgery her whipple was aborted and instead had bypass of duodenum and CBD due to blockages.She also had J-tube placed at that time. -J tube removed 6/15 -using supplements, weightstabilized lately;continue follow-up with nutrition  4. Comorbidities: HTN, DM, H/o of spinal tumor, Chronic back pain -followed by PCP Dr. 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 When she moved to [place] in 09/1989 she had back surgery and had it removed.  -She haschroniclowback pain; her cancer related right back/flank pain improved with Norco -Resolved since adding Abraxane, she is likely responding to treatment  5. Social Support  -She is originally from [place] She moved to [place] in 09/1989. She is married with 4 children.  -After medical issues since late 05/2019 her son and his wife who are fluent in [place] moved to [place]. After cancer diagnosis, all of them moved to [place], [place] in early May given 3 of her children live in [place]. She has great family support  -She is a [employer] Witness, does not accept blood products  6. B/l leg pain  -doppler negative for DVT in 11/2019  7.Goal of care discussion  -The goal of care is palliative. -she  is full code for now  Disposition: Tamara Wood appears stable.  She completed 4 cycles of gemcitabine and Abraxane.  She tolerated a mild dose increase in the gemcitabine.  She continues to tolerate treatment well without clinical evidence of disease progression.    We reviewed her CBC and CMP from today.  The CA 19-9 fluctuates.  She will proceed with cycle 5 gemcitabine  and Abraxane and Retacrit today as planned.  She has a restaging CT scheduled on 9/20 with follow-up and next cycle on 9/23.    She will receive the COVID-19 booster injection today.  All questions were answered. The patient knows to call the clinic with any problems, questions or concerns. No barriers to learning was detected.     Alla Feeling, NP 02/24/20

## 2020-02-24 NOTE — Patient Instructions (Addendum)
[facility] Discharge Instructions for Patients Receiving Chemotherapy  Today you received the following chemotherapy agents: Abraxane and Gemzar  To help prevent nausea and vomiting after your treatment, we encourage you to take your nausea medication as prescribed.    If you develop nausea and vomiting that is not controlled by your nausea medication, call the clinic.   BELOW ARE SYMPTOMS THAT SHOULD BE REPORTED IMMEDIATELY:  *FEVER GREATER THAN 100.5 F  *CHILLS WITH OR WITHOUT FEVER  NAUSEA AND VOMITING THAT IS NOT CONTROLLED WITH YOUR NAUSEA MEDICATION  *UNUSUAL SHORTNESS OF BREATH  *UNUSUAL BRUISING OR BLEEDING  TENDERNESS IN MOUTH AND THROAT WITH OR WITHOUT PRESENCE OF ULCERS  *URINARY PROBLEMS  *BOWEL PROBLEMS  UNUSUAL RASH Items with * indicate a potential emergency and should be followed up as soon as possible.  Feel free to call the clinic should you have any questions or concerns. The clinic phone number is [phone].  Please show the CHEMO ALERT CARD at check-in to the Emergency Department and triage nurse.   

## 2020-02-24 NOTE — Progress Notes (Signed)
   Covid-19 Vaccination Clinic  Name:  [name]    MRN: [id number] DOB: 06/29/1945  02/24/2020  [name] was observed post Covid-19 immunization for [employer] booster without incident. She was provided with Vaccine Information Sheet and instruction to access the V-Safe system.   [name] was instructed to call 911 with any severe reactions post vaccine: Marland Kitchen Difficulty breathing  . Swelling of face and throat  . A fast heartbeat  . A bad rash all over body  . Dizziness and weakness

## 2020-02-25 ENCOUNTER — Telehealth: Payer: Self-pay | Admitting: Nurse Practitioner

## 2020-02-25 LAB — CANCER ANTIGEN 19-9: CA 19-9: 433 U/mL — ABNORMAL HIGH (ref 0–35)

## 2020-02-25 NOTE — Telephone Encounter (Signed)
No 9/9 los

## 2020-03-06 ENCOUNTER — Ambulatory Visit (HOSPITAL_COMMUNITY)
Admission: RE | Admit: 2020-03-06 | Discharge: 2020-03-06 | Disposition: A | Discharge status: Home / Self Care | Payer: 59 | Source: Ambulatory Visit | Attending: Hematology | Admitting: Hematology

## 2020-03-06 ENCOUNTER — Other Ambulatory Visit: Payer: Self-pay

## 2020-03-06 DIAGNOSIS — C250 Malignant neoplasm of head of pancreas: Secondary | ICD-10-CM | POA: Insufficient documentation

## 2020-03-06 MED ORDER — HEPARIN SOD (PORK) LOCK FLUSH 100 UNIT/ML IV SOLN
500.0000 [IU] | Freq: Once | INTRAVENOUS | Status: AC
  Administered 2020-03-06: 500 [IU] via INTRAVENOUS

## 2020-03-06 MED ORDER — IOHEXOL 300 MG/ML  SOLN
75.0000 mL | Freq: Once | INTRAMUSCULAR | Status: AC | PRN
  Administered 2020-03-06: 75 mL via INTRAVENOUS

## 2020-03-06 MED ORDER — HEPARIN SOD (PORK) LOCK FLUSH 100 UNIT/ML IV SOLN
INTRAVENOUS | Status: AC
  Filled 2020-03-06: qty 5

## 2020-03-06 NOTE — Progress Notes (Signed)
[place]   [phone] [phone]   Clinic Follow up Note   Patient Care Team: Isaac Bliss, Rayford Halsted, MD as PCP - General (Internal Medicine) Jonnie Finner, RN as Oncology Nurse Navigator Truitt Merle, MD as Consulting Physician (Hematology)  Date of Service:  03/09/2020  CHIEF COMPLAINT: F/u of Pancreatic Cancer  SUMMARY OF ONCOLOGIC HISTORY: Oncology History Overview Note  Cancer Staging Pancreatic cancer [facility]) Staging form: Exocrine Pancreas, AJCC 8th Edition - Clinical: Stage III (cT4, cN1, cM0) - Signed by Truitt Merle, MD on 11/30/2019    Pancreatic cancer ([place])  06/04/2019 -  Hospital Admission   Admit date: 06/04/2019 Admission diagnosis: biliary obstruction  Additional comments: Patient presented with right upper quadrant abdominal pain, jaundice.  She was admitted to [facility] in [place].  She was noticed to have intrahepatic and extrahepatic biliary dilatation along with main pancreatic duct dilatation on CT 1 months prior.  MRCP showed a possible slight progression of the biliary and pancreatic duct dilatation.  Patient was evaluated by GI, underwent EGD.  Biopsy of the duodenum and increase did not review malignant cells, biopsy of pancreatic head showed chronic pancreatitis.  Patient underwent biliary drain placement by IR.  She was discharged home on June 12, 2019.   06/07/2019 Procedure   EUS with biopsy by Dr. Glade Stanford at [facility], [place].  Impression: Partial obstruction of the duodenal and the second portion in the expected area of the ampulla.  The overlying mucosa on this edematous folds which were causing the obstruction appeared normal with the exception of one area which was biopsied.    06/10/2019 Pathology Results   Common bile duct for cytology: Negative for malignant cells. Biopsy head of pancreas: chronic Pancreatitis   06/10/2019 Procedure   IR placed a biliary drain for  obstructive jaundice    07/30/2019 Imaging   CT abdomen and pelvis with contrast showed increased moderate distention of duodenal bulb without frank gastric distention.  Gallbladder absent, there is a stent in the low common bile duct without a significant dilatation.  There remains an external transhepatic biliary drain.  Increased to moderate dilatation of the pancreatic duct.  There is ill-defined fullness of the pancreatic head which is similar to prior exam.  The patient has underwent EUS aspiration and biliary stricture brushing without demonstration of tumor.  Pancreatic head the biopsy came back chronic pancreatitis.  Occult tumor remains possible.   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  Whipple procedure was planned but aborted because of him pancreatic mass was densely adherent to the mesentery vein.   11/21/2019 Initial Diagnosis   Pancreatic cancer ([place])   11/29/2019 Imaging   CT CAP W Contrast    IMPRESSION: 1. 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 2. Small lymph nodes in the retrocrural region on the LEFT largest 9 mm of uncertain significance. 3. Infiltration of the transverse mesocolon and extension of tumor into the root of the small bowel mesentery with abutment of SMV as well at the level of the first jejunal branch as described. 4. Profound hepatic steatosis. 5. Pneumobilia post stenting.  6. Signs of gastrojejunostomy and jejunostomy tube placement.   11/30/2019 Cancer Staging   Staging form: Exocrine  Pancreas, AJCC 8th Edition - Clinical: Stage III (cT4, cN1, cM0) - Signed by Truitt Merle, MD on 11/30/2019   12/08/2019 Procedure   She had PAC placed 12/08/19.    12/09/2019 -  Chemotherapy   Gemcitabine 2 weeks on/1 week off starting 12/09/19. Added Abraxane with C2 at 50% dose and decreased Gemcitabine by 20%.   -She developed worsening anemia after C2D1, treatment changed to every 2 weeks with retacrit on 01/12/20.   -Gemcitabine dose increased and Retacrit dose decreased with C4 on 02/10/20.         CURRENT THERAPY:  Gemcitabine 2 weeks on/1 week offstarting 12/09/19.Added Abraxane with C2 at 50% dose and decreased Gemcitabine by 20%.             -She developed worsening anemia after C2D1, treatment changed to every 2 weeks with retacriton 01/12/20.              -Gemcitabine dose increased and Retacrit dose reduced with C4 on 02/10/20.    INTERVAL HISTORY:  Tamara Wood is here for a follow up. She presents to the clinic with her daughter-in-law. She has been tolerating chemo moderately well  Moderate fatigue, also got COVID last time, and was in bed ofr 3-4 days, improved afterwards  No pain, no N/V, BM normal  No neuropathy Not active at home, has mid balance issue, no fall. She is able to do ADLs, but not much other activities  No weight loss, ROS otherwise negative    MEDICAL HISTORY:  Past Medical History:  Diagnosis Date  . Diabetes mellitus without complication ([place])   . Hypertension   . Pancreatic cancer [facility])     SURGICAL HISTORY: Past Surgical History:  Procedure Laterality Date  . APPENDECTOMY    . BACK SURGERY    . CHOLECYSTECTOMY    . IR GASTROSTOMY TUBE REMOVAL  11/30/2019  . IR IMAGING GUIDED PORT INSERTION  12/08/2019    I have reviewed the social history and family history with the patient and they are unchanged from previous note.  ALLERGIES:  has No Known Allergies.  MEDICATIONS:  Current Outpatient Medications  Medication Sig Dispense Refill  .  acetaminophen (TYLENOL) 500 MG tablet Take 500-1,000 mg by mouth daily as needed for moderate pain.    Marland Kitchen amLODipine (NORVASC) 5 MG tablet Take 1 tablet (5 mg total) by mouth daily. 90 tablet 1  . ASPIRIN LOW DOSE 81 MG EC tablet Take 81 mg by mouth daily.    . Calcium Carb-Cholecalciferol (CALCIUM/VITAMIN D) 500-200 MG-UNIT TABS Take 1 tablet by mouth in the morning and at bedtime.     Marland Kitchen glucose blood test strip 1 each by Other route in the morning and at bedtime. Dx E11.9  For OneTouch Ultra 2 100 each 3  . HYDROcodone-acetaminophen (NORCO/VICODIN) 5-325 MG tablet Take 1 tablet by mouth every 6 (six) hours as needed. 90 tablet 0  . KLOR-CON M20 20 MEQ tablet TAKE 1 TABLET BY MOUTH EVERY DAY 30 tablet 1  . lidocaine-prilocaine (EMLA) cream Apply 1 application topically as needed. 30 g 3  . Magnesium 250 MG TABS Take 1 tablet (250 mg total) by mouth daily. 90 tablet 1  . metFORMIN (GLUCOPHAGE) 1000 MG tablet TAKE 1 TABLET BY MOUTH TWICE A DAY 180 tablet 0  . metoprolol tartrate (LOPRESSOR) 50 MG tablet Take 1 tablet (50 mg total) by mouth 2 (  two) times daily. 180 tablet 1  . ondansetron (ZOFRAN) 8 MG tablet Take 1 tablet (8 mg total) by mouth 2 (two) times daily as needed (Nausea or vomiting). 30 tablet 1  . OneTouch Delica Lancets 65K MISC Use twice daily for glucose control, Dx E11.9 200 each 1  . pantoprazole (PROTONIX) 20 MG tablet Take 1 tablet (20 mg total) by mouth daily. 90 tablet 1  . prochlorperazine (COMPAZINE) 10 MG tablet Take 1 tablet (10 mg total) by mouth every 6 (six) hours as needed (Nausea or vomiting). 30 tablet 1  . traMADol (ULTRAM) 50 MG tablet Take 0.5-1 tablets (25-50 mg total) by mouth every 6 (six) hours as needed. (Patient not taking: Reported on 12/25/2019) 30 tablet 0  . vitamin B-12 (CYANOCOBALAMIN) 1000 MCG tablet Take 1 tablet (1,000 mcg total) by mouth daily. 90 tablet 1   No current facility-administered medications for this visit.    PHYSICAL EXAMINATION: ECOG  PERFORMANCE STATUS: 2 - Symptomatic, <50% confined to bed  Vitals:   03/09/20 0848  BP: (!) 149/75  Pulse: 70  Resp: 18  Temp: (!) 96 F (35.6 C)  SpO2: 100%   Filed Weights   03/09/20 0848  Weight: 118 lb 6.4 oz (53.7 kg)   GENERAL:alert, no distress and comfortable SKIN: skin color, texture, turgor are normal, no rashes or significant lesions EYES: normal, Conjunctiva are pink and non-injected, sclera clear NECK: supple, thyroid normal size, non-tender, without nodularity LYMPH:  no palpable lymphadenopathy in the cervical, axillary  LUNGS: clear to auscultation and percussion with normal breathing effort HEART: regular rate & rhythm and no murmurs and no lower extremity edema ABDOMEN:abdomen soft, non-tender and normal bowel sounds Musculoskeletal:no cyanosis of digits and no clubbing  NEURO: alert & oriented x 3 with fluent speech, no focal motor/sensory deficits  LABORATORY DATA:  I have reviewed the data as listed CBC Latest Ref Rng & Units 03/09/2020 02/24/2020 02/10/2020  WBC 4.0 - 10.5 K/uL 4.2 3.6(L) 4.2  Hemoglobin 12.0 - 15.0 g/dL 10.2(L) 10.0(L) 10.5(L)  Hematocrit 36 - 46 % 29.9(L) 30.3(L) 32.7(L)  Platelets 150 - 400 K/uL 247 260 346     CMP Latest Ref Rng & Units 03/09/2020 02/24/2020 02/10/2020  Glucose 70 - 99 mg/dL 140(H) 195(H) 161(H)  BUN 8 - 23 mg/dL 7(L) 7(L) 13  Creatinine 0.44 - 1.00 mg/dL 0.78 0.76 0.79  Sodium 135 - 145 mmol/L 137 140 139  Potassium 3.5 - 5.1 mmol/L 4.0 3.6 3.9  Chloride 98 - 111 mmol/L 103 104 103  CO2 22 - 32 mmol/L 30 29 29   Calcium 8.9 - 10.3 mg/dL 8.1(L) 8.3(L) 9.0  Total Protein 6.5 - 8.1 g/dL 6.2(L) 6.0(L) 6.3(L)  Total Bilirubin 0.3 - 1.2 mg/dL 0.6 0.6 0.7  Alkaline Phos 38 - 126 U/L 409(H) 375(H) 441(H)  AST 15 - 41 U/L 66(H) 71(H) 59(H)  ALT 0 - 44 U/L 31 29 32      RADIOGRAPHIC STUDIES: I have personally reviewed the radiological images as listed and agreed with the findings in the report. No results found.    ASSESSMENT & PLAN:  Tamara Wood is a 74 y.o. female with   1. Pancreatic Cancer,stage III,unresectable She was diagnosed in 07/2019. Her biopsy of pancreatic head mass on 08/10/19 showedinvasive well differentiated adenocarcinoma,peripancreaticLN biopsy wasalso positive.  -She underwentWhipple Surgery on 08/29/19 but was aborted due to tumor invading the SMV. She instead hadgastrojejunostomy. Her tumor has invaded her mesentery and vascular structures around her pancreas, making her  tumor unresectable. It is no longer curable in this case but still treatable to controlher disease and prolong her life. -I started her on first line chemotherapy with Gemcitabine 2 weeks on/1 week off on 12/09/19. Abraxane was added with C2. She developed worsening anemia after C2D1, treatment changed to every 2 weeks with retacriton 01/12/20.  -I personally reviewed and discussed her CT AP from 03/06/20 which showed stable pancreatic mass, stable borderline portal hepatic and portacaval lymph nodes, no other distant metastasis. -She is tolerating chemotherapy moderately well, anemia has been stable, no significant neuropathy.  Plan to continue chemotherapy for additional 3 months, then consider consolidation RT. She is agreeable with the plan  -Lab reviewed, adequate for treatment, will proceed cycle #6 gemcitabine and Abraxane today   2. Anemia  -Secondary to chemo, onset after lower dose C2D1 Gem/Abraxane -Decreased treatment to every 2 weeks and started Retacrit every 2 weeks on 01/12/20  -hold of Hg>=10  3.Malnutrition nutritionand weight loss -She had J-tube placed 08/29/19-11/30/19. -For weight management continue nutritional supplement every 1-2 hours. She will continue to f/u with dietician. -Will consider dexamethasone for nausea and aid in appetite.  4. Comorbidities: HTN, DM, H/o of spinal tumor, Chronic back pain  -stable, f/u with PCP    5. Goal of care discussion, Social support   -The patient understands the goal of care is palliative.She is full code for now -She is originally from [place] She moved to [place] in 09/1989. She is married with 4 children. 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   No orders of the defined types were placed in this encounter.  All questions were answered. The patient knows to call the clinic with any problems, questions or concerns. No barriers to learning was detected. The total time spent in the appointment was 30 minutes.     Truitt Merle, MD 03/09/2020   I, Joslyn Devon, am acting as scribe for Truitt Merle, MD.   I have reviewed the above documentation for accuracy and completeness, and I agree with the above.

## 2020-03-08 ENCOUNTER — Other Ambulatory Visit: Payer: Self-pay | Admitting: Internal Medicine

## 2020-03-09 ENCOUNTER — Encounter: Payer: Self-pay | Admitting: Hematology

## 2020-03-09 ENCOUNTER — Inpatient Hospital Stay: Payer: 59

## 2020-03-09 ENCOUNTER — Other Ambulatory Visit: Payer: Self-pay

## 2020-03-09 ENCOUNTER — Inpatient Hospital Stay (HOSPITAL_BASED_OUTPATIENT_CLINIC_OR_DEPARTMENT_OTHER): Payer: 59 | Admitting: Hematology

## 2020-03-09 VITALS — BP 149/75 | HR 70 | Temp 96.0°F | Resp 18 | Ht 64.0 in | Wt 118.4 lb

## 2020-03-09 DIAGNOSIS — E119 Type 2 diabetes mellitus without complications: Secondary | ICD-10-CM

## 2020-03-09 DIAGNOSIS — C250 Malignant neoplasm of head of pancreas: Secondary | ICD-10-CM

## 2020-03-09 DIAGNOSIS — Z7189 Other specified counseling: Secondary | ICD-10-CM | POA: Diagnosis not present

## 2020-03-09 DIAGNOSIS — I10 Essential (primary) hypertension: Secondary | ICD-10-CM | POA: Diagnosis not present

## 2020-03-09 DIAGNOSIS — Z95828 Presence of other vascular implants and grafts: Secondary | ICD-10-CM

## 2020-03-09 DIAGNOSIS — Z5111 Encounter for antineoplastic chemotherapy: Secondary | ICD-10-CM | POA: Diagnosis not present

## 2020-03-09 LAB — CBC WITH DIFFERENTIAL (CANCER CENTER ONLY)
Abs Immature Granulocytes: 0.02 10*3/uL (ref 0.00–0.07)
Basophils Absolute: 0.1 10*3/uL (ref 0.0–0.1)
Basophils Relative: 1 %
Eosinophils Absolute: 0.1 10*3/uL (ref 0.0–0.5)
Eosinophils Relative: 2 %
HCT: 29.9 % — ABNORMAL LOW (ref 36.0–46.0)
Hemoglobin: 10.2 g/dL — ABNORMAL LOW (ref 12.0–15.0)
Immature Granulocytes: 1 %
Lymphocytes Relative: 35 %
Lymphs Abs: 1.5 10*3/uL (ref 0.7–4.0)
MCH: 32.3 pg (ref 26.0–34.0)
MCHC: 34.1 g/dL (ref 30.0–36.0)
MCV: 94.6 fL (ref 80.0–100.0)
Monocytes Absolute: 0.4 10*3/uL (ref 0.1–1.0)
Monocytes Relative: 10 %
Neutro Abs: 2.2 10*3/uL (ref 1.7–7.7)
Neutrophils Relative %: 51 %
Platelet Count: 247 10*3/uL (ref 150–400)
RBC: 3.16 MIL/uL — ABNORMAL LOW (ref 3.87–5.11)
RDW: 15.2 % (ref 11.5–15.5)
WBC Count: 4.2 10*3/uL (ref 4.0–10.5)
nRBC: 0 % (ref 0.0–0.2)

## 2020-03-09 LAB — CMP (CANCER CENTER ONLY)
ALT: 31 U/L (ref 0–44)
AST: 66 U/L — ABNORMAL HIGH (ref 15–41)
Albumin: 2.4 g/dL — ABNORMAL LOW (ref 3.5–5.0)
Alkaline Phosphatase: 409 U/L — ABNORMAL HIGH (ref 38–126)
Anion gap: 4 — ABNORMAL LOW (ref 5–15)
BUN: 7 mg/dL — ABNORMAL LOW (ref 8–23)
CO2: 30 mmol/L (ref 22–32)
Calcium: 8.1 mg/dL — ABNORMAL LOW (ref 8.9–10.3)
Chloride: 103 mmol/L (ref 98–111)
Creatinine: 0.78 mg/dL (ref 0.44–1.00)
GFR, Est AFR Am: >60 mL/min (ref >60)
GFR, Estimated: >60 mL/min (ref >60)
Glucose, Bld: 140 mg/dL — ABNORMAL HIGH (ref 70–99)
Potassium: 4 mmol/L (ref 3.5–5.1)
Sodium: 137 mmol/L (ref 135–145)
Total Bilirubin: 0.6 mg/dL (ref 0.3–1.2)
Total Protein: 6.2 g/dL — ABNORMAL LOW (ref 6.5–8.1)

## 2020-03-09 MED ORDER — SODIUM CHLORIDE 0.9 % IV SOLN
Freq: Once | INTRAVENOUS | Status: AC
  Filled 2020-03-09: qty 250

## 2020-03-09 MED ORDER — SODIUM CHLORIDE 0.9% FLUSH
10.0000 mL | INTRAVENOUS | Status: DC | PRN
  Administered 2020-03-09: 10 mL
  Filled 2020-03-09: qty 10

## 2020-03-09 MED ORDER — PROCHLORPERAZINE MALEATE 10 MG PO TABS
ORAL_TABLET | ORAL | Status: AC
  Filled 2020-03-09: qty 1

## 2020-03-09 MED ORDER — PROCHLORPERAZINE MALEATE 10 MG PO TABS
10.0000 mg | ORAL_TABLET | Freq: Once | ORAL | Status: AC
  Administered 2020-03-09: 10 mg via ORAL

## 2020-03-09 MED ORDER — SODIUM CHLORIDE 0.9% FLUSH
10.0000 mL | Freq: Once | INTRAVENOUS | Status: AC
  Administered 2020-03-09: 10 mL
  Filled 2020-03-09: qty 10

## 2020-03-09 MED ORDER — HEPARIN SOD (PORK) LOCK FLUSH 100 UNIT/ML IV SOLN
500.0000 [IU] | Freq: Once | INTRAVENOUS | Status: AC | PRN
  Administered 2020-03-09: 500 [IU]
  Filled 2020-03-09: qty 5

## 2020-03-09 MED ORDER — SODIUM CHLORIDE 0.9 % IV SOLN
600.0000 mg/m2 | Freq: Once | INTRAVENOUS | Status: AC
  Administered 2020-03-09: 912 mg via INTRAVENOUS
  Filled 2020-03-09: qty 23.99

## 2020-03-09 MED ORDER — PACLITAXEL PROTEIN-BOUND CHEMO INJECTION 100 MG
60.0000 mg/m2 | Freq: Once | INTRAVENOUS | Status: AC
  Administered 2020-03-09: 100 mg via INTRAVENOUS
  Filled 2020-03-09: qty 20

## 2020-03-09 NOTE — Progress Notes (Signed)
Potassium refilled as requested

## 2020-03-09 NOTE — Patient Instructions (Signed)
[facility] Discharge Instructions for Patients Receiving Chemotherapy  Today you received the following chemotherapy agents: Abraxane and Gemzar  To help prevent nausea and vomiting after your treatment, we encourage you to take your nausea medication as prescribed.    If you develop nausea and vomiting that is not controlled by your nausea medication, call the clinic.   BELOW ARE SYMPTOMS THAT SHOULD BE REPORTED IMMEDIATELY:  *FEVER GREATER THAN 100.5 F  *CHILLS WITH OR WITHOUT FEVER  NAUSEA AND VOMITING THAT IS NOT CONTROLLED WITH YOUR NAUSEA MEDICATION  *UNUSUAL SHORTNESS OF BREATH  *UNUSUAL BRUISING OR BLEEDING  TENDERNESS IN MOUTH AND THROAT WITH OR WITHOUT PRESENCE OF ULCERS  *URINARY PROBLEMS  *BOWEL PROBLEMS  UNUSUAL RASH Items with * indicate a potential emergency and should be followed up as soon as possible.  Feel free to call the clinic should you have any questions or concerns. The clinic phone number is [phone].  Please show the CHEMO ALERT CARD at check-in to the Emergency Department and triage nurse.   

## 2020-03-22 NOTE — Progress Notes (Signed)
[place]   Telephone:(336) [phone] Fax:(336) [phone]   Clinic Follow up Note   Patient Care Team: Isaac Bliss, Rayford Halsted, MD as PCP - General (Internal Medicine) Jonnie Finner, RN as Oncology Nurse Navigator Truitt Merle, MD as Consulting Physician (Hematology)  Date of Service:  03/23/2020  CHIEF COMPLAINT: F/u of Pancreatic Cancer  SUMMARY OF ONCOLOGIC HISTORY: Oncology History Overview Note  Cancer Staging Pancreatic cancer [facility]) Staging form: Exocrine Pancreas, AJCC 8th Edition - Clinical: Stage III (cT4, cN1, cM0) - Signed by Truitt Merle, MD on 11/30/2019    Pancreatic cancer ([place])  06/04/2019 -  Hospital Admission   Admit date: 06/04/2019 Admission diagnosis: biliary obstruction  Additional comments: Patient presented with right upper quadrant abdominal pain, jaundice.  She was admitted to [facility] in [place].  She was noticed to have intrahepatic and extrahepatic biliary dilatation along with main pancreatic duct dilatation on CT 1 months prior.  MRCP showed a possible slight progression of the biliary and pancreatic duct dilatation.  Patient was evaluated by GI, underwent EGD.  Biopsy of the duodenum and increase did not review malignant cells, biopsy of pancreatic head showed chronic pancreatitis.  Patient underwent biliary drain placement by IR.  She was discharged home on June 12, 2019.   06/07/2019 Procedure   EUS with biopsy by Dr. Glade Stanford at [facility], [place].  Impression: Partial obstruction of the duodenal and the second portion in the expected area of the ampulla.  The overlying mucosa on this edematous folds which were causing the obstruction appeared normal with the exception of one area which was biopsied.    06/10/2019 Pathology Results   Common bile duct for cytology: Negative for malignant cells. Biopsy head of pancreas: chronic Pancreatitis   06/10/2019 Procedure   IR placed a biliary drain for  obstructive jaundice    07/30/2019 Imaging   CT abdomen and pelvis with contrast showed increased moderate distention of duodenal bulb without frank gastric distention.  Gallbladder absent, there is a stent in the low common bile duct without a significant dilatation.  There remains an external transhepatic biliary drain.  Increased to moderate dilatation of the pancreatic duct.  There is ill-defined fullness of the pancreatic head which is similar to prior exam.  The patient has underwent EUS aspiration and biliary stricture brushing without demonstration of tumor.  Pancreatic head the biopsy came back chronic pancreatitis.  Occult tumor remains possible.   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  Whipple procedure was planned but aborted because of him pancreatic mass was densely adherent to the mesentery vein.   11/21/2019 Initial Diagnosis   Pancreatic cancer ([place])   11/29/2019 Imaging   CT CAP W Contrast    IMPRESSION: 1. 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 2. Small lymph nodes in the retrocrural region on the LEFT largest 9 mm of uncertain significance. 3. Infiltration of the transverse mesocolon and extension of tumor into the root of the small bowel mesentery with abutment of SMV as well at the level of the first jejunal branch as described. 4. Profound hepatic steatosis. 5. Pneumobilia post stenting.  6. Signs of gastrojejunostomy and jejunostomy tube placement.   11/30/2019 Cancer Staging   Staging form: Exocrine  Pancreas, AJCC 8th Edition - Clinical: Stage III (cT4, cN1, cM0) - Signed by Truitt Merle, MD on 11/30/2019   12/08/2019 Procedure   She had PAC placed 12/08/19.    12/09/2019 -  Chemotherapy   Gemcitabine 2 weeks on/1 week off starting 12/09/19. Added Abraxane with C2 at 50% dose and decreased Gemcitabine by 20%.   -She developed worsening anemia after C2D1, treatment changed to every 2 weeks with retacrit on 01/12/20.   -Gemcitabine dose increased and Retacrit dose decreased with C4 on 02/10/20.         CURRENT THERAPY:  Gemcitabine 2 weeks on/1 week offstarting 12/09/19.Added Abraxane with C2 at 50% dose and decreased Gemcitabine by 20%. -She developed worsening anemia after C2D1, treatment changed to every 2 weeks with retacriton 01/12/20.  -Gemcitabine dose increased and Retacrit dose reduced with C4 on 02/10/20.   INTERVAL HISTORY:  Tamara Wood is here for a follow up. She presents to the clinic with her daughter-in-law, who also served as a Spanish interpreter her.  She overall tolerated last cycle chemotherapy well, did have 1 day of nausea, and vomited twice.  She recovered well, has been eating reasonably well.  She has moderate fatigue, although overall better than before.  She is able to do all her ADLs and some light housework, she walks in the deck, does not go out much.  No fever, chills, bleeding, or other new complaints.  Weight is stable. She uses a crane, no recent fall.   All other systems were reviewed with the patient and are negative.  MEDICAL HISTORY:  Past Medical History:  Diagnosis Date  . Diabetes mellitus without complication ([place])   . Hypertension   . Pancreatic cancer [facility])     SURGICAL HISTORY: Past Surgical History:  Procedure Laterality Date  . APPENDECTOMY    . BACK SURGERY    . CHOLECYSTECTOMY    . IR GASTROSTOMY TUBE REMOVAL  11/30/2019  . IR IMAGING GUIDED PORT INSERTION  12/08/2019    I have reviewed the social history  and family history with the patient and they are unchanged from previous note.  ALLERGIES:  has No Known Allergies.  MEDICATIONS:  Current Outpatient Medications  Medication Sig Dispense Refill  . acetaminophen (TYLENOL) 500 MG tablet Take 500-1,000 mg by mouth daily as needed for moderate pain.    Marland Kitchen amLODipine (NORVASC) 5 MG tablet Take 1 tablet (5 mg total) by mouth daily. 90 tablet 1  . ASPIRIN LOW DOSE 81 MG EC tablet Take 81 mg by mouth daily.    . Calcium Carb-Cholecalciferol (CALCIUM/VITAMIN D) 500-200 MG-UNIT TABS Take 1 tablet by mouth in the morning and at bedtime.     Marland Kitchen glucose blood test strip 1 each by Other route in the morning and at bedtime. Dx E11.9  For OneTouch Ultra 2 100 each 3  . HYDROcodone-acetaminophen (NORCO/VICODIN) 5-325 MG tablet Take 1 tablet by mouth every 6 (six) hours as needed. 90 tablet 0  . KLOR-CON M20 20 MEQ tablet TAKE 1 TABLET BY MOUTH EVERY DAY 30 tablet 1  . lidocaine-prilocaine (EMLA) cream Apply 1 application topically as needed. 30 g 3  . Magnesium 250 MG TABS Take 1 tablet (250 mg total) by mouth daily. 90 tablet 1  . metFORMIN (GLUCOPHAGE) 1000 MG tablet TAKE 1 TABLET BY MOUTH TWICE A DAY 180 tablet 0  . metoprolol tartrate (LOPRESSOR) 50  MG tablet Take 1 tablet (50 mg total) by mouth 2 (two) times daily. 180 tablet 1  . ondansetron (ZOFRAN) 8 MG tablet Take 1 tablet (8 mg total) by mouth 2 (two) times daily as needed (Nausea or vomiting). 30 tablet 1  . OneTouch Delica Lancets 29B MISC Use twice daily for glucose control, Dx E11.9 200 each 1  . pantoprazole (PROTONIX) 20 MG tablet Take 1 tablet (20 mg total) by mouth daily. 90 tablet 1  . prochlorperazine (COMPAZINE) 10 MG tablet Take 1 tablet (10 mg total) by mouth every 6 (six) hours as needed (Nausea or vomiting). 30 tablet 1  . traMADol (ULTRAM) 50 MG tablet Take 0.5-1 tablets (25-50 mg total) by mouth every 6 (six) hours as needed. (Patient not taking: Reported on 12/25/2019) 30 tablet 0  .  vitamin B-12 (CYANOCOBALAMIN) 1000 MCG tablet Take 1 tablet (1,000 mcg total) by mouth daily. 90 tablet 1   No current facility-administered medications for this visit.    PHYSICAL EXAMINATION: ECOG PERFORMANCE STATUS: 2 - Symptomatic, <50% confined to bed  Vitals:   03/23/20 1051  BP: 140/78  Pulse: 72  Resp: 20  Temp: 97.6 F (36.4 C)  SpO2: 100%   Filed Weights   03/23/20 1051  Weight: 118 lb 1.6 oz (53.6 kg)    GENERAL:alert, no distress and comfortable SKIN: skin color, texture, turgor are normal, no rashes or significant lesions EYES: normal, Conjunctiva are pink and non-injected, sclera clear NECK: supple, thyroid normal size, non-tender, without nodularity LYMPH:  no palpable lymphadenopathy in the cervical, axillary  LUNGS: clear to auscultation and percussion with normal breathing effort HEART: regular rate & rhythm and no murmurs and no lower extremity edema ABDOMEN:abdomen soft, (+) tenderness at epigastric area, no organomegaly or palpable mass, normal bowel sounds Musculoskeletal:no cyanosis of digits and no clubbing  NEURO: alert & oriented x 3 with fluent speech, no focal motor/sensory deficits  LABORATORY DATA:  I have reviewed the data as listed CBC Latest Ref Rng & Units 03/23/2020 03/09/2020 02/24/2020  WBC 4.0 - 10.5 K/uL 3.8(L) 4.2 3.6(L)  Hemoglobin 12.0 - 15.0 g/dL 10.9(L) 10.2(L) 10.0(L)  Hematocrit 36 - 46 % 33.1(L) 29.9(L) 30.3(L)  Platelets 150 - 400 K/uL 259 247 260     CMP Latest Ref Rng & Units 03/23/2020 03/09/2020 02/24/2020  Glucose 70 - 99 mg/dL 177(H) 140(H) 195(H)  BUN 8 - 23 mg/dL 8 7(L) 7(L)  Creatinine 0.44 - 1.00 mg/dL 0.80 0.78 0.76  Sodium 135 - 145 mmol/L 137 137 140  Potassium 3.5 - 5.1 mmol/L 4.4 4.0 3.6  Chloride 98 - 111 mmol/L 101 103 104  CO2 22 - 32 mmol/L 30 30 29   Calcium 8.9 - 10.3 mg/dL 8.5(L) 8.1(L) 8.3(L)  Total Protein 6.5 - 8.1 g/dL 6.6 6.2(L) 6.0(L)  Total Bilirubin 0.3 - 1.2 mg/dL 0.6 0.6 0.6  Alkaline Phos 38  - 126 U/L 397(H) 409(H) 375(H)  AST 15 - 41 U/L 66(H) 66(H) 71(H)  ALT 0 - 44 U/L 32 31 29      RADIOGRAPHIC STUDIES: I have personally reviewed the radiological images as listed and agreed with the findings in the report. No results found.   ASSESSMENT & PLAN:  Tamara Wood is a 74 y.o. female with    1. Pancreatic Cancer,stage III,unresectable -She was diagnosed in 07/2019. Her biopsy of pancreatic head mass on 08/10/19 showedinvasive well differentiated adenocarcinoma,peripancreaticLN biopsy wasalso positive.  -She underwentWhipple Surgery on 08/29/19 but was aborted due to tumor invading  the SMV. She instead hadgastrojejunostomy. Her tumor has invaded her mesentery and vascular structures around her pancreas, making her tumor unresectable. It is no longer curable in this case but still treatable to controlher disease and prolong her life. -I started her on first line chemotherapy with Gemcitabine 2 weeks on/1 week off on6/24/21. Abraxane was added with C2.She developed worsening anemia after C2D1, treatment changed to every 2 weeks with retacriton 01/12/20. -Restaging scan on March 06, 2020 showed stable disease, no new metastasis. -Plan to continue immunotherapy continue chemo until the end of this year, if next restaging scan shows no disease progression, will consider consolidation radiation. -She tolerated chemotherapy well, lab reviewed, hemoglobin 10.9, she has not tried Retacrit daily, or increased gemcitabine dose slightly from 600 mg to 700 mg/m2, and keep Abraxane at same dose  -Her tumor marker CA 19.9 has been trending down, indicating good response to treatment -f/u in 2 weeks    2. Anemia  -Secondary to chemo, onset after lower dose C2D1 Gem/Abraxane -Decreased treatment to every 2 weeks and started Retacrit every 2 weeks on 01/12/20, she only required 2 doses -Hold if Hg>=10  3.Malnutrition nutritionand weight loss -She had J-tube placed  08/29/19-11/30/19. -For weight management continue nutritional supplement every 1-2 hours.She will continue to f/u with dietician. -Overall improved, weight is stable, she is eating better  4. Comorbidities: HTN, DM, H/o of spinal tumor, Chronic back pain  -stable, f/u with PCP   5. Goal of care discussion, Social support  -The patient understands the goal of care is palliative.She is full code for now -She is originally from [place] She moved to [place] in 09/1989. She is married with 4 children. She is a Jehovah Witness   PLAN: -Lab reviewed, adequate for treatment, will slightly increase gemcitabine dose to 700 mg/m today, continue Abraxane at the same dose, no need G-CSF for this cycle -Lab, flush, follow-up and chemo in 2 weeks    No problem-specific Assessment & Plan notes found for this encounter.   No orders of the defined types were placed in this encounter.  All questions were answered. The patient knows to call the clinic with any problems, questions or concerns. No barriers to learning was detected. The total time spent in the appointment was 30 minutes.     Truitt Merle, MD 03/23/2020   I, Joslyn Devon, am acting as scribe for Truitt Merle, MD.   I have reviewed the above documentation for accuracy and completeness, and I agree with the above.

## 2020-03-23 ENCOUNTER — Inpatient Hospital Stay: Payer: 59

## 2020-03-23 ENCOUNTER — Inpatient Hospital Stay: Payer: 59 | Attending: Hematology

## 2020-03-23 ENCOUNTER — Telehealth: Payer: Self-pay | Admitting: Hematology

## 2020-03-23 ENCOUNTER — Inpatient Hospital Stay (HOSPITAL_BASED_OUTPATIENT_CLINIC_OR_DEPARTMENT_OTHER): Payer: 59 | Admitting: Hematology

## 2020-03-23 ENCOUNTER — Encounter: Payer: Self-pay | Admitting: Hematology

## 2020-03-23 ENCOUNTER — Other Ambulatory Visit: Payer: Self-pay

## 2020-03-23 VITALS — BP 140/78 | HR 72 | Temp 97.6°F | Resp 20 | Ht 64.0 in | Wt 118.1 lb

## 2020-03-23 DIAGNOSIS — E119 Type 2 diabetes mellitus without complications: Secondary | ICD-10-CM | POA: Insufficient documentation

## 2020-03-23 DIAGNOSIS — Z95828 Presence of other vascular implants and grafts: Secondary | ICD-10-CM

## 2020-03-23 DIAGNOSIS — E46 Unspecified protein-calorie malnutrition: Secondary | ICD-10-CM | POA: Diagnosis not present

## 2020-03-23 DIAGNOSIS — Z79899 Other long term (current) drug therapy: Secondary | ICD-10-CM | POA: Insufficient documentation

## 2020-03-23 DIAGNOSIS — Z7984 Long term (current) use of oral hypoglycemic drugs: Secondary | ICD-10-CM | POA: Insufficient documentation

## 2020-03-23 DIAGNOSIS — M549 Dorsalgia, unspecified: Secondary | ICD-10-CM | POA: Diagnosis not present

## 2020-03-23 DIAGNOSIS — Z7189 Other specified counseling: Secondary | ICD-10-CM

## 2020-03-23 DIAGNOSIS — I10 Essential (primary) hypertension: Secondary | ICD-10-CM | POA: Insufficient documentation

## 2020-03-23 DIAGNOSIS — D6481 Anemia due to antineoplastic chemotherapy: Secondary | ICD-10-CM | POA: Insufficient documentation

## 2020-03-23 DIAGNOSIS — G8929 Other chronic pain: Secondary | ICD-10-CM | POA: Insufficient documentation

## 2020-03-23 DIAGNOSIS — C250 Malignant neoplasm of head of pancreas: Secondary | ICD-10-CM

## 2020-03-23 DIAGNOSIS — T451X5S Adverse effect of antineoplastic and immunosuppressive drugs, sequela: Secondary | ICD-10-CM | POA: Insufficient documentation

## 2020-03-23 DIAGNOSIS — Z5111 Encounter for antineoplastic chemotherapy: Secondary | ICD-10-CM | POA: Insufficient documentation

## 2020-03-23 LAB — CMP (CANCER CENTER ONLY)
ALT: 32 U/L (ref 0–44)
AST: 66 U/L — ABNORMAL HIGH (ref 15–41)
Albumin: 2.5 g/dL — ABNORMAL LOW (ref 3.5–5.0)
Alkaline Phosphatase: 397 U/L — ABNORMAL HIGH (ref 38–126)
Anion gap: 6 (ref 5–15)
BUN: 8 mg/dL (ref 8–23)
CO2: 30 mmol/L (ref 22–32)
Calcium: 8.5 mg/dL — ABNORMAL LOW (ref 8.9–10.3)
Chloride: 101 mmol/L (ref 98–111)
Creatinine: 0.8 mg/dL (ref 0.44–1.00)
GFR, Estimated: >60 mL/min (ref >60)
Glucose, Bld: 177 mg/dL — ABNORMAL HIGH (ref 70–99)
Potassium: 4.4 mmol/L (ref 3.5–5.1)
Sodium: 137 mmol/L (ref 135–145)
Total Bilirubin: 0.6 mg/dL (ref 0.3–1.2)
Total Protein: 6.6 g/dL (ref 6.5–8.1)

## 2020-03-23 LAB — CBC WITH DIFFERENTIAL (CANCER CENTER ONLY)
Abs Immature Granulocytes: 0.01 10*3/uL (ref 0.00–0.07)
Basophils Absolute: 0 10*3/uL (ref 0.0–0.1)
Basophils Relative: 1 %
Eosinophils Absolute: 0 10*3/uL (ref 0.0–0.5)
Eosinophils Relative: 1 %
HCT: 33.1 % — ABNORMAL LOW (ref 36.0–46.0)
Hemoglobin: 10.9 g/dL — ABNORMAL LOW (ref 12.0–15.0)
Immature Granulocytes: 0 %
Lymphocytes Relative: 19 %
Lymphs Abs: 0.7 10*3/uL (ref 0.7–4.0)
MCH: 32.5 pg (ref 26.0–34.0)
MCHC: 32.9 g/dL (ref 30.0–36.0)
MCV: 98.8 fL (ref 80.0–100.0)
Monocytes Absolute: 0.3 10*3/uL (ref 0.1–1.0)
Monocytes Relative: 9 %
Neutro Abs: 2.7 10*3/uL (ref 1.7–7.7)
Neutrophils Relative %: 70 %
Platelet Count: 259 10*3/uL (ref 150–400)
RBC: 3.35 MIL/uL — ABNORMAL LOW (ref 3.87–5.11)
RDW: 15.8 % — ABNORMAL HIGH (ref 11.5–15.5)
WBC Count: 3.8 10*3/uL — ABNORMAL LOW (ref 4.0–10.5)
nRBC: 0 % (ref 0.0–0.2)

## 2020-03-23 MED ORDER — PROCHLORPERAZINE MALEATE 10 MG PO TABS
ORAL_TABLET | ORAL | Status: AC
  Filled 2020-03-23: qty 1

## 2020-03-23 MED ORDER — SODIUM CHLORIDE 0.9% FLUSH
10.0000 mL | Freq: Once | INTRAVENOUS | Status: DC
  Filled 2020-03-23: qty 10

## 2020-03-23 MED ORDER — SODIUM CHLORIDE 0.9% FLUSH
10.0000 mL | INTRAVENOUS | Status: DC | PRN
  Administered 2020-03-23: 10 mL
  Filled 2020-03-23: qty 10

## 2020-03-23 MED ORDER — ALTEPLASE 2 MG IJ SOLR
INTRAMUSCULAR | Status: AC
  Filled 2020-03-23: qty 2

## 2020-03-23 MED ORDER — PACLITAXEL PROTEIN-BOUND CHEMO INJECTION 100 MG
60.0000 mg/m2 | Freq: Once | INTRAVENOUS | Status: AC
  Administered 2020-03-23: 100 mg via INTRAVENOUS
  Filled 2020-03-23: qty 20

## 2020-03-23 MED ORDER — ALTEPLASE 2 MG IJ SOLR
2.0000 mg | Freq: Once | INTRAMUSCULAR | Status: AC
  Administered 2020-03-23: 2 mg
  Filled 2020-03-23: qty 2

## 2020-03-23 MED ORDER — PROCHLORPERAZINE MALEATE 10 MG PO TABS
10.0000 mg | ORAL_TABLET | Freq: Once | ORAL | Status: AC
  Administered 2020-03-23: 10 mg via ORAL

## 2020-03-23 MED ORDER — SODIUM CHLORIDE 0.9 % IV SOLN
Freq: Once | INTRAVENOUS | Status: AC
  Filled 2020-03-23: qty 250

## 2020-03-23 MED ORDER — SODIUM CHLORIDE 0.9 % IV SOLN
700.0000 mg/m2 | Freq: Once | INTRAVENOUS | Status: AC
  Administered 2020-03-23: 1064 mg via INTRAVENOUS
  Filled 2020-03-23: qty 27.98

## 2020-03-23 MED ORDER — HEPARIN SOD (PORK) LOCK FLUSH 100 UNIT/ML IV SOLN
500.0000 [IU] | Freq: Once | INTRAVENOUS | Status: AC | PRN
  Administered 2020-03-23: 500 [IU]
  Filled 2020-03-23: qty 5

## 2020-03-23 NOTE — Patient Instructions (Signed)

## 2020-03-23 NOTE — Telephone Encounter (Signed)
Scheduled per 10/7 los. Noted to give pt appt calendar during tx.

## 2020-03-23 NOTE — Patient Instructions (Signed)
[facility] °Discharge Instructions for Patients Receiving Chemotherapy ° °Today you received the following chemotherapy agents Paclitaxel-protein bound (ABRAXANE) & Gemcitabine (GEMZAR). ° °To help prevent nausea and vomiting after your treatment, we encourage you to take your nausea medication as prescribed. °  °If you develop nausea and vomiting that is not controlled by your nausea medication, call the clinic.  ° °BELOW ARE SYMPTOMS THAT SHOULD BE REPORTED IMMEDIATELY: °· *FEVER GREATER THAN 100.5 F °· *CHILLS WITH OR WITHOUT FEVER °· NAUSEA AND VOMITING THAT IS NOT CONTROLLED WITH YOUR NAUSEA MEDICATION °· *UNUSUAL SHORTNESS OF BREATH °· *UNUSUAL BRUISING OR BLEEDING °· TENDERNESS IN MOUTH AND THROAT WITH OR WITHOUT PRESENCE OF ULCERS °· *URINARY PROBLEMS °· *BOWEL PROBLEMS °· UNUSUAL RASH °Items with * indicate a potential emergency and should be followed up as soon as possible. ° °Feel free to call the clinic should you have any questions or concerns. The clinic phone number is [phone]. ° °Please show the CHEMO ALERT CARD at check-in to the Emergency Department and triage nurse. ° ° °

## 2020-03-24 LAB — CANCER ANTIGEN 19-9: CA 19-9: 511 U/mL — ABNORMAL HIGH (ref 0–35)

## 2020-03-31 ENCOUNTER — Encounter: Payer: Self-pay | Admitting: Internal Medicine

## 2020-03-31 ENCOUNTER — Ambulatory Visit (INDEPENDENT_AMBULATORY_CARE_PROVIDER_SITE_OTHER): Payer: 59 | Admitting: Internal Medicine

## 2020-03-31 ENCOUNTER — Other Ambulatory Visit: Payer: Self-pay

## 2020-03-31 VITALS — BP 120/74 | HR 79 | Temp 97.7°F | Ht 64.0 in | Wt 118.4 lb

## 2020-03-31 DIAGNOSIS — I10 Essential (primary) hypertension: Secondary | ICD-10-CM

## 2020-03-31 DIAGNOSIS — E119 Type 2 diabetes mellitus without complications: Secondary | ICD-10-CM

## 2020-03-31 DIAGNOSIS — C250 Malignant neoplasm of head of pancreas: Secondary | ICD-10-CM | POA: Diagnosis not present

## 2020-03-31 DIAGNOSIS — Z23 Encounter for immunization: Secondary | ICD-10-CM | POA: Diagnosis not present

## 2020-03-31 DIAGNOSIS — K649 Unspecified hemorrhoids: Secondary | ICD-10-CM

## 2020-03-31 LAB — POCT GLYCOSYLATED HEMOGLOBIN (HGB A1C): Hemoglobin A1C: 6.1 % — AB (ref 4.0–5.6)

## 2020-03-31 MED ORDER — HYDROCORTISONE (PERIANAL) 2.5 % EX CREA: 1.0000 "application " | TOPICAL_CREAM | Freq: Two times a day (BID) | CUTANEOUS | 0 refills | Status: DC

## 2020-03-31 NOTE — Progress Notes (Signed)
Established Patient Office Visit     This visit occurred during the SARS-CoV-2 public health emergency.  Safety protocols were in place, including screening questions prior to the visit, additional usage of staff PPE, and extensive cleaning of exam room while observing appropriate contact time as indicated for disinfecting solutions.    CC/Reason for Visit: Follow-up chronic conditions  HPI: Tamara Wood is a 74 y.o. female who is coming in today for the above mentioned reasons. Past Medical History is significant for: Pancreatic cancer under the care of oncology currently undergoing chemotherapy, also history of well-controlled type 2 diabetes, hypertension and GERD.  She has been doing well, has been tolerating chemo okay, she has been having difficulty gaining weight although her appetite has been good.  Over the past couple weeks she has noticed some external hemorrhoids.  They are not painful, they are not bleeding.   Past Medical/Surgical History: Past Medical History:  Diagnosis Date  . Diabetes mellitus without complication ([place])   . Hypertension   . Pancreatic cancer [facility])     Past Surgical History:  Procedure Laterality Date  . APPENDECTOMY    . BACK SURGERY    . CHOLECYSTECTOMY    . IR GASTROSTOMY TUBE REMOVAL  11/30/2019  . IR IMAGING GUIDED PORT INSERTION  12/08/2019    Social History:  reports that she has never smoked. She has never used smokeless tobacco. She reports that she does not drink alcohol and does not use drugs.  Allergies: No Known Allergies  Family History:  Family History  Problem Relation Age of Onset  . Diabetes Mother   . Diabetes Sister   . Diabetes Brother      Current Outpatient Medications:  .  acetaminophen (TYLENOL) 500 MG tablet, Take 500-1,000 mg by mouth daily as needed for moderate pain., Disp: , Rfl:  .  amLODipine (NORVASC) 5 MG tablet, Take 1 tablet (5 mg total) by mouth daily., Disp: 90 tablet, Rfl: 1 .  ASPIRIN LOW  DOSE 81 MG EC tablet, Take 81 mg by mouth daily., Disp: , Rfl:  .  Calcium Carb-Cholecalciferol (CALCIUM/VITAMIN D) 500-200 MG-UNIT TABS, Take 1 tablet by mouth in the morning and at bedtime. , Disp: , Rfl:  .  glucose blood test strip, 1 each by Other route in the morning and at bedtime. Dx E11.9  For OneTouch Ultra 2, Disp: 100 each, Rfl: 3 .  HYDROcodone-acetaminophen (NORCO/VICODIN) 5-325 MG tablet, Take 1 tablet by mouth every 6 (six) hours as needed., Disp: 90 tablet, Rfl: 0 .  KLOR-CON M20 20 MEQ tablet, TAKE 1 TABLET BY MOUTH EVERY DAY, Disp: 30 tablet, Rfl: 1 .  lidocaine-prilocaine (EMLA) cream, Apply 1 application topically as needed., Disp: 30 g, Rfl: 3 .  Magnesium 250 MG TABS, Take 1 tablet (250 mg total) by mouth daily., Disp: 90 tablet, Rfl: 1 .  metFORMIN (GLUCOPHAGE) 1000 MG tablet, TAKE 1 TABLET BY MOUTH TWICE A DAY, Disp: 180 tablet, Rfl: 0 .  metoprolol tartrate (LOPRESSOR) 50 MG tablet, Take 1 tablet (50 mg total) by mouth 2 (two) times daily., Disp: 180 tablet, Rfl: 1 .  ondansetron (ZOFRAN) 8 MG tablet, Take 1 tablet (8 mg total) by mouth 2 (two) times daily as needed (Nausea or vomiting)., Disp: 30 tablet, Rfl: 1 .  OneTouch Delica Lancets 50K MISC, Use twice daily for glucose control, Dx E11.9, Disp: 200 each, Rfl: 1 .  pantoprazole (PROTONIX) 20 MG tablet, Take 1 tablet (20 mg total) by mouth  daily., Disp: 90 tablet, Rfl: 1 .  prochlorperazine (COMPAZINE) 10 MG tablet, Take 1 tablet (10 mg total) by mouth every 6 (six) hours as needed (Nausea or vomiting)., Disp: 30 tablet, Rfl: 1 .  vitamin B-12 (CYANOCOBALAMIN) 1000 MCG tablet, Take 1 tablet (1,000 mcg total) by mouth daily., Disp: 90 tablet, Rfl: 1 .  traMADol (ULTRAM) 50 MG tablet, Take 0.5-1 tablets (25-50 mg total) by mouth every 6 (six) hours as needed. (Patient not taking: Reported on 12/25/2019), Disp: 30 tablet, Rfl: 0  Review of Systems:  Constitutional: Denies fever, chills, diaphoresis. HEENT: Denies  photophobia, eye pain, redness, hearing loss, ear pain, congestion, sore throat, rhinorrhea, sneezing, mouth sores, trouble swallowing, neck pain, neck stiffness and tinnitus.   Respiratory: Denies SOB, DOE, cough, chest tightness,  and wheezing.   Cardiovascular: Denies chest pain, palpitations and leg swelling.  Gastrointestinal: Denies nausea, vomiting, diarrhea, constipation, blood in stool and abdominal distention.  Genitourinary: Denies dysuria, urgency, frequency, hematuria, flank pain and difficulty urinating.  Endocrine: Denies: hot or cold intolerance, sweats, changes in hair or nails, polyuria, polydipsia. Musculoskeletal: Denies myalgias, back pain, joint swelling, arthralgias and gait problem.  Skin: Denies pallor, rash and wound.  Neurological: Denies dizziness, seizures, syncope, weakness, light-headedness, numbness and headaches.  Hematological: Denies adenopathy. Easy bruising, personal or family bleeding history  Psychiatric/Behavioral: Denies suicidal ideation, mood changes, confusion, nervousness, sleep disturbance and agitation    Physical Exam: Vitals:   03/31/20 1105  BP: 120/74  Pulse: 79  Temp: 97.7 F (36.5 C)  TempSrc: Oral  SpO2: 96%  Weight: 118 lb 7 oz (53.7 kg)  Height: 5\' 4"  (1.626 m)    Body mass index is 20.33 kg/m.   Constitutional: NAD, calm, comfortable Eyes: PERRL, lids and conjunctivae normal ENMT: Mucous membranes are moist.  Respiratory: clear to auscultation bilaterally, no wheezing, no crackles. Normal respiratory effort. No accessory muscle use.  Cardiovascular: Regular rate and rhythm, no murmurs / rubs / gallops. No extremity edema. Abdomen: no tenderness, no masses palpated. No hepatosplenomegaly. Bowel sounds positive.  Psychiatric: Normal judgment and insight. Alert and oriented x 3. Normal mood.    Impression and Plan:  Type 2 diabetes mellitus without complication, without long-term current use of insulin (H[facility]) -A1c  demonstrates good control at 6.1 today. -When she is further out from chemotherapy will schedule a physical and run blood work for cholesterol.  Malignant neoplasm of head of pancreas ([place]) -Currently under the care of oncology and receiving chemotherapy.  Primary hypertension -Blood pressure has been well controlled on amlodipine 5 mg, metoprolol 50 mg twice daily.  Need for influenza vaccination -Flu vaccine administered today    Patient Instructions  -Nice seeing you today!!  -Flu vaccine today.  -Schedule follow up in 6 months or sooner as needed.     Lelon Frohlich, MD  Primary Care at [facility]

## 2020-03-31 NOTE — Addendum Note (Signed)
Addended by: Aviva Signs M on: 03/31/2020 12:19 PM   Modules accepted: Orders

## 2020-03-31 NOTE — Progress Notes (Signed)
Immunization name: Pneumococcal Conjugate-13  Immunization ID: [id number]  Date: 04/09/2013    Immunization name: Pneumococcal Polysaccharide-23  Immunization ID: [id number]  Date: 09/21/2019    Immunization name: Pneumococcal Polysaccharide-23  Immunization ID: [id number]  Date: 01/15/2006    Immunization name: Td  Immunization ID: [id number]  Date: 06/18/1991    Immunization name: Td  Immunization ID: [id number]  Date: 06/21/2003

## 2020-03-31 NOTE — Patient Instructions (Signed)
-  Nice seeing you today!!  -Flu vaccine today.  -Schedule follow up in 6 months or sooner as needed.

## 2020-04-03 NOTE — Progress Notes (Signed)
[place]   [phone] Fax:([phone]) [phone]   Clinic Follow up Note   Patient Care Team: Isaac Bliss, Rayford Halsted, MD as PCP - General (Internal Medicine) Jonnie Finner, RN as Oncology Nurse Navigator Truitt Merle, MD as Consulting Physician (Hematology)  Date of Service:  04/06/2020  CHIEF COMPLAINT: F/u of Pancreatic Cancer  SUMMARY OF ONCOLOGIC HISTORY: Oncology History Overview Note  Cancer Staging Pancreatic cancer [facility]) Staging form: Exocrine Pancreas, AJCC 8th Edition - Clinical: Stage III (cT4, cN1, cM0) - Signed by Truitt Merle, MD on 11/30/2019    Pancreatic cancer ([place])  06/04/2019 -  Hospital Admission   Admit date: 06/04/2019 Admission diagnosis: biliary obstruction  Additional comments: Patient presented with right upper quadrant abdominal pain, jaundice.  She was admitted to [facility] in [place].  She was noticed to have intrahepatic and extrahepatic biliary dilatation along with main pancreatic duct dilatation on CT 1 months prior.  MRCP showed a possible slight progression of the biliary and pancreatic duct dilatation.  Patient was evaluated by GI, underwent EGD.  Biopsy of the duodenum and increase did not review malignant cells, biopsy of pancreatic head showed chronic pancreatitis.  Patient underwent biliary drain placement by IR.  She was discharged home on June 12, 2019.   06/07/2019 Procedure   EUS with biopsy by Dr. Glade Stanford at [facility], [place].  Impression: Partial obstruction of the duodenal and the second portion in the expected area of the ampulla.  The overlying mucosa on this edematous folds which were causing the obstruction appeared normal with the exception of one area which was biopsied.    06/10/2019 Pathology Results   Common bile duct for cytology: Negative for malignant cells. Biopsy head of pancreas: chronic Pancreatitis   06/10/2019 Procedure   IR placed a biliary drain for  obstructive jaundice    07/30/2019 Imaging   CT abdomen and pelvis with contrast showed increased moderate distention of duodenal bulb without frank gastric distention.  Gallbladder absent, there is a stent in the low common bile duct without a significant dilatation.  There remains an external transhepatic biliary drain.  Increased to moderate dilatation of the pancreatic duct.  There is ill-defined fullness of the pancreatic head which is similar to prior exam.  The patient has underwent EUS aspiration and biliary stricture brushing without demonstration of tumor.  Pancreatic head the biopsy came back chronic pancreatitis.  Occult tumor remains possible.   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  Whipple procedure was planned but aborted because of him pancreatic mass was densely adherent to the mesentery vein.   11/21/2019 Initial Diagnosis   Pancreatic cancer ([place])   11/29/2019 Imaging   CT CAP W Contrast    IMPRESSION: 1. 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 2. Small lymph nodes in the retrocrural region on the LEFT largest 9 mm of uncertain significance. 3. Infiltration of the transverse mesocolon and extension of tumor into the root of the small bowel mesentery with abutment of SMV as well at the level of the first jejunal branch as described. 4. Profound hepatic steatosis. 5. Pneumobilia post stenting.  6. Signs of gastrojejunostomy and jejunostomy tube placement.   11/30/2019 Cancer Staging   Staging form: Exocrine  Pancreas, AJCC 8th Edition - Clinical: Stage III (cT4, cN1, cM0) - Signed by Truitt Merle, MD on 11/30/2019   12/08/2019 Procedure   She had PAC placed 12/08/19.    12/09/2019 -  Chemotherapy   Gemcitabine 2 weeks on/1 week off starting 12/09/19. Added Abraxane with C2 at 50% dose and decreased Gemcitabine by 20%.   -She developed worsening anemia after C2D1, treatment changed to every 2 weeks with retacrit on 01/12/20.   -Gemcitabine dose increased and Retacrit dose decreased with C4 on 02/10/20.         CURRENT THERAPY:  Gemcitabine 2 weeks on/1 week offstarting 12/09/19.Added Abraxane with C2 at 50% dose and decreased Gemcitabine by 20%. -She developed worsening anemia after C2D1, treatment changed to every 2 weeks with retacriton 01/12/20.  -Gemcitabine dose increased and Retacrit dose reduced with C4 on 02/10/20.  INTERVAL HISTORY:  Tamara Wood is here for a follow up. She presents to the clinic with her daughter-in-law and Spanish interpreter. She overall tolerated last cycle chemotherapy well. She had intermittent upper abdominal cramps after last cycle chemo, resolved now  BM normal, appetite is faire, no nausea  No neuropathy  Weight stable She is able to function at home. All other systems were reviewed with the patient and are negative.  MEDICAL HISTORY:  Past Medical History:  Diagnosis Date  . Diabetes mellitus without complication ([place])   . Hypertension   . Pancreatic cancer [facility])     SURGICAL HISTORY: Past Surgical History:  Procedure Laterality Date  . APPENDECTOMY    . BACK SURGERY    . CHOLECYSTECTOMY    . IR GASTROSTOMY TUBE REMOVAL  11/30/2019  . IR IMAGING GUIDED PORT INSERTION  12/08/2019    I have reviewed the social history and family history with the patient and they are unchanged from previous note.  ALLERGIES:  has No Known Allergies.  MEDICATIONS:  Current Outpatient Medications  Medication Sig Dispense Refill  .  acetaminophen (TYLENOL) 500 MG tablet Take 500-1,000 mg by mouth daily as needed for moderate pain.    Marland Kitchen amLODipine (NORVASC) 5 MG tablet Take 1 tablet (5 mg total) by mouth daily. 90 tablet 1  . ASPIRIN LOW DOSE 81 MG EC tablet Take 81 mg by mouth daily.    . Calcium Carb-Cholecalciferol (CALCIUM/VITAMIN D) 500-200 MG-UNIT TABS Take 1 tablet by mouth in the morning and at bedtime.     Marland Kitchen glucose blood test strip 1 each by Other route in the morning and at bedtime. Dx E11.9  For OneTouch Ultra 2 100 each 3  . HYDROcodone-acetaminophen (NORCO/VICODIN) 5-325 MG tablet Take 1 tablet by mouth every 6 (six) hours as needed. 90 tablet 0  . hydrocortisone (ANUSOL-HC) 2.5 % rectal cream Place 1 application rectally 2 (two) times daily. 30 g 0  . KLOR-CON M20 20 MEQ tablet TAKE 1 TABLET BY MOUTH EVERY DAY 30 tablet 1  . lidocaine-prilocaine (EMLA) cream Apply 1 application topically as needed. 30 g 3  . Magnesium 250 MG TABS Take 1 tablet (250 mg total) by mouth daily. 90 tablet 1  . metFORMIN (GLUCOPHAGE) 1000 MG tablet TAKE 1 TABLET BY MOUTH TWICE A DAY 180 tablet 0  . metoprolol tartrate (LOPRESSOR) 50 MG tablet Take 1 tablet (50 mg total) by mouth 2 (two) times daily. 180 tablet 1  . ondansetron (ZOFRAN) 8 MG tablet Take 1 tablet (8  mg total) by mouth 2 (two) times daily as needed (Nausea or vomiting). 30 tablet 1  . OneTouch Delica Lancets 81O MISC Use twice daily for glucose control, Dx E11.9 200 each 1  . pantoprazole (PROTONIX) 20 MG tablet Take 1 tablet (20 mg total) by mouth daily. 90 tablet 1  . prochlorperazine (COMPAZINE) 10 MG tablet Take 1 tablet (10 mg total) by mouth every 6 (six) hours as needed (Nausea or vomiting). 30 tablet 1  . traMADol (ULTRAM) 50 MG tablet Take 0.5-1 tablets (25-50 mg total) by mouth every 6 (six) hours as needed. (Patient not taking: Reported on 12/25/2019) 30 tablet 0  . vitamin B-12 (CYANOCOBALAMIN) 1000 MCG tablet Take 1 tablet (1,000 mcg total) by mouth daily. 90  tablet 1   No current facility-administered medications for this visit.    PHYSICAL EXAMINATION: ECOG PERFORMANCE STATUS: 2 - Symptomatic, <50% confined to bed  Vitals:   04/06/20 0852  BP: (!) 146/72  Pulse: 63  Resp: 16  Temp: (!) 97.4 F (36.3 C)  SpO2: 100%   Filed Weights   04/06/20 0852  Weight: 117 lb 14.4 oz (53.5 kg)    GENERAL:alert, no distress and comfortable SKIN: skin color, texture, turgor are normal, no rashes or significant lesions EYES: normal, Conjunctiva are pink and non-injected, sclera clear NECK: supple, thyroid normal size, non-tender, without nodularity LYMPH:  no palpable lymphadenopathy in the cervical, axillary  LUNGS: clear to auscultation and percussion with normal breathing effort HEART: regular rate & rhythm and no murmurs and no lower extremity edema ABDOMEN:abdomen soft, non-tender and normal bowel sounds Musculoskeletal:no cyanosis of digits and no clubbing  NEURO: alert & oriented x 3 with fluent speech, no focal motor/sensory deficits  LABORATORY DATA:  I have reviewed the data as listed CBC Latest Ref Rng & Units 04/06/2020 03/23/2020 03/09/2020  WBC 4.0 - 10.5 K/uL 3.4(L) 3.8(L) 4.2  Hemoglobin 12.0 - 15.0 g/dL 9.7(L) 10.9(L) 10.2(L)  Hematocrit 36 - 46 % 28.4(L) 33.1(L) 29.9(L)  Platelets 150 - 400 K/uL 230 259 247     CMP Latest Ref Rng & Units 03/23/2020 03/09/2020 02/24/2020  Glucose 70 - 99 mg/dL 177(H) 140(H) 195(H)  BUN 8 - 23 mg/dL 8 7(L) 7(L)  Creatinine 0.44 - 1.00 mg/dL 0.80 0.78 0.76  Sodium 135 - 145 mmol/L 137 137 140  Potassium 3.5 - 5.1 mmol/L 4.4 4.0 3.6  Chloride 98 - 111 mmol/L 101 103 104  CO2 22 - 32 mmol/L 30 30 29   Calcium 8.9 - 10.3 mg/dL 8.5(L) 8.1(L) 8.3(L)  Total Protein 6.5 - 8.1 g/dL 6.6 6.2(L) 6.0(L)  Total Bilirubin 0.3 - 1.2 mg/dL 0.6 0.6 0.6  Alkaline Phos 38 - 126 U/L 397(H) 409(H) 375(H)  AST 15 - 41 U/L 66(H) 66(H) 71(H)  ALT 0 - 44 U/L 32 31 29      RADIOGRAPHIC STUDIES: I have  personally reviewed the radiological images as listed and agreed with the findings in the report. No results found.   ASSESSMENT & PLAN:  Mala Gibbard is a 74 y.o. female with    1. Pancreatic Cancer,stage III,unresectable -She was diagnosed in 07/2019. Herbiopsy of pancreatic head mass on 08/10/19 showedinvasive well differentiated adenocarcinoma,peripancreaticLN biopsy wasalso positive.  -She underwentWhipple Surgery on 08/29/19 but was aborted due to tumor invading the SMV. She instead hadgastrojejunostomy. Her tumor has invaded her mesentery and vascular structures around her pancreas, making her tumor unresectable. It is no longer curable in this case but still treatable to controlher disease  and prolong her life. -I started her on first line chemotherapy with Gemcitabine 2 weeks on/1 week off on6/24/21. Abraxane was added with C2.She developed worsening anemia after C2D1, treatment changed to every 2 weeks with retacriton 01/12/20. -Plan to continue chemo until the end of this year, if next restaging scan shows no disease progression, will consider consolidation radiation. -She is tolerating chemotherapy well overall.  Lab reviewed, adequate for treatment, will proceed Abraxane and gemcitabine today  2. Anemia  -Secondary to chemo, onset after lower dose C2D1 Gem/Abraxane -Decreased treatment to every 2 weeks and started Retacrit every 2 weeks on 01/12/20, she only required 2 doses -Hold if Hg>=10  3.Malnutrition nutritionand weight loss -She had J-tube placed3/14/21-6/15/21. -For weight management continuenutritional supplement every 1-2 hours.She will continue to f/u with dietician. -Overall improved, weight is stable, she is eating better  4. Comorbidities: HTN, DM, H/o of spinal tumor, Chronic back pain -stable, f/u with PCP  5. Goal of care discussion, Social support -The patient understands the goal of care is palliative.She is full code for  now -She is originally from [place] She moved to [place] in 09/1989. She is married with 4 children. She is a Jehovah Witness   PLAN: -Lab reviewed, adequate for treatment, continue gemcitabine 700 mg/m today, and increase Abraxane to 70mg /m2, no need G-CSF for this cycle -Retacrit 20,000 unit for her anemia today -Lab, flush, follow-up and chemo in 2 weeks    No problem-specific Assessment & Plan notes found for this encounter.   No orders of the defined types were placed in this encounter.  All questions were answered. The patient knows to call the clinic with any problems, questions or concerns. No barriers to learning was detected. The total time spent in the appointment was 30 minutes.     Truitt Merle, MD 04/06/2020   I, Joslyn Devon, am acting as scribe for Truitt Merle, MD.   I have reviewed the above documentation for accuracy and completeness, and I agree with the above.

## 2020-04-06 ENCOUNTER — Inpatient Hospital Stay: Payer: 59

## 2020-04-06 ENCOUNTER — Other Ambulatory Visit: Payer: Self-pay

## 2020-04-06 ENCOUNTER — Inpatient Hospital Stay (HOSPITAL_BASED_OUTPATIENT_CLINIC_OR_DEPARTMENT_OTHER): Payer: 59 | Admitting: Hematology

## 2020-04-06 ENCOUNTER — Encounter: Payer: Self-pay | Admitting: Hematology

## 2020-04-06 ENCOUNTER — Telehealth: Payer: Self-pay

## 2020-04-06 VITALS — BP 146/72 | HR 63 | Temp 97.4°F | Resp 16 | Ht 64.0 in | Wt 117.9 lb

## 2020-04-06 DIAGNOSIS — Z5111 Encounter for antineoplastic chemotherapy: Secondary | ICD-10-CM | POA: Diagnosis not present

## 2020-04-06 DIAGNOSIS — Z7189 Other specified counseling: Secondary | ICD-10-CM | POA: Diagnosis not present

## 2020-04-06 DIAGNOSIS — Z95828 Presence of other vascular implants and grafts: Secondary | ICD-10-CM

## 2020-04-06 DIAGNOSIS — C250 Malignant neoplasm of head of pancreas: Secondary | ICD-10-CM

## 2020-04-06 DIAGNOSIS — K649 Unspecified hemorrhoids: Secondary | ICD-10-CM

## 2020-04-06 LAB — CBC WITH DIFFERENTIAL (CANCER CENTER ONLY)
Abs Immature Granulocytes: 0.01 10*3/uL (ref 0.00–0.07)
Basophils Absolute: 0 10*3/uL (ref 0.0–0.1)
Basophils Relative: 1 %
Eosinophils Absolute: 0.1 10*3/uL (ref 0.0–0.5)
Eosinophils Relative: 2 %
HCT: 28.4 % — ABNORMAL LOW (ref 36.0–46.0)
Hemoglobin: 9.7 g/dL — ABNORMAL LOW (ref 12.0–15.0)
Immature Granulocytes: 0 %
Lymphocytes Relative: 27 %
Lymphs Abs: 0.9 10*3/uL (ref 0.7–4.0)
MCH: 32.8 pg (ref 26.0–34.0)
MCHC: 34.2 g/dL (ref 30.0–36.0)
MCV: 95.9 fL (ref 80.0–100.0)
Monocytes Absolute: 0.4 10*3/uL (ref 0.1–1.0)
Monocytes Relative: 12 %
Neutro Abs: 1.9 10*3/uL (ref 1.7–7.7)
Neutrophils Relative %: 58 %
Platelet Count: 230 10*3/uL (ref 150–400)
RBC: 2.96 MIL/uL — ABNORMAL LOW (ref 3.87–5.11)
RDW: 17 % — ABNORMAL HIGH (ref 11.5–15.5)
WBC Count: 3.4 10*3/uL — ABNORMAL LOW (ref 4.0–10.5)
nRBC: 0 % (ref 0.0–0.2)

## 2020-04-06 LAB — CMP (CANCER CENTER ONLY)
ALT: 29 U/L (ref 0–44)
AST: 71 U/L — ABNORMAL HIGH (ref 15–41)
Albumin: 2.3 g/dL — ABNORMAL LOW (ref 3.5–5.0)
Alkaline Phosphatase: 403 U/L — ABNORMAL HIGH (ref 38–126)
Anion gap: 5 (ref 5–15)
BUN: 7 mg/dL — ABNORMAL LOW (ref 8–23)
CO2: 29 mmol/L (ref 22–32)
Calcium: 8.3 mg/dL — ABNORMAL LOW (ref 8.9–10.3)
Chloride: 104 mmol/L (ref 98–111)
Creatinine: 0.67 mg/dL (ref 0.44–1.00)
GFR, Estimated: >60 mL/min (ref >60)
Glucose, Bld: 119 mg/dL — ABNORMAL HIGH (ref 70–99)
Potassium: 3.8 mmol/L (ref 3.5–5.1)
Sodium: 138 mmol/L (ref 135–145)
Total Bilirubin: 0.6 mg/dL (ref 0.3–1.2)
Total Protein: 6.1 g/dL — ABNORMAL LOW (ref 6.5–8.1)

## 2020-04-06 MED ORDER — SODIUM CHLORIDE 0.9 % IV SOLN
Freq: Once | INTRAVENOUS | Status: AC
  Filled 2020-04-06: qty 250

## 2020-04-06 MED ORDER — EPOETIN ALFA-EPBX 40000 UNIT/ML IJ SOLN
INTRAMUSCULAR | Status: AC
  Filled 2020-04-06: qty 1

## 2020-04-06 MED ORDER — SODIUM CHLORIDE 0.9% FLUSH
10.0000 mL | INTRAVENOUS | Status: DC | PRN
  Administered 2020-04-06: 10 mL
  Filled 2020-04-06: qty 10

## 2020-04-06 MED ORDER — SODIUM CHLORIDE 0.9% FLUSH
10.0000 mL | Freq: Once | INTRAVENOUS | Status: AC
  Administered 2020-04-06: 10 mL
  Filled 2020-04-06: qty 10

## 2020-04-06 MED ORDER — EPOETIN ALFA-EPBX 10000 UNIT/ML IJ SOLN
20000.0000 [IU] | Freq: Once | INTRAMUSCULAR | Status: AC
  Administered 2020-04-06: 20000 [IU] via SUBCUTANEOUS

## 2020-04-06 MED ORDER — PROCHLORPERAZINE MALEATE 10 MG PO TABS: 10.0000 mg | ORAL_TABLET | Freq: Four times a day (QID) | ORAL | 1 refills | Status: AC | PRN

## 2020-04-06 MED ORDER — HYDROCORTISONE (PERIANAL) 2.5 % EX CREA: 1.0000 "application " | TOPICAL_CREAM | Freq: Two times a day (BID) | CUTANEOUS | 0 refills | Status: DC

## 2020-04-06 MED ORDER — HEPARIN SOD (PORK) LOCK FLUSH 100 UNIT/ML IV SOLN
500.0000 [IU] | Freq: Once | INTRAVENOUS | Status: AC | PRN
  Administered 2020-04-06: 500 [IU]
  Filled 2020-04-06: qty 5

## 2020-04-06 MED ORDER — PROCHLORPERAZINE MALEATE 10 MG PO TABS
ORAL_TABLET | ORAL | Status: AC
  Filled 2020-04-06: qty 1

## 2020-04-06 MED ORDER — EPOETIN ALFA-EPBX 40000 UNIT/ML IJ SOLN: 20000.0000 [IU] | Freq: Once | INTRAMUSCULAR | Status: DC

## 2020-04-06 MED ORDER — PROCHLORPERAZINE MALEATE 10 MG PO TABS
10.0000 mg | ORAL_TABLET | Freq: Once | ORAL | Status: AC
  Administered 2020-04-06: 10 mg via ORAL

## 2020-04-06 MED ORDER — EPOETIN ALFA-EPBX 10000 UNIT/ML IJ SOLN
INTRAMUSCULAR | Status: AC
  Filled 2020-04-06: qty 2

## 2020-04-06 MED ORDER — PACLITAXEL PROTEIN-BOUND CHEMO INJECTION 100 MG
70.0000 mg/m2 | Freq: Once | INTRAVENOUS | Status: AC
  Administered 2020-04-06: 100 mg via INTRAVENOUS
  Filled 2020-04-06: qty 20

## 2020-04-06 MED ORDER — SODIUM CHLORIDE 0.9 % IV SOLN
700.0000 mg/m2 | Freq: Once | INTRAVENOUS | Status: AC
  Administered 2020-04-06: 1064 mg via INTRAVENOUS
  Filled 2020-04-06: qty 27.98

## 2020-04-06 NOTE — Patient Instructions (Signed)
[place] [facility] Discharge Instructions for Patients Receiving Chemotherapy  Today you received the following chemotherapy agents Paclitaxel-protein bound (ABRAXANE) & Gemcitabine (GEMZAR).  To help prevent nausea and vomiting after your treatment, we encourage you to take your nausea medication as prescribed.   If you develop nausea and vomiting that is not controlled by your nausea medication, call the clinic.   BELOW ARE SYMPTOMS THAT SHOULD BE REPORTED IMMEDIATELY:  *FEVER GREATER THAN 100.5 F  *CHILLS WITH OR WITHOUT FEVER  NAUSEA AND VOMITING THAT IS NOT CONTROLLED WITH YOUR NAUSEA MEDICATION  *UNUSUAL SHORTNESS OF BREATH  *UNUSUAL BRUISING OR BLEEDING  TENDERNESS IN MOUTH AND THROAT WITH OR WITHOUT PRESENCE OF ULCERS  *URINARY PROBLEMS  *BOWEL PROBLEMS  UNUSUAL RASH Items with * indicate a potential emergency and should be followed up as soon as possible.  Feel free to call the clinic should you have any questions or concerns. The clinic phone number is [phone].  Please show the [place] at check-in to the Emergency Department and triage nurse.

## 2020-04-06 NOTE — Telephone Encounter (Signed)
Rx resent.

## 2020-04-06 NOTE — Telephone Encounter (Signed)
Patients daughter called in wanting to let the Dr know her Rx never was received after her visit on 03/31/20  hydrocortisone (ANUSOL-HC) 2.5 % rectal cream   [id number] - [place], [place] [address]

## 2020-04-06 NOTE — Patient Instructions (Signed)

## 2020-04-19 NOTE — Progress Notes (Signed)
[place]   [phone] Fax:([phone]   Clinic Follow up Note   Patient Care Team: Isaac Bliss, Rayford Halsted, MD as PCP - General (Internal Medicine) Jonnie Finner, RN as Oncology Nurse Navigator Truitt Merle, MD as Consulting Physician (Hematology)  Date of Service:  04/20/2020  CHIEF COMPLAINT: F/u of Pancreatic Cancer  SUMMARY OF ONCOLOGIC HISTORY: Oncology History Overview Note  Cancer Staging Pancreatic cancer [facility]) Staging form: Exocrine Pancreas, AJCC 8th Edition - Clinical: Stage III (cT4, cN1, cM0) - Signed by Truitt Merle, MD on 11/30/2019    Pancreatic cancer ([place])  06/04/2019 -  Hospital Admission   Admit date: 06/04/2019 Admission diagnosis: biliary obstruction  Additional comments: Patient presented with right upper quadrant abdominal pain, jaundice.  She was admitted to [facility] in [place].  She was noticed to have intrahepatic and extrahepatic biliary dilatation along with main pancreatic duct dilatation on CT 1 months prior.  MRCP showed a possible slight progression of the biliary and pancreatic duct dilatation.  Patient was evaluated by GI, underwent EGD.  Biopsy of the duodenum and increase did not review malignant cells, biopsy of pancreatic head showed chronic pancreatitis.  Patient underwent biliary drain placement by IR.  She was discharged home on June 12, 2019.   06/07/2019 Procedure   EUS with biopsy by Dr. Glade Stanford at [facility], [place].  Impression: Partial obstruction of the duodenal and the second portion in the expected area of the ampulla.  The overlying mucosa on this edematous folds which were causing the obstruction appeared normal with the exception of one area which was biopsied.    06/10/2019 Pathology Results   Common bile duct for cytology: Negative for malignant cells. Biopsy head of pancreas: chronic Pancreatitis   06/10/2019 Procedure   IR placed a biliary drain for  obstructive jaundice    07/30/2019 Imaging   CT abdomen and pelvis with contrast showed increased moderate distention of duodenal bulb without frank gastric distention.  Gallbladder absent, there is a stent in the low common bile duct without a significant dilatation.  There remains an external transhepatic biliary drain.  Increased to moderate dilatation of the pancreatic duct.  There is ill-defined fullness of the pancreatic head which is similar to prior exam.  The patient has underwent EUS aspiration and biliary stricture brushing without demonstration of tumor.  Pancreatic head the biopsy came back chronic pancreatitis.  Occult tumor remains possible.   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  Whipple procedure was planned but aborted because of him pancreatic mass was densely adherent to the mesentery vein.   11/21/2019 Initial Diagnosis   Pancreatic cancer ([place])   11/29/2019 Imaging   CT CAP W Contrast    IMPRESSION: 1. 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 2. Small lymph nodes in the retrocrural region on the LEFT largest 9 mm of uncertain significance. 3. Infiltration of the transverse mesocolon and extension of tumor into the root of the small bowel mesentery with abutment of SMV as well at the level of the first jejunal branch as described. 4. Profound hepatic steatosis. 5. Pneumobilia post stenting.  6. Signs of gastrojejunostomy and jejunostomy tube placement.   11/30/2019 Cancer Staging   Staging form: Exocrine  Pancreas, AJCC 8th Edition - Clinical: Stage III (cT4, cN1, cM0) - Signed by Truitt Merle, MD on 11/30/2019   12/08/2019 Procedure   She had PAC placed 12/08/19.    12/09/2019 -  Chemotherapy   Gemcitabine 2 weeks on/1 week off starting 12/09/19. Added Abraxane with C2 at 50% dose and decreased Gemcitabine by 20%.   -She developed worsening anemia after C2D1, treatment changed to every 2 weeks with retacrit on 01/12/20.   -Gemcitabine dose increased and Retacrit dose decreased with C4 on 02/10/20.         CURRENT THERAPY:  Gemcitabine 2 weeks on/1 week offstarting 12/09/19.Added Abraxane with C2 at 50% dose and decreased Gemcitabine by 20%. -She developed worsening anemia after C2D1, treatment changed to every 2 weeks with retacriton 01/12/20.  -Gemcitabine dose increased and Retacrit dose reduced with C4 on 02/10/20.  INTERVAL HISTORY:  Tamara Wood is here for a follow up. She presents to the clinic with her son-in-law.  She tolerated last cycle chemo moderately well, similar to prior chemo  Moderate fatigue, able to function at home Had 3 episodes of vomiting in past last two weeks, no nausea  Feels cold, no fever or chills Weight stable, eats well  All other systems were reviewed with the patient and are negative.  MEDICAL HISTORY:  Past Medical History:  Diagnosis Date  . Diabetes mellitus without complication ([place])   . Hypertension   . Pancreatic cancer [facility])     SURGICAL HISTORY: Past Surgical History:  Procedure Laterality Date  . APPENDECTOMY    . BACK SURGERY    . CHOLECYSTECTOMY    . IR GASTROSTOMY TUBE REMOVAL  11/30/2019  . IR IMAGING GUIDED PORT INSERTION  12/08/2019    I have reviewed the social history and family history with the patient and they are unchanged from previous note.  ALLERGIES:  has No Known Allergies.  MEDICATIONS:  Current Outpatient Medications  Medication Sig Dispense Refill  . acetaminophen (TYLENOL) 500 MG tablet  Take 500-1,000 mg by mouth daily as needed for moderate pain.    Marland Kitchen amLODipine (NORVASC) 5 MG tablet Take 1 tablet (5 mg total) by mouth daily. 90 tablet 1  . ASPIRIN LOW DOSE 81 MG EC tablet Take 81 mg by mouth daily.    . Calcium Carb-Cholecalciferol (CALCIUM/VITAMIN D) 500-200 MG-UNIT TABS Take 1 tablet by mouth in the morning and at bedtime.     Marland Kitchen glucose blood test strip 1 each by Other route in the morning and at bedtime. Dx E11.9  For OneTouch Ultra 2 100 each 3  . HYDROcodone-acetaminophen (NORCO/VICODIN) 5-325 MG tablet Take 1 tablet by mouth every 6 (six) hours as needed. 90 tablet 0  . hydrocortisone (ANUSOL-HC) 2.5 % rectal cream Place 1 application rectally 2 (two) times daily. 30 g 0  . KLOR-CON M20 20 MEQ tablet TAKE 1 TABLET BY MOUTH EVERY DAY 30 tablet 1  . lidocaine-prilocaine (EMLA) cream Apply 1 application topically as needed. 30 g 3  . Magnesium 250 MG TABS Take 1 tablet (250 mg total) by mouth daily. 90 tablet 1  . metFORMIN (GLUCOPHAGE) 1000 MG tablet TAKE 1 TABLET BY MOUTH TWICE A DAY 180 tablet 0  . metoprolol tartrate (LOPRESSOR) 50 MG tablet Take 1 tablet (50 mg total) by mouth 2 (two) times daily. 180 tablet 1  . ondansetron (ZOFRAN) 8 MG tablet Take 1 tablet (  8 mg total) by mouth 2 (two) times daily as needed (Nausea or vomiting). 30 tablet 1  . OneTouch Delica Lancets 24P MISC Use twice daily for glucose control, Dx E11.9 200 each 1  . pantoprazole (PROTONIX) 20 MG tablet Take 1 tablet (20 mg total) by mouth daily. 90 tablet 1  . prochlorperazine (COMPAZINE) 10 MG tablet Take 1 tablet (10 mg total) by mouth every 6 (six) hours as needed (Nausea or vomiting). 30 tablet 1  . traMADol (ULTRAM) 50 MG tablet Take 0.5-1 tablets (25-50 mg total) by mouth every 6 (six) hours as needed. (Patient not taking: Reported on 12/25/2019) 30 tablet 0  . vitamin B-12 (CYANOCOBALAMIN) 1000 MCG tablet Take 1 tablet (1,000 mcg total) by mouth daily. 90 tablet 1   No current  facility-administered medications for this visit.    PHYSICAL EXAMINATION: ECOG PERFORMANCE STATUS: 2 - Symptomatic, <50% confined to bed  Vitals:   04/20/20 1004  BP: (!) 143/83  Pulse: (!) 106  Resp: (!) 101  Temp: (!) 97.1 F (36.2 C)  SpO2: 100%   Filed Weights   04/20/20 1004  Weight: 120 lb 3.2 oz (54.5 kg)    GENERAL:alert, no distress and comfortable SKIN: skin color, texture, turgor are normal, no rashes or significant lesions EYES: normal, Conjunctiva are pink and non-injected, sclera clear NECK: supple, thyroid normal size, non-tender, without nodularity LYMPH:  no palpable lymphadenopathy in the cervical, axillary  LUNGS: clear to auscultation and percussion with normal breathing effort HEART: regular rate & rhythm and no murmurs and no lower extremity edema ABDOMEN:abdomen soft, non-tender and normal bowel sounds Musculoskeletal:no cyanosis of digits and no clubbing  NEURO: alert & oriented x 3 with fluent speech, no focal motor/sensory deficits  LABORATORY DATA:  I have reviewed the data as listed CBC Latest Ref Rng & Units 04/20/2020 04/06/2020 03/23/2020  WBC 4.0 - 10.5 K/uL 3.3(L) 3.4(L) 3.8(L)  Hemoglobin 12.0 - 15.0 g/dL 9.8(L) 9.7(L) 10.9(L)  Hematocrit 36 - 46 % 29.7(L) 28.4(L) 33.1(L)  Platelets 150 - 400 K/uL 225 230 259     CMP Latest Ref Rng & Units 04/20/2020 04/06/2020 03/23/2020  Glucose 70 - 99 mg/dL 162(H) 119(H) 177(H)  BUN 8 - 23 mg/dL 10 7(L) 8  Creatinine 0.44 - 1.00 mg/dL 0.73 0.67 0.80  Sodium 135 - 145 mmol/L 138 138 137  Potassium 3.5 - 5.1 mmol/L 4.3 3.8 4.4  Chloride 98 - 111 mmol/L 103 104 101  CO2 22 - 32 mmol/L 26 29 30   Calcium 8.9 - 10.3 mg/dL 8.1(L) 8.3(L) 8.5(L)  Total Protein 6.5 - 8.1 g/dL 6.0(L) 6.1(L) 6.6  Total Bilirubin 0.3 - 1.2 mg/dL 0.9 0.6 0.6  Alkaline Phos 38 - 126 U/L 376(H) 403(H) 397(H)  AST 15 - 41 U/L 58(H) 71(H) 66(H)  ALT 0 - 44 U/L 21 29 32      RADIOGRAPHIC STUDIES: I have personally reviewed  the radiological images as listed and agreed with the findings in the report. No results found.   ASSESSMENT & PLAN:  Tamara Wood is a 74 y.o. female with   1. Pancreatic Cancer,stage III,unresectable -She was diagnosed in 07/2019. Herbiopsy of pancreatic head mass on 08/10/19 showedinvasive well differentiated adenocarcinoma,peripancreaticLN biopsy wasalso positive.  -She underwentWhipple Surgery on 08/29/19 but was aborted due to tumor invading the SMV. She instead hadgastrojejunostomy. Her tumor has invaded her mesentery and vascular structures around her pancreas, making her tumor unresectable. It is no longer curable in this case but still treatable to  controlher disease and prolong her life. -I started her on first line chemotherapy with Gemcitabine 2 weeks on/1 week off on6/24/21. Abraxane was added with C2.She developed worsening anemia after C2D1, treatment changed to every 2 weeks with retacriton 01/12/20. -Plan to continuechemo until the end of this year, ifnext restaging scanshowsno disease progression, will consider consolidation radiation. -She is tolerating chemotherapy well overall.  Lab reviewed, adequate for treatment, will proceed Abraxane and gemcitabine todayat same dose  -f/u in 2 weeks, repeat scan in mid Dec   2. Anemia  -Secondary to chemo, onset after lower dose C2D1 Gem/Abraxane -Decreased treatment to every 2 weeks and started Retacrit every 2 weeks on 01/12/20,she only required 2 doses -HoldifHg>=10  3.Malnutrition nutritionand weight loss -She had J-tube placed3/14/21-6/15/21. -For weight management continuenutritional supplement every 1-2 hours.She will continue to f/u with dietician. -Overall improved, weight is stable, she is eating better  4. Comorbidities: HTN, DM, H/o of spinal tumor, Chronic back pain -stable, f/u with PCP  5. Goal of care discussion, Social support -The patient understands the goal of care is  palliative.She is full code for now -She is originally from [place] She moved to [place] in 09[place]1989. She is married with 4 children. She is a Jehov[employer] Witness   PLAN: -Lab reviewed, adequate for treatment, continue gemcitabine 700 mg/m today, and Abraxane 70mg /m2, no need G-CSF for this cycle -Retacrit 20,000 unit for her anemia today -Lab, flush, follow-up and chemo in 2 weeks   No problem-specific Assessment & Plan notes found for this encounter.   No orders of the defined types were placed in this encounter.  All questions were answered. The patient knows to call the clinic with any problems, questions or concerns. No barriers to learning was detected. The total time spent in the appointment was 30 minutes.     Truitt Merle, MD 04/20/2020   I, Joslyn Devon, am acting as scribe for Truitt Merle, MD.   I have reviewed the above documentation for accuracy and completeness, and I agree with the above.

## 2020-04-20 ENCOUNTER — Telehealth: Payer: Self-pay | Admitting: Hematology

## 2020-04-20 ENCOUNTER — Inpatient Hospital Stay: Payer: 59 | Attending: Hematology

## 2020-04-20 ENCOUNTER — Telehealth: Payer: Self-pay

## 2020-04-20 ENCOUNTER — Inpatient Hospital Stay: Payer: 59

## 2020-04-20 ENCOUNTER — Inpatient Hospital Stay (HOSPITAL_BASED_OUTPATIENT_CLINIC_OR_DEPARTMENT_OTHER): Payer: 59 | Admitting: Hematology

## 2020-04-20 ENCOUNTER — Other Ambulatory Visit: Payer: Self-pay

## 2020-04-20 ENCOUNTER — Encounter: Payer: Self-pay | Admitting: Hematology

## 2020-04-20 VITALS — HR 96

## 2020-04-20 VITALS — BP 143/83 | HR 106 | Temp 97.1°F | Resp 18 | Ht 64.0 in | Wt 120.2 lb

## 2020-04-20 DIAGNOSIS — Z79899 Other long term (current) drug therapy: Secondary | ICD-10-CM | POA: Diagnosis not present

## 2020-04-20 DIAGNOSIS — I10 Essential (primary) hypertension: Secondary | ICD-10-CM | POA: Insufficient documentation

## 2020-04-20 DIAGNOSIS — R1110 Vomiting, unspecified: Secondary | ICD-10-CM | POA: Insufficient documentation

## 2020-04-20 DIAGNOSIS — Z7984 Long term (current) use of oral hypoglycemic drugs: Secondary | ICD-10-CM | POA: Diagnosis not present

## 2020-04-20 DIAGNOSIS — E46 Unspecified protein-calorie malnutrition: Secondary | ICD-10-CM | POA: Diagnosis not present

## 2020-04-20 DIAGNOSIS — R5383 Other fatigue: Secondary | ICD-10-CM | POA: Insufficient documentation

## 2020-04-20 DIAGNOSIS — D6481 Anemia due to antineoplastic chemotherapy: Secondary | ICD-10-CM | POA: Diagnosis not present

## 2020-04-20 DIAGNOSIS — R634 Abnormal weight loss: Secondary | ICD-10-CM | POA: Diagnosis not present

## 2020-04-20 DIAGNOSIS — C250 Malignant neoplasm of head of pancreas: Secondary | ICD-10-CM | POA: Diagnosis not present

## 2020-04-20 DIAGNOSIS — Z5111 Encounter for antineoplastic chemotherapy: Secondary | ICD-10-CM | POA: Diagnosis not present

## 2020-04-20 DIAGNOSIS — T451X5S Adverse effect of antineoplastic and immunosuppressive drugs, sequela: Secondary | ICD-10-CM | POA: Diagnosis not present

## 2020-04-20 DIAGNOSIS — Z95828 Presence of other vascular implants and grafts: Secondary | ICD-10-CM

## 2020-04-20 DIAGNOSIS — G893 Neoplasm related pain (acute) (chronic): Secondary | ICD-10-CM | POA: Diagnosis not present

## 2020-04-20 DIAGNOSIS — E1140 Type 2 diabetes mellitus with diabetic neuropathy, unspecified: Secondary | ICD-10-CM | POA: Diagnosis not present

## 2020-04-20 DIAGNOSIS — Z7189 Other specified counseling: Secondary | ICD-10-CM

## 2020-04-20 LAB — CBC WITH DIFFERENTIAL (CANCER CENTER ONLY)
Abs Immature Granulocytes: 0.01 10*3/uL (ref 0.00–0.07)
Basophils Absolute: 0 10*3/uL (ref 0.0–0.1)
Basophils Relative: 1 %
Eosinophils Absolute: 0 10*3/uL (ref 0.0–0.5)
Eosinophils Relative: 1 %
HCT: 29.7 % — ABNORMAL LOW (ref 36.0–46.0)
Hemoglobin: 9.8 g/dL — ABNORMAL LOW (ref 12.0–15.0)
Immature Granulocytes: 0 %
Lymphocytes Relative: 18 %
Lymphs Abs: 0.6 10*3/uL — ABNORMAL LOW (ref 0.7–4.0)
MCH: 31.8 pg (ref 26.0–34.0)
MCHC: 33 g/dL (ref 30.0–36.0)
MCV: 96.4 fL (ref 80.0–100.0)
Monocytes Absolute: 0.5 10*3/uL (ref 0.1–1.0)
Monocytes Relative: 14 %
Neutro Abs: 2.2 10*3/uL (ref 1.7–7.7)
Neutrophils Relative %: 66 %
Platelet Count: 225 10*3/uL (ref 150–400)
RBC: 3.08 MIL/uL — ABNORMAL LOW (ref 3.87–5.11)
RDW: 17.9 % — ABNORMAL HIGH (ref 11.5–15.5)
WBC Count: 3.3 10*3/uL — ABNORMAL LOW (ref 4.0–10.5)
nRBC: 0 % (ref 0.0–0.2)

## 2020-04-20 LAB — CMP (CANCER CENTER ONLY)
ALT: 21 U/L (ref 0–44)
AST: 58 U/L — ABNORMAL HIGH (ref 15–41)
Albumin: 2.3 g/dL — ABNORMAL LOW (ref 3.5–5.0)
Alkaline Phosphatase: 376 U/L — ABNORMAL HIGH (ref 38–126)
Anion gap: 9 (ref 5–15)
BUN: 10 mg/dL (ref 8–23)
CO2: 26 mmol/L (ref 22–32)
Calcium: 8.1 mg/dL — ABNORMAL LOW (ref 8.9–10.3)
Chloride: 103 mmol/L (ref 98–111)
Creatinine: 0.73 mg/dL (ref 0.44–1.00)
GFR, Estimated: >60 mL/min (ref >60)
Glucose, Bld: 162 mg/dL — ABNORMAL HIGH (ref 70–99)
Potassium: 4.3 mmol/L (ref 3.5–5.1)
Sodium: 138 mmol/L (ref 135–145)
Total Bilirubin: 0.9 mg/dL (ref 0.3–1.2)
Total Protein: 6 g/dL — ABNORMAL LOW (ref 6.5–8.1)

## 2020-04-20 MED ORDER — SODIUM CHLORIDE 0.9% FLUSH
10.0000 mL | INTRAVENOUS | Status: DC | PRN
  Administered 2020-04-20: 10 mL
  Filled 2020-04-20: qty 10

## 2020-04-20 MED ORDER — HEPARIN SOD (PORK) LOCK FLUSH 100 UNIT/ML IV SOLN
500.0000 [IU] | Freq: Once | INTRAVENOUS | Status: AC | PRN
  Administered 2020-04-20: 500 [IU]
  Filled 2020-04-20: qty 5

## 2020-04-20 MED ORDER — EPOETIN ALFA-EPBX 40000 UNIT/ML IJ SOLN
INTRAMUSCULAR | Status: AC
  Filled 2020-04-20: qty 1

## 2020-04-20 MED ORDER — PROCHLORPERAZINE MALEATE 10 MG PO TABS
ORAL_TABLET | ORAL | Status: AC
  Filled 2020-04-20: qty 1

## 2020-04-20 MED ORDER — SODIUM CHLORIDE 0.9 % IV SOLN
Freq: Once | INTRAVENOUS | Status: AC
  Filled 2020-04-20: qty 250

## 2020-04-20 MED ORDER — SODIUM CHLORIDE 0.9 % IV SOLN
700.0000 mg/m2 | Freq: Once | INTRAVENOUS | Status: AC
  Administered 2020-04-20: 1064 mg via INTRAVENOUS
  Filled 2020-04-20: qty 27.98

## 2020-04-20 MED ORDER — PACLITAXEL PROTEIN-BOUND CHEMO INJECTION 100 MG
70.0000 mg/m2 | Freq: Once | INTRAVENOUS | Status: AC
  Administered 2020-04-20: 100 mg via INTRAVENOUS
  Filled 2020-04-20: qty 20

## 2020-04-20 MED ORDER — PROCHLORPERAZINE MALEATE 10 MG PO TABS
10.0000 mg | ORAL_TABLET | Freq: Once | ORAL | Status: AC
  Administered 2020-04-20: 10 mg via ORAL

## 2020-04-20 MED ORDER — EPOETIN ALFA-EPBX 40000 UNIT/ML IJ SOLN
20000.0000 [IU] | Freq: Once | INTRAMUSCULAR | Status: AC
  Administered 2020-04-20: 20000 [IU] via SUBCUTANEOUS

## 2020-04-20 MED ORDER — SODIUM CHLORIDE 0.9% FLUSH
10.0000 mL | Freq: Once | INTRAVENOUS | Status: AC
  Administered 2020-04-20: 10 mL
  Filled 2020-04-20: qty 10

## 2020-04-20 NOTE — Telephone Encounter (Signed)
I spoke with Ms Linse's daughter [name] regarding the need for [facility] tomorrow.  Appt made.  [name] verbalized understanding.

## 2020-04-20 NOTE — Patient Instructions (Signed)
[facility] Discharge Instructions for Patients Receiving Chemotherapy  Today you received the following chemotherapy agents: Abraxane/Gemzar.  To help prevent nausea and vomiting after your treatment, we encourage you to take your nausea medication as directed.   If you develop nausea and vomiting that is not controlled by your nausea medication, call the clinic.   BELOW ARE SYMPTOMS THAT SHOULD BE REPORTED IMMEDIATELY:  *FEVER GREATER THAN 100.5 F  *CHILLS WITH OR WITHOUT FEVER  NAUSEA AND VOMITING THAT IS NOT CONTROLLED WITH YOUR NAUSEA MEDICATION  *UNUSUAL SHORTNESS OF BREATH  *UNUSUAL BRUISING OR BLEEDING  TENDERNESS IN MOUTH AND THROAT WITH OR WITHOUT PRESENCE OF ULCERS  *URINARY PROBLEMS  *BOWEL PROBLEMS  UNUSUAL RASH Items with * indicate a potential emergency and should be followed up as soon as possible.  Feel free to call the clinic should you have any questions or concerns. The clinic phone number is [phone].  Please show the CHEMO ALERT CARD at check-in to the Emergency Department and triage nurse.   

## 2020-04-21 ENCOUNTER — Inpatient Hospital Stay: Payer: 59

## 2020-04-21 ENCOUNTER — Other Ambulatory Visit: Payer: Self-pay

## 2020-04-21 ENCOUNTER — Telehealth: Payer: Self-pay | Admitting: Hematology

## 2020-04-21 VITALS — BP 133/86 | HR 80 | Resp 18

## 2020-04-21 DIAGNOSIS — Z7189 Other specified counseling: Secondary | ICD-10-CM

## 2020-04-21 DIAGNOSIS — Z5111 Encounter for antineoplastic chemotherapy: Secondary | ICD-10-CM | POA: Diagnosis not present

## 2020-04-21 DIAGNOSIS — C250 Malignant neoplasm of head of pancreas: Secondary | ICD-10-CM

## 2020-04-21 LAB — CANCER ANTIGEN 19-9: CA 19-9: 330 U/mL — ABNORMAL HIGH (ref 0–35)

## 2020-04-21 MED ORDER — PEGFILGRASTIM-CBQV 6 MG/0.6ML ~~LOC~~ SOSY
PREFILLED_SYRINGE | SUBCUTANEOUS | Status: AC
  Filled 2020-04-21: qty 0.6

## 2020-04-21 MED ORDER — PEGFILGRASTIM-CBQV 6 MG/0.6ML ~~LOC~~ SOSY
6.0000 mg | PREFILLED_SYRINGE | Freq: Once | SUBCUTANEOUS | Status: AC
  Administered 2020-04-21: 6 mg via SUBCUTANEOUS

## 2020-04-21 NOTE — Telephone Encounter (Signed)
Scheduled per 11/4 los. Spoke with pt's daughter and is aware of appt times and dates.

## 2020-05-04 ENCOUNTER — Inpatient Hospital Stay: Payer: 59

## 2020-05-04 ENCOUNTER — Other Ambulatory Visit: Payer: Self-pay

## 2020-05-04 ENCOUNTER — Inpatient Hospital Stay (HOSPITAL_BASED_OUTPATIENT_CLINIC_OR_DEPARTMENT_OTHER): Payer: 59 | Admitting: Nurse Practitioner

## 2020-05-04 ENCOUNTER — Encounter: Payer: Self-pay | Admitting: Nurse Practitioner

## 2020-05-04 VITALS — BP 119/74 | HR 88 | Temp 97.8°F | Resp 18 | Ht 64.0 in | Wt 124.9 lb

## 2020-05-04 DIAGNOSIS — C250 Malignant neoplasm of head of pancreas: Secondary | ICD-10-CM | POA: Diagnosis not present

## 2020-05-04 DIAGNOSIS — R600 Localized edema: Secondary | ICD-10-CM

## 2020-05-04 DIAGNOSIS — Z5111 Encounter for antineoplastic chemotherapy: Secondary | ICD-10-CM | POA: Diagnosis not present

## 2020-05-04 DIAGNOSIS — Z7189 Other specified counseling: Secondary | ICD-10-CM

## 2020-05-04 LAB — CBC WITH DIFFERENTIAL (CANCER CENTER ONLY)
Abs Immature Granulocytes: 0.27 10*3/uL — ABNORMAL HIGH (ref 0.00–0.07)
Basophils Absolute: 0.1 10*3/uL (ref 0.0–0.1)
Basophils Relative: 1 %
Eosinophils Absolute: 0 10*3/uL (ref 0.0–0.5)
Eosinophils Relative: 0 %
HCT: 28.8 % — ABNORMAL LOW (ref 36.0–46.0)
Hemoglobin: 9.6 g/dL — ABNORMAL LOW (ref 12.0–15.0)
Immature Granulocytes: 2 %
Lymphocytes Relative: 8 %
Lymphs Abs: 1 10*3/uL (ref 0.7–4.0)
MCH: 33.3 pg (ref 26.0–34.0)
MCHC: 33.3 g/dL (ref 30.0–36.0)
MCV: 100 fL (ref 80.0–100.0)
Monocytes Absolute: 0.6 10*3/uL (ref 0.1–1.0)
Monocytes Relative: 5 %
Neutro Abs: 10.7 10*3/uL — ABNORMAL HIGH (ref 1.7–7.7)
Neutrophils Relative %: 84 %
Platelet Count: 213 10*3/uL (ref 150–400)
RBC: 2.88 MIL/uL — ABNORMAL LOW (ref 3.87–5.11)
RDW: 19.2 % — ABNORMAL HIGH (ref 11.5–15.5)
WBC Count: 12.6 10*3/uL — ABNORMAL HIGH (ref 4.0–10.5)
nRBC: 0 % (ref 0.0–0.2)

## 2020-05-04 LAB — CMP (CANCER CENTER ONLY)
ALT: 23 U/L (ref 0–44)
AST: 61 U/L — ABNORMAL HIGH (ref 15–41)
Albumin: 2 g/dL — ABNORMAL LOW (ref 3.5–5.0)
Alkaline Phosphatase: 422 U/L — ABNORMAL HIGH (ref 38–126)
Anion gap: 5 (ref 5–15)
BUN: 11 mg/dL (ref 8–23)
CO2: 27 mmol/L (ref 22–32)
Calcium: 7.7 mg/dL — ABNORMAL LOW (ref 8.9–10.3)
Chloride: 104 mmol/L (ref 98–111)
Creatinine: 0.78 mg/dL (ref 0.44–1.00)
GFR, Estimated: >60 mL/min (ref >60)
Glucose, Bld: 181 mg/dL — ABNORMAL HIGH (ref 70–99)
Potassium: 4.4 mmol/L (ref 3.5–5.1)
Sodium: 136 mmol/L (ref 135–145)
Total Bilirubin: 1 mg/dL (ref 0.3–1.2)
Total Protein: 5.6 g/dL — ABNORMAL LOW (ref 6.5–8.1)

## 2020-05-04 MED ORDER — SODIUM CHLORIDE 0.9 % IV SOLN
700.0000 mg/m2 | Freq: Once | INTRAVENOUS | Status: AC
  Administered 2020-05-04: 1064 mg via INTRAVENOUS
  Filled 2020-05-04: qty 27.98

## 2020-05-04 MED ORDER — SODIUM CHLORIDE 0.9% FLUSH
10.0000 mL | INTRAVENOUS | Status: DC | PRN
  Filled 2020-05-04: qty 10

## 2020-05-04 MED ORDER — PACLITAXEL PROTEIN-BOUND CHEMO INJECTION 100 MG
70.0000 mg/m2 | Freq: Once | INTRAVENOUS | Status: AC
  Administered 2020-05-04: 100 mg via INTRAVENOUS
  Filled 2020-05-04: qty 20

## 2020-05-04 MED ORDER — HEPARIN SOD (PORK) LOCK FLUSH 100 UNIT/ML IV SOLN
500.0000 [IU] | Freq: Once | INTRAVENOUS | Status: DC | PRN
  Filled 2020-05-04: qty 5

## 2020-05-04 MED ORDER — SODIUM CHLORIDE 0.9 % IV SOLN
Freq: Once | INTRAVENOUS | Status: AC
  Filled 2020-05-04: qty 250

## 2020-05-04 MED ORDER — PROCHLORPERAZINE MALEATE 10 MG PO TABS
10.0000 mg | ORAL_TABLET | Freq: Once | ORAL | Status: AC
  Administered 2020-05-04: 10 mg via ORAL

## 2020-05-04 MED ORDER — PROCHLORPERAZINE MALEATE 10 MG PO TABS
ORAL_TABLET | ORAL | Status: AC
  Filled 2020-05-04: qty 1

## 2020-05-04 NOTE — Patient Instructions (Signed)
[facility] Discharge Instructions for Patients Receiving Chemotherapy  Today you received the following chemotherapy agents: Abraxane/Gemzar.  To help prevent nausea and vomiting after your treatment, we encourage you to take your nausea medication as directed.   If you develop nausea and vomiting that is not controlled by your nausea medication, call the clinic.   BELOW ARE SYMPTOMS THAT SHOULD BE REPORTED IMMEDIATELY:  *FEVER GREATER THAN 100.5 F  *CHILLS WITH OR WITHOUT FEVER  NAUSEA AND VOMITING THAT IS NOT CONTROLLED WITH YOUR NAUSEA MEDICATION  *UNUSUAL SHORTNESS OF BREATH  *UNUSUAL BRUISING OR BLEEDING  TENDERNESS IN MOUTH AND THROAT WITH OR WITHOUT PRESENCE OF ULCERS  *URINARY PROBLEMS  *BOWEL PROBLEMS  UNUSUAL RASH Items with * indicate a potential emergency and should be followed up as soon as possible.  Feel free to call the clinic should you have any questions or concerns. The clinic phone number is [phone].  Please show the CHEMO ALERT CARD at check-in to the Emergency Department and triage nurse.   

## 2020-05-04 NOTE — Progress Notes (Signed)
[place]   [phone] Fax:([phone]) [phone]   Clinic Follow up Note   Patient Care Team: Isaac Bliss, Rayford Halsted, MD as PCP - General (Internal Medicine) Jonnie Finner, RN as Oncology Nurse Navigator Truitt Merle, MD as Consulting Physician (Hematology) 05/04/2020  CHIEF COMPLAINT: Follow-up pancreas cancer  SUMMARY OF ONCOLOGIC HISTORY: Oncology History Overview Note  Cancer Staging Pancreatic cancer [facility]) Staging form: Exocrine Pancreas, AJCC 8th Edition - Clinical: Stage III (cT4, cN1, cM0) - Signed by Truitt Merle, MD on 11/30/2019    Pancreatic cancer ([place])  06/04/2019 -  Hospital Admission   Admit date: 06/04/2019 Admission diagnosis: biliary obstruction  Additional comments: Patient presented with right upper quadrant abdominal pain, jaundice.  She was admitted to [facility] in [place].  She was noticed to have intrahepatic and extrahepatic biliary dilatation along with main pancreatic duct dilatation on CT 1 months prior.  MRCP showed a possible slight progression of the biliary and pancreatic duct dilatation.  Patient was evaluated by GI, underwent EGD.  Biopsy of the duodenum and increase did not review malignant cells, biopsy of pancreatic head showed chronic pancreatitis.  Patient underwent biliary drain placement by IR.  She was discharged home on June 12, 2019.   06/07/2019 Procedure   EUS with biopsy by Dr. Glade Stanford at [facility], [place].  Impression: Partial obstruction of the duodenal and the second portion in the expected area of the ampulla.  The overlying mucosa on this edematous folds which were causing the obstruction appeared normal with the exception of one area which was biopsied.    06/10/2019 Pathology Results   Common bile duct for cytology: Negative for malignant cells. Biopsy head of pancreas: chronic Pancreatitis   06/10/2019 Procedure   IR placed a biliary drain for obstructive jaundice      07/30/2019 Imaging   CT abdomen and pelvis with contrast showed increased moderate distention of duodenal bulb without frank gastric distention.  Gallbladder absent, there is a stent in the low common bile duct without a significant dilatation.  There remains an external transhepatic biliary drain.  Increased to moderate dilatation of the pancreatic duct.  There is ill-defined fullness of the pancreatic head which is similar to prior exam.  The patient has underwent EUS aspiration and biliary stricture brushing without demonstration of tumor.  Pancreatic head the biopsy came back chronic pancreatitis.  Occult tumor remains possible.   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  Whipple procedure was planned but aborted because of him pancreatic mass was densely adherent to the mesentery vein.   11/21/2019 Initial Diagnosis   Pancreatic cancer ([place])   11/29/2019 Imaging   CT CAP W Contrast    IMPRESSION: 1. 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 2. Small lymph nodes in the retrocrural region on the LEFT largest 9 mm of uncertain significance. 3. Infiltration of the transverse mesocolon and extension of tumor into the root of the small bowel mesentery with abutment of SMV as well at the level of the first jejunal branch as described. 4. Profound hepatic steatosis. 5. Pneumobilia post stenting. 6. Signs of gastrojejunostomy and  jejunostomy tube placement.   11/30/2019 Cancer Staging   Staging form: Exocrine Pancreas, AJCC 8th  Edition - Clinical: Stage III (cT4, cN1, cM0) - Signed by Truitt Merle, MD on 11/30/2019   12/08/2019 Procedure   She had PAC placed 12/08/19.    12/09/2019 -  Chemotherapy   Gemcitabine 2 weeks on/1 week off starting 12/09/19. Added Abraxane with C2 at 50% dose and decreased Gemcitabine by 20%.   -She developed worsening anemia after C2D1, treatment changed to every 2 weeks with retacrit on 01/12/20.   -Gemcitabine dose increased and Retacrit dose decreased with C4 on 02/10/20.        CURRENT THERAPY:  Gemcitabine 2 weeks on/1 week offstarting 12/09/19.Added Abraxane with C2 at 50% dose and decreased Gemcitabine by 20%. -She developed worsening anemia after C2D1, treatment changed to every 2 weeks with retacriton 01/12/20.  -Gemcitabine dose increased and Retacrit dose reduced with C4 on 02/10/20.  INTERVAL HISTORY: Ms. Vanpelt returns with family for follow-up and treatment as scheduled.  She completed another cycle of gemcitabine and Abraxane on 11/4.  She did receive Udenyca on 11/5.  Her main concern is poor energy, activity level fluctuates she remains mostly functional with ADLs but not much more than that.  Nausea and vomiting was much better this cycle.  She had diarrhea for 1 day that resolved.  Her mild, intermittent abdominal pain remains stable on chemo.  3 days ago she developed new leg edema, right slightly greater than left.  Denies calf pain.  While sitting she has dangles her legs.  She favors her left side and back positions.  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  No fall.  Otherwise denies fever, chills, cough, chest pain, dyspnea, mucositis, rash.    MEDICAL HISTORY:  Past Medical History:  Diagnosis Date  . Diabetes mellitus without complication ([place])   . Hypertension   . Pancreatic cancer [facility])     SURGICAL HISTORY: Past Surgical History:  Procedure Laterality Date  . APPENDECTOMY    . BACK SURGERY    .  CHOLECYSTECTOMY    . IR GASTROSTOMY TUBE REMOVAL  11/30/2019  . IR IMAGING GUIDED PORT INSERTION  12/08/2019    I have reviewed the social history and family history with the patient and they are unchanged from previous note.  ALLERGIES:  has No Known Allergies.  MEDICATIONS:  Current Outpatient Medications  Medication Sig Dispense Refill  . acetaminophen (TYLENOL) 500 MG tablet Take 500-1,000 mg by mouth daily as needed for moderate pain.    Marland Kitchen amLODipine (NORVASC) 5 MG tablet Take 1 tablet (5 mg total) by mouth daily. 90 tablet 1  . ASPIRIN LOW DOSE 81 MG EC tablet Take 81 mg by mouth daily.    . Calcium Carb-Cholecalciferol (CALCIUM/VITAMIN D) 500-200 MG-UNIT TABS Take 1 tablet by mouth in the morning and at bedtime.     Marland Kitchen glucose blood test strip 1 each by Other route in the morning and at bedtime. Dx E11.9  For OneTouch Ultra 2 100 each 3  . HYDROcodone-acetaminophen (NORCO/VICODIN) 5-325 MG tablet Take 1 tablet by mouth every 6 (six) hours as needed. 90 tablet 0  . hydrocortisone (ANUSOL-HC) 2.5 % rectal cream Place 1 application rectally 2 (two) times daily. 30 g 0  . KLOR-CON M20 20 MEQ tablet TAKE 1 TABLET BY MOUTH EVERY DAY 30 tablet 1  . lidocaine-prilocaine (EMLA) cream Apply 1 application topically as needed. 30 g 3  . Magnesium 250 MG  TABS Take 1 tablet (250 mg total) by mouth daily. 90 tablet 1  . metFORMIN (GLUCOPHAGE) 1000 MG tablet TAKE 1 TABLET BY MOUTH TWICE A DAY 180 tablet 0  . metoprolol tartrate (LOPRESSOR) 50 MG tablet Take 1 tablet (50 mg total) by mouth 2 (two) times daily. 180 tablet 1  . ondansetron (ZOFRAN) 8 MG tablet Take 1 tablet (8 mg total) by mouth 2 (two) times daily as needed (Nausea or vomiting). 30 tablet 1  . OneTouch Delica Lancets 51W MISC Use twice daily for glucose control, Dx E11.9 200 each 1  . pantoprazole (PROTONIX) 20 MG tablet Take 1 tablet (20 mg total) by mouth daily. 90 tablet 1  . prochlorperazine (COMPAZINE) 10 MG tablet Take 1 tablet  (10 mg total) by mouth every 6 (six) hours as needed (Nausea or vomiting). 30 tablet 1  . traMADol (ULTRAM) 50 MG tablet Take 0.5-1 tablets (25-50 mg total) by mouth every 6 (six) hours as needed. (Patient not taking: Reported on 12/25/2019) 30 tablet 0  . vitamin B-12 (CYANOCOBALAMIN) 1000 MCG tablet Take 1 tablet (1,000 mcg total) by mouth daily. 90 tablet 1   No current facility-administered medications for this visit.    PHYSICAL EXAMINATION: ECOG PERFORMANCE STATUS: 1-2  Vitals:   05/04/20 1142  BP: 119/74  Pulse: 88  Resp: 18  Temp: 97.8 F (36.6 C)  SpO2: 100%   Filed Weights   05/04/20 1142  Weight: 124 lb 14.4 oz (56.7 kg)    GENERAL:alert, no distress and comfortable SKIN: No rash EYES: sclera clear LUNGS:  normal breathing effort HEART: Mild bilateral R>L lower extremity edema, no calf tenderness or palpable cord ABDOMEN:abdomen soft, non-tender and normal bowel sounds NEURO: alert & oriented x 3 with fluent speech, steady with cane PAC without erythema  LABORATORY DATA:  I have reviewed the data as listed CBC Latest Ref Rng & Units 05/04/2020 04/20/2020 04/06/2020  WBC 4.0 - 10.5 K/uL 12.6(H) 3.3(L) 3.4(L)  Hemoglobin 12.0 - 15.0 g/dL 9.6(L) 9.8(L) 9.7(L)  Hematocrit 36 - 46 % 28.8(L) 29.7(L) 28.4(L)  Platelets 150 - 400 K/uL 213 225 230     CMP Latest Ref Rng & Units 05/04/2020 04/20/2020 04/06/2020  Glucose 70 - 99 mg/dL 181(H) 162(H) 119(H)  BUN 8 - 23 mg/dL 11 10 7(L)  Creatinine 0.44 - 1.00 mg/dL 0.78 0.73 0.67  Sodium 135 - 145 mmol/L 136 138 138  Potassium 3.5 - 5.1 mmol/L 4.4 4.3 3.8  Chloride 98 - 111 mmol/L 104 103 104  CO2 22 - 32 mmol/L 27 26 29   Calcium 8.9 - 10.3 mg/dL 7.7(L) 8.1(L) 8.3(L)  Total Protein 6.5 - 8.1 g/dL 5.6(L) 6.0(L) 6.1(L)  Total Bilirubin 0.3 - 1.2 mg/dL 1.0 0.9 0.6  Alkaline Phos 38 - 126 U/L 422(H) 376(H) 403(H)  AST 15 - 41 U/L 61(H) 58(H) 71(H)  ALT 0 - 44 U/L 23 21 29       RADIOGRAPHIC STUDIES: I have  personally reviewed the radiological images as listed and agreed with the findings in the report. No results found.   ASSESSMENT & PLAN: Fantashia Almonteis a 74 y.o.femalewith    1. Pancreatic Cancer,stage III,unresectable -She initially presented to hospital in [place] with RUQ abdominal and jaundice. Her work up showed intrahepatic and extrahepatic biliary dilatation. EGDwith biopsy ofduodenal mucosa and head of pancreasshowed no cancer cells at that time. Sheunderwent biliary drain placement by IRin 05/2020 which was removed in 09/2019.  -She underwent biopsy of pancreatic head mass on 08/10/19 which  showedinvasive well differentiated adenocarcinoma,peripancreaticLN biopsy wasalso positive.  -She underwentWhipple Surgery on 08/29/19 but was aborted due to tumor invading the SMV. She instead hadgastrojejunostomy and J-tube placementdue tobowelblockages. -Her tumor has invaded her mesentery and vascular structures around her pancreas, making her tumor unresectable. It is no longer curable in this case but still treatable to controlher disease and prolong her life.  -CT CAP from 11/29/19 showsno evidence of distant metastasis, butagain demonstrated locally advanced disease. Her baseline Ca 19.9 was elevated at 911 which is concerning for high tumor burden and developing distant metastasis.  -Given her age, Dr. Burr Medico recommended (and agreed upon by multidisciplinary team)systemic chemo with Gemcitabinewith or without Abraxane2 weeks on/1 week off for 3-6 months followed byconsolidationRadiation.  -goal is palliative -began single agentgemcitbaine on 01/08/20, she tolerated well with mild but well controlled nausea and fatigue/weakness. She recovered well and50% dose-reduced abraxane was added with C2. -Cancer related right\flank pain resolved with gem/Abraxane, indicating a clinical response to treatment -Chemo was dose reduced, changed to every 2 weeks, and Retacrit was added with  cycle 2 given she is Jehovah's Witness and can not receive blood transfusion -With cycle 4 gemcitabine was increased and Retacrit was decreased, tolerating well  2. S/p fall 01/04/20, left foot/ankle injury Tripped getting up from chair, no LOC  -Xray in ED negative -mild swelling and bruising, can bear weight. Continuesupportive care and ambulate with a cane -resolved  3. Nutrition  -During her 08/29/19 surgery her whipple was aborted and instead had bypass of duodenum and CBD due to blockages.She also had J-tube placed at that time. -J tube removed 6/15 -using supplements, weightstabilized lately;continue follow-up with nutrition  4. Comorbidities: HTN, DM, H/o of spinal tumor, Chronic back pain -followed by PCP Dr. 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 When she moved to [place] in 09/1989 she had back surgery and had it removed.  -She haschroniclowback pain; her cancer related right back/flank painimproved with Norco -Resolved since adding Abraxane,she is likely responding to treatment  5. Social Support  -She is originally from [place] She moved to [place] in 09/1989. She is married with 4 children.  -After medical issues since late 05/2019 her son and his wife who are fluent in [place] moved to [place]. After cancer diagnosis, all of them moved to [place], [place] in early May given 3 of her children live in [place]. She has great family support  -She is a Jehovah Witness, does not accept blood products  6. B/l leg pain  -doppler negative for DVTin 11/2019 -3 days ago (05/04/20) she developed new leg edema R>L. Given her risk factors for DVT will repeat doppler to r/o  7.Goal of care discussion  -The goal of care is palliative. -she is full code for now  Disposition: Ms. Rahal appears stable.  She  completed another cycle of gemcitabine and Abraxane.  She tolerates treatment well with mild fatigue and diarrhea.  Side effects are managed well with supportive care at home.  She is able to recover and function well.  She developed new leg edema R>L, no other concerning signs of DVT.  However given she has pancreas cancer, on chemotherapy, and sedentary lifestyle due to her age and chemo SE's she has risk factors for blood clots.  I am referring her for Doppler to rule out DVT.  We will call her with the results.  I have  also encouraged her to elevate legs while resting, wear compression stockings, increase ambulation, and let us know if she has new calf pain or progressive edema.  The CBC was reviewed, leukocytosis is secondary to [facility].  Anemia is stable.  If CMP is adequate she will proceed with gemcitabine and Abraxane today as planned.  Recent CA 19-9 has improved.  The plan is to restage middle of December.  Hold Udenyca unless she is neutropenic. She will receive Retacrit today with treatment.   Follow-up in 2 weeks with next cycle.  All questions were answered. The patient knows to call the clinic with any problems, questions or concerns. No barriers to learning were detected.     Alla Feeling, NP 05/04/20

## 2020-05-05 ENCOUNTER — Ambulatory Visit (HOSPITAL_COMMUNITY)
Admission: RE | Admit: 2020-05-05 | Discharge: 2020-05-05 | Disposition: A | Discharge status: Home / Self Care | Payer: 59 | Source: Ambulatory Visit | Attending: Nurse Practitioner | Admitting: Nurse Practitioner

## 2020-05-05 ENCOUNTER — Telehealth: Payer: Self-pay | Admitting: Nurse Practitioner

## 2020-05-05 DIAGNOSIS — R600 Localized edema: Secondary | ICD-10-CM | POA: Insufficient documentation

## 2020-05-05 NOTE — Telephone Encounter (Signed)
Scheduled per 11/18 los. Spoke with pt's daughter and is aware of appts added.

## 2020-05-05 NOTE — Progress Notes (Signed)
Lower extremity venous has been completed.   Preliminary results in CV Proc.   Abram Sander 05/05/2020 10:32 AM

## 2020-05-15 ENCOUNTER — Other Ambulatory Visit: Payer: Self-pay | Admitting: Hematology

## 2020-05-15 DIAGNOSIS — C250 Malignant neoplasm of head of pancreas: Secondary | ICD-10-CM

## 2020-05-15 DIAGNOSIS — Z7189 Other specified counseling: Secondary | ICD-10-CM

## 2020-05-18 ENCOUNTER — Inpatient Hospital Stay: Payer: 59

## 2020-05-18 ENCOUNTER — Inpatient Hospital Stay: Payer: 59 | Attending: Hematology

## 2020-05-18 ENCOUNTER — Other Ambulatory Visit: Payer: 59

## 2020-05-18 ENCOUNTER — Encounter: Payer: Self-pay | Admitting: Nurse Practitioner

## 2020-05-18 ENCOUNTER — Ambulatory Visit: Payer: 59 | Admitting: Hematology

## 2020-05-18 ENCOUNTER — Other Ambulatory Visit: Payer: Self-pay

## 2020-05-18 ENCOUNTER — Inpatient Hospital Stay (HOSPITAL_BASED_OUTPATIENT_CLINIC_OR_DEPARTMENT_OTHER): Payer: 59 | Admitting: Nurse Practitioner

## 2020-05-18 VITALS — BP 115/69 | HR 88 | Temp 97.0°F | Resp 17 | Ht 64.0 in | Wt 130.0 lb

## 2020-05-18 DIAGNOSIS — C250 Malignant neoplasm of head of pancreas: Secondary | ICD-10-CM

## 2020-05-18 DIAGNOSIS — Z5111 Encounter for antineoplastic chemotherapy: Secondary | ICD-10-CM | POA: Insufficient documentation

## 2020-05-18 DIAGNOSIS — D6481 Anemia due to antineoplastic chemotherapy: Secondary | ICD-10-CM | POA: Insufficient documentation

## 2020-05-18 DIAGNOSIS — Z79899 Other long term (current) drug therapy: Secondary | ICD-10-CM | POA: Diagnosis not present

## 2020-05-18 DIAGNOSIS — E119 Type 2 diabetes mellitus without complications: Secondary | ICD-10-CM | POA: Diagnosis not present

## 2020-05-18 DIAGNOSIS — Z95828 Presence of other vascular implants and grafts: Secondary | ICD-10-CM

## 2020-05-18 DIAGNOSIS — I10 Essential (primary) hypertension: Secondary | ICD-10-CM | POA: Diagnosis not present

## 2020-05-18 DIAGNOSIS — Z7982 Long term (current) use of aspirin: Secondary | ICD-10-CM | POA: Insufficient documentation

## 2020-05-18 DIAGNOSIS — E8809 Other disorders of plasma-protein metabolism, not elsewhere classified: Secondary | ICD-10-CM | POA: Insufficient documentation

## 2020-05-18 DIAGNOSIS — T451X5A Adverse effect of antineoplastic and immunosuppressive drugs, initial encounter: Secondary | ICD-10-CM | POA: Insufficient documentation

## 2020-05-18 DIAGNOSIS — R609 Edema, unspecified: Secondary | ICD-10-CM | POA: Diagnosis not present

## 2020-05-18 DIAGNOSIS — G893 Neoplasm related pain (acute) (chronic): Secondary | ICD-10-CM | POA: Diagnosis not present

## 2020-05-18 DIAGNOSIS — M79605 Pain in left leg: Secondary | ICD-10-CM | POA: Diagnosis not present

## 2020-05-18 DIAGNOSIS — Z7984 Long term (current) use of oral hypoglycemic drugs: Secondary | ICD-10-CM | POA: Diagnosis not present

## 2020-05-18 DIAGNOSIS — Z7189 Other specified counseling: Secondary | ICD-10-CM

## 2020-05-18 DIAGNOSIS — Z5189 Encounter for other specified aftercare: Secondary | ICD-10-CM | POA: Diagnosis not present

## 2020-05-18 LAB — URINALYSIS, COMPLETE (UACMP) WITH MICROSCOPIC
Bilirubin Urine: NEGATIVE
Glucose, UA: NEGATIVE mg/dL
Hgb urine dipstick: NEGATIVE
Ketones, ur: NEGATIVE mg/dL
Nitrite: NEGATIVE
Protein, ur: NEGATIVE mg/dL
Specific Gravity, Urine: 1.008 (ref 1.005–1.030)
WBC, UA: >50 WBC/hpf — ABNORMAL HIGH (ref 0–5)
pH: 5 (ref 5.0–8.0)

## 2020-05-18 LAB — CBC WITH DIFFERENTIAL (CANCER CENTER ONLY)
Abs Immature Granulocytes: 0.02 10*3/uL (ref 0.00–0.07)
Basophils Absolute: 0 10*3/uL (ref 0.0–0.1)
Basophils Relative: 2 %
Eosinophils Absolute: 0 10*3/uL (ref 0.0–0.5)
Eosinophils Relative: 2 %
HCT: 25.6 % — ABNORMAL LOW (ref 36.0–46.0)
Hemoglobin: 8.7 g/dL — ABNORMAL LOW (ref 12.0–15.0)
Immature Granulocytes: 1 %
Lymphocytes Relative: 13 %
Lymphs Abs: 0.3 10*3/uL — ABNORMAL LOW (ref 0.7–4.0)
MCH: 34.1 pg — ABNORMAL HIGH (ref 26.0–34.0)
MCHC: 34 g/dL (ref 30.0–36.0)
MCV: 100.4 fL — ABNORMAL HIGH (ref 80.0–100.0)
Monocytes Absolute: 0.3 10*3/uL (ref 0.1–1.0)
Monocytes Relative: 13 %
Neutro Abs: 1.9 10*3/uL (ref 1.7–7.7)
Neutrophils Relative %: 69 %
Platelet Count: 180 10*3/uL (ref 150–400)
RBC: 2.55 MIL/uL — ABNORMAL LOW (ref 3.87–5.11)
RDW: 18.6 % — ABNORMAL HIGH (ref 11.5–15.5)
WBC Count: 2.6 10*3/uL — ABNORMAL LOW (ref 4.0–10.5)
nRBC: 0 % (ref 0.0–0.2)

## 2020-05-18 LAB — CMP (CANCER CENTER ONLY)
ALT: 34 U/L (ref 0–44)
AST: 70 U/L — ABNORMAL HIGH (ref 15–41)
Albumin: 1.8 g/dL — ABNORMAL LOW (ref 3.5–5.0)
Alkaline Phosphatase: 317 U/L — ABNORMAL HIGH (ref 38–126)
Anion gap: 8 (ref 5–15)
BUN: 8 mg/dL (ref 8–23)
CO2: 21 mmol/L — ABNORMAL LOW (ref 22–32)
Calcium: 7.7 mg/dL — ABNORMAL LOW (ref 8.9–10.3)
Chloride: 106 mmol/L (ref 98–111)
Creatinine: 0.73 mg/dL (ref 0.44–1.00)
GFR, Estimated: >60 mL/min (ref >60)
Glucose, Bld: 159 mg/dL — ABNORMAL HIGH (ref 70–99)
Potassium: 4.2 mmol/L (ref 3.5–5.1)
Sodium: 135 mmol/L (ref 135–145)
Total Bilirubin: 1 mg/dL (ref 0.3–1.2)
Total Protein: 5.4 g/dL — ABNORMAL LOW (ref 6.5–8.1)

## 2020-05-18 MED ORDER — SODIUM CHLORIDE 0.9 % IV SOLN
700.0000 mg/m2 | Freq: Once | INTRAVENOUS | Status: AC
  Administered 2020-05-18: 1064 mg via INTRAVENOUS
  Filled 2020-05-18: qty 27.98

## 2020-05-18 MED ORDER — HEPARIN SOD (PORK) LOCK FLUSH 100 UNIT/ML IV SOLN
500.0000 [IU] | Freq: Once | INTRAVENOUS | Status: DC | PRN
  Filled 2020-05-18: qty 5

## 2020-05-18 MED ORDER — SODIUM CHLORIDE 0.9% FLUSH
10.0000 mL | Freq: Once | INTRAVENOUS | Status: AC
  Administered 2020-05-18: 10 mL
  Filled 2020-05-18: qty 10

## 2020-05-18 MED ORDER — PROCHLORPERAZINE MALEATE 10 MG PO TABS
ORAL_TABLET | ORAL | Status: AC
  Filled 2020-05-18: qty 1

## 2020-05-18 MED ORDER — PROCHLORPERAZINE MALEATE 10 MG PO TABS
10.0000 mg | ORAL_TABLET | Freq: Once | ORAL | Status: AC
  Administered 2020-05-18: 10 mg via ORAL

## 2020-05-18 MED ORDER — EPOETIN ALFA-EPBX 40000 UNIT/ML IJ SOLN
20000.0000 [IU] | Freq: Once | INTRAMUSCULAR | Status: AC
  Administered 2020-05-18: 20000 [IU] via SUBCUTANEOUS

## 2020-05-18 MED ORDER — EPOETIN ALFA-EPBX 40000 UNIT/ML IJ SOLN
INTRAMUSCULAR | Status: AC
  Filled 2020-05-18: qty 1

## 2020-05-18 MED ORDER — SODIUM CHLORIDE 0.9% FLUSH
10.0000 mL | INTRAVENOUS | Status: DC | PRN
  Filled 2020-05-18: qty 10

## 2020-05-18 MED ORDER — LORAZEPAM 1 MG PO TABS: 0.5000 mg | ORAL_TABLET | Freq: Once | ORAL | Status: DC

## 2020-05-18 MED ORDER — FUROSEMIDE 20 MG PO TABS: ORAL_TABLET | ORAL | 0 refills | Status: DC

## 2020-05-18 MED ORDER — SODIUM CHLORIDE 0.9 % IV SOLN
Freq: Once | INTRAVENOUS | Status: AC
  Filled 2020-05-18: qty 250

## 2020-05-18 MED ORDER — PACLITAXEL PROTEIN-BOUND CHEMO INJECTION 100 MG
70.0000 mg/m2 | Freq: Once | INTRAVENOUS | Status: AC
  Administered 2020-05-18: 100 mg via INTRAVENOUS
  Filled 2020-05-18: qty 20

## 2020-05-18 MED ORDER — EPOETIN ALFA-EPBX 10000 UNIT/ML IJ SOLN
INTRAMUSCULAR | Status: AC
  Filled 2020-05-18: qty 2

## 2020-05-18 NOTE — Patient Instructions (Signed)

## 2020-05-18 NOTE — Progress Notes (Signed)
Pt stable at time of discharge via wheelchair with family member.

## 2020-05-18 NOTE — Patient Instructions (Signed)
[facility] Discharge Instructions for Patients Receiving Chemotherapy  Today you received the following chemotherapy agents paclitaxel, gemcitabine.   To help prevent nausea and vomiting after your treatment, we encourage you to take your nausea medication as directed.    If you develop nausea and vomiting that is not controlled by your nausea medication, call the clinic.   BELOW ARE SYMPTOMS THAT SHOULD BE REPORTED IMMEDIATELY:  *FEVER GREATER THAN 100.5 F  *CHILLS WITH OR WITHOUT FEVER  NAUSEA AND VOMITING THAT IS NOT CONTROLLED WITH YOUR NAUSEA MEDICATION  *UNUSUAL SHORTNESS OF BREATH  *UNUSUAL BRUISING OR BLEEDING  TENDERNESS IN MOUTH AND THROAT WITH OR WITHOUT PRESENCE OF ULCERS  *URINARY PROBLEMS  *BOWEL PROBLEMS  UNUSUAL RASH Items with * indicate a potential emergency and should be followed up as soon as possible.  Feel free to call the clinic should you have any questions or concerns. The clinic phone number is [phone].  Please show the [place] at check-in to the Emergency Department and triage nurse.

## 2020-05-18 NOTE — Progress Notes (Signed)
[place]   [phone] [phone]   Clinic Follow up Note   Patient Care Team: Isaac Bliss, Rayford Halsted, MD as PCP - General (Internal Medicine) Jonnie Finner, RN as Oncology Nurse Navigator Truitt Merle, MD as Consulting Physician (Hematology) 05/18/2020  CHIEF COMPLAINT: Follow up pancreas cancer   SUMMARY OF ONCOLOGIC HISTORY: Oncology History Overview Note  Cancer Staging Pancreatic cancer [facility]) Staging form: Exocrine Pancreas, AJCC 8th Edition - Clinical: Stage III (cT4, cN1, cM0) - Signed by Truitt Merle, MD on 11/30/2019    Pancreatic cancer ([place])  06/04/2019 -  Hospital Admission   Admit date: 06/04/2019 Admission diagnosis: biliary obstruction  Additional comments: Patient presented with right upper quadrant abdominal pain, jaundice.  She was admitted to [facility] in [place].  She was noticed to have intrahepatic and extrahepatic biliary dilatation along with main pancreatic duct dilatation on CT 1 months prior.  MRCP showed a possible slight progression of the biliary and pancreatic duct dilatation.  Patient was evaluated by GI, underwent EGD.  Biopsy of the duodenum and increase did not review malignant cells, biopsy of pancreatic head showed chronic pancreatitis.  Patient underwent biliary drain placement by IR.  She was discharged home on June 12, 2019.   06/07/2019 Procedure   EUS with biopsy by Dr. Glade Stanford at [facility], [place].  Impression: Partial obstruction of the duodenal and the second portion in the expected area of the ampulla.  The overlying mucosa on this edematous folds which were causing the obstruction appeared normal with the exception of one area which was biopsied.    06/10/2019 Pathology Results   Common bile duct for cytology: Negative for malignant cells. Biopsy head of pancreas: chronic Pancreatitis   06/10/2019 Procedure   IR placed a biliary drain for obstructive jaundice     07/30/2019 Imaging   CT abdomen and pelvis with contrast showed increased moderate distention of duodenal bulb without frank gastric distention.  Gallbladder absent, there is a stent in the low common bile duct without a significant dilatation.  There remains an external transhepatic biliary drain.  Increased to moderate dilatation of the pancreatic duct.  There is ill-defined fullness of the pancreatic head which is similar to prior exam.  The patient has underwent EUS aspiration and biliary stricture brushing without demonstration of tumor.  Pancreatic head the biopsy came back chronic pancreatitis.  Occult tumor remains possible.   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  Whipple procedure was planned but aborted because of him pancreatic mass was densely adherent to the mesentery vein.   11/21/2019 Initial Diagnosis   Pancreatic cancer ([place])   11/29/2019 Imaging   CT CAP W Contrast    IMPRESSION: 1. 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 2. Small lymph nodes in the retrocrural region on the LEFT largest 9 mm of uncertain significance. 3. Infiltration of the transverse mesocolon and extension of tumor into the root of the small bowel mesentery with abutment of SMV as well at the level of the first jejunal branch as described. 4. Profound hepatic steatosis. 5. Pneumobilia post stenting. 6. Signs of gastrojejunostomy  and jejunostomy tube placement.   11/30/2019 Cancer Staging   Staging form: Exocrine Pancreas, AJCC 8th  Edition - Clinical: Stage III (cT4, cN1, cM0) - Signed by Truitt Merle, MD on 11/30/2019   12/08/2019 Procedure   She had PAC placed 12/08/19.    12/09/2019 -  Chemotherapy   Gemcitabine 2 weeks on/1 week off starting 12/09/19. Added Abraxane with C2 at 50% dose and decreased Gemcitabine by 20%.   -She developed worsening anemia after C2D1, treatment changed to every 2 weeks with retacrit on 01/12/20.   -Gemcitabine dose increased and Retacrit dose decreased with C4 on 02/10/20.        CURRENT THERAPY:  Gemcitabine 2 weeks on/1 week offstarting 12/09/19.Added Abraxane with C2 at 50% dose and decreased Gemcitabine by 20%. -She developed worsening anemia after C2D1, treatment changed to every 2 weeks with retacriton 01/12/20.  -Gemcitabine dose increased and Retacrit dose reduced with C4 on 02/10/20.  INTERVAL HISTORY: Ms. Borgerding returns for follow up and treatment as scheduled. She completed another cycle of gem/abraxane last given 05/04/20.  Her energy is lower in general.  Eating and drinking is adequate.  She woke up nervous this morning after taking NyQuil for a mild dry cough.  No chest pain or dyspnea.  No fever or chills.  The NyQuil is helping her cough but she has some abdominal bloating and soreness patient attributes to using those muscles during cough.  Bowels moving, no nausea or vomiting.  She is not voiding as much but drinking the same.  No dysuria or urinary frequency or urgency.  Urine is a normal color "yellow with a little red but not blood."  Legs continued to be swollen and heavy.   MEDICAL HISTORY:  Past Medical History:  Diagnosis Date  . Diabetes mellitus without complication ([place])   . Hypertension   . Pancreatic cancer [facility])     SURGICAL HISTORY: Past Surgical History:  Procedure Laterality Date  . APPENDECTOMY    . BACK SURGERY    . CHOLECYSTECTOMY    . IR GASTROSTOMY TUBE REMOVAL  11/30/2019  . IR IMAGING GUIDED PORT INSERTION   12/08/2019    I have reviewed the social history and family history with the patient and they are unchanged from previous note.  ALLERGIES:  has No Known Allergies.  MEDICATIONS:  Current Outpatient Medications  Medication Sig Dispense Refill  . acetaminophen (TYLENOL) 500 MG tablet Take 500-1,000 mg by mouth daily as needed for moderate pain.    Marland Kitchen amLODipine (NORVASC) 5 MG tablet Take 1 tablet (5 mg total) by mouth daily. 90 tablet 1  . ASPIRIN LOW DOSE 81 MG EC tablet Take 81 mg by mouth daily.    . Calcium Carb-Cholecalciferol (CALCIUM/VITAMIN D) 500-200 MG-UNIT TABS Take 1 tablet by mouth in the morning and at bedtime.     . furosemide (LASIX) 20 MG tablet Take 0.5 (half) tablet every other day for swelling 30 tablet 0  . glucose blood test strip 1 each by Other route in the morning and at bedtime. Dx E11.9  For OneTouch Ultra 2 100 each 3  . HYDROcodone-acetaminophen (NORCO/VICODIN) 5-325 MG tablet Take 1 tablet by mouth every 6 (six) hours as needed. 90 tablet 0  . hydrocortisone (ANUSOL-HC) 2.5 % rectal cream Place 1 application rectally 2 (two) times daily. 30 g 0  . KLOR-CON M20 20 MEQ tablet TAKE 1 TABLET BY MOUTH EVERY DAY 30 tablet 1  . lidocaine-prilocaine (EMLA) cream Apply 1 application topically as needed.  30 g 3  . Magnesium 250 MG TABS Take 1 tablet (250 mg total) by mouth daily. 90 tablet 1  . metFORMIN (GLUCOPHAGE) 1000 MG tablet TAKE 1 TABLET BY MOUTH TWICE A DAY 180 tablet 0  . metoprolol tartrate (LOPRESSOR) 50 MG tablet Take 1 tablet (50 mg total) by mouth 2 (two) times daily. 180 tablet 1  . ondansetron (ZOFRAN) 8 MG tablet TAKE 1 TABLET (8 MG TOTAL) BY MOUTH 2 (TWO) TIMES DAILY AS NEEDED (NAUSEA OR VOMITING). 30 tablet 1  . OneTouch Delica Lancets 17C MISC Use twice daily for glucose control, Dx E11.9 200 each 1  . pantoprazole (PROTONIX) 20 MG tablet Take 1 tablet (20 mg total) by mouth daily. 90 tablet 1  . prochlorperazine (COMPAZINE) 10 MG tablet Take 1 tablet  (10 mg total) by mouth every 6 (six) hours as needed (Nausea or vomiting). 30 tablet 1  . traMADol (ULTRAM) 50 MG tablet Take 0.5-1 tablets (25-50 mg total) by mouth every 6 (six) hours as needed. (Patient not taking: Reported on 12/25/2019) 30 tablet 0  . vitamin B-12 (CYANOCOBALAMIN) 1000 MCG tablet Take 1 tablet (1,000 mcg total) by mouth daily. 90 tablet 1   Current Facility-Administered Medications  Medication Dose Route Frequency Provider Last Rate Last Admin  . LORazepam (ATIVAN) tablet 0.5 mg  0.5 mg Oral Once Alla Feeling, NP       Facility-Administered Medications Ordered in Other Visits  Medication Dose Route Frequency Provider Last Rate Last Admin  . gemcitabine (GEMZAR) 1,064 mg in sodium chloride 0.9 % 250 mL chemo infusion  700 mg/m2 (Order-Specific) Intravenous Once Truitt Merle, MD      . heparin lock flush 100 unit/mL  500 Units Intracatheter Once PRN Truitt Merle, MD      . PACLitaxel-protein bound (ABRAXANE) chemo infusion 100 mg  70 mg/m2 (Order-Specific) Intravenous Once Truitt Merle, MD      . sodium chloride flush (NS) 0.9 % injection 10 mL  10 mL Intracatheter PRN Truitt Merle, MD        PHYSICAL EXAMINATION: ECOG PERFORMANCE STATUS: 1-2  Vitals:   05/18/20 1130  BP: 115/69  Pulse: 88  Resp: 17  Temp: (!) 97 F (36.1 C)  SpO2: 100%   Filed Weights   05/18/20 1130  Weight: 130 lb (59 kg)    GENERAL:alert, no distress and comfortable SKIN: No rash EYES:  sclera clear OROPHARYNX: Small crack at the corner of left side of mouth LUNGS: clear, normal breathing effort HEART: regular rate & rhythm, mild bilateral lower extremity edema ABDOMEN: abdomen soft, bloated, non-tender and normal bowel sounds NEURO: alert & oriented x 3 with fluent speech, no focal motor/sensory deficits PAC without erythema  LABORATORY DATA:  I have reviewed the data as listed CBC Latest Ref Rng & Units 05/18/2020 05/04/2020 04/20/2020  WBC 4.0 - 10.5 K/uL 2.6(L) 12.6(H) 3.3(L)  Hemoglobin  12.0 - 15.0 g/dL 8.7(L) 9.6(L) 9.8(L)  Hematocrit 36 - 46 % 25.6(L) 28.8(L) 29.7(L)  Platelets 150 - 400 K/uL 180 213 225     CMP Latest Ref Rng & Units 05/18/2020 05/04/2020 04/20/2020  Glucose 70 - 99 mg/dL 159(H) 181(H) 162(H)  BUN 8 - 23 mg/dL 8 11 10   Creatinine 0.44 - 1.00 mg/dL 0.73 0.78 0.73  Sodium 135 - 145 mmol/L 135 136 138  Potassium 3.5 - 5.1 mmol/L 4.2 4.4 4.3  Chloride 98 - 111 mmol/L 106 104 103  CO2 22 - 32 mmol/L 21(L) 27 26  Calcium 8.9 -  10.3 mg/dL 7.7(L) 7.7(L) 8.1(L)  Total Protein 6.5 - 8.1 g/dL 5.4(L) 5.6(L) 6.0(L)  Total Bilirubin 0.3 - 1.2 mg/dL 1.0 1.0 0.9  Alkaline Phos 38 - 126 U/L 317(H) 422(H) 376(H)  AST 15 - 41 U/L 70(H) 61(H) 58(H)  ALT 0 - 44 U/L 34 23 21      RADIOGRAPHIC STUDIES: I have personally reviewed the radiological images as listed and agreed with the findings in the report. No results found.   ASSESSMENT & PLAN: Sharmane Almonteis a 74 y.o.femalewith    1. Pancreatic Cancer,stage III,unresectable -She initially presented to hospital in [place] with RUQ abdominal and jaundice. Her work up showed intrahepatic and extrahepatic biliary dilatation. EGDwith biopsy ofduodenal mucosa and head of pancreasshowed no cancer cells at that time. Sheunderwent biliary drain placement by IRin 05/2020 which was removed in 09/2019.  -She underwent biopsy of pancreatic head mass on 08/10/19 which showedinvasive well differentiated adenocarcinoma,peripancreaticLN biopsy wasalso positive.  -She underwentWhipple Surgery on 08/29/19 but was aborted due to tumor invading the SMV. She instead hadgastrojejunostomy and J-tube placementdue tobowelblockages. -Her tumor has invaded her mesentery and vascular structures around her pancreas, making her tumor unresectable. It is no longer curable in this case but still treatable to controlher disease and prolong her life.  -CT CAP from 11/29/19 showsno evidence of distant metastasis, butagain demonstrated  locally advanced disease. Her baseline Ca 19.9 was elevated at 911 which is concerning for high tumor burden and developing distant metastasis.  -Given her age, Dr. Burr Medico recommended (and agreed upon by multidisciplinary team)systemic chemo with Gemcitabinewith or without Abraxane2 weeks on/1 week off for 3-6 months followed byconsolidationRadiation.  -goal is palliative -began single agentgemcitbaine on 01/08/20, she tolerated well with mild but well controlled nausea and fatigue/weakness. She recovered well and50% dose-reduced abraxane was added with C2. -Cancer related right\flank pain resolved with gem/Abraxane, indicating a clinical response to treatment -Chemo was dose reduced, changed to every 2 weeks, and Retacrit was added with cycle 2 given she is Jehovah's Witness and can not receive blood transfusion -With cycle 4 gemcitabine was increased and Retacrit was decreased, tolerating well  2. S/p fall 01/04/20, left foot/ankle injury Tripped getting up from chair, no LOC  -Xray in ED negative -mild swelling and bruising, can bear weight. Continuesupportive care and ambulate with a cane -resolved  3. Nutrition  -During her 08/29/19 surgery her whipple was aborted and instead had bypass of duodenum and CBD due to blockages.She also had J-tube placed at that time. -J tube removed 6/15 -using supplements, weightstabilized lately;continue follow-up with nutrition  4. Comorbidities: HTN, DM, H/o of spinal tumor, Chronic back pain -followed by PCP Dr. 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 When she moved to [place] in 09/1989 she had back surgery and had it removed.  -She haschroniclowback pain; her cancer related right back/flank painimproved with Norco -Resolved since adding Abraxane,she is likely responding to  treatment  5. Social Support  -She is originally from [place] She moved to [place] in 09/1989. She is married with 4 children.  -After medical issues since late 05/2019 her son and his wife who are fluent in [place] moved to [place]. After cancer diagnosis, all of them moved to [place], [place] in early May given 3 of her children live in [place]. She has great family support  -She is a Jehovah Witness, does not accept blood products  6. B/l leg pain, swelling -doppler negative for DVTin 11/2019 and again negative in 04/2021 -Likely secondary to hypoalbuminemia and low activity level -Continue compression stockings.  We will try very low-dose Lasix 10 mg every other day  7.Goal of care discussion  -The goal of care is palliative. -she is full code for now  Disposition: Ms. Tukes appears stable.  He completed another cycle of gemcitabine and Abraxane.  She tolerates treatment mostly well overall with fatigue, bowel changes.  Side effects are well managed with supportive meds at home.  Her nervousness is likely related to cold medicine, she will receive 0.5 mg Ativan with today's treatment.  She is otherwise able to recover and function well.  She has persistent lower leg swelling secondary to hypoalbuminemia and low activity level.  She wears compression stockings.  DVT has been ruled out.  She has also developed mild abdominal bloating.  We will try very low-dose Lasix 10 mg every other day. Continue daily K supplement. I advised her family member to stop the Lasix if it does not appear to be improving.  She has low urine output and questionable episode of hematuria.  A UA is pending.  The CBC and CMP are stable, slightly worse anemia (retacrtit was not given on (05/04/20). She will proceed with another cycle of gemcitabine and Abraxane today with Retacrit, and Udenyca 12/3 or 12/4. The CA 19-9 is stable to slightly improved.  She has been referred for restaging CT CAP prior to  next cycle.  Follow-up in 2 weeks to review CT.   Orders Placed This Encounter  Procedures  . Urine Culture    Standing Status:   Future    Standing Expiration Date:   05/18/2021  . CT CHEST ABDOMEN PELVIS W CONTRAST    Standing Status:   Future    Standing Expiration Date:   05/18/2021    Order Specific Question:   If indicated for the ordered procedure, I authorize the administration of contrast media per Radiology protocol    Answer:   Yes    Order Specific Question:   Preferred imaging location?    Answer:   [facility]    Order Specific Question:   Is Oral Contrast requested for this exam?    Answer:   Yes, Per Radiology protocol    Order Specific Question:   Reason for Exam (SYMPTOM  OR DIAGNOSIS REQUIRED)    Answer:   evaluate treatment response; pancreas cancer on chemo  . Urinalysis, Complete w Microscopic    Standing Status:   Future    Standing Expiration Date:   05/18/2021   All questions were answered. The patient knows to call the clinic with any problems, questions or concerns. No barriers to learning were detected. Total encounter time is 30 minutes.      Alla Feeling, NP 05/18/20

## 2020-05-18 NOTE — Progress Notes (Signed)
Note BSA on 05/18/20 = 1.63 m2 (dose just outside 10% diff) Dose was calc using BSA = 1.5 m2 However, due to shortage of Abraxane, Dr. Burr Medico ok'd to keep dose at 100 mg.  Kennith Center, Pharm.D., CPP 05/18/2020@2 :19 PM

## 2020-05-19 ENCOUNTER — Other Ambulatory Visit: Payer: Self-pay | Admitting: Nurse Practitioner

## 2020-05-19 ENCOUNTER — Ambulatory Visit: Payer: 59

## 2020-05-19 DIAGNOSIS — N3090 Cystitis, unspecified without hematuria: Secondary | ICD-10-CM

## 2020-05-19 LAB — CANCER ANTIGEN 19-9: CA 19-9: 169 U/mL — ABNORMAL HIGH (ref 0–35)

## 2020-05-19 MED ORDER — CIPROFLOXACIN HCL 500 MG PO TABS: 500.0000 mg | ORAL_TABLET | Freq: Two times a day (BID) | ORAL | 0 refills | Status: DC

## 2020-05-20 ENCOUNTER — Other Ambulatory Visit: Payer: Self-pay

## 2020-05-20 ENCOUNTER — Inpatient Hospital Stay: Payer: 59

## 2020-05-20 VITALS — BP 103/68 | HR 98 | Temp 98.4°F | Resp 18

## 2020-05-20 DIAGNOSIS — Z5111 Encounter for antineoplastic chemotherapy: Secondary | ICD-10-CM | POA: Diagnosis not present

## 2020-05-20 DIAGNOSIS — Z7189 Other specified counseling: Secondary | ICD-10-CM

## 2020-05-20 DIAGNOSIS — C250 Malignant neoplasm of head of pancreas: Secondary | ICD-10-CM

## 2020-05-20 MED ORDER — PEGFILGRASTIM-CBQV 6 MG/0.6ML ~~LOC~~ SOSY
6.0000 mg | PREFILLED_SYRINGE | Freq: Once | SUBCUTANEOUS | Status: AC
  Administered 2020-05-20: 6 mg via SUBCUTANEOUS

## 2020-05-20 NOTE — Patient Instructions (Signed)

## 2020-05-21 LAB — URINE CULTURE: Culture: >=100000 — AB

## 2020-05-23 ENCOUNTER — Telehealth: Payer: Self-pay

## 2020-05-23 NOTE — Telephone Encounter (Signed)
Pt aware of lab results and to start and complete abt therapy cipro

## 2020-05-23 NOTE — Telephone Encounter (Signed)
-----   Message from Alla Feeling, NP sent at 05/19/2020 10:01 AM EST ----- Please call and let her know UA is consistent with infection. Will wait for final culture/sensitivity but have her start Cipro twice daily x3 days.   Thanks, Regan Rakers

## 2020-05-29 ENCOUNTER — Inpatient Hospital Stay (HOSPITAL_COMMUNITY)
Admission: EM | Admit: 2020-05-29 | Discharge: 2020-06-08 | DRG: 871 | Disposition: A | Discharge status: Skilled Nursing Facility | Payer: 59 | Attending: Internal Medicine | Admitting: Internal Medicine

## 2020-05-29 ENCOUNTER — Emergency Department (HOSPITAL_COMMUNITY): Payer: 59

## 2020-05-29 ENCOUNTER — Telehealth: Payer: Self-pay

## 2020-05-29 ENCOUNTER — Encounter (HOSPITAL_COMMUNITY): Payer: Self-pay

## 2020-05-29 ENCOUNTER — Other Ambulatory Visit: Payer: Self-pay

## 2020-05-29 DIAGNOSIS — A419 Sepsis, unspecified organism: Secondary | ICD-10-CM | POA: Diagnosis present

## 2020-05-29 DIAGNOSIS — Z66 Do not resuscitate: Secondary | ICD-10-CM | POA: Diagnosis not present

## 2020-05-29 DIAGNOSIS — R188 Other ascites: Secondary | ICD-10-CM | POA: Diagnosis present

## 2020-05-29 DIAGNOSIS — D6481 Anemia due to antineoplastic chemotherapy: Secondary | ICD-10-CM | POA: Diagnosis present

## 2020-05-29 DIAGNOSIS — X58XXXA Exposure to other specified factors, initial encounter: Secondary | ICD-10-CM | POA: Diagnosis present

## 2020-05-29 DIAGNOSIS — Z9049 Acquired absence of other specified parts of digestive tract: Secondary | ICD-10-CM | POA: Diagnosis not present

## 2020-05-29 DIAGNOSIS — R6521 Severe sepsis with septic shock: Secondary | ICD-10-CM

## 2020-05-29 DIAGNOSIS — L89153 Pressure ulcer of sacral region, stage 3: Secondary | ICD-10-CM | POA: Diagnosis present

## 2020-05-29 DIAGNOSIS — R1013 Epigastric pain: Secondary | ICD-10-CM | POA: Diagnosis not present

## 2020-05-29 DIAGNOSIS — T451X5A Adverse effect of antineoplastic and immunosuppressive drugs, initial encounter: Secondary | ICD-10-CM

## 2020-05-29 DIAGNOSIS — L8990 Pressure ulcer of unspecified site, unspecified stage: Secondary | ICD-10-CM | POA: Insufficient documentation

## 2020-05-29 DIAGNOSIS — K7290 Hepatic failure, unspecified without coma: Secondary | ICD-10-CM | POA: Diagnosis not present

## 2020-05-29 DIAGNOSIS — R109 Unspecified abdominal pain: Secondary | ICD-10-CM

## 2020-05-29 DIAGNOSIS — C250 Malignant neoplasm of head of pancreas: Secondary | ICD-10-CM

## 2020-05-29 DIAGNOSIS — C259 Malignant neoplasm of pancreas, unspecified: Secondary | ICD-10-CM | POA: Diagnosis present

## 2020-05-29 DIAGNOSIS — K529 Noninfective gastroenteritis and colitis, unspecified: Secondary | ICD-10-CM | POA: Diagnosis present

## 2020-05-29 DIAGNOSIS — R059 Cough, unspecified: Secondary | ICD-10-CM | POA: Diagnosis not present

## 2020-05-29 DIAGNOSIS — K7200 Acute and subacute hepatic failure without coma: Secondary | ICD-10-CM | POA: Diagnosis not present

## 2020-05-29 DIAGNOSIS — D696 Thrombocytopenia, unspecified: Secondary | ICD-10-CM | POA: Diagnosis not present

## 2020-05-29 DIAGNOSIS — E872 Acidosis, unspecified: Secondary | ICD-10-CM

## 2020-05-29 DIAGNOSIS — I10 Essential (primary) hypertension: Secondary | ICD-10-CM | POA: Diagnosis present

## 2020-05-29 DIAGNOSIS — K652 Spontaneous bacterial peritonitis: Secondary | ICD-10-CM

## 2020-05-29 DIAGNOSIS — K649 Unspecified hemorrhoids: Secondary | ICD-10-CM

## 2020-05-29 DIAGNOSIS — N179 Acute kidney failure, unspecified: Secondary | ICD-10-CM | POA: Diagnosis not present

## 2020-05-29 DIAGNOSIS — J90 Pleural effusion, not elsewhere classified: Secondary | ICD-10-CM | POA: Diagnosis present

## 2020-05-29 DIAGNOSIS — R791 Abnormal coagulation profile: Secondary | ICD-10-CM

## 2020-05-29 DIAGNOSIS — T3695XA Adverse effect of unspecified systemic antibiotic, initial encounter: Secondary | ICD-10-CM | POA: Diagnosis not present

## 2020-05-29 DIAGNOSIS — Z833 Family history of diabetes mellitus: Secondary | ICD-10-CM

## 2020-05-29 DIAGNOSIS — Z7984 Long term (current) use of oral hypoglycemic drugs: Secondary | ICD-10-CM | POA: Diagnosis not present

## 2020-05-29 DIAGNOSIS — E43 Unspecified severe protein-calorie malnutrition: Secondary | ICD-10-CM | POA: Diagnosis present

## 2020-05-29 DIAGNOSIS — R55 Syncope and collapse: Secondary | ICD-10-CM | POA: Diagnosis not present

## 2020-05-29 DIAGNOSIS — Z20822 Contact with and (suspected) exposure to covid-19: Secondary | ICD-10-CM | POA: Diagnosis present

## 2020-05-29 DIAGNOSIS — N390 Urinary tract infection, site not specified: Secondary | ICD-10-CM | POA: Diagnosis present

## 2020-05-29 DIAGNOSIS — J9601 Acute respiratory failure with hypoxia: Secondary | ICD-10-CM | POA: Diagnosis present

## 2020-05-29 DIAGNOSIS — Z7982 Long term (current) use of aspirin: Secondary | ICD-10-CM | POA: Diagnosis not present

## 2020-05-29 DIAGNOSIS — Z7189 Other specified counseling: Secondary | ICD-10-CM

## 2020-05-29 DIAGNOSIS — K861 Other chronic pancreatitis: Secondary | ICD-10-CM | POA: Diagnosis present

## 2020-05-29 DIAGNOSIS — D539 Nutritional anemia, unspecified: Secondary | ICD-10-CM | POA: Diagnosis present

## 2020-05-29 DIAGNOSIS — E11649 Type 2 diabetes mellitus with hypoglycemia without coma: Secondary | ICD-10-CM | POA: Diagnosis present

## 2020-05-29 DIAGNOSIS — Z515 Encounter for palliative care: Secondary | ICD-10-CM

## 2020-05-29 DIAGNOSIS — K521 Toxic gastroenteritis and colitis: Secondary | ICD-10-CM | POA: Diagnosis not present

## 2020-05-29 DIAGNOSIS — K760 Fatty (change of) liver, not elsewhere classified: Secondary | ICD-10-CM | POA: Diagnosis present

## 2020-05-29 DIAGNOSIS — Y92239 Unspecified place in hospital as the place of occurrence of the external cause: Secondary | ICD-10-CM | POA: Diagnosis not present

## 2020-05-29 DIAGNOSIS — R7989 Other specified abnormal findings of blood chemistry: Secondary | ICD-10-CM | POA: Diagnosis present

## 2020-05-29 DIAGNOSIS — E162 Hypoglycemia, unspecified: Secondary | ICD-10-CM | POA: Diagnosis present

## 2020-05-29 DIAGNOSIS — D6959 Other secondary thrombocytopenia: Secondary | ICD-10-CM | POA: Diagnosis present

## 2020-05-29 DIAGNOSIS — D630 Anemia in neoplastic disease: Secondary | ICD-10-CM | POA: Diagnosis present

## 2020-05-29 DIAGNOSIS — E119 Type 2 diabetes mellitus without complications: Secondary | ICD-10-CM

## 2020-05-29 DIAGNOSIS — J189 Pneumonia, unspecified organism: Secondary | ICD-10-CM | POA: Diagnosis present

## 2020-05-29 DIAGNOSIS — D689 Coagulation defect, unspecified: Secondary | ICD-10-CM | POA: Diagnosis present

## 2020-05-29 DIAGNOSIS — Z79899 Other long term (current) drug therapy: Secondary | ICD-10-CM

## 2020-05-29 DIAGNOSIS — D72829 Elevated white blood cell count, unspecified: Secondary | ICD-10-CM

## 2020-05-29 DIAGNOSIS — K5730 Diverticulosis of large intestine without perforation or abscess without bleeding: Secondary | ICD-10-CM | POA: Diagnosis present

## 2020-05-29 DIAGNOSIS — G893 Neoplasm related pain (acute) (chronic): Secondary | ICD-10-CM | POA: Diagnosis not present

## 2020-05-29 DIAGNOSIS — Z531 Procedure and treatment not carried out because of patient's decision for reasons of belief and group pressure: Secondary | ICD-10-CM | POA: Diagnosis present

## 2020-05-29 DIAGNOSIS — Z91013 Allergy to seafood: Secondary | ICD-10-CM

## 2020-05-29 LAB — BLOOD GAS, VENOUS
Acid-base deficit: 4.6 mmol/L — ABNORMAL HIGH (ref 0.0–2.0)
Bicarbonate: 20.5 mmol/L (ref 20.0–28.0)
O2 Saturation: 80.4 %
Patient temperature: 98.6
pCO2, Ven: 40.5 mmHg — ABNORMAL LOW (ref 44.0–60.0)
pH, Ven: 7.324 (ref 7.250–7.430)
pO2, Ven: 51.7 mmHg — ABNORMAL HIGH (ref 32.0–45.0)

## 2020-05-29 LAB — BODY FLUID CELL COUNT WITH DIFFERENTIAL
Eos, Fluid: 0 %
Lymphs, Fluid: 3 %
Monocyte-Macrophage-Serous Fluid: 8 % — ABNORMAL LOW (ref 50–90)
Neutrophil Count, Fluid: 89 % — ABNORMAL HIGH (ref 0–25)
Total Nucleated Cell Count, Fluid: 9450 cu mm — ABNORMAL HIGH (ref 0–1000)

## 2020-05-29 LAB — LACTIC ACID, PLASMA
Lactic Acid, Venous: 5.5 mmol/L (ref 0.5–1.9)
Lactic Acid, Venous: 6.6 mmol/L (ref 0.5–1.9)
Lactic Acid, Venous: 5.9 mmol/L (ref 0.5–1.9)
Lactic Acid, Venous: 5 mmol/L (ref 0.5–1.9)

## 2020-05-29 LAB — COMPREHENSIVE METABOLIC PANEL
ALT: 31 U/L (ref 0–44)
AST: 85 U/L — ABNORMAL HIGH (ref 15–41)
Albumin: 1.7 g/dL — ABNORMAL LOW (ref 3.5–5.0)
Alkaline Phosphatase: 373 U/L — ABNORMAL HIGH (ref 38–126)
Anion gap: 11 (ref 5–15)
BUN: 14 mg/dL (ref 8–23)
CO2: 19 mmol/L — ABNORMAL LOW (ref 22–32)
Calcium: 7.5 mg/dL — ABNORMAL LOW (ref 8.9–10.3)
Chloride: 106 mmol/L (ref 98–111)
Creatinine, Ser: 0.89 mg/dL (ref 0.44–1.00)
GFR, Estimated: >60 mL/min (ref >60)
Glucose, Bld: 59 mg/dL — ABNORMAL LOW (ref 70–99)
Potassium: 3.9 mmol/L (ref 3.5–5.1)
Sodium: 136 mmol/L (ref 135–145)
Total Bilirubin: 1.7 mg/dL — ABNORMAL HIGH (ref 0.3–1.2)
Total Protein: 5.1 g/dL — ABNORMAL LOW (ref 6.5–8.1)

## 2020-05-29 LAB — URINALYSIS, ROUTINE W REFLEX MICROSCOPIC
Bilirubin Urine: NEGATIVE
Glucose, UA: NEGATIVE mg/dL
Hgb urine dipstick: NEGATIVE
Ketones, ur: NEGATIVE mg/dL
Leukocytes,Ua: NEGATIVE
Nitrite: NEGATIVE
Protein, ur: NEGATIVE mg/dL
Specific Gravity, Urine: 1.018 (ref 1.005–1.030)
pH: 5 (ref 5.0–8.0)

## 2020-05-29 LAB — CBC WITH DIFFERENTIAL/PLATELET
Abs Immature Granulocytes: 2.68 10*3/uL — ABNORMAL HIGH (ref 0.00–0.07)
Basophils Absolute: 0 10*3/uL (ref 0.0–0.1)
Basophils Relative: 0 %
Eosinophils Absolute: 0 10*3/uL (ref 0.0–0.5)
Eosinophils Relative: 0 %
HCT: 29.6 % — ABNORMAL LOW (ref 36.0–46.0)
Hemoglobin: 10.2 g/dL — ABNORMAL LOW (ref 12.0–15.0)
Immature Granulocytes: 10 %
Lymphocytes Relative: 1 %
Lymphs Abs: 0.3 10*3/uL — ABNORMAL LOW (ref 0.7–4.0)
MCH: 34.3 pg — ABNORMAL HIGH (ref 26.0–34.0)
MCHC: 34.5 g/dL (ref 30.0–36.0)
MCV: 99.7 fL (ref 80.0–100.0)
Monocytes Absolute: 0.7 10*3/uL (ref 0.1–1.0)
Monocytes Relative: 3 %
Neutro Abs: 22.5 10*3/uL — ABNORMAL HIGH (ref 1.7–7.7)
Neutrophils Relative %: 86 %
Platelets: 71 10*3/uL — ABNORMAL LOW (ref 150–400)
RBC: 2.97 MIL/uL — ABNORMAL LOW (ref 3.87–5.11)
RDW: 19.5 % — ABNORMAL HIGH (ref 11.5–15.5)
WBC: 26.2 10*3/uL — ABNORMAL HIGH (ref 4.0–10.5)
nRBC: 3.3 % — ABNORMAL HIGH (ref 0.0–0.2)

## 2020-05-29 LAB — RESP PANEL BY RT-PCR (FLU A&B, COVID) ARPGX2
Influenza A by PCR: NEGATIVE
Influenza B by PCR: NEGATIVE
SARS Coronavirus 2 by RT PCR: NEGATIVE

## 2020-05-29 LAB — CBG MONITORING, ED
Glucose-Capillary: 36 mg/dL — CL (ref 70–99)
Glucose-Capillary: 97 mg/dL (ref 70–99)
Glucose-Capillary: 95 mg/dL (ref 70–99)
Glucose-Capillary: 82 mg/dL (ref 70–99)

## 2020-05-29 LAB — AMMONIA: Ammonia: 36 umol/L — ABNORMAL HIGH (ref 9–35)

## 2020-05-29 LAB — ACETAMINOPHEN LEVEL: Acetaminophen (Tylenol), Serum: <10 ug/mL — ABNORMAL LOW (ref 10–30)

## 2020-05-29 LAB — FIBRINOGEN: Fibrinogen: 115 mg/dL — ABNORMAL LOW (ref 210–475)

## 2020-05-29 LAB — APTT: aPTT: 44 seconds — ABNORMAL HIGH (ref 24–36)

## 2020-05-29 LAB — PROCALCITONIN: Procalcitonin: 4.6 ng/mL

## 2020-05-29 LAB — PROTIME-INR
INR: 2.5 — ABNORMAL HIGH (ref 0.8–1.2)
Prothrombin Time: 26.4 seconds — ABNORMAL HIGH (ref 11.4–15.2)

## 2020-05-29 MED ORDER — VANCOMYCIN HCL IN DEXTROSE 1-5 GM/200ML-% IV SOLN: 1000.0000 mg | INTRAVENOUS | Status: DC

## 2020-05-29 MED ORDER — SODIUM CHLORIDE 0.9 % IV BOLUS
1000.0000 mL | Freq: Once | INTRAVENOUS | Status: AC
  Administered 2020-05-29: 13:00:00 1000 mL via INTRAVENOUS

## 2020-05-29 MED ORDER — SODIUM CHLORIDE (PF) 0.9 % IJ SOLN
INTRAMUSCULAR | Status: AC
  Filled 2020-05-29: qty 50

## 2020-05-29 MED ORDER — MORPHINE SULFATE (PF) 4 MG/ML IV SOLN
4.0000 mg | Freq: Once | INTRAVENOUS | Status: AC
  Administered 2020-05-29: 12:00:00 4 mg via INTRAVENOUS
  Filled 2020-05-29: qty 1

## 2020-05-29 MED ORDER — VANCOMYCIN HCL 1250 MG/250ML IV SOLN
1250.0000 mg | Freq: Once | INTRAVENOUS | Status: AC
  Administered 2020-05-29: 1250 mg via INTRAVENOUS
  Filled 2020-05-29: qty 250

## 2020-05-29 MED ORDER — LACTATED RINGERS IV SOLN: INTRAVENOUS | Status: AC

## 2020-05-29 MED ORDER — HYDROCORTISONE (PERIANAL) 2.5 % EX CREA
1.0000 "application " | TOPICAL_CREAM | Freq: Two times a day (BID) | CUTANEOUS | Status: DC
  Administered 2020-05-30 – 2020-06-08 (×18): 1 via RECTAL
  Filled 2020-05-29 (×3): qty 28.35

## 2020-05-29 MED ORDER — ALBUTEROL SULFATE (2.5 MG/3ML) 0.083% IN NEBU: 2.5000 mg | INHALATION_SOLUTION | RESPIRATORY_TRACT | Status: DC | PRN

## 2020-05-29 MED ORDER — LACTATED RINGERS IV BOLUS (SEPSIS)
250.0000 mL | Freq: Once | INTRAVENOUS | Status: AC
  Administered 2020-05-29: 12:00:00 250 mL via INTRAVENOUS

## 2020-05-29 MED ORDER — SODIUM CHLORIDE 0.9 % IV SOLN
75.0000 mL/h | INTRAVENOUS | Status: AC
  Administered 2020-05-30: 01:00:00 75 mL/h via INTRAVENOUS

## 2020-05-29 MED ORDER — LACTATED RINGERS IV BOLUS (SEPSIS)
500.0000 mL | Freq: Once | INTRAVENOUS | Status: AC
  Administered 2020-05-29: 12:00:00 500 mL via INTRAVENOUS

## 2020-05-29 MED ORDER — SODIUM CHLORIDE 0.9 % IV SOLN
1.0000 g | INTRAVENOUS | Status: DC
  Administered 2020-05-29: 12:00:00 1 g via INTRAVENOUS
  Filled 2020-05-29: qty 10

## 2020-05-29 MED ORDER — PIPERACILLIN-TAZOBACTAM 3.375 G IVPB
3.3750 g | Freq: Three times a day (TID) | INTRAVENOUS | Status: DC
  Administered 2020-05-29 – 2020-05-30 (×2): 3.375 g via INTRAVENOUS
  Filled 2020-05-29 (×2): qty 50

## 2020-05-29 MED ORDER — PANTOPRAZOLE SODIUM 20 MG PO TBEC
20.0000 mg | DELAYED_RELEASE_TABLET | Freq: Every day | ORAL | Status: DC
  Administered 2020-05-30 – 2020-06-08 (×10): 20 mg via ORAL
  Filled 2020-05-29 (×10): qty 1

## 2020-05-29 MED ORDER — LACTATED RINGERS IV BOLUS (SEPSIS)
1000.0000 mL | Freq: Once | INTRAVENOUS | Status: AC
  Administered 2020-05-29: 12:00:00 1000 mL via INTRAVENOUS

## 2020-05-29 MED ORDER — LIDOCAINE-EPINEPHRINE (PF) 2 %-1:200000 IJ SOLN
10.0000 mL | Freq: Once | INTRAMUSCULAR | Status: DC
  Filled 2020-05-29: qty 20

## 2020-05-29 MED ORDER — INSULIN ASPART 100 UNIT/ML ~~LOC~~ SOLN
0.0000 [IU] | SUBCUTANEOUS | Status: DC
  Administered 2020-05-30 – 2020-06-01 (×8): 1 [IU] via SUBCUTANEOUS
  Administered 2020-06-01 – 2020-06-02 (×2): 2 [IU] via SUBCUTANEOUS
  Administered 2020-06-02 – 2020-06-04 (×5): 1 [IU] via SUBCUTANEOUS
  Administered 2020-06-04 – 2020-06-05 (×3): 2 [IU] via SUBCUTANEOUS
  Administered 2020-06-05: 22:00:00 1 [IU] via SUBCUTANEOUS
  Administered 2020-06-06 (×2): 2 [IU] via SUBCUTANEOUS
  Administered 2020-06-06: 01:00:00 3 [IU] via SUBCUTANEOUS
  Administered 2020-06-06 – 2020-06-07 (×3): 1 [IU] via SUBCUTANEOUS
  Filled 2020-05-29: qty 0.09

## 2020-05-29 MED ORDER — ONDANSETRON HCL 4 MG/2ML IJ SOLN
4.0000 mg | Freq: Once | INTRAMUSCULAR | Status: AC
  Administered 2020-05-29: 12:00:00 4 mg via INTRAVENOUS
  Filled 2020-05-29: qty 2

## 2020-05-29 MED ORDER — SODIUM CHLORIDE 0.9 % IV SOLN
500.0000 mg | Freq: Once | INTRAVENOUS | Status: AC
  Administered 2020-05-29: 13:00:00 500 mg via INTRAVENOUS
  Filled 2020-05-29: qty 500

## 2020-05-29 MED ORDER — DEXTROSE 50 % IV SOLN
50.0000 mL | Freq: Once | INTRAVENOUS | Status: AC
  Administered 2020-05-29: 17:00:00 50 mL via INTRAVENOUS
  Filled 2020-05-29: qty 50

## 2020-05-29 MED ORDER — PIPERACILLIN-TAZOBACTAM 3.375 G IVPB 30 MIN
3.3750 g | Freq: Once | INTRAVENOUS | Status: AC
  Administered 2020-05-29: 16:00:00 3.375 g via INTRAVENOUS
  Filled 2020-05-29: qty 50

## 2020-05-29 MED ORDER — IOHEXOL 350 MG/ML SOLN
100.0000 mL | Freq: Once | INTRAVENOUS | Status: AC | PRN
  Administered 2020-05-29: 14:00:00 100 mL via INTRAVENOUS

## 2020-05-29 NOTE — ED Triage Notes (Signed)
PT BIB EMS. Pt c/o abd pain and weakness. Hx of pancreatic cancer; does take chemo.   BP-134/70 HR-110 CBG-164 Temp-97.1

## 2020-05-29 NOTE — Progress Notes (Signed)
Pharmacy Antibiotic Note  Tamara Wood is a 74 y.o. female admitted on 05/29/2020 with sepsis. Pharmacy has been consulted for Vancomycin and Zosyn dosing.  Plan: Vancomycin 1250mg  IV x 1 given in the ED. Continue with Vancomycin 1g IV q24h. Vancomycin levels at steady state, as indicated. Zosyn 3.375g IV x 1 over 30 minutes given in the ED. Continue with Zosyn 3.375g IV q8h (each dose infused over 4 hours). Monitor renal function (daily SCr), cultures, clinical course.    Height: 5\' 4"  (162.6 cm) Weight: 56.2 kg (124 lb) IBW/kg (Calculated) : 54.7  Temp (24hrs), Avg:97.6 F (36.4 C), Min:97.6 F (36.4 C), Max:97.6 F (36.4 C)  Recent Labs  Lab 05/29/20 1013 05/29/20 1213 05/29/20 1810  WBC 26.2*  --   --   CREATININE 0.89  --   --   LATICACIDVEN 5.5* 6.6* 5.9*    Estimated Creatinine Clearance: 47.2 mL/min (by C-G formula based on SCr of 0.89 mg/dL).    Allergies  Allergen Reactions  . Shellfish Allergy Other (See Comments)    Upset stomach    Antimicrobials this admission: 12/13 Ceftriaxone x 1 12/13 Azithromycin x 1 12/13 Zosyn >> 12/13 Vancomycin >>  Dose adjustments this admission: --  Microbiology results: 12/13 BCx: sent 12/13 UCx: sent 12/13 Peritoneal fluid: sent 12/13 Respiratory panel: COVID neg, Influenza A/B neg    Thank you for allowing pharmacy to be a part of this patient's care.   Lindell Spar, PharmD, BCPS Clinical Pharmacist  05/29/2020 8:25 PM

## 2020-05-29 NOTE — ED Provider Notes (Signed)
[facility] DEPT Provider Note   CSN: [id number] Arrival date & time: 05/29/20  0846     History Chief Complaint  Patient presents with  . Abdominal Pain  . Weakness    Tamara Wood is a 74 y.o. female.  HPI      74 year old female with history of diabetes, hypertension, pancreatic cancer stage III unresectable with palliative goal on chemo, recent diagnosis of urinary tract infection (E. Coli sensitive to cipro on cipro) who presents with concern for abdominal pain.   When went to restroom began to feel dizzy, faint, no syncope Pain started to lower abdomen and went up to the stomach 7AM began to have pain Had BM, began to feel faint Diarrhea the day before yesterday but normal now No black or bloody stools Sometimes when clean there is a little bit of blood, bright red, not new. Nausea sometimes No vomiting Not sure if have fever. ALways has chills. No urinary symptoms. Has been taking cipro for several days, stopped it Eating and drinking ok, sometimes will vomit Cough for 5 days, dry cough for 1 week Last night nyquil, slept well, less coughing Last night felt like wasn't able to breath well, because suffocating from the stomach Has to put more strain with breathing Feeling now she has pain around abdomen but otherwise feels better, no longer feeling dizzy    Past Medical History:  Diagnosis Date  . Diabetes mellitus without complication ([place])   . Hypertension   . Pancreatic cancer [facility])     Patient Active Problem List   Diagnosis Date Noted  . Ascites 05/29/2020  . Sepsis ([place]) 05/29/2020  . Acute lower UTI 05/29/2020  . Elevated LFTs 05/29/2020  . Thrombocytopenia ([place]) 05/29/2020  . Port-A-Cath in place 12/16/2019  . Goals of care, counseling/discussion 11/30/2019  . Hypertension 11/30/2019  . DM (diabetes mellitus), type 2 ([place]) 11/30/2019  . Pancreatic cancer ([place]) 11/21/2019    Past Surgical History:  Procedure  Laterality Date  . APPENDECTOMY    . BACK SURGERY    . CHOLECYSTECTOMY    . GASTRIC BYPASS OPEN  08/2019   aborted whipple, GJ bypass for obstruction  . IR GASTROSTOMY TUBE REMOVAL  11/30/2019  . IR IMAGING GUIDED PORT INSERTION  12/08/2019     OB History   No obstetric history on file.     Family History  Problem Relation Age of Onset  . Diabetes Mother   . Diabetes Sister   . Diabetes Brother     Social History   Tobacco Use  . Smoking status: Never Smoker  . Smokeless tobacco: Never Used  Substance Use Topics  . Alcohol use: Never  . Drug use: Never    Home Medications Prior to Admission medications   Medication Sig Start Date End Date Taking? Authorizing Provider  acetaminophen (TYLENOL) 500 MG tablet Take 500-1,000 mg by mouth daily as needed for moderate pain.   Yes [provider]  amLODipine (NORVASC) 5 MG tablet Take 1 tablet (5 mg total) by mouth daily. 12/22/19  Yes Erline Hau, MD  ASPIRIN LOW DOSE 81 MG EC tablet Take 81 mg by mouth daily. 07/31/19  Yes [provider]  Calcium Carb-Cholecalciferol (CALCIUM/VITAMIN D) 500-200 MG-UNIT TABS Take 1 tablet by mouth in the morning and at bedtime.  11/10/19  Yes [provider]  DM-Doxylamine-Acetaminophen (NYQUIL HBP COLD & FLU) 15-6.25-325 MG/15ML LIQD Take 5 mLs by mouth 2 (two) times daily as needed (cold).  Yes [provider]  furosemide (LASIX) 20 MG tablet Take 0.5 (half) tablet every other day for swelling Patient taking differently: Take 10 mg by mouth every other day. 05/18/20  Yes Alla Feeling, NP  hydrocortisone (ANUSOL-HC) 2.5 % rectal cream Place 1 application rectally 2 (two) times daily. Patient taking differently: Place 1 application rectally 2 (two) times daily as needed for anal itching. 04/06/20  Yes Isaac Bliss, Rayford Halsted, MD  KLOR-CON M20 20 MEQ tablet TAKE 1 TABLET BY MOUTH EVERY DAY Patient taking differently: Take 20 mEq by mouth daily.  03/09/20  Yes Alla Feeling, NP  lidocaine-prilocaine (EMLA) cream Apply 1 application topically as needed. Patient taking differently: Apply 1 application topically daily as needed (pain). 12/16/19  Yes Alla Feeling, NP  Magnesium 250 MG TABS Take 1 tablet (250 mg total) by mouth daily. 12/07/19  Yes Isaac Bliss, Rayford Halsted, MD  metFORMIN (GLUCOPHAGE) 1000 MG tablet TAKE 1 TABLET BY MOUTH TWICE A DAY Patient taking differently: Take 1,000 mg by mouth daily. 03/08/20  Yes Isaac Bliss, Rayford Halsted, MD  ondansetron (ZOFRAN) 8 MG tablet TAKE 1 TABLET (8 MG TOTAL) BY MOUTH 2 (TWO) TIMES DAILY AS NEEDED (NAUSEA OR VOMITING). Patient taking differently: Take 8 mg by mouth 2 (two) times daily as needed for nausea or vomiting (Nausea or vomiting). 05/17/20  Yes Truitt Merle, MD  pantoprazole (PROTONIX) 20 MG tablet Take 1 tablet (20 mg total) by mouth daily. 11/22/19  Yes Truitt Merle, MD  prochlorperazine (COMPAZINE) 10 MG tablet Take 1 tablet (10 mg total) by mouth every 6 (six) hours as needed (Nausea or vomiting). Patient taking differently: Take 10 mg by mouth every 6 (six) hours as needed for nausea or vomiting (Nausea or vomiting). 04/06/20  Yes Truitt Merle, MD  traMADol (ULTRAM) 50 MG tablet Take 0.5-1 tablets (25-50 mg total) by mouth every 6 (six) hours as needed. Patient taking differently: Take 25-50 mg by mouth every 6 (six) hours as needed for moderate pain. 11/22/19  Yes Truitt Merle, MD  vitamin B-12 (CYANOCOBALAMIN) 1000 MCG tablet Take 1 tablet (1,000 mcg total) by mouth daily. 11/22/19  Yes Truitt Merle, MD  ciprofloxacin (CIPRO) 500 MG tablet Take 1 tablet (500 mg total) by mouth 2 (two) times daily. Patient taking differently: Take 500 mg by mouth 2 (two) times daily. Start date : 05/23/20 05/19/20   Alla Feeling, NP  glucose blood test strip 1 each by Other route in the morning and at bedtime. Dx E11.9  For OneTouch Ultra 2 01/28/20   Isaac Bliss, Rayford Halsted, MD  HYDROcodone-acetaminophen  (NORCO/VICODIN) 5-325 MG tablet Take 1 tablet by mouth every 6 (six) hours as needed. Patient not taking: No sig reported 12/30/19   Truitt Merle, MD  metoprolol tartrate (LOPRESSOR) 50 MG tablet Take 1 tablet (50 mg total) by mouth 2 (two) times daily. 12/07/19   Isaac Bliss, Rayford Halsted, MD  OneTouch Delica Lancets 70B MISC Use twice daily for glucose control, Dx E11.9 02/03/20   Isaac Bliss, Rayford Halsted, MD    Allergies    Shellfish allergy  Review of Systems   Review of Systems  Constitutional: Positive for fatigue.  HENT: Negative for sore throat and trouble swallowing.   Respiratory: Positive for cough and shortness of breath.   Cardiovascular: Negative for chest pain.  Gastrointestinal: Positive for abdominal pain and nausea. Negative for constipation, diarrhea and vomiting.  Genitourinary: Negative for dysuria (but recent UTI).  Musculoskeletal: Negative for back  pain.  Skin: Negative for rash.  Neurological: Positive for light-headedness. Negative for syncope and headaches.    Physical Exam Updated Vital Signs BP (!) 87/56   Pulse (!) 103   Temp 97.6 F (36.4 C) (Oral)   Resp 17   Ht _0  (1.626 m)   Wt 56.2 kg   SpO2 98%   BMI 21.28 kg/m   Physical Exam Vitals and nursing note reviewed.  Constitutional:      General: She is not in acute distress.    Appearance: She is well-developed and well-nourished. She is not diaphoretic.  HENT:     Head: Normocephalic and atraumatic.  Eyes:     Extraocular Movements: EOM normal.     Conjunctiva/sclera: Conjunctivae normal.  Cardiovascular:     Rate and Rhythm: Regular rhythm. Tachycardia present.     Pulses: Intact distal pulses.  Pulmonary:     Effort: Pulmonary effort is normal. No respiratory distress.     Breath sounds: Normal breath sounds. No wheezing or rales.  Abdominal:     General: There is no distension.     Palpations: Abdomen is soft.     Tenderness: There is generalized abdominal tenderness. There is  no guarding.  Musculoskeletal:        General: No tenderness.     Cervical back: Normal range of motion.     Right lower leg: Edema present.     Left lower leg: Edema present.  Skin:    General: Skin is warm and dry.     Findings: No erythema or rash.  Neurological:     Mental Status: She is alert and oriented to person, place, and time.     ED Results / Procedures / Treatments   Labs (all labs ordered are listed, but only abnormal results are displayed) Labs Reviewed  LACTIC ACID, PLASMA - Abnormal; Notable for the following components:      Result Value   Lactic Acid, Venous 5.5 (*)    All other components within normal limits  LACTIC ACID, PLASMA - Abnormal; Notable for the following components:   Lactic Acid, Venous 6.6 (*)    All other components within normal limits  COMPREHENSIVE METABOLIC PANEL - Abnormal; Notable for the following components:   CO2 19 (*)    Glucose, Bld 59 (*)    Calcium 7.5 (*)    Total Protein 5.1 (*)    Albumin 1.7 (*)    AST 85 (*)    Alkaline Phosphatase 373 (*)    Total Bilirubin 1.7 (*)    All other components within normal limits  CBC WITH DIFFERENTIAL/PLATELET - Abnormal; Notable for the following components:   WBC 26.2 (*)    RBC 2.97 (*)    Hemoglobin 10.2 (*)    HCT 29.6 (*)    MCH 34.3 (*)    RDW 19.5 (*)    Platelets 71 (*)    nRBC 3.3 (*)    Neutro Abs 22.5 (*)    Lymphs Abs 0.3 (*)    Abs Immature Granulocytes 2.68 (*)    All other components within normal limits  PROTIME-INR - Abnormal; Notable for the following components:   Prothrombin Time 26.4 (*)    INR 2.5 (*)    All other components within normal limits  APTT - Abnormal; Notable for the following components:   aPTT 44 (*)    All other components within normal limits  URINALYSIS, ROUTINE W REFLEX MICROSCOPIC - Abnormal; Notable for the following  components:   Color, Urine AMBER (*)    APPearance HAZY (*)    All other components within normal limits  BODY  FLUID CELL COUNT WITH DIFFERENTIAL - Abnormal; Notable for the following components:   Color, Fluid STRAW (*)    Appearance, Fluid TURBID (*)    Total Nucleated Cell Count, Fluid 9,450 (*)    Neutrophil Count, Fluid 89 (*)    Monocyte-Macrophage-Serous Fluid 8 (*)    All other components within normal limits  LACTIC ACID, PLASMA - Abnormal; Notable for the following components:   Lactic Acid, Venous 5.9 (*)    All other components within normal limits  LACTIC ACID, PLASMA - Abnormal; Notable for the following components:   Lactic Acid, Venous 5.0 (*)    All other components within normal limits  BLOOD GAS, VENOUS - Abnormal; Notable for the following components:   pCO2, Ven 40.5 (*)    pO2, Ven 51.7 (*)    Acid-base deficit 4.6 (*)    All other components within normal limits  AMMONIA - Abnormal; Notable for the following components:   Ammonia 36 (*)    All other components within normal limits  CBG MONITORING, ED - Abnormal; Notable for the following components:   Glucose-Capillary 36 (*)    All other components within normal limits  RESP PANEL BY RT-PCR (FLU A&B, COVID) ARPGX2  BODY FLUID CULTURE  CULTURE, BLOOD (ROUTINE X 2)  CULTURE, BLOOD (ROUTINE X 2)  URINE CULTURE  CANCER ANTIGEN 19-9  LACTIC ACID, PLASMA  PATHOLOGIST SMEAR REVIEW  PREALBUMIN  LACTIC ACID, PLASMA  PROCALCITONIN  ACETAMINOPHEN LEVEL  HEMOGLOBIN A1C  HEPATITIS PANEL, ACUTE  FIBRINOGEN  CBG MONITORING, ED  CBG MONITORING, ED  CBG MONITORING, ED  CBG MONITORING, ED  CBG MONITORING, ED    EKG EKG Interpretation  Date/Time:  Monday May 29 2020 10:40:12 EST Ventricular Rate:  121 PR Interval:    QRS Duration: 43 QT Interval:  333 QTC Calculation: 473 R Axis:     Text Interpretation: Sinus tachycardia Atrial premature complexes Low voltage, extremity and precordial leads No previous ECGs available Confirmed by Gareth Morgan [phone]) on 05/29/2020 12:33:27 PM   Radiology CT Angio  Chest PE W and/or Wo Contrast  Result Date: 05/29/2020 CLINICAL DATA:  Syncopal episode, acute abdominal pain, weakness, history of pancreatic cancer on chemotherapy EXAM: CT ANGIOGRAPHY CHEST CT ABDOMEN AND PELVIS WITH CONTRAST TECHNIQUE: Multidetector CT imaging of the chest was performed using the standard protocol during bolus administration of intravenous contrast. Multiplanar CT image reconstructions and MIPs were obtained to evaluate the vascular anatomy. Multidetector CT imaging of the abdomen and pelvis was performed using the standard protocol during bolus administration of intravenous contrast. CONTRAST:  180m OMNIPAQUE IOHEXOL 350 MG/ML SOLN IV. No oral contrast. COMPARISON:  CT abdomen and pelvis 03/06/2020, CT chest 11/29/2019 FINDINGS: CTA CHEST FINDINGS Cardiovascular: Atherosclerotic calcifications aorta and coronary arteries. RIGHT jugular Port-A-Cath with tip in RIGHT atrium. Aorta normal caliber without aneurysm or dissection. Heart normal size. No pericardial effusion. Pulmonary arteries adequately opacified and grossly patent. No evidence of pulmonary embolism. Mediastinum/Nodes: Soft is unremarkable. Base of cervical region normal appearance. No thoracic adenopathy. Lungs/Pleura: Moderate pleural effusions bilaterally. Compressive atelectasis of BILATERAL lower lobes and posterior upper lobes minimal infiltrate in upper lobes. No pneumothorax. No definite pulmonary mass/nodule. Musculoskeletal: Diffuse osseous demineralization. No discrete bone lesion. Review of the MIP images confirms the above findings. CT ABDOMEN and PELVIS FINDINGS Hepatobiliary: Marked hepatic steatosis. Gallbladder surgically absent. CBD  stent. No hepatic mass or biliary dilatation. Small nodule anterior to the LEFT lobe of the liver on the previous exam is poorly demonstrated on the current study, subjectively smaller but difficult to accurately measure due to ascites. Pancreas: Mass at pancreatic head/uncinate 4.0  x 4.1 cm, appears slightly smaller. Mild pancreatic ductal dilatation at body and proximal tail. No additional mass. 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 Adrenal glands otherwise unremarkable. Small BILATERAL renal cysts. No hydronephrosis, hydroureter, or urinary tract calcification. Bladder decompressed. Stomach/Bowel: Scattered bowel wall thickening, mild in degree, may be related to ascites. Diverticulosis of descending and sigmoid colon without definite wall thickening to suggest diverticulitis. Appendix surgically absent by history. Prior gastric bypass surgery. Vascular/Lymphatic: Extensive atherosclerotic calcifications aorta and iliac arteries without aneurysm. No definite adenopathy. Reproductive: Atrophic uterus with unremarkable ovaries Other: Significant ascites. No definite peritoneal based mass. Scattered subcutaneous edema. No free air. No hernia. Musculoskeletal: Osseous demineralization. Review of the MIP images confirms the above findings. IMPRESSION: No evidence of pulmonary embolism. BILATERAL pleural effusions with compressive atelectasis of the lower lobes and posterior upper lobes. Slight decrease in size of pancreatic head mass. Marked hepatic steatosis. Slight decrease in size of nodule at the anterior surface LEFT lobe liver. CBD stent without biliary dilatation. Scattered bowel wall thickening, may be related to ascites, unable to exclude enteritis. Distal colonic diverticulosis without evidence of diverticulitis. LEFT adrenal myelolipoma. Aortic Atherosclerosis (ICD10-I70.0). Electronically Signed   By: Lavonia Dana M.D.   On: 05/29/2020 15:00   CT ABDOMEN PELVIS W CONTRAST  Result Date: 05/29/2020 CLINICAL DATA:  Syncopal episode, acute abdominal pain, weakness, history of pancreatic cancer on chemotherapy EXAM: CT ANGIOGRAPHY CHEST CT ABDOMEN AND PELVIS WITH CONTRAST TECHNIQUE: Multidetector CT  imaging of the chest was performed using the standard protocol during bolus administration of intravenous contrast. Multiplanar CT image reconstructions and MIPs were obtained to evaluate the vascular anatomy. Multidetector CT imaging of the abdomen and pelvis was performed using the standard protocol during bolus administration of intravenous contrast. CONTRAST:  195m OMNIPAQUE IOHEXOL 350 MG/ML SOLN IV. No oral contrast. COMPARISON:  CT abdomen and pelvis 03/06/2020, CT chest 11/29/2019 FINDINGS: CTA CHEST FINDINGS Cardiovascular: Atherosclerotic calcifications aorta and coronary arteries. RIGHT jugular Port-A-Cath with tip in RIGHT atrium. Aorta normal caliber without aneurysm or dissection. Heart normal size. No pericardial effusion. Pulmonary arteries adequately opacified and grossly patent. No evidence of pulmonary embolism. Mediastinum/Nodes: Soft is unremarkable. Base of cervical region normal appearance. No thoracic adenopathy. Lungs/Pleura: Moderate pleural effusions bilaterally. Compressive atelectasis of BILATERAL lower lobes and posterior upper lobes minimal infiltrate in upper lobes. No pneumothorax. No definite pulmonary mass/nodule. Musculoskeletal: Diffuse osseous demineralization. No discrete bone lesion. Review of the MIP images confirms the above findings. CT ABDOMEN and PELVIS FINDINGS Hepatobiliary: Marked hepatic steatosis. Gallbladder surgically absent. CBD stent. No hepatic mass or biliary dilatation. Small nodule anterior to the LEFT lobe of the liver on the previous exam is poorly demonstrated on the current study, subjectively smaller but difficult to accurately measure due to ascites. Pancreas: Mass at pancreatic head/uncinate 4.0 x 4.1 cm, appears slightly smaller. Mild pancreatic ductal dilatation at body and proximal tail. No additional mass. 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 Adrenal glands  otherwise unremarkable. Small BILATERAL renal cysts. No hydronephrosis, hydroureter, or urinary tract calcification. Bladder decompressed. Stomach/Bowel: Scattered bowel wall thickening, mild in degree, may be related  to ascites. Diverticulosis of descending and sigmoid colon without definite wall thickening to suggest diverticulitis. Appendix surgically absent by history. Prior gastric bypass surgery. Vascular/Lymphatic: Extensive atherosclerotic calcifications aorta and iliac arteries without aneurysm. No definite adenopathy. Reproductive: Atrophic uterus with unremarkable ovaries Other: Significant ascites. No definite peritoneal based mass. Scattered subcutaneous edema. No free air. No hernia. Musculoskeletal: Osseous demineralization. Review of the MIP images confirms the above findings. IMPRESSION: No evidence of pulmonary embolism. BILATERAL pleural effusions with compressive atelectasis of the lower lobes and posterior upper lobes. Slight decrease in size of pancreatic head mass. Marked hepatic steatosis. Slight decrease in size of nodule at the anterior surface LEFT lobe liver. CBD stent without biliary dilatation. Scattered bowel wall thickening, may be related to ascites, unable to exclude enteritis. Distal colonic diverticulosis without evidence of diverticulitis. LEFT adrenal myelolipoma. Aortic Atherosclerosis (ICD10-I70.0). Electronically Signed   By: Lavonia Dana M.D.   On: 05/29/2020 15:00   DG Chest Port 1 View  Result Date: 05/29/2020 CLINICAL DATA:  Evaluate for sepsis. Shortness of breath and cough. History of pancreas cancer. EXAM: PORTABLE CHEST 1 VIEW COMPARISON:  CT chest 11/29/2019 FINDINGS: Right chest wall port a catheter identified with tip at the cavoatrial junction. Normal heart size. A small left pleural effusion is suspected. Additionally, there are patchy airspace densities within the mid and lower left lung zone. Right lung appears clear. IMPRESSION: Left mid and lower lung  zone airspace densities compatible with pneumonia. Suspect small left pleural effusion. Electronically Signed   By: Kerby Moors M.D.   On: 05/29/2020 10:34    Procedures .Critical Care Performed by: Gareth Morgan, MD Authorized by: Gareth Morgan, MD   Critical care provider statement:    Critical care time (minutes):  60   Critical care was time spent personally by me on the following activities:  Discussions with consultants, evaluation of patient's response to treatment, examination of patient, ordering and performing treatments and interventions, ordering and review of laboratory studies, ordering and review of radiographic studies, pulse oximetry, re-evaluation of patient's condition, obtaining history from patient or surrogate and review of old charts   (including critical care time)  Medications Ordered in ED Medications  lactated ringers infusion ( Intravenous New Bag/Given 05/29/20 1245)  lidocaine-EPINEPHrine (XYLOCAINE W/EPI) 2 %-1:200000 (PF) injection 10 mL (10 mLs Infiltration Not Given 05/29/20 2056)  insulin aspart (novoLOG) injection 0-9 Units (0 Units Subcutaneous Not Given 05/29/20 2057)  vancomycin (VANCOCIN) IVPB 1000 mg/200 mL premix (has no administration in time range)  piperacillin-tazobactam (ZOSYN) IVPB 3.375 g (has no administration in time range)  lactated ringers bolus 1,000 mL (0 mLs Intravenous Stopped 05/29/20 1751)    And  lactated ringers bolus 500 mL (0 mLs Intravenous Stopped 05/29/20 1635)    And  lactated ringers bolus 250 mL (0 mLs Intravenous Stopped 05/29/20 1635)  morphine 4 MG/ML injection 4 mg (4 mg Intravenous Given 05/29/20 1130)  ondansetron (ZOFRAN) injection 4 mg (4 mg Intravenous Given 05/29/20 1132)  sodium chloride 0.9 % bolus 1,000 mL (0 mLs Intravenous Stopped 05/29/20 1635)  azithromycin (ZITHROMAX) 500 mg in sodium chloride 0.9 % 250 mL IVPB (0 mg Intravenous Stopped 05/29/20 1555)  iohexol (OMNIPAQUE) 350 MG/ML injection  100 mL (100 mLs Intravenous Contrast Given 05/29/20 1343)  sodium chloride (PF) 0.9 % injection (  Given 05/29/20 1343)  piperacillin-tazobactam (ZOSYN) IVPB 3.375 g (0 g Intravenous Stopped 05/29/20 1635)  vancomycin (VANCOREADY) IVPB 1250 mg/250 mL (0 mg Intravenous Stopped 05/29/20 2057)  dextrose 50 %  solution 50 mL (50 mLs Intravenous Given 05/29/20 1652)    ED Course  I have reviewed the triage vital signs and the nursing notes.  Pertinent labs & imaging results that were available during my care of the patient were reviewed by me and considered in my medical decision making (see chart for details).    MDM Rules/Calculators/A&P                            74 year old female with history of diabetes, hypertension, pancreatic cancer stage III unresectable with palliative goal on chemo, recent diagnosis of urinary tract infection (E. Coli sensitive to cipro on cipro) who presents with concern for abdominal pain and near syncope.  Tachycardic on arrival to the ED.  DDx includes sepsis (secondary to pneumonia/UTI/SBP/other intraabdominal infection), dehydration, PE.   Blood cultures ordered and given rocephin for suspected UTI given culture from 12/7 and 30cc/kg LR and ordered CT PE study (given tachycardia, near syncope, dry cough, cancer hx, difficulty taking deep breath) and CT abdomen given abdominal pain.    CXR returned showing possible pneumonia. Added azithromycin.  Blood pressures decreased to 80s, added additional IV fluids.    Labs significant for leukocytosis, elevated INR, lactic acidosis, hypoalbuminemia, similar elevation of alk phos, slight bilirubin elevation  Son reported hx of liver disease-- further chart review shows biliary obstruction and hepatic steatosis.  CT returned showing no PE-pleural effusions, marked hepatic steatosis, decresae in nodule left lower lobe of liver, ascites, scattered bowel wall thickening unable to exclude eneteritis.    Repeat lactic acid  increased to 6. BP improved to 100s. Abx broadened to vanc/zosyn. DDx for lactic acid elevation includes sepsis, liver dysfunction/cancer, mesenteric ischemia.  Given presentation with abdominal pain and persistent lactic acidosis, will consult surgery.   Discussed with PCCM given lactic acidosis, agree that with blood pressures improved stable for stepdown hospitalist admission.  Signed out to Dr. Regenia Skeeter with continued consult/admission pending.     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

## 2020-05-29 NOTE — Consult Note (Signed)
Tamara Wood [birth year]  [id number].    Requesting MD: Dr. Billy Fischer Chief Complaint/Reason for Consult: abdominal pain with pancreatic cancer  HPI:  This is a 74 yo Hispanic female who was diagnosed with stage III pancreatic cancer in [place] about a year ago.  She underwent a laparotomy for a whipple in March of this year but it was aborted, likely due to metastatic disease, and given a GJ bypass.  She has since moved down here to live with family.  She is currently getting palliative chemotherapy under the care of Dr. Burr Medico.  She is getting Gemcitabine every 2 weeks as well as Retacrit.    Her family who translates for her states she started having abdominal pain about a week ago but was nothing more than usual.  She intermittently has some nausea and vomiting, but this has not increased in frequency.  She occasionally has diarrhea.  She noted some BRB in her stool but admits to have hemorrhoids.  Today she went to the bathroom and had a near syncopal episode and an acute increase in her abdominal pain.  She was brought in to the ED and found to have hypotension, tachycardia, WBC of 26, INR 2.5 with elevated LFTs and a CT scan that reveals bilateral pleural effusions, ascites, and some questionable bowel wall thickening, but in the setting of ascites this is unspecific.  Her lactic acid has increased from 5 to 6.6 despite 2L of fluid.  She is still undergoing resuscitation but slowly per EDP (as far as her fluids).  She also has a questionable PNA and has complained of some SOB and cough.  We have been asked to see her for her septic picture to rule out bowel ischemia.  ROS: ROS: Please see HPI, otherwise all other systems reviewed and are negative except chronic lower extremity edema.  Family History  Problem Relation Age of Onset  . Diabetes Mother   . Diabetes Sister   . Diabetes Brother     Past Medical History:  Diagnosis Date  . Diabetes mellitus without complication ([place])   .  Hypertension   . Pancreatic cancer [facility])     Past Surgical History:  Procedure Laterality Date  . APPENDECTOMY    . BACK SURGERY    . CHOLECYSTECTOMY    . GASTRIC BYPASS OPEN  08/2019   aborted whipple, GJ bypass for obstruction  . IR GASTROSTOMY TUBE REMOVAL  11/30/2019  . IR IMAGING GUIDED PORT INSERTION  12/08/2019    Social History:  reports that she has never smoked. She has never used smokeless tobacco. She reports that she does not drink alcohol and does not use drugs.  Allergies: No Known Allergies  (Not in a hospital admission)    Physical Exam: Blood pressure 105/64, pulse (!) 110, temperature 97.6 F (36.4 C), temperature source Oral, resp. rate 18, height 5\' 4"  (1.626 m), weight 56.2 kg, SpO2 97 %. General: pleasant, chronically ill appearing Hispanic female who is laying in bed in NAD HEENT: head is normocephalic, atraumatic, but covered with a toboggan.  Sclera are noninjected.  PERRL and very small.  Ears and nose without any masses or lesions.  Mouth is pink and dry Heart: tachycardia with some irregularity.  Normal s1,s2. No obvious murmurs, gallops, or rubs noted.  Palpable radial and pedal pulses bilaterally Lungs: Crackles at left base and diminished BS at right base.  Respiratory effort nonlabored on O2. Abd: soft, mild epigastric and right-sided abdominal pain, distention secondary to  ascites, +BS, no masses, hernias, or organomegaly.  Midline scar noted.  No guarding or rebound.  No peritonitis or exam c/w acute abdomen. MS: all 4 extremities are symmetrical with no cyanosis, clubbing.  She does however, have +2 LE edema Skin: warm and dry with no masses, lesions, or rashes Neuro: Cranial nerves 2-12 grossly intact, sensation is normal throughout Psych: A&Ox3 with an appropriate affect.   Results for orders placed or performed during the hospital encounter of 05/29/20 (from the past 48 hour(s))  Lactic acid, plasma     Status: Abnormal   Collection Time:  05/29/20 10:13 AM  Result Value Ref Range   Lactic Acid, Venous 5.5 (HH) 0.5 - 1.9 mmol/L    Comment: CRITICAL RESULT CALLED TO, READ BACK BY AND VERIFIED WITH: DOWD,P. RN AT 1155 05/29/20 MULLINS,T Performed at [facility], [place] [address], [place], [place] [address]   Comprehensive metabolic panel     Status: Abnormal   Collection Time: 05/29/20 10:13 AM  Result Value Ref Range   Sodium 136 135 - 145 mmol/L   Potassium 3.9 3.5 - 5.1 mmol/L   Chloride 106 98 - 111 mmol/L   CO2 19 (L) 22 - 32 mmol/L   Glucose, Bld 59 (L) 70 - 99 mg/dL    Comment: Glucose reference range applies only to samples taken after fasting for at least 8 hours.   BUN 14 8 - 23 mg/dL   Creatinine, Ser 0.89 0.44 - 1.00 mg/dL   Calcium 7.5 (L) 8.9 - 10.3 mg/dL   Total Protein 5.1 (L) 6.5 - 8.1 g/dL   Albumin 1.7 (L) 3.5 - 5.0 g/dL   AST 85 (H) 15 - 41 U/L   ALT 31 0 - 44 U/L   Alkaline Phosphatase 373 (H) 38 - 126 U/L   Total Bilirubin 1.7 (H) 0.3 - 1.2 mg/dL   GFR, Estimated >60 >60 mL/min    Comment: (NOTE) Calculated using the CKD-EPI Creatinine Equation (2021)    Anion gap 11 5 - 15    Comment: Performed at [facility], [place] [address] [place], [place] [address]  CBC WITH DIFFERENTIAL     Status: Abnormal   Collection Time: 05/29/20 10:13 AM  Result Value Ref Range   WBC 26.2 (H) 4.0 - 10.5 K/uL   RBC 2.97 (L) 3.87 - 5.11 MIL/uL   Hemoglobin 10.2 (L) 12.0 - 15.0 g/dL   HCT 29.6 (L) 36.0 - 46.0 %   MCV 99.7 80.0 - 100.0 fL   MCH 34.3 (H) 26.0 - 34.0 pg   MCHC 34.5 30.0 - 36.0 g/dL   RDW 19.5 (H) 11.5 - 15.5 %   Platelets 71 (L) 150 - 400 K/uL    Comment: REPEATED TO VERIFY PLATELET COUNT CONFIRMED BY SMEAR SPECIMEN CHECKED FOR CLOTS Immature Platelet Fraction may be clinically indicated, consider ordering this additional test [id number]    nRBC 3.3 (H) 0.0 - 0.2 %   Neutrophils Relative % 86 %   Neutro Abs 22.5 (H) 1.7 - 7.7 K/uL   Lymphocytes Relative  1 %   Lymphs Abs 0.3 (L) 0.7 - 4.0 K/uL   Monocytes Relative 3 %   Monocytes Absolute 0.7 0.1 - 1.0 K/uL   Eosinophils Relative 0 %   Eosinophils Absolute 0.0 0.0 - 0.5 K/uL   Basophils Relative 0 %   Basophils Absolute 0.0 0.0 - 0.1 K/uL   WBC Morphology TOXIC GRANULATION     Comment: MODERATE LEFT SHIFT (>5%  METAS AND MYELOS,OCC PRO NOTED) INCREASED BANDS (>20% BANDS)    RBC Morphology POLYCHROMASIA PRESENT     Comment: Schistocytes present   Immature Granulocytes 10 %   Abs Immature Granulocytes 2.68 (H) 0.00 - 0.07 K/uL    Comment: Performed at [facility], [place] [address] [place], [place] [address]  Protime-INR     Status: Abnormal   Collection Time: 05/29/20 10:13 AM  Result Value Ref Range   Prothrombin Time 26.4 (H) 11.4 - 15.2 seconds   INR 2.5 (H) 0.8 - 1.2    Comment: (NOTE) INR goal varies based on device and disease states. Performed at [facility], [place] [address], [place], [place] [address]   APTT     Status: Abnormal   Collection Time: 05/29/20 10:13 AM  Result Value Ref Range   aPTT 44 (H) 24 - 36 seconds    Comment:        IF BASELINE aPTT IS ELEVATED, SUGGEST PATIENT RISK ASSESSMENT BE USED TO DETERMINE APPROPRIATE ANTICOAGULANT THERAPY. Performed at [facility], [place] [address], [place], [place] [address]   Resp Panel by RT-PCR (Flu A&B, Covid) Nasopharyngeal Swab     Status: None   Collection Time: 05/29/20 11:17 AM   Specimen: Nasopharyngeal Swab; Nasopharyngeal(NP) swabs in vial transport medium  Result Value Ref Range   SARS Coronavirus 2 by RT PCR NEGATIVE NEGATIVE    Comment: (NOTE) SARS-CoV-2 target nucleic acids are NOT DETECTED.  The SARS-CoV-2 RNA is generally detectable in upper respiratory specimens during the acute phase of infection. The lowest concentration of SARS-CoV-2 viral copies this assay can detect is 138 copies/mL. A negative result does not preclude  SARS-Cov-2 infection and should not be used as the sole basis for treatment or other patient management decisions. A negative result may occur with  improper specimen collection/handling, submission of specimen other than nasopharyngeal swab, presence of viral mutation(s) within the areas targeted by this assay, and inadequate number of viral copies(<138 copies/mL). A negative result must be combined with clinical observations, patient history, and epidemiological information. The expected result is Negative.  Fact Sheet for Patients:  EntrepreneurPulse.com.au  Fact Sheet for Healthcare Providers:  IncredibleEmployment.be  This test is no t yet approved or cleared by the [place] FDA and  has been authorized for detection and/or diagnosis of SARS-CoV-2 by FDA under an Emergency Use Authorization (EUA). This EUA will remain  in effect (meaning this test can be used) for the duration of the COVID-19 declaration under Section 564(b)(1) of the Act, 21 U.S.C.section 360bbb-3(b)(1), unless the authorization is terminated  or revoked sooner.       Influenza A by PCR NEGATIVE NEGATIVE   Influenza B by PCR NEGATIVE NEGATIVE    Comment: (NOTE) The Xpert Xpress SARS-CoV-2/FLU/RSV plus assay is intended as an aid in the diagnosis of influenza from Nasopharyngeal swab specimens and should not be used as a sole basis for treatment. Nasal washings and aspirates are unacceptable for Xpert Xpress SARS-CoV-2/FLU/RSV testing.  Fact Sheet for Patients: EntrepreneurPulse.com.au  Fact Sheet for Healthcare Providers: IncredibleEmployment.be  This test is not yet approved or cleared by the [place] FDA and has been authorized for detection and/or diagnosis of SARS-CoV-2 by FDA under an Emergency Use Authorization (EUA). This EUA will remain in effect (meaning this test can be used) for the duration of the COVID-19  declaration under Section 564(b)(1) of the Act, 21 U.S.C. section 360bbb-3(b)(1), unless the authorization is terminated or revoked.  Performed at [facility], [place] [address], [place], [place] [address]   Lactic acid, plasma     Status: Abnormal   Collection Time: 05/29/20 12:13 PM  Result Value Ref Range   Lactic Acid, Venous 6.6 (HH) 0.5 - 1.9 mmol/L    Comment: CRITICAL VALUE NOTED.  VALUE IS CONSISTENT WITH PREVIOUSLY REPORTED AND CALLED VALUE. Performed at [facility], [place] [address] [place], [place] [address]   Urinalysis, Routine w reflex microscopic Urine, Catheterized     Status: Abnormal   Collection Time: 05/29/20 12:48 PM  Result Value Ref Range   Color, Urine AMBER (A) YELLOW    Comment: BIOCHEMICALS MAY BE AFFECTED BY COLOR   APPearance HAZY (A) CLEAR   Specific Gravity, Urine 1.018 1.005 - 1.030   pH 5.0 5.0 - 8.0   Glucose, UA NEGATIVE NEGATIVE mg/dL   Hgb urine dipstick NEGATIVE NEGATIVE   Bilirubin Urine NEGATIVE NEGATIVE   Ketones, ur NEGATIVE NEGATIVE mg/dL   Protein, ur NEGATIVE NEGATIVE mg/dL   Nitrite NEGATIVE NEGATIVE   Leukocytes,Ua NEGATIVE NEGATIVE    Comment: Performed at [place] [address] [place], [place] [address]   CT Angio Chest PE W and/or Wo Contrast  Result Date: 05/29/2020 CLINICAL DATA:  Syncopal episode, acute abdominal pain, weakness, history of pancreatic cancer on chemotherapy EXAM: CT ANGIOGRAPHY CHEST CT ABDOMEN AND PELVIS WITH CONTRAST TECHNIQUE: Multidetector CT imaging of the chest was performed using the standard protocol during bolus administration of intravenous contrast. Multiplanar CT image reconstructions and MIPs were obtained to evaluate the vascular anatomy. Multidetector CT imaging of the abdomen and pelvis was performed using the standard protocol during bolus administration of intravenous contrast. CONTRAST:  165mL OMNIPAQUE IOHEXOL 350 MG/ML SOLN IV. No  oral contrast. COMPARISON:  CT abdomen and pelvis 03/06/2020, CT chest 11/29/2019 FINDINGS: CTA CHEST FINDINGS Cardiovascular: Atherosclerotic calcifications aorta and coronary arteries. RIGHT jugular Port-A-Cath with tip in RIGHT atrium. Aorta normal caliber without aneurysm or dissection. Heart normal size. No pericardial effusion. Pulmonary arteries adequately opacified and grossly patent. No evidence of pulmonary embolism. Mediastinum/Nodes: Soft is unremarkable. Base of cervical region normal appearance. No thoracic adenopathy. Lungs/Pleura: Moderate pleural effusions bilaterally. Compressive atelectasis of BILATERAL lower lobes and posterior upper lobes minimal infiltrate in upper lobes. No pneumothorax. No definite pulmonary mass/nodule. Musculoskeletal: Diffuse osseous demineralization. No discrete bone lesion. Review of the MIP images confirms the above findings. CT ABDOMEN and PELVIS FINDINGS Hepatobiliary: Marked hepatic steatosis. Gallbladder surgically absent. CBD stent. No hepatic mass or biliary dilatation. Small nodule anterior to the LEFT lobe of the liver on the previous exam is poorly demonstrated on the current study, subjectively smaller but difficult to accurately measure due to ascites. Pancreas: Mass at pancreatic head/uncinate 4.0 x 4.1 cm, appears slightly smaller. Mild pancreatic ductal dilatation at body and proximal tail. No additional mass. 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 Adrenal glands otherwise unremarkable. Small BILATERAL renal cysts. No hydronephrosis, hydroureter, or urinary tract calcification. Bladder decompressed. Stomach/Bowel: Scattered bowel wall thickening, mild in degree, may be related to ascites. Diverticulosis of descending and sigmoid colon without definite wall thickening to suggest diverticulitis. Appendix surgically absent by history. Prior gastric bypass surgery.  Vascular/Lymphatic: Extensive atherosclerotic calcifications aorta and iliac arteries without aneurysm. No definite adenopathy. Reproductive: Atrophic uterus with unremarkable ovaries Other: Significant ascites. No definite peritoneal based mass. Scattered subcutaneous edema. No free air. No hernia. Musculoskeletal: Osseous  demineralization. Review of the MIP images confirms the above findings. IMPRESSION: No evidence of pulmonary embolism. BILATERAL pleural effusions with compressive atelectasis of the lower lobes and posterior upper lobes. Slight decrease in size of pancreatic head mass. Marked hepatic steatosis. Slight decrease in size of nodule at the anterior surface LEFT lobe liver. CBD stent without biliary dilatation. Scattered bowel wall thickening, may be related to ascites, unable to exclude enteritis. Distal colonic diverticulosis without evidence of diverticulitis. LEFT adrenal myelolipoma. Aortic Atherosclerosis (ICD10-I70.0). Electronically Signed   By: Lavonia Dana M.D.   On: 05/29/2020 15:00   CT ABDOMEN PELVIS W CONTRAST  Result Date: 05/29/2020 CLINICAL DATA:  Syncopal episode, acute abdominal pain, weakness, history of pancreatic cancer on chemotherapy EXAM: CT ANGIOGRAPHY CHEST CT ABDOMEN AND PELVIS WITH CONTRAST TECHNIQUE: Multidetector CT imaging of the chest was performed using the standard protocol during bolus administration of intravenous contrast. Multiplanar CT image reconstructions and MIPs were obtained to evaluate the vascular anatomy. Multidetector CT imaging of the abdomen and pelvis was performed using the standard protocol during bolus administration of intravenous contrast. CONTRAST:  110mL OMNIPAQUE IOHEXOL 350 MG/ML SOLN IV. No oral contrast. COMPARISON:  CT abdomen and pelvis 03/06/2020, CT chest 11/29/2019 FINDINGS: CTA CHEST FINDINGS Cardiovascular: Atherosclerotic calcifications aorta and coronary arteries. RIGHT jugular Port-A-Cath with tip in RIGHT atrium. Aorta  normal caliber without aneurysm or dissection. Heart normal size. No pericardial effusion. Pulmonary arteries adequately opacified and grossly patent. No evidence of pulmonary embolism. Mediastinum/Nodes: Soft is unremarkable. Base of cervical region normal appearance. No thoracic adenopathy. Lungs/Pleura: Moderate pleural effusions bilaterally. Compressive atelectasis of BILATERAL lower lobes and posterior upper lobes minimal infiltrate in upper lobes. No pneumothorax. No definite pulmonary mass/nodule. Musculoskeletal: Diffuse osseous demineralization. No discrete bone lesion. Review of the MIP images confirms the above findings. CT ABDOMEN and PELVIS FINDINGS Hepatobiliary: Marked hepatic steatosis. Gallbladder surgically absent. CBD stent. No hepatic mass or biliary dilatation. Small nodule anterior to the LEFT lobe of the liver on the previous exam is poorly demonstrated on the current study, subjectively smaller but difficult to accurately measure due to ascites. Pancreas: Mass at pancreatic head/uncinate 4.0 x 4.1 cm, appears slightly smaller. Mild pancreatic ductal dilatation at body and proximal tail. No additional mass. 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 Adrenal glands otherwise unremarkable. Small BILATERAL renal cysts. No hydronephrosis, hydroureter, or urinary tract calcification. Bladder decompressed. Stomach/Bowel: Scattered bowel wall thickening, mild in degree, may be related to ascites. Diverticulosis of descending and sigmoid colon without definite wall thickening to suggest diverticulitis. Appendix surgically absent by history. Prior gastric bypass surgery. Vascular/Lymphatic: Extensive atherosclerotic calcifications aorta and iliac arteries without aneurysm. No definite adenopathy. Reproductive: Atrophic uterus with unremarkable ovaries Other: Significant ascites. No definite peritoneal based mass.  Scattered subcutaneous edema. No free air. No hernia. Musculoskeletal: Osseous demineralization. Review of the MIP images confirms the above findings. IMPRESSION: No evidence of pulmonary embolism. BILATERAL pleural effusions with compressive atelectasis of the lower lobes and posterior upper lobes. Slight decrease in size of pancreatic head mass. Marked hepatic steatosis. Slight decrease in size of nodule at the anterior surface LEFT lobe liver. CBD stent without biliary dilatation. Scattered bowel wall thickening, may be related to ascites, unable to exclude enteritis. Distal colonic diverticulosis without evidence of diverticulitis. LEFT adrenal myelolipoma. Aortic Atherosclerosis (ICD10-I70.0). Electronically Signed   By: Lavonia Dana M.D.   On: 05/29/2020 15:00   [facility] 1 View  Result  Date: 05/29/2020 CLINICAL DATA:  Evaluate for sepsis. Shortness of breath and cough. History of pancreas cancer. EXAM: PORTABLE CHEST 1 VIEW COMPARISON:  CT chest 11/29/2019 FINDINGS: Right chest wall port a catheter identified with tip at the cavoatrial junction. Normal heart size. A small left pleural effusion is suspected. Additionally, there are patchy airspace densities within the mid and lower left lung zone. Right lung appears clear. IMPRESSION: Left mid and lower lung zone airspace densities compatible with pneumonia. Suspect small left pleural effusion. Electronically Signed   By: Kerby Moors M.D.   On: 05/29/2020 10:34      Assessment/Plan HTN DM Hypoalbuminemia Thrombocytopenia Hypoglycemia Elevated LFTs supratherapeutic INR - not on anticoagulants Stage III pancreatic cancer - check CA 19-9.  Recommend oncology evaluation as well as palliative evaluation given overall prognosis for patient is fairly poor Jehovah's Witness - does not accept blood products Sepsis - Leukocytosis, elevated lactic acid, ? PNA B pleural effusions - could consider thoracenteses to help with SOB and cough.  Could  also send fluid for labs - defer to medicine Ascites - wound recommend and consider paracentesis to evaluate fluid for infection.  No air noted in fluid or peritoneal thickening c/w SBP, but this may also help her feel better as well - defer to medicine  Abdominal pain The patient's chart, imaging, labs, etc have all been reviewed and discussed with the EDP as well as Dr. Barry Dienes.  The patient has pain mostly in her epigastrium and her RUQ.  This pain is somewhat minimal on my exam.  She has received some pain medication early today though.  Her labs are certainly concerning, but I do not think based on her clinical exam that she has bowel ischemia.  Hopefully as she continues to get resuscitated that her lactic acid will fall and begin to normalize.  No acute needs for surgical indication at this time.  Agree with abx therapy.  Should remain NPO.  As above, could consider paracentesis to send fluid for labs and culture, although no overt evidence on CT scan for infected ascites.  Would recommend oncology and palliative care consults to help with goals of care.  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  She also has concerning constellation of symptoms such as elevated LFTs, hypoglycemia, hypoalbuminemia, etc that are concerning for liver failure.  This could be etiology of some of these labs.  For now, no surgical intervention, but we will follow closely with you.    FEN - NPO/IVFs VTE - rec holding due to elevated INR and low plts ID - per medicine  Henreitta Cea, [facility] 05/29/2020, 4:17 PM Please see Amion for pager number during day hours 7:00am-4:30pm or 7:00am -11:30am on weekends

## 2020-05-29 NOTE — Progress Notes (Signed)
A consult was received from an ED physician for vancomycin per pharmacy dosing.  The patient's profile has been reviewed for ht/wt/allergies/indication/available labs.   A one time order has been placed for vancomycin 1250 mg IV x 1 dose.    Further antibiotics/pharmacy consults should be ordered by admitting physician if indicated.                       Thank you,  Eudelia Bunch, Pharm.D 05/29/2020 3:58 PM

## 2020-05-29 NOTE — Progress Notes (Signed)
Tamara Wood   DOB:[birth year]   HW#:[id number]   VEL#:[id number]  Oncology follow up   Subjective: Patient is well-known to me, under my care for her pancreatic cancer, currently on low-dose chemotherapy.  He presented with worsening abdominal pain and bloating, profound weakness, dizziness and fainting at home.  No fever or chills.  Her last chemo was on 12/2.   Objective:  Vitals:   05/29/20 2230 05/29/20 2240  BP:  109/64  Pulse: 97 99  Resp:  (!) 22  Temp:    SpO2: 100% 99%    Body mass index is 21.28 kg/m. No intake or output data in the 24 hours ending 05/29/20 2333   Sclerae unicteric  Oropharynx clear  No peripheral adenopathy  Lungs clear -- no rales or rhonchi  Heart regular rate and rhythm  Abdomen soft, distended, with diffuse mild tenderness, and (+) ascites   MSK no focal spinal tenderness, no peripheral edema  Neuro nonfocal    CBG (last 3)  Recent Labs    05/29/20 1750 05/29/20 2030 05/29/20 2206  GLUCAP 97 95 82     Labs:  Urine Studies No results for input(s): UHGB, CRYS in the last 72 hours.  Invalid input(s): UACOL, UAPR, USPG, UPH, UTP, UGL, UKET, UBIL, UNIT, UROB, ULEU, UEPI, UWBC, URBC, UBAC, CAST, UCOM, BILUA  Basic Metabolic Panel: Recent Labs  Lab 05/29/20 1013  NA 136  K 3.9  CL 106  CO2 19*  GLUCOSE 59*  BUN 14  CREATININE 0.89  CALCIUM 7.5*   GFR Estimated Creatinine Clearance: 47.2 mL/min (by C-G formula based on SCr of 0.89 mg/dL). Liver Function Tests: Recent Labs  Lab 05/29/20 1013  AST 85*  ALT 31  ALKPHOS 373*  BILITOT 1.7*  PROT 5.1*  ALBUMIN 1.7*   No results for input(s): LIPASE, AMYLASE in the last 168 hours. Recent Labs  Lab 05/29/20 2030  AMMONIA 36*   Coagulation profile Recent Labs  Lab 05/29/20 1013  INR 2.5*    CBC: Recent Labs  Lab 05/29/20 1013  WBC 26.2*  NEUTROABS 22.5*  HGB 10.2*  HCT 29.6*  MCV 99.7  PLT 71*   Cardiac Enzymes: No results for input(s): CKTOTAL, CKMB,  CKMBINDEX, TROPONINI in the last 168 hours. BNP: Invalid input(s): POCBNP CBG: Recent Labs  Lab 05/29/20 1629 05/29/20 1750 05/29/20 2030 05/29/20 2206  GLUCAP 36* 97 95 82   D-Dimer No results for input(s): DDIMER in the last 72 hours. Hgb A1c No results for input(s): HGBA1C in the last 72 hours. Lipid Profile No results for input(s): CHOL, HDL, LDLCALC, TRIG, CHOLHDL, LDLDIRECT in the last 72 hours. Thyroid function studies No results for input(s): TSH, T4TOTAL, T3FREE, THYROIDAB in the last 72 hours.  Invalid input(s): FREET3 Anemia work up No results for input(s): VITAMINB12, FOLATE, FERRITIN, TIBC, IRON, RETICCTPCT in the last 72 hours. Microbiology Recent Results (from the past 240 hour(s))  Resp Panel by RT-PCR (Flu A&B, Covid) Nasopharyngeal Swab     Status: None   Collection Time: 05/29/20 11:17 AM   Specimen: Nasopharyngeal Swab; Nasopharyngeal(NP) swabs in vial transport medium  Result Value Ref Range Status   SARS Coronavirus 2 by RT PCR NEGATIVE NEGATIVE Final    Comment: (NOTE) SARS-CoV-2 target nucleic acids are NOT DETECTED.  The SARS-CoV-2 RNA is generally detectable in upper respiratory specimens during the acute phase of infection. The lowest concentration of SARS-CoV-2 viral copies this assay can detect is 138 copies/mL. A negative result does not preclude SARS-Cov-2 infection and  should not be used as the sole basis for treatment or other patient management decisions. A negative result may occur with  improper specimen collection/handling, submission of specimen other than nasopharyngeal swab, presence of viral mutation(s) within the areas targeted by this assay, and inadequate number of viral copies(<138 copies/mL). A negative result must be combined with clinical observations, patient history, and epidemiological information. The expected result is Negative.  Fact Sheet for Patients:  EntrepreneurPulse.com.au  Fact Sheet for  Healthcare Providers:  IncredibleEmployment.be  This test is no t yet approved or cleared by the [place] FDA and  has been authorized for detection and/or diagnosis of SARS-CoV-2 by FDA under an Emergency Use Authorization (EUA). This EUA will remain  in effect (meaning this test can be used) for the duration of the COVID-19 declaration under Section 564(b)(1) of the Act, 21 U.S.C.section 360bbb-3(b)(1), unless the authorization is terminated  or revoked sooner.       Influenza A by PCR NEGATIVE NEGATIVE Final   Influenza B by PCR NEGATIVE NEGATIVE Final    Comment: (NOTE) The Xpert Xpress SARS-CoV-2/FLU/RSV plus assay is intended as an aid in the diagnosis of influenza from Nasopharyngeal swab specimens and should not be used as a sole basis for treatment. Nasal washings and aspirates are unacceptable for Xpert Xpress SARS-CoV-2/FLU/RSV testing.  Fact Sheet for Patients: EntrepreneurPulse.com.au  Fact Sheet for Healthcare Providers: IncredibleEmployment.be  This test is not yet approved or cleared by the [place] FDA and has been authorized for detection and/or diagnosis of SARS-CoV-2 by FDA under an Emergency Use Authorization (EUA). This EUA will remain in effect (meaning this test can be used) for the duration of the COVID-19 declaration under Section 564(b)(1) of the Act, 21 U.S.C. section 360bbb-3(b)(1), unless the authorization is terminated or revoked.  Performed at [facility], [place] [address], [place], [place] [address]   Body fluid culture (includes gram stain)     Status: None (Preliminary result)   Collection Time: 05/29/20  4:34 PM   Specimen: Peritoneal Washings; Peritoneal Fluid  Result Value Ref Range Status   Specimen Description   Final    PERITONEAL FLUID Performed at [facility], [place] [address] [place], [place] [address]    Special Requests   Final     NONE Performed at [facility], [place] [address] [place], [place] [address]    Gram Stain PENDING  Incomplete   Culture PENDING  Incomplete   Report Status PENDING  Incomplete      Studies:  CT Angio Chest PE W and/or Wo Contrast  Result Date: 05/29/2020 CLINICAL DATA:  Syncopal episode, acute abdominal pain, weakness, history of pancreatic cancer on chemotherapy EXAM: CT ANGIOGRAPHY CHEST CT ABDOMEN AND PELVIS WITH CONTRAST TECHNIQUE: Multidetector CT imaging of the chest was performed using the standard protocol during bolus administration of intravenous contrast. Multiplanar CT image reconstructions and MIPs were obtained to evaluate the vascular anatomy. Multidetector CT imaging of the abdomen and pelvis was performed using the standard protocol during bolus administration of intravenous contrast. CONTRAST:  12mL OMNIPAQUE IOHEXOL 350 MG/ML SOLN IV. No oral contrast. COMPARISON:  CT abdomen and pelvis 03/06/2020, CT chest 11/29/2019 FINDINGS: CTA CHEST FINDINGS Cardiovascular: Atherosclerotic calcifications aorta and coronary arteries. RIGHT jugular Port-A-Cath with tip in RIGHT atrium. Aorta normal caliber without aneurysm or dissection. Heart normal size. No pericardial effusion. Pulmonary arteries adequately opacified and grossly patent. No evidence of pulmonary embolism. Mediastinum/Nodes: Soft is unremarkable. Base of cervical region  normal appearance. No thoracic adenopathy. Lungs/Pleura: Moderate pleural effusions bilaterally. Compressive atelectasis of BILATERAL lower lobes and posterior upper lobes minimal infiltrate in upper lobes. No pneumothorax. No definite pulmonary mass/nodule. Musculoskeletal: Diffuse osseous demineralization. No discrete bone lesion. Review of the MIP images confirms the above findings. CT ABDOMEN and PELVIS FINDINGS Hepatobiliary: Marked hepatic steatosis. Gallbladder surgically absent. CBD stent. No hepatic mass or biliary dilatation. Small  nodule anterior to the LEFT lobe of the liver on the previous exam is poorly demonstrated on the current study, subjectively smaller but difficult to accurately measure due to ascites. Pancreas: Mass at pancreatic head/uncinate 4.0 x 4.1 cm, appears slightly smaller. Mild pancreatic ductal dilatation at body and proximal tail. No additional mass. 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 Adrenal glands otherwise unremarkable. Small BILATERAL renal cysts. No hydronephrosis, hydroureter, or urinary tract calcification. Bladder decompressed. Stomach/Bowel: Scattered bowel wall thickening, mild in degree, may be related to ascites. Diverticulosis of descending and sigmoid colon without definite wall thickening to suggest diverticulitis. Appendix surgically absent by history. Prior gastric bypass surgery. Vascular/Lymphatic: Extensive atherosclerotic calcifications aorta and iliac arteries without aneurysm. No definite adenopathy. Reproductive: Atrophic uterus with unremarkable ovaries Other: Significant ascites. No definite peritoneal based mass. Scattered subcutaneous edema. No free air. No hernia. Musculoskeletal: Osseous demineralization. Review of the MIP images confirms the above findings. IMPRESSION: No evidence of pulmonary embolism. BILATERAL pleural effusions with compressive atelectasis of the lower lobes and posterior upper lobes. Slight decrease in size of pancreatic head mass. Marked hepatic steatosis. Slight decrease in size of nodule at the anterior surface LEFT lobe liver. CBD stent without biliary dilatation. Scattered bowel wall thickening, may be related to ascites, unable to exclude enteritis. Distal colonic diverticulosis without evidence of diverticulitis. LEFT adrenal myelolipoma. Aortic Atherosclerosis (ICD10-I70.0). Electronically Signed   By: Lavonia Dana M.D.   On: 05/29/2020 15:00   CT ABDOMEN PELVIS W  CONTRAST  Result Date: 05/29/2020 CLINICAL DATA:  Syncopal episode, acute abdominal pain, weakness, history of pancreatic cancer on chemotherapy EXAM: CT ANGIOGRAPHY CHEST CT ABDOMEN AND PELVIS WITH CONTRAST TECHNIQUE: Multidetector CT imaging of the chest was performed using the standard protocol during bolus administration of intravenous contrast. Multiplanar CT image reconstructions and MIPs were obtained to evaluate the vascular anatomy. Multidetector CT imaging of the abdomen and pelvis was performed using the standard protocol during bolus administration of intravenous contrast. CONTRAST:  111mL OMNIPAQUE IOHEXOL 350 MG/ML SOLN IV. No oral contrast. COMPARISON:  CT abdomen and pelvis 03/06/2020, CT chest 11/29/2019 FINDINGS: CTA CHEST FINDINGS Cardiovascular: Atherosclerotic calcifications aorta and coronary arteries. RIGHT jugular Port-A-Cath with tip in RIGHT atrium. Aorta normal caliber without aneurysm or dissection. Heart normal size. No pericardial effusion. Pulmonary arteries adequately opacified and grossly patent. No evidence of pulmonary embolism. Mediastinum/Nodes: Soft is unremarkable. Base of cervical region normal appearance. No thoracic adenopathy. Lungs/Pleura: Moderate pleural effusions bilaterally. Compressive atelectasis of BILATERAL lower lobes and posterior upper lobes minimal infiltrate in upper lobes. No pneumothorax. No definite pulmonary mass/nodule. Musculoskeletal: Diffuse osseous demineralization. No discrete bone lesion. Review of the MIP images confirms the above findings. CT ABDOMEN and PELVIS FINDINGS Hepatobiliary: Marked hepatic steatosis. Gallbladder surgically absent. CBD stent. No hepatic mass or biliary dilatation. Small nodule anterior to the LEFT lobe of the liver on the previous exam is poorly demonstrated on the current study, subjectively smaller but difficult to accurately measure due to ascites. Pancreas: Mass at pancreatic head/uncinate 4.0 x 4.1 cm,  appears  slightly smaller. Mild pancreatic ductal dilatation at body and proximal tail. No additional mass. 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 Adrenal glands otherwise unremarkable. Small BILATERAL renal cysts. No hydronephrosis, hydroureter, or urinary tract calcification. Bladder decompressed. Stomach/Bowel: Scattered bowel wall thickening, mild in degree, may be related to ascites. Diverticulosis of descending and sigmoid colon without definite wall thickening to suggest diverticulitis. Appendix surgically absent by history. Prior gastric bypass surgery. Vascular/Lymphatic: Extensive atherosclerotic calcifications aorta and iliac arteries without aneurysm. No definite adenopathy. Reproductive: Atrophic uterus with unremarkable ovaries Other: Significant ascites. No definite peritoneal based mass. Scattered subcutaneous edema. No free air. No hernia. Musculoskeletal: Osseous demineralization. Review of the MIP images confirms the above findings. IMPRESSION: No evidence of pulmonary embolism. BILATERAL pleural effusions with compressive atelectasis of the lower lobes and posterior upper lobes. Slight decrease in size of pancreatic head mass. Marked hepatic steatosis. Slight decrease in size of nodule at the anterior surface LEFT lobe liver. CBD stent without biliary dilatation. Scattered bowel wall thickening, may be related to ascites, unable to exclude enteritis. Distal colonic diverticulosis without evidence of diverticulitis. LEFT adrenal myelolipoma. Aortic Atherosclerosis (ICD10-I70.0). Electronically Signed   By: Lavonia Dana M.D.   On: 05/29/2020 15:00   DG Chest Port 1 View  Result Date: 05/29/2020 CLINICAL DATA:  Evaluate for sepsis. Shortness of breath and cough. History of pancreas cancer. EXAM: PORTABLE CHEST 1 VIEW COMPARISON:  CT chest 11/29/2019 FINDINGS: Right chest wall port a catheter identified with tip at the  cavoatrial junction. Normal heart size. A small left pleural effusion is suspected. Additionally, there are patchy airspace densities within the mid and lower left lung zone. Right lung appears clear. IMPRESSION: Left mid and lower lung zone airspace densities compatible with pneumonia. Suspect small left pleural effusion. Electronically Signed   By: Kerby Moors M.D.   On: 05/29/2020 10:34    Assessment: 74 y.o. female with unresectable pancreatic cancer, currently on chemotherapy gemcitabine and Abraxane every 2 weeks, presented with abdominal pain, bloating, and profound weakness.  1. Weakness and lactic acidosis, concerning for sepsis 2.  Unresectable pancreatic cancer, currently on palliative chemotherapy with gemcitabine and Abraxane, last dose on 12/2 3.  New onset ascites and bilateral pleural effusion, no extremity edema, anasarca, ruled out malignant ascites  4.  Transaminitis and mild hyperbilirubinemia 5.  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  Leukocytosis, secondary to G-CSF, and possible infection 7.  Severe protein and calorie malnutrition  Plan:  -She underwent paracentesis in the ED, I do not see cytology was ordered, will call lab in the morning to add on if possible -Agree with antibiotics -I reviewed her CT scan from today, which showed no PE, primary pancreatic cancer is slightly smaller, no definitive new metastasis on scan  -Given the diffuse anasarca, bilateral pleural effusion, ascites, bilateral lower extremity edema, I think her anasarca is likely related to her malnutrition, low albumin.  -continue supportive care  -I will f/u her on Wednesday    Truitt Merle, MD 05/29/2020

## 2020-05-29 NOTE — H&P (Addendum)
Tamara Wood XMI:[id number] DOB: [birth year] DOA: 05/29/2020   PCP: Isaac Bliss, Rayford Halsted, MD   Outpatient Specialists:     Oncology   Dr. Burr Medico   Patient arrived to ER on 05/29/20 at 0846 Referred by Attending Sherwood Gambler, MD   Patient coming from: home Lives  With family    Chief Complaint:   Chief Complaint  Patient presents with  . Abdominal Pain  . Weakness    HPI: Tamara Wood is a 74 y.o. female with medical history significant of  Pancreatic cancer, DM2, HTN, Jehovah's Witness - does not accept blood products    Presented with increased fatigue and abdominal pain Pain lasted about a week ago associated some nausea and vomiting occasional diarrhea she has hemorrhoids and occasionally have had some bright red blood.  When she went to the bathroom today she had a severe increase in abdominal pain which almost made a pass out That caused her family to bring to emergency department Where she was found to be severely hypotensive and tachycardic.  Originally diagnosed with pancreatic cancer in [place] about a year ago has undergone laparotomy and Whipple procedure in March 2021 but could not complete secondary to metastatic disease and instead was given GJ bypass. Patient moved to [facility] Currently getting palliative chemotherapy (Gemcitabine every 2 weeks and  Retacrit.)  Recently diagnosed with UTI and started on Septra  Reports some cough  Infectious risk factors:  Reports   shortness of breath,   chest pain, S N/V/Diarrhea/abdominal pain,    severe fatigue   Has been vaccinated against COVID and boosted   Initial COVID TEST  NEGATIVE   Lab Results  Component Value Date   [place] NEGATIVE 05/29/2020     Regarding pertinent Chronic problems:       HTN on Norvasc metoprolol Lasix      DM 2 -  Lab Results  Component Value Date   HGBA1C 6.1 (A) 03/31/2020   on   PO meds only,     Chronic anemia - baseline hg Hemoglobin & Hematocrit   Recent Labs    05/04/20 1133 05/18/20 1112 05/29/20 1013  HGB 9.6* 8.7* 10.2*    While in ER: On arrival white blood cell count 26  Elevated lactic acid above 5 went up to 6.6 despite 2 L of IV fluids Elevated LFTs  INR 2.5 CT abdomen showed bilateral pleural effusions ascites Chest x-ray worrisome for pneumonia  Diagnostic LP done by emergency provider  ER Provider Called:   General surgery PA Saverio Danker Felt no surgical indication at this time lactic acid elevation unlikely to be due to bowel ischemia Recommend oncology and palliative care consult They Recommend admit to medicine   We will continue to follow with this  Hospitalist was called for admission for sepsis  The following Work up has been ordered so far:  Orders Placed This Encounter  Procedures  . PARACENTESIS  . Blood Culture (routine x 2)  . Urine culture  . Resp Panel by RT-PCR (Flu A&B, Covid) Nasopharyngeal Swab  . Body fluid culture (includes gram stain)  . DG Chest Port 1 View  . CT Angio Chest PE W and/or Wo Contrast  . CT ABDOMEN PELVIS W CONTRAST  . Lactic acid, plasma  . Comprehensive metabolic panel  . CBC WITH DIFFERENTIAL  . Protime-INR  . APTT  . Urinalysis, Routine w reflex microscopic  . Cancer antigen 19-9  . Body fluid cell count with differential  .  Lactic acid, plasma  . Diet NPO time specified  . Cardiac monitoring  . Document height and weight  . Assess and Document Glasgow Coma Scale  . Document vital signs within 1-hour of fluid bolus completion. Notify provider of abnormal vital signs despite fluid resuscitation.  . DO NOT delay antibiotics if unable to obtain blood culture.  . Refer to Sidebar Report: Sepsis Sidebar ED/IP  . Notify provider for difficulties obtaining IV access.  . Insert peripheral IV x 2  . Initiate Carrier Fluid Protocol  . Informed Consent Details: Physician/Practitioner Attestation; Transcribe to consent form and obtain patient signature  .  Paracentesis tray to bedside  . Consult to intensivist  ALL PATIENTS BEING ADMITTED/HAVING PROCEDURES NEED COVID-19 SCREENING  . Consult to hospitalist  ALL PATIENTS BEING ADMITTED/HAVING PROCEDURES NEED COVID-19 SCREENING  . Pulse oximetry, continuous  . CBG monitoring, ED  . CBG monitoring, ED  . CBG monitoring, ED  . ED EKG 12-Lead    Following Medications were ordered in ER: Medications  lactated ringers infusion ( Intravenous New Bag/Given 05/29/20 1245)  cefTRIAXone (ROCEPHIN) 1 g in sodium chloride 0.9 % 100 mL IVPB (0 g Intravenous Stopped 05/29/20 1409)  lidocaine-EPINEPHrine (XYLOCAINE W/EPI) 2 %-1:200000 (PF) injection 10 mL (10 mLs Infiltration Handoff 05/29/20 1713)  lactated ringers bolus 1,000 mL (0 mLs Intravenous Stopped 05/29/20 1751)    And  lactated ringers bolus 500 mL (0 mLs Intravenous Stopped 05/29/20 1635)    And  lactated ringers bolus 250 mL (0 mLs Intravenous Stopped 05/29/20 1635)  morphine 4 MG/ML injection 4 mg (4 mg Intravenous Given 05/29/20 1130)  ondansetron (ZOFRAN) injection 4 mg (4 mg Intravenous Given 05/29/20 1132)  sodium chloride 0.9 % bolus 1,000 mL (0 mLs Intravenous Stopped 05/29/20 1635)  azithromycin (ZITHROMAX) 500 mg in sodium chloride 0.9 % 250 mL IVPB (0 mg Intravenous Stopped 05/29/20 1555)  iohexol (OMNIPAQUE) 350 MG/ML injection 100 mL (100 mLs Intravenous Contrast Given 05/29/20 1343)  sodium chloride (PF) 0.9 % injection (  Given 05/29/20 1343)  piperacillin-tazobactam (ZOSYN) IVPB 3.375 g (0 g Intravenous Stopped 05/29/20 1635)  vancomycin (VANCOREADY) IVPB 1250 mg/250 mL (1,250 mg Intravenous New Bag/Given 05/29/20 1634)  dextrose 50 % solution 50 mL (50 mLs Intravenous Given 05/29/20 1652)        Consult Orders  (From admission, onward)         Start     Ordered   05/29/20 1745  Consult to hospitalist  ALL PATIENTS BEING ADMITTED/HAVING PROCEDURES NEED COVID-19 SCREENING  Once       Comments: ALL PATIENTS BEING  ADMITTED/HAVING PROCEDURES NEED COVID-19 SCREENING  Provider:  (Not yet assigned)  Question Answer Comment  Place call to: [facility]   Reason for Consult Admit      05/29/20 1744          Significant initial  Findings: Abnormal Labs Reviewed  LACTIC ACID, PLASMA - Abnormal; Notable for the following components:      Result Value   Lactic Acid, Venous 5.5 (*)    All other components within normal limits  LACTIC ACID, PLASMA - Abnormal; Notable for the following components:   Lactic Acid, Venous 6.6 (*)    All other components within normal limits  COMPREHENSIVE METABOLIC PANEL - Abnormal; Notable for the following components:   CO2 19 (*)    Glucose, Bld 59 (*)    Calcium 7.5 (*)    Total Protein 5.1 (*)    Albumin 1.7 (*)  AST 85 (*)    Alkaline Phosphatase 373 (*)    Total Bilirubin 1.7 (*)    All other components within normal limits  CBC WITH DIFFERENTIAL/PLATELET - Abnormal; Notable for the following components:   WBC 26.2 (*)    RBC 2.97 (*)    Hemoglobin 10.2 (*)    HCT 29.6 (*)    MCH 34.3 (*)    RDW 19.5 (*)    Platelets 71 (*)    nRBC 3.3 (*)    Neutro Abs 22.5 (*)    Lymphs Abs 0.3 (*)    Abs Immature Granulocytes 2.68 (*)    All other components within normal limits  PROTIME-INR - Abnormal; Notable for the following components:   Prothrombin Time 26.4 (*)    INR 2.5 (*)    All other components within normal limits  APTT - Abnormal; Notable for the following components:   aPTT 44 (*)    All other components within normal limits  URINALYSIS, ROUTINE W REFLEX MICROSCOPIC - Abnormal; Notable for the following components:   Color, Urine AMBER (*)    APPearance HAZY (*)    All other components within normal limits  BODY FLUID CELL COUNT WITH DIFFERENTIAL - Abnormal; Notable for the following components:   Color, Fluid STRAW (*)    Appearance, Fluid TURBID (*)    Total Nucleated Cell Count, Fluid 9,450 (*)    Neutrophil Count, Fluid 89 (*)     Monocyte-Macrophage-Serous Fluid 8 (*)    All other components within normal limits  LACTIC ACID, PLASMA - Abnormal; Notable for the following components:   Lactic Acid, Venous 5.9 (*)    All other components within normal limits  LACTIC ACID, PLASMA - Abnormal; Notable for the following components:   Lactic Acid, Venous 4.9 (*)    All other components within normal limits  LACTIC ACID, PLASMA - Abnormal; Notable for the following components:   Lactic Acid, Venous 5.0 (*)    All other components within normal limits  BLOOD GAS, VENOUS - Abnormal; Notable for the following components:   pCO2, Ven 40.5 (*)    pO2, Ven 51.7 (*)    Acid-base deficit 4.6 (*)    All other components within normal limits  AMMONIA - Abnormal; Notable for the following components:   Ammonia 36 (*)    All other components within normal limits  ACETAMINOPHEN LEVEL - Abnormal; Notable for the following components:   Acetaminophen (Tylenol), Serum <10 (*)    All other components within normal limits  FIBRINOGEN - Abnormal; Notable for the following components:   Fibrinogen 115 (*)    All other components within normal limits  CBG MONITORING, ED - Abnormal; Notable for the following components:   Glucose-Capillary 36 (*)    All other components within normal limits   Otherwise labs showing:  Recent Labs  Lab 05/29/20 1013  NA 136  K 3.9  CO2 19*  GLUCOSE 59*  BUN 14  CREATININE 0.89  CALCIUM 7.5*    Cr   Stable,  Lab Results  Component Value Date   CREATININE 0.89 05/29/2020   CREATININE 0.73 05/18/2020   CREATININE 0.78 05/04/2020    Recent Labs  Lab 05/29/20 1013  AST 85*  ALT 31  ALKPHOS 373*  BILITOT 1.7*  PROT 5.1*  ALBUMIN 1.7*   Lab Results  Component Value Date   CALCIUM 7.5 (L) 05/29/2020        WBC      Component Value  Date/Time   WBC 26.2 (H) 05/29/2020 1013   LYMPHSABS 0.3 (L) 05/29/2020 1013   MONOABS 0.7 05/29/2020 1013   EOSABS 0.0 05/29/2020 1013   BASOSABS 0.0  05/29/2020 1013    Plt: Lab Results  Component Value Date   PLT 71 (L) 05/29/2020    Lactic Acid, Venous    Component Value Date/Time   LATICACIDVEN 6.6 (HH) 05/29/2020 1213    Lactic Acid, Venous    Component Value Date/Time   LATICACIDVEN 5.9 (HH) 05/29/2020 1810   Lactic Acid, Venous    Component Value Date/Time   LATICACIDVEN 4.9 (HH) 05/29/2020 2232     Procalcitonin 4.6  Venous  Blood Gas result:  pH 7.324  pCO2 40.5        HG/HCT  Up from baseline see below    Component Value Date/Time   HGB 10.2 (L) 05/29/2020 1013   HGB 8.7 (L) 05/18/2020 1112   HCT 29.6 (L) 05/29/2020 1013   MCV 99.7 05/29/2020 1013    No results for input(s): LIPASE, AMYLASE in the last 168 hours. Recent Labs  Lab 05/29/20 2030  AMMONIA 36*   Acute hepatitis serologies unremarkable     ECG: Ordered Personally reviewed by me showing: HR : 121 Rhythm:  Sinus tachycardia    no evidence of ischemic changes QTC 473  Peritoneal fluid showing large amount of WBCs 9450 With neutrophil Count of 89    DM  labs:  HbA1C: Recent Labs    11/30/19 1136 03/31/20 1130  HGBA1C 7.9* 6.1*     CBG (last 3)   Recent Labs    05/29/20 1750 05/29/20 2030 05/29/20 2206  GLUCAP 97 95 82       UA  no evidence of UTI     Urine analysis:    Component Value Date/Time   COLORURINE AMBER (A) 05/29/2020 1248   APPEARANCEUR HAZY (A) 05/29/2020 1248   LABSPEC 1.018 05/29/2020 1248   PHURINE 5.0 05/29/2020 1248   GLUCOSEU NEGATIVE 05/29/2020 [place] 05/29/2020 [place] 05/29/2020 [place] 05/29/2020 [place] 05/29/2020 1248   NITRITE NEGATIVE 05/29/2020 1248   LEUKOCYTESUR NEGATIVE 05/29/2020 1248       Ordered   CXR - Left mid and lower lung zone airspace densities compatible with pneumonia.  CTabd/pelvis -  Hepatic steatosis,  4 x 4 pancreatic head mass    CTA chest -  no PE,  no evidence of infiltrate pleural  effusions causing atelectasis  ED Triage Vitals  Enc Vitals Group     BP 05/29/20 0911 (!) 165/133     Pulse Rate 05/29/20 0911 (!) 125     Resp 05/29/20 0911 (!) 26     Temp 05/29/20 0911 97.6 F (36.4 C)     Temp Source 05/29/20 0911 Oral     SpO2 05/29/20 0911 96 %     Weight 05/29/20 0911 124 lb (56.2 kg)     Height 05/29/20 0911 5' 4"  (1.626 m)     Head Circumference --      Peak Flow --      Pain Score 05/29/20 1821 0     Pain Loc --      Pain Edu? --      Excl. in [place]? --   TMAX(24)@       Latest  Blood pressure 100/67, pulse (!) 109, temperature 97.6 F (36.4 C), temperature source Oral, resp. rate 17, height 5'  4" (1.626 m), weight 56.2 kg, SpO2 96 %.  Review of Systems:    Pertinent positives include:  Fevers, chills, fatigue abdominal pain, nausea, vomiting, diarrhea,  Constitutional:  No weight loss, night sweats, weight loss  HEENT:  No headaches, Difficulty swallowing,Tooth/dental problems,Sore throat,  No sneezing, itching, ear ache, nasal congestion, post nasal drip,  Cardio-vascular:  No chest pain, Orthopnea, PND, anasarca, dizziness, palpitations.no Bilateral lower extremity swelling  GI:  No heartburn, indigestion,  change in bowel habits, loss of appetite, melena, blood in stool, hematemesis Resp:  no shortness of breath at rest. No dyspnea on exertion, No excess mucus, no productive cough, No non-productive cough, No coughing up of blood.No change in color of mucus.No wheezing. Skin:  no rash or lesions. No jaundice GU:  no dysuria, change in color of urine, no urgency or frequency. No straining to urinate.  No flank pain.  Musculoskeletal:  No joint pain or no joint swelling. No decreased range of motion. No back pain.  Psych:  No change in mood or affect. No depression or anxiety. No memory loss.  Neuro: no localizing neurological complaints, no tingling, no weakness, no double vision, no gait abnormality, no slurred speech, no confusion  All  systems reviewed and apart from [place] all are negative  Past Medical History:   Past Medical History:  Diagnosis Date  . Diabetes mellitus without complication ([place])   . Hypertension   . Pancreatic cancer [facility])      Past Surgical History:  Procedure Laterality Date  . APPENDECTOMY    . BACK SURGERY    . CHOLECYSTECTOMY    . GASTRIC BYPASS OPEN  08/2019   aborted whipple, GJ bypass for obstruction  . IR GASTROSTOMY TUBE REMOVAL  11/30/2019  . IR IMAGING GUIDED PORT INSERTION  12/08/2019    Social History:  Ambulatory  walker     reports that she has never smoked. She has never used smokeless tobacco. She reports that she does not drink alcohol and does not use drugs.     Family History:   Family History  Problem Relation Age of Onset  . Diabetes Mother   . Diabetes Sister   . Diabetes Brother     Allergies: No Known Allergies   Prior to Admission medications   Medication Sig Start Date End Date Taking? Authorizing Provider  acetaminophen (TYLENOL) 500 MG tablet Take 500-1,000 mg by mouth daily as needed for moderate pain.    [provider]  amLODipine (NORVASC) 5 MG tablet Take 1 tablet (5 mg total) by mouth daily. 12/22/19   Erline Hau, MD  ASPIRIN LOW DOSE 81 MG EC tablet Take 81 mg by mouth daily. 07/31/19   [provider]  Calcium Carb-Cholecalciferol (CALCIUM/VITAMIN D) 500-200 MG-UNIT TABS Take 1 tablet by mouth in the morning and at bedtime.  11/10/19   [provider]  ciprofloxacin (CIPRO) 500 MG tablet Take 1 tablet (500 mg total) by mouth 2 (two) times daily. 05/19/20   Alla Feeling, NP  furosemide (LASIX) 20 MG tablet Take 0.5 (half) tablet every other day for swelling 05/18/20   Cira Rue K, NP  glucose blood test strip 1 each by Other route in the morning and at bedtime. Dx E11.9  For OneTouch Ultra 2 01/28/20   Isaac Bliss, Rayford Halsted, MD  HYDROcodone-acetaminophen (NORCO/VICODIN) 5-325 MG tablet Take 1  tablet by mouth every 6 (six) hours as needed. 12/30/19   Truitt Merle, MD  hydrocortisone (ANUSOL-HC) 2.5 %  rectal cream Place 1 application rectally 2 (two) times daily. 04/06/20   Erline Hau, MD  KLOR-CON M20 20 MEQ tablet TAKE 1 TABLET BY MOUTH EVERY DAY 03/09/20   Alla Feeling, NP  lidocaine-prilocaine (EMLA) cream Apply 1 application topically as needed. 12/16/19   Alla Feeling, NP  Magnesium 250 MG TABS Take 1 tablet (250 mg total) by mouth daily. 12/07/19   Isaac Bliss, Rayford Halsted, MD  metFORMIN (GLUCOPHAGE) 1000 MG tablet TAKE 1 TABLET BY MOUTH TWICE A DAY 03/08/20   Isaac Bliss, Rayford Halsted, MD  metoprolol tartrate (LOPRESSOR) 50 MG tablet Take 1 tablet (50 mg total) by mouth 2 (two) times daily. 12/07/19   Isaac Bliss, Rayford Halsted, MD  ondansetron (ZOFRAN) 8 MG tablet TAKE 1 TABLET (8 MG TOTAL) BY MOUTH 2 (TWO) TIMES DAILY AS NEEDED (NAUSEA OR VOMITING). 05/17/20   Truitt Merle, MD  OneTouch Delica Lancets 61P MISC Use twice daily for glucose control, Dx E11.9 02/03/20   Isaac Bliss, Rayford Halsted, MD  pantoprazole (PROTONIX) 20 MG tablet Take 1 tablet (20 mg total) by mouth daily. 11/22/19   Truitt Merle, MD  prochlorperazine (COMPAZINE) 10 MG tablet Take 1 tablet (10 mg total) by mouth every 6 (six) hours as needed (Nausea or vomiting). 04/06/20   Truitt Merle, MD  traMADol (ULTRAM) 50 MG tablet Take 0.5-1 tablets (25-50 mg total) by mouth every 6 (six) hours as needed. Patient not taking: Reported on 12/25/2019 11/22/19   Truitt Merle, MD  vitamin B-12 (CYANOCOBALAMIN) 1000 MCG tablet Take 1 tablet (1,000 mcg total) by mouth daily. 11/22/19   Truitt Merle, MD   Physical Exam: Vitals with BMI 05/29/2020 05/29/2020 05/29/2020  Height - - -  Weight - - -  BMI - - -  Systolic 509 326 712  Diastolic 67 63 64  Pulse 458 113 110    1. General:  in No  Acute distress    Chronically ill  -appearing 2. Psychological: Alert and  Oriented 3. Head/ENT:     Dry Mucous Membranes                           Head Non traumatic, neck supple                            Poor Dentition 4. SKIN:  decreased Skin turgor,  Skin clean Dry and intact no rash 5. Heart: Regular rate and rhythm no Murmur, no Rub or gallop 6. Lungs:   no wheezes or crackles   7. Abdomen: Soft, diffusely tender, distended  obese diminished bowel sounds present 8. Lower extremities: no clubbing, cyanosis, 2+ edema 9. Neurologically Grossly intact, moving all 4 extremities equally  10. MSK: Normal range of motion   All other LABS:     Recent Labs  Lab 05/29/20 1013  WBC 26.2*  NEUTROABS 22.5*  HGB 10.2*  HCT 29.6*  MCV 99.7  PLT 71*     Recent Labs  Lab 05/29/20 1013  NA 136  K 3.9  CL 106  CO2 19*  GLUCOSE 59*  BUN 14  CREATININE 0.89  CALCIUM 7.5*     Recent Labs  Lab 05/29/20 1013  AST 85*  ALT 31  ALKPHOS 373*  BILITOT 1.7*  PROT 5.1*  ALBUMIN 1.7*    Cultures:    Component Value Date/Time   SDES  05/18/2020 1409  URINE, CLEAN CATCH Performed at [facility], [address], [place], [place] [address]    SPECREQUEST  05/18/2020 1409    NONE Performed at [facility], [place] [address] [place], [place] [address]    CULT >=100,000 COLONIES/mL ESCHERICHIA COLI (A) 05/18/2020 1409   REPTSTATUS 05/21/2020 FINAL 05/18/2020 1409     Radiological Exams on Admission: CT Angio Chest PE W and/or Wo Contrast  Result Date: 05/29/2020 CLINICAL DATA:  Syncopal episode, acute abdominal pain, weakness, history of pancreatic cancer on chemotherapy EXAM: CT ANGIOGRAPHY CHEST CT ABDOMEN AND PELVIS WITH CONTRAST TECHNIQUE: Multidetector CT imaging of the chest was performed using the standard protocol during bolus administration of intravenous contrast. Multiplanar CT image reconstructions and MIPs were obtained to evaluate the vascular anatomy. Multidetector CT imaging of the abdomen and pelvis was performed using the standard protocol during  bolus administration of intravenous contrast. CONTRAST:  163m OMNIPAQUE IOHEXOL 350 MG/ML SOLN IV. No oral contrast. COMPARISON:  CT abdomen and pelvis 03/06/2020, CT chest 11/29/2019 FINDINGS: CTA CHEST FINDINGS Cardiovascular: Atherosclerotic calcifications aorta and coronary arteries. RIGHT jugular Port-A-Cath with tip in RIGHT atrium. Aorta normal caliber without aneurysm or dissection. Heart normal size. No pericardial effusion. Pulmonary arteries adequately opacified and grossly patent. No evidence of pulmonary embolism. Mediastinum/Nodes: Soft is unremarkable. Base of cervical region normal appearance. No thoracic adenopathy. Lungs/Pleura: Moderate pleural effusions bilaterally. Compressive atelectasis of BILATERAL lower lobes and posterior upper lobes minimal infiltrate in upper lobes. No pneumothorax. No definite pulmonary mass/nodule. Musculoskeletal: Diffuse osseous demineralization. No discrete bone lesion. Review of the MIP images confirms the above findings. CT ABDOMEN and PELVIS FINDINGS Hepatobiliary: Marked hepatic steatosis. Gallbladder surgically absent. CBD stent. No hepatic mass or biliary dilatation. Small nodule anterior to the LEFT lobe of the liver on the previous exam is poorly demonstrated on the current study, subjectively smaller but difficult to accurately measure due to ascites. Pancreas: Mass at pancreatic head/uncinate 4.0 x 4.1 cm, appears slightly smaller. Mild pancreatic ductal dilatation at body and proximal tail. No additional mass. 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 Adrenal glands otherwise unremarkable. Small BILATERAL renal cysts. No hydronephrosis, hydroureter, or urinary tract calcification. Bladder decompressed. Stomach/Bowel: Scattered bowel wall thickening, mild in degree, may be related to ascites. Diverticulosis of descending and sigmoid colon without definite wall thickening to  suggest diverticulitis. Appendix surgically absent by history. Prior gastric bypass surgery. Vascular/Lymphatic: Extensive atherosclerotic calcifications aorta and iliac arteries without aneurysm. No definite adenopathy. Reproductive: Atrophic uterus with unremarkable ovaries Other: Significant ascites. No definite peritoneal based mass. Scattered subcutaneous edema. No free air. No hernia. Musculoskeletal: Osseous demineralization. Review of the MIP images confirms the above findings. IMPRESSION: No evidence of pulmonary embolism. BILATERAL pleural effusions with compressive atelectasis of the lower lobes and posterior upper lobes. Slight decrease in size of pancreatic head mass. Marked hepatic steatosis. Slight decrease in size of nodule at the anterior surface LEFT lobe liver. CBD stent without biliary dilatation. Scattered bowel wall thickening, may be related to ascites, unable to exclude enteritis. Distal colonic diverticulosis without evidence of diverticulitis. LEFT adrenal myelolipoma. Aortic Atherosclerosis (ICD10-I70.0). Electronically Signed   By: MLavonia DanaM.D.   On: 05/29/2020 15:00   CT ABDOMEN PELVIS W CONTRAST  Result Date: 05/29/2020 CLINICAL DATA:  Syncopal episode, acute abdominal pain, weakness, history of pancreatic cancer on chemotherapy EXAM: CT ANGIOGRAPHY CHEST CT ABDOMEN AND PELVIS WITH CONTRAST TECHNIQUE: Multidetector CT  imaging of the chest was performed using the standard protocol during bolus administration of intravenous contrast. Multiplanar CT image reconstructions and MIPs were obtained to evaluate the vascular anatomy. Multidetector CT imaging of the abdomen and pelvis was performed using the standard protocol during bolus administration of intravenous contrast. CONTRAST:  1106m OMNIPAQUE IOHEXOL 350 MG/ML SOLN IV. No oral contrast. COMPARISON:  CT abdomen and pelvis 03/06/2020, CT chest 11/29/2019 FINDINGS: CTA CHEST FINDINGS Cardiovascular: Atherosclerotic calcifications  aorta and coronary arteries. RIGHT jugular Port-A-Cath with tip in RIGHT atrium. Aorta normal caliber without aneurysm or dissection. Heart normal size. No pericardial effusion. Pulmonary arteries adequately opacified and grossly patent. No evidence of pulmonary embolism. Mediastinum/Nodes: Soft is unremarkable. Base of cervical region normal appearance. No thoracic adenopathy. Lungs/Pleura: Moderate pleural effusions bilaterally. Compressive atelectasis of BILATERAL lower lobes and posterior upper lobes minimal infiltrate in upper lobes. No pneumothorax. No definite pulmonary mass/nodule. Musculoskeletal: Diffuse osseous demineralization. No discrete bone lesion. Review of the MIP images confirms the above findings. CT ABDOMEN and PELVIS FINDINGS Hepatobiliary: Marked hepatic steatosis. Gallbladder surgically absent. CBD stent. No hepatic mass or biliary dilatation. Small nodule anterior to the LEFT lobe of the liver on the previous exam is poorly demonstrated on the current study, subjectively smaller but difficult to accurately measure due to ascites. Pancreas: Mass at pancreatic head/uncinate 4.0 x 4.1 cm, appears slightly smaller. Mild pancreatic ductal dilatation at body and proximal tail. No additional mass. 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 Adrenal glands otherwise unremarkable. Small BILATERAL renal cysts. No hydronephrosis, hydroureter, or urinary tract calcification. Bladder decompressed. Stomach/Bowel: Scattered bowel wall thickening, mild in degree, may be related to ascites. Diverticulosis of descending and sigmoid colon without definite wall thickening to suggest diverticulitis. Appendix surgically absent by history. Prior gastric bypass surgery. Vascular/Lymphatic: Extensive atherosclerotic calcifications aorta and iliac arteries without aneurysm. No definite adenopathy. Reproductive: Atrophic uterus with  unremarkable ovaries Other: Significant ascites. No definite peritoneal based mass. Scattered subcutaneous edema. No free air. No hernia. Musculoskeletal: Osseous demineralization. Review of the MIP images confirms the above findings. IMPRESSION: No evidence of pulmonary embolism. BILATERAL pleural effusions with compressive atelectasis of the lower lobes and posterior upper lobes. Slight decrease in size of pancreatic head mass. Marked hepatic steatosis. Slight decrease in size of nodule at the anterior surface LEFT lobe liver. CBD stent without biliary dilatation. Scattered bowel wall thickening, may be related to ascites, unable to exclude enteritis. Distal colonic diverticulosis without evidence of diverticulitis. LEFT adrenal myelolipoma. Aortic Atherosclerosis (ICD10-I70.0). Electronically Signed   By: MLavonia DanaM.D.   On: 05/29/2020 15:00   DG Chest Port 1 View  Result Date: 05/29/2020 CLINICAL DATA:  Evaluate for sepsis. Shortness of breath and cough. History of pancreas cancer. EXAM: PORTABLE CHEST 1 VIEW COMPARISON:  CT chest 11/29/2019 FINDINGS: Right chest wall port a catheter identified with tip at the cavoatrial junction. Normal heart size. A small left pleural effusion is suspected. Additionally, there are patchy airspace densities within the mid and lower left lung zone. Right lung appears clear. IMPRESSION: Left mid and lower lung zone airspace densities compatible with pneumonia. Suspect small left pleural effusion. Electronically Signed   By: TKerby MoorsM.D.   On: 05/29/2020 10:34    Chart has been reviewed   Assessment/Plan   74y.o. female with medical history significant of  Pancreatic cancer, DM2, HTN, Jehovah's Witness - does not accept blood products Admitted for severe sepsis most  likely secondary to SBP  Present on Admission: . Pancreatic cancer [facility]) -oncology aware appreciate their input.  Patient with fairly poor prognosis, would likely benefit from goals of care  discussion with primary oncologist and possible referral to palliative care  . Hypertension -hold blood pressure medications given hypotension  . Ascites -patient with severe hypoalbuminemia likely resulting in ascites as well as pleural effusions Also could not rule out ascites secondary to intra-abdominal malignancy versus liver disease. Diagnostic LP obtained by ER and will continue to follow At this point showing evidence of SBP  SBP -covered with Zosyn await results of cultures Patient at this point refused albumin family will discuss in a.m. Would benefit from GI consult  . Sepsis ([place]) -   -SIRS criteria met with  elevated white blood cell count,       Component Value Date/Time   WBC 26.2 (H) 05/29/2020 1013   LYMPHSABS 0.3 (L) 05/29/2020 1013     tachycardia   ,  RR >20 Today's Vitals   05/29/20 1800 05/29/20 1821 05/29/20 1845 05/29/20 2000  BP: 100/67  99/62 100/64  Pulse: (!) 109  (!) 108 (!) 106  Resp: 17  (!) 23 20  Temp:      TempSrc:      SpO2: 96%  95% 97%  Weight:      Height:      PainSc:  0-No pain     -Most likely source being: SBP intra-abdominal,     Patient meeting criteria for Severe sepsis with    evidence of end organ damage/organ dysfunction such as   elevated lactic acid >2     Component Value Date/Time   LATICACIDVEN 5.9 (HH) 05/29/2020 1810      SBP<90 mmhg or MAP < 65 mmhg,    Acute liver injury with INR > 1.5 or Bilirubin > 2,   Acute thrombocytopenia with platelet < 100,000      Component Value Date/Time   PLT 71 (L) 05/29/2020 1013   PLT 180 05/18/2020 1112    Patient is meeting criteria for SEPTIC SHOCK with  lactic acid > 4 or multiple SBP readings below 90 mmhg or MAP reading below 65 mmhg   - Obtain serial lactic acid and procalcitonin level.  - Initiated IV antibiotics   - await results of blood and urine culture  - Rehydrate aggressively       30cc/kg fluid   8:19 PM  . Acute lower UTI  -UA showing no evidence of  UTI currently patient recently been on Cipro. Await results of urine culture  . Elevated LFTs -in the setting of pancreatic mass.  And septic shock Hepatitis serologies unremarkable CT scan showed no evidence of cirrhosis with some hepatic steatosis  . Thrombocytopenia ([place]) -defer to oncology could be related to chemotherapy versus liver disease, versus sepsis although no evidence of cirrhosis on CT scan making it somewhat less likely could be also secondary to malnutrition  Severely elevated lactic acid -most likely secondary to severe sepsis  General surgery was consulted felt that ischemic bowel was unlikely.  No surgical indication at this time Continue to try to rehydrate being mindful of that hypoalbuminemia will cause fluid accumulation and third spacing  Hypoalbuminemia-check prealbumin nutritional consult.  Assess liver function. Would probably benefit from some albumin infusion with patient at this time refused would like to discuss further with family patient is Jehovah's Witness   Hypoglycemia - in the setting of liver dis function and pancreatic ca.  Seem improving, continue to follow frequent CBG  Other plan as per orders.  DVT prophylaxis:  SCD      Code Status:    Code Status: Not on file  DNI  as per patient   I had personally discussed CODE STATUS with patient and family    Family Communication:   Family   at  Bedside  plan of care was discussed on the phone with   Son in [place],  Overall very poor prognosis  Disposition Plan:     To home once workup is complete and patient is stable   Following barriers for discharge:                            Electrolytes corrected                                                           Pain controlled with PO medications                                white count improving able to transition to PO antibiotics                             Will need to be able to tolerate PO                                                       Will need consultants to evaluate patient prior to discharge                       Would benefit from PT/OT eval prior to DC  Ordered                                     Transition of care consulted                   Nutrition    consulted                                     Palliative care    consulted                                       Consults called: general surgery, oncology is aware, notified [facility] as well though epic  Admission status:  ED Disposition    ED Disposition Condition [place]: [place] [[id number]]  Level of Care: Stepdown [14]  Admit to SDU based on following criteria: Hemodynamic compromise or significant risk of instability:  Patient requiring short term acute titration and management of vasoactive drips, and invasive monitoring (i.e., CVP and Arterial line).  May admit patient to [facility] or  [facility] if equivalent level of care is available:: No  Covid Evaluation: Confirmed COVID Negative  Diagnosis: Sepsis [facility]) [[id number]]  Admitting Physician: Toy Baker [[id number]]  Attending Physician: Toy Baker [[id number]]  Estimated length of stay: 3 - 4 days  Certification:: I certify this patient will need inpatient services for at least 2 midnights         inpatient     I Expect 2 midnight stay secondary to severity of patient's current illness need for inpatient interventions justified by the following:  hemodynamic instability despite optimal treatment (tachycardia hypotension   Severe lab/radiological/exam abnormalities including:    elevated lactic acid and extensive comorbidities including:  DM2    malignancy,   That are currently affecting medical management.   I expect  patient to be hospitalized for 2 midnights requiring inpatient medical care.  Patient is at high risk for adverse outcome (such as loss of life or disability) if not treated.  Indication for inpatient stay as follows:     Hemodynamic instability despite maximal medical therapy,     inability to maintain oral hydration    Need for operative/procedural  intervention    Need for IV antibiotics, IV fluids,   Level of care     SDU tele indefinitely please discontinue once patient no longer qualifies COVID-19 Labs    Lab Results  Component Value Date   [place] NEGATIVE 05/29/2020     Precautions: admitted as  Covid Negative     PPE: Used by the provider:   P100  eye Goggles,  Gloves    Angelos Wasco 05/30/2020, 12:32 AM     [facility]     after 2 AM please page floor coverage PA If 7AM-7PM, please contact the day team taking care of the patient using [email or web address]   Patient was evaluated in the context of the global COVID-19 pandemic, which necessitated consideration that the patient might be at risk for infection with the SARS-CoV-2 virus that causes COVID-19. Institutional protocols and algorithms that pertain to the evaluation of patients at risk for COVID-19 are in a state of rapid change based on information released by regulatory bodies including the CDC and federal and state organizations. These policies and algorithms were followed during the patient's care.

## 2020-05-29 NOTE — ED Notes (Signed)
Bed alarm has been placed and activated for pt's safety. °

## 2020-05-29 NOTE — ED Provider Notes (Signed)
Care transferred to me.  Patient has had her surgery consult who recommends no surgery. Paracentesis performed for infection workup. She was also noted to be hypoglycemic (asymptomatic) and given D50. Dr. Roel Cluck consulted for admission.   .Paracentesis  Date/Time: 05/29/2020 5:43 PM Performed by: Sherwood Gambler, MD Authorized by: Sherwood Gambler, MD   Consent:    Consent obtained:  Verbal and written   Consent given by:  Patient   Risks, benefits, and alternatives were discussed: yes     Risks discussed:  Bleeding, bowel perforation, infection and pain Pre-procedure details:    Procedure purpose:  Diagnostic   Preparation: Patient was prepped and draped in usual sterile fashion   Anesthesia:    Anesthesia method:  Local infiltration   Local anesthetic:  Lidocaine 1% w/o epi Procedure details:    Ultrasound guidance: yes     Puncture site:  R lower quadrant   Fluid removed amount:  60 cc   Fluid appearance:  Yellow   Dressing:  Adhesive bandage Post-procedure details:    Procedure completion:  Tolerated well, no immediate complications      Sherwood Gambler, MD 05/29/20 1825

## 2020-05-30 ENCOUNTER — Encounter: Payer: Self-pay | Admitting: Hematology

## 2020-05-30 DIAGNOSIS — E162 Hypoglycemia, unspecified: Secondary | ICD-10-CM | POA: Diagnosis present

## 2020-05-30 DIAGNOSIS — D696 Thrombocytopenia, unspecified: Secondary | ICD-10-CM

## 2020-05-30 DIAGNOSIS — R7989 Other specified abnormal findings of blood chemistry: Secondary | ICD-10-CM

## 2020-05-30 DIAGNOSIS — K652 Spontaneous bacterial peritonitis: Secondary | ICD-10-CM | POA: Diagnosis present

## 2020-05-30 DIAGNOSIS — L8990 Pressure ulcer of unspecified site, unspecified stage: Secondary | ICD-10-CM | POA: Insufficient documentation

## 2020-05-30 DIAGNOSIS — C259 Malignant neoplasm of pancreas, unspecified: Secondary | ICD-10-CM

## 2020-05-30 LAB — PHOSPHORUS: Phosphorus: 3.2 mg/dL (ref 2.5–4.6)

## 2020-05-30 LAB — CBC WITH DIFFERENTIAL/PLATELET
Abs Immature Granulocytes: 1.3 K/uL — ABNORMAL HIGH (ref 0.00–0.07)
Basophils Absolute: 0.2 K/uL — ABNORMAL HIGH (ref 0.0–0.1)
Basophils Relative: 1 %
Eosinophils Absolute: 0 K/uL (ref 0.0–0.5)
Eosinophils Relative: 0 %
HCT: 24.5 % — ABNORMAL LOW (ref 36.0–46.0)
Hemoglobin: 8.5 g/dL — ABNORMAL LOW (ref 12.0–15.0)
Immature Granulocytes: 5 %
Lymphocytes Relative: 2 %
Lymphs Abs: 0.6 K/uL — ABNORMAL LOW (ref 0.7–4.0)
MCH: 35.1 pg — ABNORMAL HIGH (ref 26.0–34.0)
MCHC: 34.7 g/dL (ref 30.0–36.0)
MCV: 101.2 fL — ABNORMAL HIGH (ref 80.0–100.0)
Monocytes Absolute: 1.3 K/uL — ABNORMAL HIGH (ref 0.1–1.0)
Monocytes Relative: 5 %
Neutro Abs: 22.5 K/uL — ABNORMAL HIGH (ref 1.7–7.7)
Neutrophils Relative %: 87 %
Platelets: 87 K/uL — ABNORMAL LOW (ref 150–400)
RBC: 2.42 MIL/uL — ABNORMAL LOW (ref 3.87–5.11)
RDW: 19.7 % — ABNORMAL HIGH (ref 11.5–15.5)
WBC: 25.9 K/uL — ABNORMAL HIGH (ref 4.0–10.5)
nRBC: 1.2 % — ABNORMAL HIGH (ref 0.0–0.2)

## 2020-05-30 LAB — URINE CULTURE: Culture: NO GROWTH

## 2020-05-30 LAB — GLUCOSE, CAPILLARY
Glucose-Capillary: 129 mg/dL — ABNORMAL HIGH (ref 70–99)
Glucose-Capillary: 133 mg/dL — ABNORMAL HIGH (ref 70–99)
Glucose-Capillary: 142 mg/dL — ABNORMAL HIGH (ref 70–99)
Glucose-Capillary: 149 mg/dL — ABNORMAL HIGH (ref 70–99)
Glucose-Capillary: 64 mg/dL — ABNORMAL LOW (ref 70–99)
Glucose-Capillary: 76 mg/dL (ref 70–99)
Glucose-Capillary: 76 mg/dL (ref 70–99)
Glucose-Capillary: 77 mg/dL (ref 70–99)

## 2020-05-30 LAB — COMPREHENSIVE METABOLIC PANEL WITH GFR
ALT: 29 U/L (ref 0–44)
AST: 73 U/L — ABNORMAL HIGH (ref 15–41)
Albumin: 1.4 g/dL — ABNORMAL LOW (ref 3.5–5.0)
Alkaline Phosphatase: 281 U/L — ABNORMAL HIGH (ref 38–126)
Anion gap: 9 (ref 5–15)
BUN: 14 mg/dL (ref 8–23)
CO2: 20 mmol/L — ABNORMAL LOW (ref 22–32)
Calcium: 7 mg/dL — ABNORMAL LOW (ref 8.9–10.3)
Chloride: 108 mmol/L (ref 98–111)
Creatinine, Ser: 0.95 mg/dL (ref 0.44–1.00)
GFR, Estimated: >60 mL/min
Glucose, Bld: 86 mg/dL (ref 70–99)
Potassium: 4.5 mmol/L (ref 3.5–5.1)
Sodium: 137 mmol/L (ref 135–145)
Total Bilirubin: 1.6 mg/dL — ABNORMAL HIGH (ref 0.3–1.2)
Total Protein: 4.3 g/dL — ABNORMAL LOW (ref 6.5–8.1)

## 2020-05-30 LAB — MAGNESIUM: Magnesium: 1.1 mg/dL — ABNORMAL LOW (ref 1.7–2.4)

## 2020-05-30 LAB — HEPATITIS PANEL, ACUTE
HCV Ab: NONREACTIVE
Hep A IgM: NONREACTIVE
Hep B C IgM: NONREACTIVE
Hepatitis B Surface Ag: NONREACTIVE

## 2020-05-30 LAB — LACTIC ACID, PLASMA
Lactic Acid, Venous: 4.9 mmol/L (ref 0.5–1.9)
Lactic Acid, Venous: 3.8 mmol/L (ref 0.5–1.9)
Lactic Acid, Venous: 2.7 mmol/L (ref 0.5–1.9)

## 2020-05-30 LAB — CBG MONITORING, ED: Glucose-Capillary: 79 mg/dL (ref 70–99)

## 2020-05-30 LAB — CANCER ANTIGEN 19-9: CA 19-9: 180 U/mL — ABNORMAL HIGH (ref 0–35)

## 2020-05-30 LAB — VITAMIN B12: Vitamin B-12: >7500 pg/mL — ABNORMAL HIGH (ref 180–914)

## 2020-05-30 LAB — PREALBUMIN: Prealbumin: <5 mg/dL — ABNORMAL LOW (ref 18–38)

## 2020-05-30 LAB — HEMOGLOBIN A1C
Hgb A1c MFr Bld: 6 % — ABNORMAL HIGH (ref 4.8–5.6)
Mean Plasma Glucose: 125.5 mg/dL

## 2020-05-30 LAB — PATHOLOGIST SMEAR REVIEW

## 2020-05-30 LAB — MRSA PCR SCREENING: MRSA by PCR: NEGATIVE

## 2020-05-30 LAB — FOLATE: Folate: 7.3 ng/mL

## 2020-05-30 LAB — TSH: TSH: 1.664 u[IU]/mL (ref 0.350–4.500)

## 2020-05-30 MED ORDER — ASCORBIC ACID 500 MG PO TABS: 500.0000 mg | ORAL_TABLET | Freq: Two times a day (BID) | ORAL | Status: DC

## 2020-05-30 MED ORDER — ADULT MULTIVITAMIN W/MINERALS CH
1.0000 | ORAL_TABLET | Freq: Every day | ORAL | Status: DC
  Administered 2020-05-30 – 2020-06-08 (×10): 1 via ORAL
  Filled 2020-05-30 (×9): qty 1

## 2020-05-30 MED ORDER — ORAL CARE MOUTH RINSE
15.0000 mL | Freq: Two times a day (BID) | OROMUCOSAL | Status: DC
  Administered 2020-05-30 – 2020-06-08 (×19): 15 mL via OROMUCOSAL

## 2020-05-30 MED ORDER — JUVEN PO PACK
1.0000 | PACK | Freq: Two times a day (BID) | ORAL | Status: DC
  Administered 2020-05-30 – 2020-06-07 (×16): 1 via ORAL
  Filled 2020-05-30 (×18): qty 1

## 2020-05-30 MED ORDER — FUROSEMIDE 10 MG/ML IJ SOLN
20.0000 mg | Freq: Once | INTRAMUSCULAR | Status: AC
  Administered 2020-05-30: 11:00:00 20 mg via INTRAVENOUS
  Filled 2020-05-30: qty 2

## 2020-05-30 MED ORDER — CHLORHEXIDINE GLUCONATE CLOTH 2 % EX PADS
6.0000 | MEDICATED_PAD | Freq: Every day | CUTANEOUS | Status: DC
  Administered 2020-05-30 – 2020-06-08 (×10): 6 via TOPICAL

## 2020-05-30 MED ORDER — SODIUM CHLORIDE 0.9 % IV BOLUS
500.0000 mL | Freq: Once | INTRAVENOUS | Status: AC
  Administered 2020-05-30: 01:00:00 500 mL via INTRAVENOUS

## 2020-05-30 MED ORDER — MAGNESIUM SULFATE 4 GM/100ML IV SOLN
4.0000 g | Freq: Once | INTRAVENOUS | Status: AC
  Administered 2020-05-30: 09:00:00 4 g via INTRAVENOUS
  Filled 2020-05-30: qty 100

## 2020-05-30 MED ORDER — ASCORBIC ACID 500 MG PO TABS
500.0000 mg | ORAL_TABLET | Freq: Every day | ORAL | Status: DC
  Administered 2020-05-30 – 2020-06-08 (×10): 500 mg via ORAL
  Filled 2020-05-30 (×9): qty 1

## 2020-05-30 MED ORDER — ZINC SULFATE 220 (50 ZN) MG PO CAPS
220.0000 mg | ORAL_CAPSULE | Freq: Every day | ORAL | Status: DC
Start: 2020-05-30 — End: 2020-06-08
  Administered 2020-05-30 – 2020-06-08 (×10): 220 mg via ORAL
  Filled 2020-05-30 (×9): qty 1

## 2020-05-30 MED ORDER — SODIUM CHLORIDE 0.9 % IV SOLN
2.0000 g | INTRAVENOUS | Status: DC
  Administered 2020-05-30 – 2020-06-07 (×9): 2 g via INTRAVENOUS
  Filled 2020-05-30: qty 2
  Filled 2020-05-30: qty 1.88
  Filled 2020-05-30: qty 20
  Filled 2020-05-30 (×2): qty 2
  Filled 2020-05-30 (×5): qty 20

## 2020-05-30 MED ORDER — ALBUMIN HUMAN 25 % IV SOLN
50.0000 g | Freq: Four times a day (QID) | INTRAVENOUS | Status: AC
  Administered 2020-05-30 – 2020-05-31 (×4): 50 g via INTRAVENOUS
  Filled 2020-05-30 (×3): qty 200

## 2020-05-30 MED ORDER — PROSOURCE PLUS PO LIQD
30.0000 mL | Freq: Two times a day (BID) | ORAL | Status: DC
  Administered 2020-05-30 – 2020-06-07 (×13): 30 mL via ORAL
  Filled 2020-05-30 (×14): qty 30

## 2020-05-30 MED ORDER — FUROSEMIDE 20 MG PO TABS
20.0000 mg | ORAL_TABLET | Freq: Every day | ORAL | Status: DC
  Administered 2020-05-31 – 2020-06-07 (×8): 20 mg via ORAL
  Filled 2020-05-30 (×8): qty 1

## 2020-05-30 NOTE — TOC Initial Note (Signed)
Transition of Care [facility]) - Initial/Assessment Note    Patient Details  Name: Tamara Wood MRN: [id number] Date of Birth: Jan 30, 1946  Transition of Care [facility]) CM/SW Contact:    Leeroy Cha, RN Phone Number: 05/30/2020, 7:32 AM  Clinical Narrative:                 74 y.o. female with medical history significant of  Pancreatic cancer, DM2, HTN, Jehovah's Witness - does not accept blood products    Presented with increased fatigue and abdominal pain Pain lasted about a week ago associated some nausea and vomiting occasional diarrhea she has hemorrhoids and occasionally have had some bright red blood.  When she went to the bathroom today she had a severe increase in abdominal pain which almost made a pass out That caused her family to bring to emergency department Where she was found to be severely hypotensive and tachycardic.  Originally diagnosed with pancreatic cancer in [place] about a year ago has undergone laparotomy and Whipple procedure in March 2021 but could not complete secondary to metastatic disease and instead was given GJ bypass. Patient moved to [facility] Currently getting palliative chemotherapy (Gemcitabine every 2 weeks and  Retacrit.)  Recently diagnosed with UTI and started on Septra  Reports some cough  Infectious risk factors:  Reports   shortness of breath,   chest pain, S N/V/Diarrhea/abdominal pain,    severe fatigue   Has been vaccinated against COVID and boosted   Initial COVID TEST  NEGATIVE  PLAN: to return to home with husband.  May need pallative care and home hospice. Progression: o2 at 2l/min via [place],iv zosyn and vancocin, wbc-25.9, total bili=1.6, will follow for changes. Expected Discharge Plan: Home w Hospice Care Barriers to Discharge: No Barriers Identified   Patient Goals and CMS Choice Patient states their goals for this hospitalization and ongoing recovery are:: i would like to go home CMS Medicare.gov Compare Post Acute  Care list provided to:: Patient Choice offered to / list presented to : Patient  Expected Discharge Plan and Services Expected Discharge Plan: [place]   Discharge Planning Services: CM Consult   Living arrangements for the past 2 months: Single Family Home                                      Prior Living Arrangements/Services Living arrangements for the past 2 months: Single Family Home Lives with:: Spouse Patient language and need for interpreter reviewed:: Yes Do you feel safe going back to the place where you live?: Yes      Need for Family Participation in Patient Care: Yes (Comment) Care giver support system in place?: Yes (comment)   Criminal Activity/Legal Involvement Pertinent to Current Situation/Hospitalization: No - Comment as needed  Activities of Daily Living Home Assistive Devices/Equipment: Eyeglasses,Walker (specify type) ADL Screening (condition at time of admission) Patient's cognitive ability adequate to safely complete daily activities?: Yes Is the patient deaf or have difficulty hearing?: No Does the patient have difficulty seeing, even when wearing glasses/contacts?: Yes Does the patient have difficulty concentrating, remembering, or making decisions?: No Patient able to express need for assistance with ADLs?: Yes Does the patient have difficulty dressing or bathing?: No Independently performs ADLs?: Yes (appropriate for developmental age) Does the patient have difficulty walking or climbing stairs?: Yes Weakness of Legs: Both Weakness of Arms/Hands: None  Permission Sought/Granted  Emotional Assessment Appearance:: Appears stated age Attitude/Demeanor/Rapport: Engaged Affect (typically observed): Calm Orientation: : Oriented to Place,Oriented to Self,Oriented to  Time,Oriented to Situation Alcohol / Substance Use: Not Applicable Psych Involvement: No (comment)  Admission diagnosis:  Lactic acidosis  [E87.2] Hypoglycemia [E16.2] Elevated INR [R79.1] Near syncope [R55] Septic shock ([facility]) [A41.9, R65.21] Sepsis ([place]) [A41.9] Abdominal pain, unspecified abdominal location [R10.9] Liver failure without hepatic coma, unspecified chronicity ([place]) [K72.90] Patient Active Problem List   Diagnosis Date Noted  . SBP (spontaneous bacterial peritonitis) ([place]) 05/30/2020  . Hypoglycemia without diagnosis of diabetes mellitus 05/30/2020  . Ascites 05/29/2020  . Sepsis ([place]) 05/29/2020  . Acute lower UTI 05/29/2020  . Elevated LFTs 05/29/2020  . Thrombocytopenia ([place]) 05/29/2020  . Lactic acidosis   . Port-A-Cath in place 12/16/2019  . Goals of care, counseling/discussion 11/30/2019  . Hypertension 11/30/2019  . DM (diabetes mellitus), type 2 ([place]) 11/30/2019  . Pancreatic cancer ([place]) 11/21/2019   PCP:  Isaac Bliss, Rayford Halsted, MD Pharmacy:   [id number] [address], [place] [address] Phone: [phone] Fax: [phone]     Social Determinants of Health (SDOH) Interventions    Readmission Risk Interventions No flowsheet data found.

## 2020-05-30 NOTE — Evaluation (Signed)
Occupational Therapy Evaluation Patient Details Name: Tamara Wood MRN: [id number] DOB: 09-04-1945 Today's Date: 05/30/2020    History of Present Illness Patient is a 74 year old  female with PMH of Pancreatic cancer, DM2, HTN, admitted with abdominal pain and increased fatigue. Patient diagnosed with severe sepsis, PNA and spontaneous bacterial peritonitis   Clinical Impression   Patient lives at home with family, per DTR in law someone is always home with her either patient's DTR, spouse or son. At baseline patient requires assist with LB ADLs "she has trouble bending" per DTR in law and has been using walker more due to LE/foot swelling which has increased in past 3 weeks. Currently patient min A x1-2 for safety with dizziness/orthostatic in standing with systolic reading 75 once seated in chair. BP taken again once reclined in chair with systolic reading in high 94T. Recommend continued acute OT services to maximize patient strength, activity tolerance to reduce caregiver burden and facilitate D/C to venue listed below.    Follow Up Recommendations  Home health OT;Supervision/Assistance - 24 hour    Equipment Recommendations  None recommended by OT       Precautions / Restrictions Precautions Precautions: Fall Precaution Comments: monitor BP Restrictions Weight Bearing Restrictions: No      Mobility Bed Mobility Overal bed mobility: Needs Assistance Bed Mobility: Supine to Sit     Supine to sit: Min assist;HOB elevated     General bed mobility comments: for LE management    Transfers Overall transfer level: Needs assistance Equipment used: Rolling walker (2 wheeled) Transfers: Sit to/from [facility] Sit to Stand: Min assist;+2 safety/equipment;From elevated surface Stand pivot transfers: Min assist;+2 safety/equipment       General transfer comment: please see toilet transfer in ADL section    Balance Overall balance assessment: Needs  assistance Sitting-balance support: Feet supported Sitting balance-Leahy Scale: Fair     Standing balance support: Bilateral upper extremity supported Standing balance-Leahy Scale: Poor Standing balance comment: reliant on B UE support and external assistance                           ADL either performed or assessed with clinical judgement   ADL Overall ADL's : Needs assistance/impaired     Grooming: Set up;Sitting   Upper Body Bathing: Supervision/ safety;Sitting   Lower Body Bathing: Moderate assistance;Sitting/lateral leans;Sit to/from stand   Upper Body Dressing : Supervision/safety;Sitting   Lower Body Dressing: Total assistance;Sitting/lateral leans;Sit to/from stand Lower Body Dressing Details (indicate cue type and reason): to don socks, patient's DTR in law reports she has trouble bending at baseline and has assist with LB dressing from family Toilet Transfer: Minimal assistance;+2 for safety/equipment;Stand-pivot;BSC;RW Toilet Transfer Details (indicate cue type and reason): patient became dizzy in standing therefore deferred further ambulation and transferred to recliner chair. Toileting- Clothing Manipulation and Hygiene: Maximal assistance;Sit to/from stand Toileting - Clothing Manipulation Details (indicate cue type and reason): d/t decreased standing tolerance/dizziness     Functional mobility during ADLs: Minimal assistance;+2 for safety/equipment;Rolling walker General ADL Comments: patient requiring increased assistance with self care due to decreased strength, activity tolerance, balance                  Pertinent Vitals/Pain Pain Assessment: No/denies pain     Hand Dominance Right   Extremity/Trunk Assessment Upper Extremity Assessment Upper Extremity Assessment: Generalized weakness   Lower Extremity Assessment Lower Extremity Assessment: Defer to PT evaluation  Communication Communication Communication: Prefers language  other than English   Cognition Arousal/Alertness: Awake/alert Behavior During Therapy: WFL for tasks assessed/performed Overall Cognitive Status: Within Functional Limits for tasks assessed                                     General Comments  patient orthostatic in standing, please see PT note for vitals            Home Living Family/patient expects to be discharged to:: Private residence Living Arrangements: Children (daughter) Available Help at Discharge: Family;Available 24 hours/day Type of Home: House Home Access: Other (comment) (threshold step)     Home Layout: One level     Bathroom Shower/Tub: Teacher, early years/pre: Standard     Home Equipment: Bedside commode;Cane - single point;Walker - 2 wheels;Shower seat;Grab bars - tub/shower          Prior Functioning/Environment Level of Independence: Needs assistance  Gait / Transfers Assistance Needed: ambulating with walker ADL's / Homemaking Assistance Needed: has assistance in/out of shower but can bath herself, needs ocassional assist with dressing            OT Problem List: Decreased strength;Decreased activity tolerance;Impaired balance (sitting and/or standing);Decreased safety awareness      OT Treatment/Interventions: Self-care/ADL training;Therapeutic exercise;Therapeutic activities;DME and/or AE instruction;Patient/family education;Balance training    OT Goals(Current goals can be found in the care plan section) Acute Rehab OT Goals Patient Stated Goal: "she walked more than this before her feet got swollen" OT Goal Formulation: With patient/family Time For Goal Achievement: 06/13/20 Potential to Achieve Goals: Good  OT Frequency: Min 2X/week           Co-evaluation PT/OT/SLP Co-Evaluation/Treatment: Yes Reason for Co-Treatment: For patient/therapist safety;To address functional/ADL transfers   OT goals addressed during session: ADL's and self-care       AM-PAC OT "6 Clicks" Daily Activity     Outcome Measure Help from another person eating meals?: None Help from another person taking care of personal grooming?: A Little Help from another person toileting, which includes using toliet, bedpan, or urinal?: A Lot Help from another person bathing (including washing, rinsing, drying)?: A Lot Help from another person to put on and taking off regular upper body clothing?: A Little Help from another person to put on and taking off regular lower body clothing?: Total 6 Click Score: 15   End of Session Equipment Utilized During Treatment: Rolling walker;Oxygen Nurse Communication: Mobility status  Activity Tolerance: Treatment limited secondary to medical complications (Comment) (orthostatic in standing) Patient left: in chair;with call bell/phone within reach;with family/visitor present  OT Visit Diagnosis: Unsteadiness on feet (R26.81);Other abnormalities of gait and mobility (R26.89);Muscle weakness (generalized) (M62.81)                Time: 7782-4235 OT Time Calculation (min): 25 min Charges:  OT General Charges $OT Visit: 1 Visit OT Evaluation $OT Eval Low Complexity: [place] OT OT pager: [place] 05/30/2020, 11:35 AM

## 2020-05-30 NOTE — Progress Notes (Signed)
Inpatient Diabetes Program Recommendations  AACE/ADA: New Consensus Statement on Inpatient Glycemic Control (2015)  Target Ranges:  Prepandial:   less than 140 mg/dL      Peak postprandial:   less than 180 mg/dL (1-2 hours)      Critically ill patients:  140 - 180 mg/dL   Results for Tamara Wood, Tamara Wood (MRN [id number]) as of 05/30/2020 07:34  Ref. Range 05/29/2020 16:29 05/29/2020 17:50 05/29/2020 20:30 05/29/2020 22:06 05/30/2020 00:25 05/30/2020 03:01 05/30/2020 03:05 05/30/2020 04:08 05/30/2020 07:26  Glucose-Capillary Latest Ref Range: 70 - 99 mg/dL 36 (LL) 97 95 82 79 64 (L) 76 76 77    Admit with: Severe sepsis most likely secondary to SBP  History: DM, Pancreatic Cancer  Home DM Meds: Metformin 1000 mg Daily  Current Orders: Novolog Sensitive Correction Scale/ SSI (0-9 units) Q4 hours    Issues with Severe Hypoglycemia last PM  CBGs seem to have stabilized  No Insulin given since admission  Will follow   --Will follow patient during hospitalization--  Wyn Quaker RN, MSN, CDE Diabetes Coordinator Inpatient Glycemic Control Team Team Pager: [phone] (8a-5p)

## 2020-05-30 NOTE — Progress Notes (Signed)
Subjective: Says her abdominal pain is improving and better today.  Denies N/V.  ROS: See above, otherwise other systems negative  Objective: Vital signs in last 24 hours: Temp:  [97.4 F (36.3 C)-97.8 F (36.6 C)] 97.4 F (36.3 C) (12/14 0413) Pulse Rate:  [91-125] 93 (12/14 0700) Resp:  [12-30] 14 (12/14 0700) BP: (87-165)/(49-133) 106/61 (12/14 0700) SpO2:  [95 %-100 %] 97 % (12/14 0700) Weight:  [56.2 kg-67.9 kg] 67.9 kg (12/14 0127)    Intake/Output from previous day: 12/13 0701 - 12/14 0700 In: 897.9 [I.V.:256.4; IV Piggyback:641.5] Out: 150 [Urine:150] Intake/Output this shift: No intake/output data recorded.  PE: Gen: stable, NAD Heart: regular Lungs: still with crackles at bases  Abd: soft, less tender today, no peritonitis, +BS, distended still secondary to ascites  Lab Results:  Recent Labs    05/29/20 1013 05/30/20 0607  WBC 26.2* 25.9*  HGB 10.2* 8.5*  HCT 29.6* 24.5*  PLT 71* 87*   BMET Recent Labs    05/29/20 1013 05/30/20 0607  NA 136 137  K 3.9 4.5  CL 106 108  CO2 19* 20*  GLUCOSE 59* 86  BUN 14 14  CREATININE 0.89 0.95  CALCIUM 7.5* 7.0*   PT/INR Recent Labs    05/29/20 1013  LABPROT 26.4*  INR 2.5*   CMP     Component Value Date/Time   NA 137 05/30/2020 0607   K 4.5 05/30/2020 0607   CL 108 05/30/2020 0607   CO2 20 (L) 05/30/2020 0607   GLUCOSE 86 05/30/2020 0607   BUN 14 05/30/2020 0607   CREATININE 0.95 05/30/2020 0607   CREATININE 0.73 05/18/2020 1112   CALCIUM 7.0 (L) 05/30/2020 0607   PROT 4.3 (L) 05/30/2020 0607   ALBUMIN 1.4 (L) 05/30/2020 0607   AST 73 (H) 05/30/2020 0607   AST 70 (H) 05/18/2020 1112   ALT 29 05/30/2020 0607   ALT 34 05/18/2020 1112   ALKPHOS 281 (H) 05/30/2020 0607   BILITOT 1.6 (H) 05/30/2020 0607   BILITOT 1.0 05/18/2020 1112   GFRNONAA >60 05/30/2020 0607   GFRNONAA >60 05/18/2020 1112   GFRAA >60 03/09/2020 0828   Lipase  No results found for:  LIPASE     Studies/Results: CT Angio Chest PE W and/or Wo Contrast  Result Date: 05/29/2020 CLINICAL DATA:  Syncopal episode, acute abdominal pain, weakness, history of pancreatic cancer on chemotherapy EXAM: CT ANGIOGRAPHY CHEST CT ABDOMEN AND PELVIS WITH CONTRAST TECHNIQUE: Multidetector CT imaging of the chest was performed using the standard protocol during bolus administration of intravenous contrast. Multiplanar CT image reconstructions and MIPs were obtained to evaluate the vascular anatomy. Multidetector CT imaging of the abdomen and pelvis was performed using the standard protocol during bolus administration of intravenous contrast. CONTRAST:  145mL OMNIPAQUE IOHEXOL 350 MG/ML SOLN IV. No oral contrast. COMPARISON:  CT abdomen and pelvis 03/06/2020, CT chest 11/29/2019 FINDINGS: CTA CHEST FINDINGS Cardiovascular: Atherosclerotic calcifications aorta and coronary arteries. RIGHT jugular Port-A-Cath with tip in RIGHT atrium. Aorta normal caliber without aneurysm or dissection. Heart normal size. No pericardial effusion. Pulmonary arteries adequately opacified and grossly patent. No evidence of pulmonary embolism. Mediastinum/Nodes: Soft is unremarkable. Base of cervical region normal appearance. No thoracic adenopathy. Lungs/Pleura: Moderate pleural effusions bilaterally. Compressive atelectasis of BILATERAL lower lobes and posterior upper lobes minimal infiltrate in upper lobes. No pneumothorax. No definite pulmonary mass/nodule. Musculoskeletal: Diffuse osseous demineralization. No discrete bone lesion. Review of the MIP images confirms the above findings. CT ABDOMEN  and PELVIS FINDINGS Hepatobiliary: Marked hepatic steatosis. Gallbladder surgically absent. CBD stent. No hepatic mass or biliary dilatation. Small nodule anterior to the LEFT lobe of the liver on the previous exam is poorly demonstrated on the current study, subjectively smaller but difficult to accurately measure due to ascites.  Pancreas: Mass at pancreatic head/uncinate 4.0 x 4.1 cm, appears slightly smaller. Mild pancreatic ductal dilatation at body and proximal tail. No additional mass. 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 Adrenal glands otherwise unremarkable. Small BILATERAL renal cysts. No hydronephrosis, hydroureter, or urinary tract calcification. Bladder decompressed. Stomach/Bowel: Scattered bowel wall thickening, mild in degree, may be related to ascites. Diverticulosis of descending and sigmoid colon without definite wall thickening to suggest diverticulitis. Appendix surgically absent by history. Prior gastric bypass surgery. Vascular/Lymphatic: Extensive atherosclerotic calcifications aorta and iliac arteries without aneurysm. No definite adenopathy. Reproductive: Atrophic uterus with unremarkable ovaries Other: Significant ascites. No definite peritoneal based mass. Scattered subcutaneous edema. No free air. No hernia. Musculoskeletal: Osseous demineralization. Review of the MIP images confirms the above findings. IMPRESSION: No evidence of pulmonary embolism. BILATERAL pleural effusions with compressive atelectasis of the lower lobes and posterior upper lobes. Slight decrease in size of pancreatic head mass. Marked hepatic steatosis. Slight decrease in size of nodule at the anterior surface LEFT lobe liver. CBD stent without biliary dilatation. Scattered bowel wall thickening, may be related to ascites, unable to exclude enteritis. Distal colonic diverticulosis without evidence of diverticulitis. LEFT adrenal myelolipoma. Aortic Atherosclerosis (ICD10-I70.0). Electronically Signed   By: Lavonia Dana M.D.   On: 05/29/2020 15:00   CT ABDOMEN PELVIS W CONTRAST  Result Date: 05/29/2020 CLINICAL DATA:  Syncopal episode, acute abdominal pain, weakness, history of pancreatic cancer on chemotherapy EXAM: CT ANGIOGRAPHY CHEST CT ABDOMEN AND  PELVIS WITH CONTRAST TECHNIQUE: Multidetector CT imaging of the chest was performed using the standard protocol during bolus administration of intravenous contrast. Multiplanar CT image reconstructions and MIPs were obtained to evaluate the vascular anatomy. Multidetector CT imaging of the abdomen and pelvis was performed using the standard protocol during bolus administration of intravenous contrast. CONTRAST:  173mL OMNIPAQUE IOHEXOL 350 MG/ML SOLN IV. No oral contrast. COMPARISON:  CT abdomen and pelvis 03/06/2020, CT chest 11/29/2019 FINDINGS: CTA CHEST FINDINGS Cardiovascular: Atherosclerotic calcifications aorta and coronary arteries. RIGHT jugular Port-A-Cath with tip in RIGHT atrium. Aorta normal caliber without aneurysm or dissection. Heart normal size. No pericardial effusion. Pulmonary arteries adequately opacified and grossly patent. No evidence of pulmonary embolism. Mediastinum/Nodes: Soft is unremarkable. Base of cervical region normal appearance. No thoracic adenopathy. Lungs/Pleura: Moderate pleural effusions bilaterally. Compressive atelectasis of BILATERAL lower lobes and posterior upper lobes minimal infiltrate in upper lobes. No pneumothorax. No definite pulmonary mass/nodule. Musculoskeletal: Diffuse osseous demineralization. No discrete bone lesion. Review of the MIP images confirms the above findings. CT ABDOMEN and PELVIS FINDINGS Hepatobiliary: Marked hepatic steatosis. Gallbladder surgically absent. CBD stent. No hepatic mass or biliary dilatation. Small nodule anterior to the LEFT lobe of the liver on the previous exam is poorly demonstrated on the current study, subjectively smaller but difficult to accurately measure due to ascites. Pancreas: Mass at pancreatic head/uncinate 4.0 x 4.1 cm, appears slightly smaller. Mild pancreatic ductal dilatation at body and proximal tail. No additional mass. Spleen: Normal appearance Adrenals/Urinary Tract: LEFT adrenal fat containing nodule 16 x 12  mm consistent with myelolipoma. Adrenal glands otherwise unremarkable. Small BILATERAL renal cysts. No hydronephrosis, hydroureter, or urinary tract calcification. Bladder decompressed.  Stomach/Bowel: Scattered bowel wall thickening, mild in degree, may be related to ascites. Diverticulosis of descending and sigmoid colon without definite wall thickening to suggest diverticulitis. Appendix surgically absent by history. Prior gastric bypass surgery. Vascular/Lymphatic: Extensive atherosclerotic calcifications aorta and iliac arteries without aneurysm. No definite adenopathy. Reproductive: Atrophic uterus with unremarkable ovaries Other: Significant ascites. No definite peritoneal based mass. Scattered subcutaneous edema. No free air. No hernia. Musculoskeletal: Osseous demineralization. Review of the MIP images confirms the above findings. IMPRESSION: No evidence of pulmonary embolism. BILATERAL pleural effusions with compressive atelectasis of the lower lobes and posterior upper lobes. Slight decrease in size of pancreatic head mass. Marked hepatic steatosis. Slight decrease in size of nodule at the anterior surface LEFT lobe liver. CBD stent without biliary dilatation. Scattered bowel wall thickening, may be related to ascites, unable to exclude enteritis. Distal colonic diverticulosis without evidence of diverticulitis. LEFT adrenal myelolipoma. Aortic Atherosclerosis (ICD10-I70.0). Electronically Signed   By: Lavonia Dana M.D.   On: 05/29/2020 15:00   DG Chest Port 1 View  Result Date: 05/29/2020 CLINICAL DATA:  Evaluate for sepsis. Shortness of breath and cough. History of pancreas cancer. EXAM: PORTABLE CHEST 1 VIEW COMPARISON:  CT chest 11/29/2019 FINDINGS: Right chest wall port a catheter identified with tip at the cavoatrial junction. Normal heart size. A small left pleural effusion is suspected. Additionally, there are patchy airspace densities within the mid and lower left lung zone. Right lung appears  clear. IMPRESSION: Left mid and lower lung zone airspace densities compatible with pneumonia. Suspect small left pleural effusion. Electronically Signed   By: Kerby Moors M.D.   On: 05/29/2020 10:34    Anti-infectives: Anti-infectives (From admission, onward)   Start     Dose/Rate Route Frequency Ordered Stop   05/30/20 1600  vancomycin (VANCOCIN) IVPB 1000 mg/200 mL premix        1,000 mg 200 mL/hr over 60 Minutes Intravenous Every 24 hours 05/29/20 2021     05/29/20 2200  piperacillin-tazobactam (ZOSYN) IVPB 3.375 g        3.375 g 12.5 mL/hr over 240 Minutes Intravenous Every 8 hours 05/29/20 2026     05/29/20 1600  vancomycin (VANCOREADY) IVPB 1250 mg/250 mL        1,250 mg 166.7 mL/hr over 90 Minutes Intravenous  Once 05/29/20 1551 05/29/20 2057   05/29/20 1545  piperacillin-tazobactam (ZOSYN) IVPB 3.375 g        3.375 g 100 mL/hr over 30 Minutes Intravenous  Once 05/29/20 1533 05/29/20 1635   05/29/20 1230  azithromycin (ZITHROMAX) 500 mg in sodium chloride 0.9 % 250 mL IVPB        500 mg 250 mL/hr over 60 Minutes Intravenous  Once 05/29/20 1228 05/29/20 1555   05/29/20 1015  cefTRIAXone (ROCEPHIN) 1 g in sodium chloride 0.9 % 100 mL IVPB  Status:  Discontinued        1 g 200 mL/hr over 30 Minutes Intravenous Every 24 hours 05/29/20 1014 05/29/20 2026       Assessment/Plan HTN DM Hypoalbuminemia Thrombocytopenia Hypoglycemia Elevated LFTs supratherapeutic INR - not on anticoagulants Stage III pancreatic cancer - CA 19-9 relatively stable, slightly up to 180.  onc has seen the patient. [employer] - does not accept blood products Sepsis - Leukocytosis down slightly.  Lactic acid slowly clearing.  Down to 2.7 today B pleural effusions - could consider thoracenteses to help with SOB and cough.  Ascites - defer to medicine on para results, but also consider a therapeutic  paracentesis by IR to remove her fluid which may help with her abdominal pain given she has a  fair amount of ascites present.  Same with pleural effusions.  Abdominal pain -no evidence of bowel ischemia -exam improved today -no needs for surgical intervention.  Will defer care to medicine team/onc, etc -we will be available as needed.  FEN - NPO/IVFs VTE - rec holding due to elevated INR and low plts ID - per medicine   LOS: 1 day    Henreitta Cea , [facility] 05/30/2020, 8:09 AM Please see Amion for pager number during day hours 7:00am-4:30pm or 7:00am -11:30am on weekends

## 2020-05-30 NOTE — Progress Notes (Signed)
Tamara Wood   DOB:[birth year]   BM#:[id number]   UUV#:[id number]  Oncology follow up   Subjective: Continues to report abdominal pain today.  Slight improvement today.  Denies nausea and vomiting.  Reports swelling in her arms and legs.  Still reporting abdominal distention.  Objective:  Vitals:   05/30/20 0900 05/30/20 1100  BP:    Pulse:    Resp:    Temp: (!) 97.3 F (36.3 C) (!) 97.3 F (36.3 C)  SpO2:      Body mass index is 25.69 kg/m.  Intake/Output Summary (Last 24 hours) at 05/30/2020 1414 Last data filed at 05/30/2020 0700 Gross per 24 hour  Intake 897.9 ml  Output 150 ml  Net 747.9 ml     Sclerae unicteric  Oropharynx clear  No peripheral adenopathy  Lungs clear -- no rales or rhonchi  Heart regular rate and rhythm  Abdomen soft, distended, with diffuse mild tenderness, and (+) ascites   MSK no focal spinal tenderness, anasarca noted  Neuro nonfocal    CBG (last 3)  Recent Labs    05/30/20 0408 05/30/20 0726 05/30/20 1200  GLUCAP 76 77 129*     Labs:  Urine Studies No results for input(s): UHGB, CRYS in the last 72 hours.  Invalid input(s): UACOL, UAPR, USPG, UPH, UTP, UGL, UKET, UBIL, UNIT, UROB, ULEU, UEPI, UWBC, URBC, UBAC, CAST, UCOM, BILUA  Basic Metabolic Panel: Recent Labs  Lab 05/29/20 1013 05/30/20 0607  NA 136 137  K 3.9 4.5  CL 106 108  CO2 19* 20*  GLUCOSE 59* 86  BUN 14 14  CREATININE 0.89 0.95  CALCIUM 7.5* 7.0*  MG  --  1.1*  PHOS  --  3.2   GFR Estimated Creatinine Clearance: 48.5 mL/min (by C-G formula based on SCr of 0.95 mg/dL). Liver Function Tests: Recent Labs  Lab 05/29/20 1013 05/30/20 0607  AST 85* 73*  ALT 31 29  ALKPHOS 373* 281*  BILITOT 1.7* 1.6*  PROT 5.1* 4.3*  ALBUMIN 1.7* 1.4*   No results for input(s): LIPASE, AMYLASE in the last 168 hours. Recent Labs  Lab 05/29/20 2030  AMMONIA 36*   Coagulation profile Recent Labs  Lab 05/29/20 1013  INR 2.5*    CBC: Recent Labs  Lab  05/29/20 1013 05/30/20 0607  WBC 26.2* 25.9*  NEUTROABS 22.5* 22.5*  HGB 10.2* 8.5*  HCT 29.6* 24.5*  MCV 99.7 101.2*  PLT 71* 87*   Cardiac Enzymes: No results for input(s): CKTOTAL, CKMB, CKMBINDEX, TROPONINI in the last 168 hours. BNP: Invalid input(s): POCBNP CBG: Recent Labs  Lab 05/30/20 0301 05/30/20 0305 05/30/20 0408 05/30/20 0726 05/30/20 1200  GLUCAP 64* 76 76 77 129*   D-Dimer No results for input(s): DDIMER in the last 72 hours. Hgb A1c Recent Labs    05/30/20 0607  HGBA1C 6.0*   Lipid Profile No results for input(s): CHOL, HDL, LDLCALC, TRIG, CHOLHDL, LDLDIRECT in the last 72 hours. Thyroid function studies Recent Labs    05/30/20 0607  TSH 1.664   Anemia work up Recent Labs    05/30/20 0600  VITAMINB12 >7,500*  FOLATE 7.3   Microbiology Recent Results (from the past 240 hour(s))  Resp Panel by RT-PCR (Flu A&B, Covid) Nasopharyngeal Swab     Status: None   Collection Time: 05/29/20 11:17 AM   Specimen: Nasopharyngeal Swab; Nasopharyngeal(NP) swabs in vial transport medium  Result Value Ref Range Status   SARS Coronavirus 2 by RT PCR NEGATIVE NEGATIVE Final    Comment: (  NOTE) SARS-CoV-2 target nucleic acids are NOT DETECTED.  The SARS-CoV-2 RNA is generally detectable in upper respiratory specimens during the acute phase of infection. The lowest concentration of SARS-CoV-2 viral copies this assay can detect is 138 copies/mL. A negative result does not preclude SARS-Cov-2 infection and should not be used as the sole basis for treatment or other patient management decisions. A negative result may occur with  improper specimen collection/handling, submission of specimen other than nasopharyngeal swab, presence of viral mutation(s) within the areas targeted by this assay, and inadequate number of viral copies(<138 copies/mL). A negative result must be combined with clinical observations, patient history, and epidemiological information. The  expected result is Negative.  Fact Sheet for Patients:  EntrepreneurPulse.com.au  Fact Sheet for Healthcare Providers:  IncredibleEmployment.be  This test is no t yet approved or cleared by the [place] FDA and  has been authorized for detection and/or diagnosis of SARS-CoV-2 by FDA under an Emergency Use Authorization (EUA). This EUA will remain  in effect (meaning this test can be used) for the duration of the COVID-19 declaration under Section 564(b)(1) of the Act, 21 U.S.C.section 360bbb-3(b)(1), unless the authorization is terminated  or revoked sooner.       Influenza A by PCR NEGATIVE NEGATIVE Final   Influenza B by PCR NEGATIVE NEGATIVE Final    Comment: (NOTE) The Xpert Xpress SARS-CoV-2/FLU/RSV plus assay is intended as an aid in the diagnosis of influenza from Nasopharyngeal swab specimens and should not be used as a sole basis for treatment. Nasal washings and aspirates are unacceptable for Xpert Xpress SARS-CoV-2/FLU/RSV testing.  Fact Sheet for Patients: EntrepreneurPulse.com.au  Fact Sheet for Healthcare Providers: IncredibleEmployment.be  This test is not yet approved or cleared by the [place] FDA and has been authorized for detection and/or diagnosis of SARS-CoV-2 by FDA under an Emergency Use Authorization (EUA). This EUA will remain in effect (meaning this test can be used) for the duration of the COVID-19 declaration under Section 564(b)(1) of the Act, 21 U.S.C. section 360bbb-3(b)(1), unless the authorization is terminated or revoked.  Performed at [facility], [place] [address], [place], [place] [address]   Body fluid culture (includes gram stain)     Status: None (Preliminary result)   Collection Time: 05/29/20  4:34 PM   Specimen: Peritoneal Washings; Peritoneal Fluid  Result Value Ref Range Status   Specimen Description   Final    PERITONEAL  FLUID Performed at [facility], [place] [address], [place], [place] [address]    Special Requests   Final    NONE Performed at [facility], [place] [address], [place], [place] [address]    Gram Stain   Final    FEW WBC PRESENT, PREDOMINANTLY MONONUCLEAR NO ORGANISMS SEEN    Culture   Final    NO GROWTH < 24 HOURS Performed at [place] [address] [place], [place] [address]    Report Status PENDING  Incomplete  MRSA PCR Screening     Status: None   Collection Time: 05/30/20  1:26 AM   Specimen: Nasal Mucosa; Nasopharyngeal  Result Value Ref Range Status   MRSA by PCR NEGATIVE NEGATIVE Final    Comment:        The GeneXpert MRSA Assay (FDA approved for NASAL specimens only), is one component of a comprehensive MRSA colonization surveillance program. It is not intended to diagnose MRSA infection nor to guide or monitor treatment for MRSA infections. Performed at  [facility], [place] [address] [place], [place] [address]       Studies:  CT Angio Chest PE W and/or Wo Contrast  Result Date: 05/29/2020 CLINICAL DATA:  Syncopal episode, acute abdominal pain, weakness, history of pancreatic cancer on chemotherapy EXAM: CT ANGIOGRAPHY CHEST CT ABDOMEN AND PELVIS WITH CONTRAST TECHNIQUE: Multidetector CT imaging of the chest was performed using the standard protocol during bolus administration of intravenous contrast. Multiplanar CT image reconstructions and MIPs were obtained to evaluate the vascular anatomy. Multidetector CT imaging of the abdomen and pelvis was performed using the standard protocol during bolus administration of intravenous contrast. CONTRAST:  131mL OMNIPAQUE IOHEXOL 350 MG/ML SOLN IV. No oral contrast. COMPARISON:  CT abdomen and pelvis 03/06/2020, CT chest 11/29/2019 FINDINGS: CTA CHEST FINDINGS Cardiovascular: Atherosclerotic calcifications aorta and coronary arteries. RIGHT jugular Port-A-Cath with tip in  RIGHT atrium. Aorta normal caliber without aneurysm or dissection. Heart normal size. No pericardial effusion. Pulmonary arteries adequately opacified and grossly patent. No evidence of pulmonary embolism. Mediastinum/Nodes: Soft is unremarkable. Base of cervical region normal appearance. No thoracic adenopathy. Lungs/Pleura: Moderate pleural effusions bilaterally. Compressive atelectasis of BILATERAL lower lobes and posterior upper lobes minimal infiltrate in upper lobes. No pneumothorax. No definite pulmonary mass/nodule. Musculoskeletal: Diffuse osseous demineralization. No discrete bone lesion. Review of the MIP images confirms the above findings. CT ABDOMEN and PELVIS FINDINGS Hepatobiliary: Marked hepatic steatosis. Gallbladder surgically absent. CBD stent. No hepatic mass or biliary dilatation. Small nodule anterior to the LEFT lobe of the liver on the previous exam is poorly demonstrated on the current study, subjectively smaller but difficult to accurately measure due to ascites. Pancreas: Mass at pancreatic head/uncinate 4.0 x 4.1 cm, appears slightly smaller. Mild pancreatic ductal dilatation at body and proximal tail. No additional mass. 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 Adrenal glands otherwise unremarkable. Small BILATERAL renal cysts. No hydronephrosis, hydroureter, or urinary tract calcification. Bladder decompressed. Stomach/Bowel: Scattered bowel wall thickening, mild in degree, may be related to ascites. Diverticulosis of descending and sigmoid colon without definite wall thickening to suggest diverticulitis. Appendix surgically absent by history. Prior gastric bypass surgery. Vascular/Lymphatic: Extensive atherosclerotic calcifications aorta and iliac arteries without aneurysm. No definite adenopathy. Reproductive: Atrophic uterus with unremarkable ovaries Other: Significant ascites. No definite peritoneal  based mass. Scattered subcutaneous edema. No free air. No hernia. Musculoskeletal: Osseous demineralization. Review of the MIP images confirms the above findings. IMPRESSION: No evidence of pulmonary embolism. BILATERAL pleural effusions with compressive atelectasis of the lower lobes and posterior upper lobes. Slight decrease in size of pancreatic head mass. Marked hepatic steatosis. Slight decrease in size of nodule at the anterior surface LEFT lobe liver. CBD stent without biliary dilatation. Scattered bowel wall thickening, may be related to ascites, unable to exclude enteritis. Distal colonic diverticulosis without evidence of diverticulitis. LEFT adrenal myelolipoma. Aortic Atherosclerosis (ICD10-I70.0). Electronically Signed   By: Lavonia Dana M.D.   On: 05/29/2020 15:00   CT ABDOMEN PELVIS W CONTRAST  Result Date: 05/29/2020 CLINICAL DATA:  Syncopal episode, acute abdominal pain, weakness, history of pancreatic cancer on chemotherapy EXAM: CT ANGIOGRAPHY CHEST CT ABDOMEN AND PELVIS WITH CONTRAST TECHNIQUE: Multidetector CT imaging of the chest was performed using the standard protocol during bolus administration of intravenous contrast. Multiplanar CT image reconstructions and MIPs were obtained to evaluate the vascular anatomy. Multidetector CT imaging of the abdomen and pelvis was performed using the standard protocol during bolus administration of intravenous contrast.  CONTRAST:  163mL OMNIPAQUE IOHEXOL 350 MG/ML SOLN IV. No oral contrast. COMPARISON:  CT abdomen and pelvis 03/06/2020, CT chest 11/29/2019 FINDINGS: CTA CHEST FINDINGS Cardiovascular: Atherosclerotic calcifications aorta and coronary arteries. RIGHT jugular Port-A-Cath with tip in RIGHT atrium. Aorta normal caliber without aneurysm or dissection. Heart normal size. No pericardial effusion. Pulmonary arteries adequately opacified and grossly patent. No evidence of pulmonary embolism. Mediastinum/Nodes: Soft is unremarkable. Base of  cervical region normal appearance. No thoracic adenopathy. Lungs/Pleura: Moderate pleural effusions bilaterally. Compressive atelectasis of BILATERAL lower lobes and posterior upper lobes minimal infiltrate in upper lobes. No pneumothorax. No definite pulmonary mass/nodule. Musculoskeletal: Diffuse osseous demineralization. No discrete bone lesion. Review of the MIP images confirms the above findings. CT ABDOMEN and PELVIS FINDINGS Hepatobiliary: Marked hepatic steatosis. Gallbladder surgically absent. CBD stent. No hepatic mass or biliary dilatation. Small nodule anterior to the LEFT lobe of the liver on the previous exam is poorly demonstrated on the current study, subjectively smaller but difficult to accurately measure due to ascites. Pancreas: Mass at pancreatic head/uncinate 4.0 x 4.1 cm, appears slightly smaller. Mild pancreatic ductal dilatation at body and proximal tail. No additional mass. 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 Adrenal glands otherwise unremarkable. Small BILATERAL renal cysts. No hydronephrosis, hydroureter, or urinary tract calcification. Bladder decompressed. Stomach/Bowel: Scattered bowel wall thickening, mild in degree, may be related to ascites. Diverticulosis of descending and sigmoid colon without definite wall thickening to suggest diverticulitis. Appendix surgically absent by history. Prior gastric bypass surgery. Vascular/Lymphatic: Extensive atherosclerotic calcifications aorta and iliac arteries without aneurysm. No definite adenopathy. Reproductive: Atrophic uterus with unremarkable ovaries Other: Significant ascites. No definite peritoneal based mass. Scattered subcutaneous edema. No free air. No hernia. Musculoskeletal: Osseous demineralization. Review of the MIP images confirms the above findings. IMPRESSION: No evidence of pulmonary embolism. BILATERAL pleural effusions with compressive  atelectasis of the lower lobes and posterior upper lobes. Slight decrease in size of pancreatic head mass. Marked hepatic steatosis. Slight decrease in size of nodule at the anterior surface LEFT lobe liver. CBD stent without biliary dilatation. Scattered bowel wall thickening, may be related to ascites, unable to exclude enteritis. Distal colonic diverticulosis without evidence of diverticulitis. LEFT adrenal myelolipoma. Aortic Atherosclerosis (ICD10-I70.0). Electronically Signed   By: Lavonia Dana M.D.   On: 05/29/2020 15:00   DG Chest Port 1 View  Result Date: 05/29/2020 CLINICAL DATA:  Evaluate for sepsis. Shortness of breath and cough. History of pancreas cancer. EXAM: PORTABLE CHEST 1 VIEW COMPARISON:  CT chest 11/29/2019 FINDINGS: Right chest wall port a catheter identified with tip at the cavoatrial junction. Normal heart size. A small left pleural effusion is suspected. Additionally, there are patchy airspace densities within the mid and lower left lung zone. Right lung appears clear. IMPRESSION: Left mid and lower lung zone airspace densities compatible with pneumonia. Suspect small left pleural effusion. Electronically Signed   By: Kerby Moors M.D.   On: 05/29/2020 10:34    Assessment: 74 y.o. female with unresectable pancreatic cancer, currently on chemotherapy gemcitabine and Abraxane every 2 weeks, presented with abdominal pain, bloating, and profound weakness.  1. Weakness and lactic acidosis, concerning for sepsis 2.  Unresectable pancreatic cancer, currently on palliative chemotherapy with gemcitabine and Abraxane, last dose on 12/2 3.  New onset ascites and bilateral pleural effusion, no extremity edema, anasarca, ruled out malignant ascites  4.  Transaminitis and mild hyperbilirubinemia 5.  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  Leukocytosis, secondary to G-CSF, and possible infection 7.  Severe protein and calorie malnutrition  Plan:  -She  underwent paracentesis in the ED, cytology pending -Agree with antibiotics -I reviewed her CT scan from today, which showed no PE, primary pancreatic cancer is slightly smaller, no definitive new metastasis on scan  -Given the diffuse anasarca, bilateral pleural effusion, ascites, bilateral lower extremity edema, I think her anasarca is likely related to her malnutrition, low albumin.  -continue supportive care   Mikey Bussing, NP 05/30/2020

## 2020-05-30 NOTE — Plan of Care (Signed)
°  Problem: Fluid Volume: °Goal: Hemodynamic stability will improve °Outcome: Progressing °  °Problem: Clinical Measurements: °Goal: Diagnostic test results will improve °Outcome: Progressing °Goal: Signs and symptoms of infection will decrease °Outcome: Progressing °  °

## 2020-05-30 NOTE — ED Notes (Signed)
Called to give report was told to call back in 10 min

## 2020-05-30 NOTE — Progress Notes (Signed)
Blood refusal consent (in spanish) in pt chart.  Pt daughter in law at bedside and translated terms of refusal in [place].  Albumin running at this time. Pt resting comfortably. No signs of distress.

## 2020-05-30 NOTE — Progress Notes (Signed)
Pharmacy Brief Progress Note:  Confirmed with MD that patient has consented to receive albumin blood product.   Lenis Noon, PharmD 05/30/20 10:26 AM

## 2020-05-30 NOTE — Consult Note (Addendum)
Referring Provider:  [facility]         Primary Care Physician:  Tamara Wood, Tamara Halsted, MD Primary Gastroenterologist:    none         We were asked to see this patient for:   SBP               Attending physician's note   I have taken a history, examined the patient and reviewed the chart. I agree with the Advanced Practitioner's note, impression and recommendations.   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 The patient agreed with the plan and demonstrated an understanding of the instructions.  Tamara Wood , MD [phone]    ASSESSMENT / PLAN:   # 74 yo non- English speaking female with DM, HTN, GERD, appendectomy, cholecystectomy, chronic pancreatitis, Stage III ( cT4, cN1, cMO) pancreatic adenocarcinoma diagnosed in [place] Feb 2021. In March 2021 a Whipple surgery was aborted due to tumor imvasion of SMV, she instead had a gastrojejunostomy . Undergoing chemotherapy with Dr. Burr Medico.  # Severe sepsis with PNA and SBP.  Presented with abdominal pain, leukocytosis with left shift, elevated lactic acid ( improving), tachycardia. Imaging showed left lung infiltrate suspicious for PNA. CT scan w/ contrast shows pancreatic head mass to be slightly smaller, scattered mild bowel wall  thickening possibly related to ascites, diverticulosis, CBD without biliary duct dilation, marked steatosis, bilateral pleural effusions.  --Severe hepatic steatosis on CT scan. No known history of cirrhosis. No stigmata of chronic liver disease on exam. Not sure if this malignant ascites or from portal HTN related to undiagnosed liver disease? No fluid albumin level to calculate SAAG.  She is coagulopathic but that could be from prolonged biliary obstruction or malnutrition. Fluid cytology is pending. If malignant ascites it may not be responsive to diuretics.  --Ascitic fluid cultures pending. On Zosyn.  --For SBP generally give albumin 1.5 g / kg on day #1 followed by 1 g / kg on day #3. She is getting 50 grams Q 6 hours x 4 doses so that should suffice.  --After resolution of SBP we are happy to see and try to determine if she has underlying liver disease.   # Macrocytic anemia, on chemotherapy. Jehov[employer]'s Witness, doesn't accept blood products. B12 > 7500     HPI:  Chief Complaint: abdominal pain    Tamara Wood presented to ED yesterday with weakness and acute on chronic abdominal pain, near syncope. In ED she was hypotensive, tachycardic. Her WBC was 26K. Lactic acid was elevated at 5.5. Hgb stable at 10.2, platelets 71, INR 2.5, Alk phos chronically elevated and stable in 300s. Tbili 1.7. Surgery evaluated for ? Bowel ischemia which they felt was unlikely. Diagnostic paracentesis yesterday c/w SBP. No growth on cultures yet. Patient told in the 1980s that she had fatty liver disease.   [name] is present and helps with interpretation / translation. Patient developed generalized abdominal pain last week. Pain was intermittent, sometimes worse with food. No fevers. No nausea / vomiting. She had loose stools a couple of times a day for several days prior to admission  but no BMs since admission. She occasionally has blood on tissue when wiping after a BM but not recently. Her appetite has been okay. She started having lower extremity swelling several days ago. Her abdominal pain has significantly improved. The only time she has abdominal pain now is when providers examine her abdomen.    05/29/20 Ascitic fluid Total nucleated cells 9450, 89% Neut count  PREVIOUS ENDOSCOPIC EVALUATIONS / PERTINENT STUDIES   05/29/20 CT scan w/ contrast  IMPRESSION: No evidence of pulmonary embolism.  BILATERAL pleural effusions with compressive atelectasis of the lower lobes and posterior upper lobes.  Slight decrease in size of pancreatic head mass.  Marked hepatic steatosis.  Slight decrease in size of nodule at the anterior surface LEFT lobe liver.  CBD stent without biliary dilatation.  Scattered bowel wall thickening, may be related to ascites, unable to exclude enteritis.  Distal colonic diverticulosis without evidence of diverticulitis.  LEFT adrenal myelolipoma.  Aortic Atherosclerosis    Past Medical History:  Diagnosis Date  . Diabetes mellitus without complication ([place])   . Hypertension   . Pancreatic cancer [facility])     Past Surgical History:  Procedure Laterality Date  . APPENDECTOMY    . BACK SURGERY    . CHOLECYSTECTOMY    . GASTRIC BYPASS OPEN  08/2019   aborted whipple, GJ bypass for obstruction  . IR GASTROSTOMY TUBE REMOVAL  11/30/2019  . IR IMAGING GUIDED PORT INSERTION  12/08/2019    Prior to Admission medications   Medication Sig Start Date End Date Taking? Authorizing Provider  acetaminophen (TYLENOL) 500 MG tablet Take 500-1,000 mg by mouth daily as needed for moderate pain.   Yes [provider]  amLODipine (NORVASC) 5 MG tablet Take 1 tablet (5 mg total) by mouth daily. 12/22/19  Yes Erline Hau, MD  ASPIRIN LOW DOSE 81 MG EC tablet Take 81 mg by mouth daily. 07/31/19  Yes [provider]  Calcium Carb-Cholecalciferol (CALCIUM/VITAMIN D) 500-200 MG-UNIT TABS Take 1 tablet by mouth in the morning and at bedtime.  11/10/19  Yes [provider]  DM-Doxylamine-Acetaminophen (NYQUIL HBP COLD & FLU) 15-6.25-325 MG/15ML LIQD Take 5 mLs by mouth 2 (two) times daily as needed (cold).   Yes [provider]  furosemide (LASIX) 20 MG tablet Take 0.5 (half) tablet every other day for swelling Patient taking differently: Take 10 mg by mouth every other day. 05/18/20  Yes Alla Feeling, NP  hydrocortisone (ANUSOL-HC) 2.5 % rectal cream Place 1 application rectally 2 (two) times daily. Patient taking differently: Place 1 application rectally 2 (two) times daily as needed for anal itching. 04/06/20  Yes Erline Hau, MD  KLOR-CON M20 20 MEQ tablet TAKE 1 TABLET BY MOUTH EVERY DAY Patient taking differently: Take 20 mEq by mouth daily. 03/09/20  Yes Alla Feeling, NP  lidocaine-prilocaine (EMLA) cream Apply 1 application topically as needed. Patient taking differently: Apply 1 application topically daily as needed (pain). 12/16/19  Yes Alla Feeling, NP  Magnesium 250 MG TABS Take 1 tablet (250 mg total) by mouth daily. 12/07/19  Yes Tamara Wood, Tamara Halsted, MD  metFORMIN (GLUCOPHAGE) 1000 MG tablet TAKE 1 TABLET BY MOUTH TWICE A DAY Patient taking differently: Take 1,000 mg by mouth daily. 03/08/20  Yes Tamara Wood, Tamara Halsted, MD  ondansetron (ZOFRAN) 8 MG tablet TAKE 1 TABLET (8 MG TOTAL) BY MOUTH 2 (TWO) TIMES DAILY AS NEEDED (NAUSEA OR VOMITING). Patient taking differently: Take 8 mg by mouth 2 (two) times daily as needed for nausea or vomiting (Nausea or vomiting). 05/17/20  Yes Truitt Merle, MD  pantoprazole (PROTONIX) 20 MG tablet Take 1 tablet (20 mg total) by mouth daily. 11/22/19  Yes Truitt Merle, MD  prochlorperazine (COMPAZINE) 10 MG tablet Take 1 tablet (10 mg total) by mouth every 6 (six) hours as needed (Nausea or vomiting). Patient taking  differently: Take 10 mg by mouth every 6 (six) hours as needed for nausea or vomiting (Nausea or vomiting). 04/06/20  Yes Truitt Merle, MD  traMADol (ULTRAM) 50 MG tablet Take 0.5-1 tablets (25-50 mg total) by mouth every 6 (six) hours as needed. Patient taking differently: Take 25-50 mg by mouth every 6 (six) hours as needed for moderate pain. 11/22/19  Yes Truitt Merle, MD  vitamin B-12 (CYANOCOBALAMIN) 1000 MCG tablet Take 1 tablet (1,000 mcg total) by mouth daily. 11/22/19  Yes Truitt Merle, MD  ciprofloxacin (CIPRO) 500 MG tablet Take 1 tablet (500 mg total) by mouth 2 (two) times daily. Patient taking differently: Take 500 mg by mouth 2 (two) times daily. Start date : 05/23/20 05/19/20   Alla Feeling, NP  glucose blood test strip 1 each by Other route in the morning and at bedtime. Dx E11.9  For OneTouch Ultra 2 01/28/20   Tamara Wood, Tamara Halsted, MD  HYDROcodone-acetaminophen (NORCO/VICODIN) 5-325 MG tablet Take 1 tablet by mouth every 6 (six) hours as needed. Patient not taking: No sig reported 12/30/19   Truitt Merle, MD  metoprolol tartrate (LOPRESSOR) 50 MG tablet Take 1 tablet (50 mg total) by mouth 2 (two) times daily. 12/07/19   Tamara Wood, Tamara Halsted, MD  OneTouch Delica Lancets 53I MISC Use twice daily for glucose control, Dx E11.9 02/03/20   Tamara Wood, Tamara Halsted, MD    Current Facility-Administered Medications  Medication Dose Route Frequency Provider Last Rate Last Admin  . albumin human 25 % solution 50 g  50 g Intravenous Q6H Adhikari, Amrit, MD 60 mL/hr at 05/30/20 1127 50 g at 05/30/20 1127  . albuterol (PROVENTIL) (2.5 MG/3ML) 0.083% nebulizer solution 2.5 mg  2.5 mg Nebulization Q2H PRN Doutova, Anastassia, MD      . Chlorhexidine Gluconate Cloth 2 % PADS 6 each  6 each Topical Q0600 Toy Baker, MD   6 each at 05/30/20 0503  . [START ON 05/31/2020] furosemide (LASIX) tablet 20 mg  20 mg Oral Daily Adhikari, Amrit, MD      . hydrocortisone (ANUSOL-HC) 2.5 % rectal cream  1 application  1 application Rectal BID Toy Baker, MD   1 application at 14/43/15 0850  . insulin aspart (novoLOG) injection 0-9 Units  0-9 Units Subcutaneous Q4H Doutova,  Anastassia, MD      . lidocaine-EPINEPHrine (XYLOCAINE W/EPI) 2 %-1:200000 (PF) injection 10 mL  10 mL Infiltration Once Doutova, Anastassia, MD      . MEDLINE mouth rinse  15 mL Mouth Rinse BID Doutova, Anastassia, MD   15 mL at 05/30/20 0850  . pantoprazole (PROTONIX) EC tablet 20 mg  20 mg Oral Daily Doutova, Nyoka Lint, MD   20 mg at 05/30/20 0851  . piperacillin-tazobactam (ZOSYN) IVPB 3.375 g  3.375 g Intravenous Q8H Luiz Ochoa, [place]   Stopped at 05/30/20 1122    Allergies as of 05/29/2020 - Review Complete 05/29/2020  Allergen Reaction Noted  . Shellfish allergy Other (See Comments) 05/29/2020    Family History  Problem Relation Age of Onset  . Diabetes Mother   . Diabetes Sister   . Diabetes Brother     Social History   Socioeconomic History  . Marital status: Married    Spouse name: Not on file  . Number of children: 6  . Years of education: Not on file  . Highest education level: Not on file  Occupational History  . Not on file  Tobacco Use  . Smoking status: Never Smoker  . Smokeless tobacco: Never Used  Substance and Sexual Activity  . Alcohol use: Never  . Drug use: Never  . Sexual activity: Not on file  Other Topics Concern  . Not on file  Social History Narrative  . Not on file   Social Determinants of Health   Financial Resource Strain: Not on file  Food Insecurity: Not on file  Transportation Needs: Not on file  Physical Activity: Not on file  Stress: Not on file  Social Connections: Not on file  Intimate Partner Violence: Not on file    Review of Systems: All systems reviewed and negative except where noted in HPI.    OBJECTIVE:    Physical Exam: Vital signs in last 24 hours: Temp:  [97.3 F (36.3 C)-97.8 F (36.6 C)] 97.3 F (36.3 C) (12/14  1100) Pulse Rate:  [91-115] 105 (12/14 0828) Resp:  [12-30] 14 (12/14 0700) BP: (87-128)/(49-73) 126/73 (12/14 0828) SpO2:  [95 %-100 %] 97 % (12/14 0700) Weight:  [67.9 kg] 67.9 kg (12/14 0127)   General:   Alert female in NAD Psych:  Pleasant, cooperative. Normal mood and affect. Eyes:  Pupils equal, sclera clear, no icterus.   Conjunctiva pink. Ears:  Normal auditory acuity. Nose:  No deformity, discharge,  or lesions. Neck:  Supple; no masses Lungs:  Very fine bibasilar crackles.   Heart:  Regular rate and rhythm bilateral lower extremity pitting edema.  Abdomen:  Soft, mildly distended, mild mid upper abdominal tenderness.  BS active,   Rectal:  Deferred  Msk:  Symmetrical without gross deformities. . Neurologic:  Alert and  oriented x4;  grossly normal neurologically. Skin:  Intact without significant lesions or rashes.  Filed Weights   05/29/20 0911 05/30/20 0127  Weight: 56.2 kg 67.9 kg     Scheduled inpatient medications . Chlorhexidine Gluconate Cloth  6 each Topical Q0600  . [START ON 05/31/2020] furosemide  20 mg Oral Daily  . hydrocortisone  1 application Rectal BID  . insulin aspart  0-9 Units Subcutaneous Q4H  . lidocaine-EPINEPHrine  10 mL Infiltration Once  . mouth rinse  15 mL Mouth Rinse BID  . pantoprazole  20 mg Oral Daily      Intake/Output from previous day: 12/13 0701 - 12/14 0700 In: 897.9 [I.V.:256.4; IV Piggyback:641.5] Out:  150 [Urine:150] Intake/Output this shift: No intake/output data recorded.   Lab Results: Recent Labs    05/29/20 1013 05/30/20 0607  WBC 26.2* 25.9*  HGB 10.2* 8.5*  HCT 29.6* 24.5*  PLT 71* 87*   BMET Recent Labs    05/29/20 1013 05/30/20 0607  NA 136 137  K 3.9 4.5  CL 106 108  CO2 19* 20*  GLUCOSE 59* 86  BUN 14 14  CREATININE 0.89 0.95  CALCIUM 7.5* 7.0*   LFT Recent Labs    05/30/20 0607  PROT 4.3*  ALBUMIN 1.4*  AST 73*  ALT 29  ALKPHOS 281*  BILITOT 1.6*   PT/INR Recent Labs     05/29/20 1013  LABPROT 26.4*  INR 2.5*   Hepatitis Panel Recent Labs    05/29/20 2030  HEPBSAG NON REACTIVE  HCVAB NON REACTIVE  HEPAIGM NON REACTIVE  HEPBIGM NON REACTIVE     . CBC Latest Ref Rng & Units 05/30/2020 05/29/2020 05/18/2020  WBC 4.0 - 10.5 K/uL 25.9(H) 26.2(H) 2.6(L)  Hemoglobin 12.0 - 15.0 g/dL 8.5(L) 10.2(L) 8.7(L)  Hematocrit 36.0 - 46.0 % 24.5(L) 29.6(L) 25.6(L)  Platelets 150 - 400 K/uL 87(L) 71(L) 180    . CMP Latest Ref Rng & Units 05/30/2020 05/29/2020 05/18/2020  Glucose 70 - 99 mg/dL 86 59(L) 159(H)  BUN 8 - 23 mg/dL 14 14 8   Creatinine 0.44 - 1.00 mg/dL 0.95 0.89 0.73  Sodium 135 - 145 mmol/L 137 136 135  Potassium 3.5 - 5.1 mmol/L 4.5 3.9 4.2  Chloride 98 - 111 mmol/L 108 106 106  CO2 22 - 32 mmol/L 20(L) 19(L) 21(L)  Calcium 8.9 - 10.3 mg/dL 7.0(L) 7.5(L) 7.7(L)  Total Protein 6.5 - 8.1 g/dL 4.3(L) 5.1(L) 5.4(L)  Total Bilirubin 0.3 - 1.2 mg/dL 1.6(H) 1.7(H) 1.0  Alkaline Phos 38 - 126 U/L 281(H) 373(H) 317(H)  AST 15 - 41 U/L 73(H) 85(H) 70(H)  ALT 0 - 44 U/L 29 31 34   Studies/Results: CT Angio Chest PE W and/or Wo Contrast  Result Date: 05/29/2020 CLINICAL DATA:  Syncopal episode, acute abdominal pain, weakness, history of pancreatic cancer on chemotherapy EXAM: CT ANGIOGRAPHY CHEST CT ABDOMEN AND PELVIS WITH CONTRAST TECHNIQUE: Multidetector CT imaging of the chest was performed using the standard protocol during bolus administration of intravenous contrast. Multiplanar CT image reconstructions and MIPs were obtained to evaluate the vascular anatomy. Multidetector CT imaging of the abdomen and pelvis was performed using the standard protocol during bolus administration of intravenous contrast. CONTRAST:  165m OMNIPAQUE IOHEXOL 350 MG/ML SOLN IV. No oral contrast. COMPARISON:  CT abdomen and pelvis 03/06/2020, CT chest 11/29/2019 FINDINGS: CTA CHEST FINDINGS Cardiovascular: Atherosclerotic calcifications aorta and coronary arteries. RIGHT  jugular Port-A-Cath with tip in RIGHT atrium. Aorta normal caliber without aneurysm or dissection. Heart normal size. No pericardial effusion. Pulmonary arteries adequately opacified and grossly patent. No evidence of pulmonary embolism. Mediastinum/Nodes: Soft is unremarkable. Base of cervical region normal appearance. No thoracic adenopathy. Lungs/Pleura: Moderate pleural effusions bilaterally. Compressive atelectasis of BILATERAL lower lobes and posterior upper lobes minimal infiltrate in upper lobes. No pneumothorax. No definite pulmonary mass/nodule. Musculoskeletal: Diffuse osseous demineralization. No discrete bone lesion. Review of the MIP images confirms the above findings. CT ABDOMEN and PELVIS FINDINGS Hepatobiliary: Marked hepatic steatosis. Gallbladder surgically absent. CBD stent. No hepatic mass or biliary dilatation. Small nodule anterior to the LEFT lobe of the liver on the previous exam is poorly demonstrated on the current study, subjectively smaller but difficult to accurately measure  due to ascites. Pancreas: Mass at pancreatic head/uncinate 4.0 x 4.1 cm, appears slightly smaller. Mild pancreatic ductal dilatation at body and proximal tail. No additional mass. 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 Adrenal glands otherwise unremarkable. Small BILATERAL renal cysts. No hydronephrosis, hydroureter, or urinary tract calcification. Bladder decompressed. Stomach/Bowel: Scattered bowel wall thickening, mild in degree, may be related to ascites. Diverticulosis of descending and sigmoid colon without definite wall thickening to suggest diverticulitis. Appendix surgically absent by history. Prior gastric bypass surgery. Vascular/Lymphatic: Extensive atherosclerotic calcifications aorta and iliac arteries without aneurysm. No definite adenopathy. Reproductive: Atrophic uterus with unremarkable ovaries Other: Significant  ascites. No definite peritoneal based mass. Scattered subcutaneous edema. No free air. No hernia. Musculoskeletal: Osseous demineralization. Review of the MIP images confirms the above findings. IMPRESSION: No evidence of pulmonary embolism. BILATERAL pleural effusions with compressive atelectasis of the lower lobes and posterior upper lobes. Slight decrease in size of pancreatic head mass. Marked hepatic steatosis. Slight decrease in size of nodule at the anterior surface LEFT lobe liver. CBD stent without biliary dilatation. Scattered bowel wall thickening, may be related to ascites, unable to exclude enteritis. Distal colonic diverticulosis without evidence of diverticulitis. LEFT adrenal myelolipoma. Aortic Atherosclerosis (ICD10-I70.0). Electronically Signed   By: Lavonia Dana M.D.   On: 05/29/2020 15:00   CT ABDOMEN PELVIS W CONTRAST  Result Date: 05/29/2020 CLINICAL DATA:  Syncopal episode, acute abdominal pain, weakness, history of pancreatic cancer on chemotherapy EXAM: CT ANGIOGRAPHY CHEST CT ABDOMEN AND PELVIS WITH CONTRAST TECHNIQUE: Multidetector CT imaging of the chest was performed using the standard protocol during bolus administration of intravenous contrast. Multiplanar CT image reconstructions and MIPs were obtained to evaluate the vascular anatomy. Multidetector CT imaging of the abdomen and pelvis was performed using the standard protocol during bolus administration of intravenous contrast. CONTRAST:  142m OMNIPAQUE IOHEXOL 350 MG/ML SOLN IV. No oral contrast. COMPARISON:  CT abdomen and pelvis 03/06/2020, CT chest 11/29/2019 FINDINGS: CTA CHEST FINDINGS Cardiovascular: Atherosclerotic calcifications aorta and coronary arteries. RIGHT jugular Port-A-Cath with tip in RIGHT atrium. Aorta normal caliber without aneurysm or dissection. Heart normal size. No pericardial effusion. Pulmonary arteries adequately opacified and grossly patent. No evidence of pulmonary embolism. Mediastinum/Nodes:  Soft is unremarkable. Base of cervical region normal appearance. No thoracic adenopathy. Lungs/Pleura: Moderate pleural effusions bilaterally. Compressive atelectasis of BILATERAL lower lobes and posterior upper lobes minimal infiltrate in upper lobes. No pneumothorax. No definite pulmonary mass/nodule. Musculoskeletal: Diffuse osseous demineralization. No discrete bone lesion. Review of the MIP images confirms the above findings. CT ABDOMEN and PELVIS FINDINGS Hepatobiliary: Marked hepatic steatosis. Gallbladder surgically absent. CBD stent. No hepatic mass or biliary dilatation. Small nodule anterior to the LEFT lobe of the liver on the previous exam is poorly demonstrated on the current study, subjectively smaller but difficult to accurately measure due to ascites. Pancreas: Mass at pancreatic head/uncinate 4.0 x 4.1 cm, appears slightly smaller. Mild pancreatic ductal dilatation at body and proximal tail. No additional mass. 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 Adrenal glands otherwise unremarkable. Small BILATERAL renal cysts. No hydronephrosis, hydroureter, or urinary tract calcification. Bladder decompressed. Stomach/Bowel: Scattered bowel wall thickening, mild in degree, may be related to ascites. Diverticulosis of descending and sigmoid colon without definite wall thickening to suggest diverticulitis. Appendix surgically absent by history. Prior gastric bypass surgery. Vascular/Lymphatic: Extensive atherosclerotic calcifications aorta and iliac arteries without aneurysm. No definite  adenopathy. Reproductive: Atrophic uterus with unremarkable ovaries Other: Significant ascites. No definite peritoneal based mass. Scattered subcutaneous edema. No free air. No hernia. Musculoskeletal: Osseous demineralization. Review of the MIP images confirms the above findings. IMPRESSION: No evidence of pulmonary embolism. BILATERAL  pleural effusions with compressive atelectasis of the lower lobes and posterior upper lobes. Slight decrease in size of pancreatic head mass. Marked hepatic steatosis. Slight decrease in size of nodule at the anterior surface LEFT lobe liver. CBD stent without biliary dilatation. Scattered bowel wall thickening, may be related to ascites, unable to exclude enteritis. Distal colonic diverticulosis without evidence of diverticulitis. LEFT adrenal myelolipoma. Aortic Atherosclerosis (ICD10-I70.0). Electronically Signed   By: Lavonia Dana M.D.   On: 05/29/2020 15:00   DG Chest Port 1 View  Result Date: 05/29/2020 CLINICAL DATA:  Evaluate for sepsis. Shortness of breath and cough. History of pancreas cancer. EXAM: PORTABLE CHEST 1 VIEW COMPARISON:  CT chest 11/29/2019 FINDINGS: Right chest wall port a catheter identified with tip at the cavoatrial junction. Normal heart size. A small left pleural effusion is suspected. Additionally, there are patchy airspace densities within the mid and lower left lung zone. Right lung appears clear. IMPRESSION: Left mid and lower lung zone airspace densities compatible with pneumonia. Suspect small left pleural effusion. Electronically Signed   By: Kerby Moors M.D.   On: 05/29/2020 10:34    Active Problems:   Pancreatic cancer ([place])   Hypertension   DM (diabetes mellitus), type 2 ([facility])   Ascites   Sepsis ([place])   Acute lower UTI   Elevated LFTs   Thrombocytopenia ([facility])   SBP (spontaneous bacterial peritonitis) ([place])   Hypoglycemia without diagnosis of diabetes mellitus   Pressure injury of skin    Tye Savoy, NP-C @  05/30/2020, 12:20 PM

## 2020-05-30 NOTE — Progress Notes (Addendum)
Initial Nutrition Assessment  DOCUMENTATION CODES:   Not applicable  INTERVENTION:   -1 packet Juven BID, each packet provides 95 calories, 2.5 grams of protein (collagen), and 9.8 grams of carbohydrate (3 grams sugar); also contains 7 grams of L-arginine and L-glutamine, 300 mg vitamin C, 15 mg vitamin E, 1.2 mcg vitamin B-12, 9.5 mg zinc, 200 mg calcium, and 1.5 g  Calcium Beta-hydroxy-Beta-methylbutyrate to support wound healing.   PROSource Plus 30 mL BID, each supplement provides 100 kcals and 15 grams of protein.    MVI with minerals  220 mg of Zinc daily  500 mg of Vitamin C daily   NUTRITION DIAGNOSIS:   Increased nutrient needs related to wound healing,cancer and cancer related treatments,acute illness as evidenced by estimated needs.   GOAL:   Patient will meet greater than or equal to 90% of their needs   MONITOR:   PO intake,Supplement acceptance,Diet advancement,Labs,Weight trends  REASON FOR ASSESSMENT:   Consult Assessment of nutrition requirement/status  ASSESSMENT:   Pt is a 74 y.o. female with PMH of pancreatic cancer, undergoing palliative chemotherapy (last chemo 12/2), T2DM, and HTN. Presented with fatigue and abdominal pain. Abdominal pain has been ongoing for a week PTA associated with N/V/D. Admitted for severe sepsis, PNA, and spontaneous bacterial peritonitis.  Spoke with pt and daughter-in-law during RD visit. Pt is Spanish speaking, daughter-in-law translated conversation. Per pt, her appetite was good PTA. Pt reports eating 3 meals a day, with lunch being the largest meal. Typical intake: B: pancakes, toast and cheese, or scrambled eggs, L: rice and bean dish or spaghetti and salad, D: soup and bread. Pt reports she has protein drinks (Boost, Ensure, etc) at home but often forgets to drink them. Pt reports she is tolerating foods well during this admission and is feeling hungry. Breakfast was noted at bedside of [place] Ice, juice, and beef broth  with 100% consumed.   Pt reports after her [place] bypass in March 2021, this procedure greatly helped with symptoms of nausea and vomiting. Daughter-in-law reports before [place] surgery, that pt was unable to tolerate anything po, not even ice chips, and that the pt was on TPN for 2 months prior to surgery. Before this surgery pt reports a weight loss of 30-40 lbs. After the surgery, pt does not report recent weight loss. Pt reports her UBW is 124 lbs. Per outpatient RD note in August 2021, pt stated UBW was 150 lbs. Daughter-in-law reports that pt has been gaining weight of approximately 1-1.5 lbs a week. Per chart review, pt weight has remained stable since August with recent weight gain since November. Of note, pt currently with severe BLE edema.   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 Per pt report, wound is currently healing well at this time but wound has been in variable stages of healing in the past.  Pt reports at home she is able to ambulate without any problems until recently d/t increased weakness. She has been using a walker over the past few weeks but during the past few days, pt is unable to ambulate at all.    Labs reviewed: CBG's x 24 hours: 64-82, Magnesium 1.1  Medications reviewed and include: Protonix, SSI- not yet needed, IV magnesium sulfate, NS at 75 ml/hr   NUTRITION - FOCUSED PHYSICAL EXAM:  Flowsheet Row Most Recent Value  Orbital Region No depletion  Upper Arm Region Mild depletion  Thoracic and Lumbar Region  No depletion  Buccal Region No depletion  Temple Region Mild depletion  Clavicle Bone Region Mild depletion  Clavicle and Acromion Bone Region Moderate depletion  Scapular Bone Region Mild depletion  Dorsal Hand Mild depletion  Patellar Region No depletion  Anterior Thigh Region No depletion  Posterior Calf Region Unable to assess  [edema]  Edema (RD Assessment) Severe  [BLE]  Hair Unable to assess   Eyes Reviewed  Mouth Reviewed  Skin Reviewed  Nails Reviewed       Diet Order:   Diet Order            Diet clear liquid Room service appropriate? Yes; Fluid consistency: Thin  Diet effective now                 EDUCATION NEEDS:   No education needs have been identified at this time  Skin:  Skin Assessment: Skin Integrity Issues: Skin Integrity Issues:: Stage III Stage III: sacrum  Last BM:  PTA  Height:   Ht Readings from Last 1 Encounters:  05/30/20 5\' 4"  (1.626 m)    Weight:   Wt Readings from Last 1 Encounters:  05/30/20 67.9 kg    Ideal Body Weight:  54.5 kg  BMI:  Body mass index is 25.69 kg/m.  Estimated Nutritional Needs:   Kcal:  1950-2150  Protein:  100-115 grams  Fluid:  >1.5 L/day    Ronnald Nian, Dietetic Intern Pager: [phone] If unavailable: [phone]

## 2020-05-30 NOTE — ED Notes (Signed)
Report given to [employer]

## 2020-05-30 NOTE — Progress Notes (Signed)
PROGRESS NOTE    Tamara Wood  DEY:[id number] DOB: 1945/07/11 DOA: 05/29/2020 PCP: Isaac Bliss, Rayford Halsted, MD   Chief Complain: Abdominal pain, weakness  Brief Narrative: Patient is a 74 year old female with history of pancreatic cancer currently on palliative chemotherapy, diabetes type 2, hypertension, Jehovah's Witness who presented with weakness, abdominal pain.  She was also having nausea and vomiting and some loose stools at home.  She was diagnosed with pancreatic cancer about a year ago and underwent Whipple procedure in March 2021 but could not complete secondary to metastatic disease and instead underwent gastrojejunal bypass.  She is currently on palliative chemotherapy,follows with Dr Burr Medico .On presentation she was hypotensive, tachycardic.  Patient was admitted for the management of severe sepsis.  Found to have pneumonia and SBP.  Assessment & Plan:   Active Problems:   Pancreatic cancer ([place])   Hypertension   DM (diabetes mellitus), type 2 (HCC)   Ascites   Sepsis ([place])   Acute lower UTI   Elevated LFTs   Thrombocytopenia (HCC)   SBP (spontaneous bacterial peritonitis) ([place])   Hypoglycemia without diagnosis of diabetes mellitus   Pressure injury of skin  Severe sepsis: Presented with hypotension, tachycardia, lactic acidosis, elevated white cell counts.  Ascitic fluid analysis suggestive of SBP .imaging showed left middle/lower zone infiltrate suspicios for pneumonia.  She has been started on broad-spectrum antibiotics which we will continue for now.  Follow cultures  SBP: Found to have ascites.  He underwent diagnostic paracentesis in the emergency department with finding of SBP(total neutrophils of 9450).  Continue current antibiotics for now in order to cover for pneumonia as well.  Will narrow antibiotics when appropriate.  Will follow ascitic fluid culture.  Most likely she needs to be continued on indefinite  antibiotic therapy for SBP prophylaxis  Acute hypoxic  respiratory failure: Currently requiring 1-2 L of oxygen per minute.  Chest imaging showed bilateral pleural effusions with compression atelectasis, possibility of infiltrate on the left lung.  Continue supplemental oxygen.  We will wean the oxygen when appropriate.  Advanced pancreatic cancer: Follows with oncology, Dr. Burr Medico.  Currently on palliative chemotherapy.  CT imagings did not show progression of her malignancy, showed hepatic steatosis.  She is status post biliary stent.She  has elevated ALP and mild elevated liver enzymes along with total bilirubin.  Last chemo was on 12/2.  Oncology has been consulted and following.  Anasarca: Presented with abdominal distention,severe lower extremity edema, bilateral pleural effusion.  Most likely this is from hypoalbuminemia.  Diagnostic paracentesis was done in the emergency department.  We will follow-up report along with cytology.  Cannot aggressively diurese her because of low blood pressure.  Started on low-dose Lasix.  Thrombocytopenia: Most likely history with chemotherapy.  Continue to monitor.  Macrocytic anemia: Hemoglobin in the range of 8.  No report of hematochezia or melena.  Will check vitamin D14 and folic acid.  Hypomagnesemia: Being supplemented with Mg.  UTI: She was recently started treatment for urinary tract infection and was on Cipro.  Follow-up urine culture and continue current antibiotics  Severe protein calorie malnutrition: Has severe hypoalbuminemia.  Nutrition consulted.  IV albumin ordered in the setting of SBP.  Hypoglycemia: Continue to monitor CBGs.           DVT prophylaxis:SCD Code Status: Partial Family Communication: Extensive discussion held with the daughter-in-law at the bedside Status is: Inpatient  Remains inpatient appropriate because:Inpatient level of care appropriate due to severity of illness   Dispo: The patient  is from: Home              Anticipated d/c is to: Home               Anticipated d/c date is: 2 days              Patient currently is not medically stable to d/c.  Hopefully she can be moved to telemetry tomorrow   Consultants: Oncology  Procedures: Paracentesis  Antimicrobials:  Anti-infectives (From admission, onward)   Start     Dose/Rate Route Frequency Ordered Stop   05/30/20 1600  vancomycin (VANCOCIN) IVPB 1000 mg/200 mL premix        1,000 mg 200 mL/hr over 60 Minutes Intravenous Every 24 hours 05/29/20 2021     05/29/20 2200  piperacillin-tazobactam (ZOSYN) IVPB 3.375 g        3.375 g 12.5 mL/hr over 240 Minutes Intravenous Every 8 hours 05/29/20 2026     05/29/20 1600  vancomycin (VANCOREADY) IVPB 1250 mg/250 mL        1,250 mg 166.7 mL/hr over 90 Minutes Intravenous  Once 05/29/20 1551 05/29/20 2057   05/29/20 1545  piperacillin-tazobactam (ZOSYN) IVPB 3.375 g        3.375 g 100 mL/hr over 30 Minutes Intravenous  Once 05/29/20 1533 05/29/20 1635   05/29/20 1230  azithromycin (ZITHROMAX) 500 mg in sodium chloride 0.9 % 250 mL IVPB        500 mg 250 mL/hr over 60 Minutes Intravenous  Once 05/29/20 1228 05/29/20 1555   05/29/20 1015  cefTRIAXone (ROCEPHIN) 1 g in sodium chloride 0.9 % 100 mL IVPB  Status:  Discontinued        1 g 200 mL/hr over 30 Minutes Intravenous Every 24 hours 05/29/20 1014 05/29/20 2026      Subjective: Patient seen and examined at the bedside this morning.  Hemodynamically stable but her blood pressure was soft during my evaluation.  She was sitting in the chair.  She looked overall comfortable.  She has severe anasarca.  Daughter-in-law was present at the bedside.  She complains of some abdominal discomfort when palpated.  She was in 1 L of oxygen per minute  Objective: Vitals:   05/30/20 0413 05/30/20 0500 05/30/20 0600 05/30/20 0700  BP:  (!) 108/49 (!) 118/51 106/61  Pulse:  98 99 93  Resp:  16 19 14   Temp: (!) 97.4 F (36.3 C)     TempSrc: Axillary     SpO2:  98% 98% 97%  Weight:      Height:         Intake/Output Summary (Last 24 hours) at 05/30/2020 0819 Last data filed at 05/30/2020 0700 Gross per 24 hour  Intake 897.9 ml  Output 150 ml  Net 747.9 ml   Filed Weights   05/29/20 0911 05/30/20 0127  Weight: 56.2 kg 67.9 kg    Examination:  General exam: Chronically ill looking, deconditioned, debilitated HEENT:PERRL,Oral mucosa moist, Ear/Nose normal on gross exam Respiratory system: Bilateral diminished air sounds on the bases bilaterally, mild basilar crackles  cardiovascular system: S1 & S2 heard, RRR. No JVD, murmurs, rubs, gallops or clicks.  Severe bilateral lower extremity edema Gastrointestinal system: Abdomen is distended, soft and mild generalized tenderness. No organomegaly or masses felt. Normal bowel sounds heard. Central nervous system: Alert and oriented. No focal neurological deficits. Extremities: 3-4+ bilateral lower extremity edema, no clubbing ,no cyanosis Skin: No rashes, lesions or ulcers,no icterus ,no pallor    Data  Reviewed: I have personally reviewed following labs and imaging studies  CBC: Recent Labs  Lab 05/29/20 1013 05/30/20 0607  WBC 26.2* 25.9*  NEUTROABS 22.5* 22.5*  HGB 10.2* 8.5*  HCT 29.6* 24.5*  MCV 99.7 101.2*  PLT 71* 87*   Basic Metabolic Panel: Recent Labs  Lab 05/29/20 1013 05/30/20 0607  NA 136 137  K 3.9 4.5  CL 106 108  CO2 19* 20*  GLUCOSE 59* 86  BUN 14 14  CREATININE 0.89 0.95  CALCIUM 7.5* 7.0*  MG  --  1.1*  PHOS  --  3.2   GFR: Estimated Creatinine Clearance: 48.5 mL/min (by C-G formula based on SCr of 0.95 mg/dL). Liver Function Tests: Recent Labs  Lab 05/29/20 1013 05/30/20 0607  AST 85* 73*  ALT 31 29  ALKPHOS 373* 281*  BILITOT 1.7* 1.6*  PROT 5.1* 4.3*  ALBUMIN 1.7* 1.4*   No results for input(s): LIPASE, AMYLASE in the last 168 hours. Recent Labs  Lab 05/29/20 2030  AMMONIA 36*   Coagulation Profile: Recent Labs  Lab 05/29/20 1013  INR 2.5*   Cardiac Enzymes: No  results for input(s): CKTOTAL, CKMB, CKMBINDEX, TROPONINI in the last 168 hours. BNP (last 3 results) No results for input(s): PROBNP in the last 8760 hours. HbA1C: Recent Labs    05/30/20 0607  HGBA1C 6.0*   CBG: Recent Labs  Lab 05/30/20 0025 05/30/20 0301 05/30/20 0305 05/30/20 0408 05/30/20 0726  GLUCAP 79 64* 76 76 77   Lipid Profile: No results for input(s): CHOL, HDL, LDLCALC, TRIG, CHOLHDL, LDLDIRECT in the last 72 hours. Thyroid Function Tests: Recent Labs    05/30/20 0607  TSH 1.664   Anemia Panel: No results for input(s): VITAMINB12, FOLATE, FERRITIN, TIBC, IRON, RETICCTPCT in the last 72 hours. Sepsis Labs: Recent Labs  Lab 05/29/20 2030 05/29/20 2232 05/30/20 0300 05/30/20 0607  PROCALCITON 4.60  --   --   --   LATICACIDVEN 5.0* 4.9* 3.8* 2.7*    Recent Results (from the past 240 hour(s))  Resp Panel by RT-PCR (Flu A&B, Covid) Nasopharyngeal Swab     Status: None   Collection Time: 05/29/20 11:17 AM   Specimen: Nasopharyngeal Swab; Nasopharyngeal(NP) swabs in vial transport medium  Result Value Ref Range Status   SARS Coronavirus 2 by RT PCR NEGATIVE NEGATIVE Final    Comment: (NOTE) SARS-CoV-2 target nucleic acids are NOT DETECTED.  The SARS-CoV-2 RNA is generally detectable in upper respiratory specimens during the acute phase of infection. The lowest concentration of SARS-CoV-2 viral copies this assay can detect is 138 copies/mL. A negative result does not preclude SARS-Cov-2 infection and should not be used as the sole basis for treatment or other patient management decisions. A negative result may occur with  improper specimen collection/handling, submission of specimen other than nasopharyngeal swab, presence of viral mutation(s) within the areas targeted by this assay, and inadequate number of viral copies(<138 copies/mL). A negative result must be combined with clinical observations, patient history, and epidemiological information. The  expected result is Negative.  Fact Sheet for Patients:  EntrepreneurPulse.com.au  Fact Sheet for Healthcare Providers:  IncredibleEmployment.be  This test is no t yet approved or cleared by the [place] FDA and  has been authorized for detection and/or diagnosis of SARS-CoV-2 by FDA under an Emergency Use Authorization (EUA). This EUA will remain  in effect (meaning this test can be used) for the duration of the COVID-19 declaration under Section 564(b)(1) of the Act, 21 U.S.C.section 360bbb-3(b)(1), unless  the authorization is terminated  or revoked sooner.       Influenza A by PCR NEGATIVE NEGATIVE Final   Influenza B by PCR NEGATIVE NEGATIVE Final    Comment: (NOTE) The Xpert Xpress SARS-CoV-2/FLU/RSV plus assay is intended as an aid in the diagnosis of influenza from Nasopharyngeal swab specimens and should not be used as a sole basis for treatment. Nasal washings and aspirates are unacceptable for Xpert Xpress SARS-CoV-2/FLU/RSV testing.  Fact Sheet for Patients: EntrepreneurPulse.com.au  Fact Sheet for Healthcare Providers: IncredibleEmployment.be  This test is not yet approved or cleared by the [place] FDA and has been authorized for detection and/or diagnosis of SARS-CoV-2 by FDA under an Emergency Use Authorization (EUA). This EUA will remain in effect (meaning this test can be used) for the duration of the COVID-19 declaration under Section 564(b)(1) of the Act, 21 U.S.C. section 360bbb-3(b)(1), unless the authorization is terminated or revoked.  Performed at [facility], [place] [address], [place], [place] [address]   Body fluid culture (includes gram stain)     Status: None (Preliminary result)   Collection Time: 05/29/20  4:34 PM   Specimen: Peritoneal Washings; Peritoneal Fluid  Result Value Ref Range Status   Specimen Description   Final    PERITONEAL  FLUID Performed at [facility], [place] [address] [place], [place] [address]    Special Requests   Final    NONE Performed at [facility], [place] [address], [place], [place] [address]    Gram Stain   Final    FEW WBC PRESENT, PREDOMINANTLY MONONUCLEAR NO ORGANISMS SEEN Performed at [facility], [place] [address], [place], [place] [address]    Culture PENDING  Incomplete   Report Status PENDING  Incomplete  MRSA PCR Screening     Status: None   Collection Time: 05/30/20  1:26 AM   Specimen: Nasal Mucosa; Nasopharyngeal  Result Value Ref Range Status   MRSA by PCR NEGATIVE NEGATIVE Final    Comment:        The GeneXpert MRSA Assay (FDA approved for NASAL specimens only), is one component of a comprehensive MRSA colonization surveillance program. It is not intended to diagnose MRSA infection nor to guide or monitor treatment for MRSA infections. Performed at [facility], [place] [address], [place],  [address]          Radiology Studies: CT Angio Chest PE W and/or Wo Contrast  Result Date: 05/29/2020 CLINICAL DATA:  Syncopal episode, acute abdominal pain, weakness, history of pancreatic cancer on chemotherapy EXAM: CT ANGIOGRAPHY CHEST CT ABDOMEN AND PELVIS WITH CONTRAST TECHNIQUE: Multidetector CT imaging of the chest was performed using the standard protocol during bolus administration of intravenous contrast. Multiplanar CT image reconstructions and MIPs were obtained to evaluate the vascular anatomy. Multidetector CT imaging of the abdomen and pelvis was performed using the standard protocol during bolus administration of intravenous contrast. CONTRAST:  137mL OMNIPAQUE IOHEXOL 350 MG/ML SOLN IV. No oral contrast. COMPARISON:  CT abdomen and pelvis 03/06/2020, CT chest 11/29/2019 FINDINGS: CTA CHEST FINDINGS Cardiovascular: Atherosclerotic calcifications aorta and coronary arteries. RIGHT jugular Port-A-Cath with tip in RIGHT  atrium. Aorta normal caliber without aneurysm or dissection. Heart normal size. No pericardial effusion. Pulmonary arteries adequately opacified and grossly patent. No evidence of pulmonary embolism. Mediastinum/Nodes: Soft is unremarkable. Base of cervical region normal appearance. No thoracic adenopathy. Lungs/Pleura: Moderate pleural effusions bilaterally. Compressive atelectasis of BILATERAL lower lobes and posterior upper lobes  minimal infiltrate in upper lobes. No pneumothorax. No definite pulmonary mass/nodule. Musculoskeletal: Diffuse osseous demineralization. No discrete bone lesion. Review of the MIP images confirms the above findings. CT ABDOMEN and PELVIS FINDINGS Hepatobiliary: Marked hepatic steatosis. Gallbladder surgically absent. CBD stent. No hepatic mass or biliary dilatation. Small nodule anterior to the LEFT lobe of the liver on the previous exam is poorly demonstrated on the current study, subjectively smaller but difficult to accurately measure due to ascites. Pancreas: Mass at pancreatic head/uncinate 4.0 x 4.1 cm, appears slightly smaller. Mild pancreatic ductal dilatation at body and proximal tail. No additional mass. 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 Adrenal glands otherwise unremarkable. Small BILATERAL renal cysts. No hydronephrosis, hydroureter, or urinary tract calcification. Bladder decompressed. Stomach/Bowel: Scattered bowel wall thickening, mild in degree, may be related to ascites. Diverticulosis of descending and sigmoid colon without definite wall thickening to suggest diverticulitis. Appendix surgically absent by history. Prior gastric bypass surgery. Vascular/Lymphatic: Extensive atherosclerotic calcifications aorta and iliac arteries without aneurysm. No definite adenopathy. Reproductive: Atrophic uterus with unremarkable ovaries Other: Significant ascites. No definite peritoneal based  mass. Scattered subcutaneous edema. No free air. No hernia. Musculoskeletal: Osseous demineralization. Review of the MIP images confirms the above findings. IMPRESSION: No evidence of pulmonary embolism. BILATERAL pleural effusions with compressive atelectasis of the lower lobes and posterior upper lobes. Slight decrease in size of pancreatic head mass. Marked hepatic steatosis. Slight decrease in size of nodule at the anterior surface LEFT lobe liver. CBD stent without biliary dilatation. Scattered bowel wall thickening, may be related to ascites, unable to exclude enteritis. Distal colonic diverticulosis without evidence of diverticulitis. LEFT adrenal myelolipoma. Aortic Atherosclerosis (ICD10-I70.0). Electronically Signed   By: Lavonia Dana M.D.   On: 05/29/2020 15:00   CT ABDOMEN PELVIS W CONTRAST  Result Date: 05/29/2020 CLINICAL DATA:  Syncopal episode, acute abdominal pain, weakness, history of pancreatic cancer on chemotherapy EXAM: CT ANGIOGRAPHY CHEST CT ABDOMEN AND PELVIS WITH CONTRAST TECHNIQUE: Multidetector CT imaging of the chest was performed using the standard protocol during bolus administration of intravenous contrast. Multiplanar CT image reconstructions and MIPs were obtained to evaluate the vascular anatomy. Multidetector CT imaging of the abdomen and pelvis was performed using the standard protocol during bolus administration of intravenous contrast. CONTRAST:  127mL OMNIPAQUE IOHEXOL 350 MG/ML SOLN IV. No oral contrast. COMPARISON:  CT abdomen and pelvis 03/06/2020, CT chest 11/29/2019 FINDINGS: CTA CHEST FINDINGS Cardiovascular: Atherosclerotic calcifications aorta and coronary arteries. RIGHT jugular Port-A-Cath with tip in RIGHT atrium. Aorta normal caliber without aneurysm or dissection. Heart normal size. No pericardial effusion. Pulmonary arteries adequately opacified and grossly patent. No evidence of pulmonary embolism. Mediastinum/Nodes: Soft is unremarkable. Base of cervical  region normal appearance. No thoracic adenopathy. Lungs/Pleura: Moderate pleural effusions bilaterally. Compressive atelectasis of BILATERAL lower lobes and posterior upper lobes minimal infiltrate in upper lobes. No pneumothorax. No definite pulmonary mass/nodule. Musculoskeletal: Diffuse osseous demineralization. No discrete bone lesion. Review of the MIP images confirms the above findings. CT ABDOMEN and PELVIS FINDINGS Hepatobiliary: Marked hepatic steatosis. Gallbladder surgically absent. CBD stent. No hepatic mass or biliary dilatation. Small nodule anterior to the LEFT lobe of the liver on the previous exam is poorly demonstrated on the current study, subjectively smaller but difficult to accurately measure due to ascites. Pancreas: Mass at pancreatic head/uncinate 4.0 x 4.1 cm, appears slightly smaller. Mild pancreatic ductal dilatation at body and proximal tail. No additional mass. Spleen: Normal appearance Adrenals/Urinary Tract:  LEFT adrenal fat containing nodule 16 x 12 mm consistent with myelolipoma. Adrenal glands otherwise unremarkable. Small BILATERAL renal cysts. No hydronephrosis, hydroureter, or urinary tract calcification. Bladder decompressed. Stomach/Bowel: Scattered bowel wall thickening, mild in degree, may be related to ascites. Diverticulosis of descending and sigmoid colon without definite wall thickening to suggest diverticulitis. Appendix surgically absent by history. Prior gastric bypass surgery. Vascular/Lymphatic: Extensive atherosclerotic calcifications aorta and iliac arteries without aneurysm. No definite adenopathy. Reproductive: Atrophic uterus with unremarkable ovaries Other: Significant ascites. No definite peritoneal based mass. Scattered subcutaneous edema. No free air. No hernia. Musculoskeletal: Osseous demineralization. Review of the MIP images confirms the above findings. IMPRESSION: No evidence of pulmonary embolism. BILATERAL pleural effusions with compressive  atelectasis of the lower lobes and posterior upper lobes. Slight decrease in size of pancreatic head mass. Marked hepatic steatosis. Slight decrease in size of nodule at the anterior surface LEFT lobe liver. CBD stent without biliary dilatation. Scattered bowel wall thickening, may be related to ascites, unable to exclude enteritis. Distal colonic diverticulosis without evidence of diverticulitis. LEFT adrenal myelolipoma. Aortic Atherosclerosis (ICD10-I70.0). Electronically Signed   By: Lavonia Dana M.D.   On: 05/29/2020 15:00   DG Chest Port 1 View  Result Date: 05/29/2020 CLINICAL DATA:  Evaluate for sepsis. Shortness of breath and cough. History of pancreas cancer. EXAM: PORTABLE CHEST 1 VIEW COMPARISON:  CT chest 11/29/2019 FINDINGS: Right chest wall port a catheter identified with tip at the cavoatrial junction. Normal heart size. A small left pleural effusion is suspected. Additionally, there are patchy airspace densities within the mid and lower left lung zone. Right lung appears clear. IMPRESSION: Left mid and lower lung zone airspace densities compatible with pneumonia. Suspect small left pleural effusion. Electronically Signed   By: Kerby Moors M.D.   On: 05/29/2020 10:34        Scheduled Meds: . Chlorhexidine Gluconate Cloth  6 each Topical Q0600  . hydrocortisone  1 application Rectal BID  . insulin aspart  0-9 Units Subcutaneous Q4H  . lidocaine-EPINEPHrine  10 mL Infiltration Once  . mouth rinse  15 mL Mouth Rinse BID  . pantoprazole  20 mg Oral Daily   Continuous Infusions: . sodium chloride 75 mL/hr (05/30/20 0522)  . magnesium sulfate bolus IVPB    . piperacillin-tazobactam (ZOSYN)  IV 3.375 g (05/30/20 0626)  . vancomycin       LOS: 1 day    Time spent: 35 mins.More than 50% of that time was spent in counseling and/or coordination of care.      Shelly Coss, MD [facility] P12/14/2021, 8:19 AM

## 2020-05-30 NOTE — Plan of Care (Signed)
Pt will become less edematous throughout, show no s/sxs of sepsis, no increased work of breathing or increased O2 requirements. Pt will be able to effectively communicate needs via video interpreter at bedside in pt's room.

## 2020-05-30 NOTE — Evaluation (Signed)
Physical Therapy Evaluation Patient Details Name: Tamara Wood MRN: [id number] DOB: [birth year] Today's Date: 05/30/2020   History of Present Illness  Patient is a 74 year old  female with PMH of Pancreatic cancer, DM2, HTN, admitted with abdominal pain and increased fatigue. Patient diagnosed with severe sepsis, PNA and spontaneous bacterial peritonitis  Clinical Impression  The patient is  Resting in bed, PATIENT PREFERS [name] TO ACT AS INTERPRETER.  The patient is from home, ambulatory with RW. Patient is weak and limited tolerance to mobility to recliner today. SPO2 on 2 L  95%, HR in low 100's. BP 95/55 prior to mobility, post transfer 79/55. Patient plans to DC to home . Pt admitted with above diagnosis.  Pt currently with functional limitations due to the deficits listed below (see PT Problem List). Pt will benefit from skilled PT to increase their independence and safety with mobility to allow discharge to the venue listed below.     Follow Up Recommendations Home health PT    Equipment Recommendations  None recommended by PT    Recommendations for Other Services       Precautions / Restrictions Precautions Precautions: Fall Precaution Comments: monitor BP Restrictions Weight Bearing Restrictions: No      Mobility  Bed Mobility Overal bed mobility: Needs Assistance Bed Mobility: Supine to Sit     Supine to sit: Min assist;HOB elevated     General bed mobility comments: for LE management    Transfers Overall transfer level: Needs assistance Equipment used: Rolling walker (2 wheeled) Transfers: Sit to/from [facility] Sit to Stand: Min assist;+2 safety/equipment;From elevated surface Stand pivot transfers: Min assist;+2 safety/equipment       General transfer comment: steady assistance to stand from bed. Attempted to take a few more steps afte transferred infront of  recliner but complained on dizziness.  Ambulation/Gait              General Gait Details: TBA  Stairs            Wheelchair Mobility    Modified Rankin (Stroke Patients Only)       Balance Overall balance assessment: Needs assistance Sitting-balance support: Feet supported Sitting balance-Leahy Scale: Fair     Standing balance support: Bilateral upper extremity supported;During functional activity Standing balance-Leahy Scale: Poor Standing balance comment: reliant on B UE support and external assistance                             Pertinent Vitals/Pain Pain Assessment: No/denies pain    Home Living Family/patient expects to be discharged to:: Private residence Living Arrangements: Children Available Help at Discharge: Family;Available 24 hours/day Type of Home: House Home Access: Other (comment) (threshold step)     Home Layout: One level Home Equipment: Bedside commode;Cane - single point;Walker - 2 wheels;Shower seat;Grab bars - tub/shower      Prior Function Level of Independence: Needs assistance   Gait / Transfers Assistance Needed: ambulating with walker  ADL's / Homemaking Assistance Needed: has assistance in/out of shower but can bath herself, needs ocassional assist with dressing        Hand Dominance   Dominant Hand: Right    Extremity/Trunk Assessment   Upper Extremity Assessment Upper Extremity Assessment: Generalized weakness    Lower Extremity Assessment Lower Extremity Assessment: Generalized weakness    Cervical / Trunk Assessment Cervical / Trunk Assessment: Normal  Communication   Communication: Prefers language other than [place]  Cognition Arousal/Alertness: Awake/alert Behavior During Therapy: WFL for tasks assessed/performed Overall Cognitive Status: Within Functional Limits for tasks assessed                                 General Comments: daughter  in law present to interpret.      General Comments General comments (skin integrity, edema, etc.):  patient orthostatic in standing, please see PT note for vitals    Exercises     Assessment/Plan    PT Assessment Patient needs continued PT services  PT Problem List Decreased strength;Decreased mobility;Decreased activity tolerance;Decreased balance;Decreased knowledge of use of DME       PT Treatment Interventions DME instruction;Therapeutic activities;Gait training;Therapeutic exercise;Patient/family education;Functional mobility training    PT Goals (Current goals can be found in the Care Plan section)  Acute Rehab PT Goals Patient Stated Goal: "she walked more than this before her feet got swollen" PT Goal Formulation: With patient/family Time For Goal Achievement: 06/13/20 Potential to Achieve Goals: Fair    Frequency Min 3X/week   Barriers to discharge        Co-evaluation PT/OT/SLP Co-Evaluation/Treatment: Yes Reason for Co-Treatment: For patient/therapist safety;To address functional/ADL transfers PT goals addressed during session: Mobility/safety with mobility OT goals addressed during session: ADL's and self-care       AM-PAC PT "6 Clicks" Mobility  Outcome Measure Help needed turning from your back to your side while in a flat bed without using bedrails?: A Lot Help needed moving from lying on your back to sitting on the side of a flat bed without using bedrails?: A Lot Help needed moving to and from a bed to a chair (including a wheelchair)?: A Lot Help needed standing up from a chair using your arms (e.g., wheelchair or bedside chair)?: A Lot Help needed to walk in hospital room?: Total Help needed climbing 3-5 steps with a railing? : Total 6 Click Score: 10    End of Session Equipment Utilized During Treatment: Gait belt;Oxygen Activity Tolerance: Patient limited by fatigue Patient left: in chair;with call bell/phone within reach;with chair alarm set;with family/visitor present Nurse Communication: Mobility status PT Visit Diagnosis: Unsteadiness on  feet (R26.81);Muscle weakness (generalized) (M62.81);Difficulty in walking, not elsewhere classified (R26.2)    Time: 2820-6015 PT Time Calculation (min) (ACUTE ONLY): 25 min   Charges:   PT Evaluation $PT Eval Low Complexity: [facility] PT Acute Rehabilitation Services Pager [phone] Office [phone]   Claretha Cooper 05/30/2020, 1:15 PM

## 2020-05-31 DIAGNOSIS — R109 Unspecified abdominal pain: Secondary | ICD-10-CM

## 2020-05-31 LAB — RETICULOCYTES
Immature Retic Fract: 23.7 % — ABNORMAL HIGH (ref 2.3–15.9)
RBC.: 1.9 MIL/uL — ABNORMAL LOW (ref 3.87–5.11)
Retic Count, Absolute: 91.2 10*3/uL (ref 19.0–186.0)
Retic Ct Pct: 4.8 % — ABNORMAL HIGH (ref 0.4–3.1)

## 2020-05-31 LAB — CBC WITH DIFFERENTIAL/PLATELET
Abs Immature Granulocytes: 0.42 10*3/uL — ABNORMAL HIGH (ref 0.00–0.07)
Basophils Absolute: 0 10*3/uL (ref 0.0–0.1)
Basophils Relative: 0 %
Eosinophils Absolute: 0 10*3/uL (ref 0.0–0.5)
Eosinophils Relative: 0 %
HCT: 17.7 % — ABNORMAL LOW (ref 36.0–46.0)
Hemoglobin: 6.1 g/dL — CL (ref 12.0–15.0)
Immature Granulocytes: 3 %
Lymphocytes Relative: 5 %
Lymphs Abs: 0.8 10*3/uL (ref 0.7–4.0)
MCH: 34.9 pg — ABNORMAL HIGH (ref 26.0–34.0)
MCHC: 34.5 g/dL (ref 30.0–36.0)
MCV: 101.1 fL — ABNORMAL HIGH (ref 80.0–100.0)
Monocytes Absolute: 0.9 10*3/uL (ref 0.1–1.0)
Monocytes Relative: 5 %
Neutro Abs: 14.8 10*3/uL — ABNORMAL HIGH (ref 1.7–7.7)
Neutrophils Relative %: 87 %
Platelets: 73 10*3/uL — ABNORMAL LOW (ref 150–400)
RBC: 1.75 MIL/uL — ABNORMAL LOW (ref 3.87–5.11)
RDW: 19.8 % — ABNORMAL HIGH (ref 11.5–15.5)
WBC: 16.9 10*3/uL — ABNORMAL HIGH (ref 4.0–10.5)
nRBC: 0.5 % — ABNORMAL HIGH (ref 0.0–0.2)

## 2020-05-31 LAB — GLUCOSE, CAPILLARY
Glucose-Capillary: 111 mg/dL — ABNORMAL HIGH (ref 70–99)
Glucose-Capillary: 94 mg/dL (ref 70–99)
Glucose-Capillary: 124 mg/dL — ABNORMAL HIGH (ref 70–99)
Glucose-Capillary: 125 mg/dL — ABNORMAL HIGH (ref 70–99)
Glucose-Capillary: 137 mg/dL — ABNORMAL HIGH (ref 70–99)
Glucose-Capillary: 109 mg/dL — ABNORMAL HIGH (ref 70–99)

## 2020-05-31 LAB — MAGNESIUM: Magnesium: 2 mg/dL (ref 1.7–2.4)

## 2020-05-31 LAB — COMPREHENSIVE METABOLIC PANEL
ALT: 22 U/L (ref 0–44)
AST: 54 U/L — ABNORMAL HIGH (ref 15–41)
Albumin: 3 g/dL — ABNORMAL LOW (ref 3.5–5.0)
Alkaline Phosphatase: 192 U/L — ABNORMAL HIGH (ref 38–126)
Anion gap: 10 (ref 5–15)
BUN: 17 mg/dL (ref 8–23)
CO2: 23 mmol/L (ref 22–32)
Calcium: 7.7 mg/dL — ABNORMAL LOW (ref 8.9–10.3)
Chloride: 107 mmol/L (ref 98–111)
Creatinine, Ser: 1.11 mg/dL — ABNORMAL HIGH (ref 0.44–1.00)
GFR, Estimated: 52 mL/min — ABNORMAL LOW (ref >60)
Glucose, Bld: 104 mg/dL — ABNORMAL HIGH (ref 70–99)
Potassium: 3.5 mmol/L (ref 3.5–5.1)
Sodium: 140 mmol/L (ref 135–145)
Total Bilirubin: 1.7 mg/dL — ABNORMAL HIGH (ref 0.3–1.2)
Total Protein: 4.8 g/dL — ABNORMAL LOW (ref 6.5–8.1)

## 2020-05-31 LAB — IRON AND TIBC: Iron: 50 ug/dL (ref 28–170)

## 2020-05-31 LAB — FOLATE: Folate: 5.7 ng/mL — ABNORMAL LOW (ref >5.9)

## 2020-05-31 LAB — VITAMIN B12: Vitamin B-12: >7500 pg/mL — ABNORMAL HIGH (ref 180–914)

## 2020-05-31 MED ORDER — DARBEPOETIN ALFA 300 MCG/0.6ML IJ SOSY
300.0000 ug | PREFILLED_SYRINGE | Freq: Once | INTRAMUSCULAR | Status: AC
  Administered 2020-05-31: 20:00:00 300 ug via SUBCUTANEOUS
  Filled 2020-05-31: qty 0.6

## 2020-05-31 NOTE — Progress Notes (Signed)
Tamara Wood   DOB:10-25-1945   IR#:[id number]   QPY#:[id number]  Oncology follow up   Subjective: Patient is in ICU, she is hemodynamically stable, and remains to be afebrile.  Abdominal pain has resolved.  She is overall very weak, has not been able to get out of bed.  Hemoglobin dropped significantly this morning, no overt GI bleeding.  Her daughter-in-law was at bedside when I saw her.  Objective:  Vitals:   05/31/20 1600 05/31/20 2000  BP:  134/71  Pulse:  (!) 101  Resp:  (!) 22  Temp: (!) 97.3 F (36.3 C) 97.8 F (36.6 C)  SpO2:  98%    Body mass index is 25.69 kg/m.  Intake/Output Summary (Last 24 hours) at 05/31/2020 2349 Last data filed at 05/31/2020 1528 Gross per 24 hour  Intake 340 ml  Output --  Net 340 ml     Sclerae unicteric  Oropharynx clear  No peripheral adenopathy  Lungs clear -- no rales or rhonchi  Heart regular rate and rhythm  Abdomen soft, distended, with diffuse mild tenderness, and (+) ascites   MSK no focal spinal tenderness, no peripheral edema  Neuro nonfocal    CBG (last 3)  Recent Labs    05/31/20 1603 05/31/20 1945 05/31/20 2258  GLUCAP 125* 137* 109*     Labs:  Urine Studies No results for input(s): UHGB, CRYS in the last 72 hours.  Invalid input(s): UACOL, UAPR, USPG, UPH, UTP, UGL, UKET, UBIL, UNIT, UROB, ULEU, UEPI, UWBC, URBC, UBAC, CAST, [place], [place]  Basic Metabolic Panel: Recent Labs  Lab 05/29/20 1013 05/30/20 0607 05/31/20 0554  NA 136 137 140  K 3.9 4.5 3.5  CL 106 108 107  CO2 19* 20* 23  GLUCOSE 59* 86 104*  BUN 14 14 17   CREATININE 0.89 0.95 1.11*  CALCIUM 7.5* 7.0* 7.7*  MG  --  1.1* 2.0  PHOS  --  3.2  --    GFR Estimated Creatinine Clearance: 41.5 mL/min (A) (by C-G formula based on SCr of 1.11 mg/dL (H)). Liver Function Tests: Recent Labs  Lab 05/29/20 1013 05/30/20 0607 05/31/20 0554  AST 85* 73* 54*  ALT 31 29 22   ALKPHOS 373* 281* 192*  BILITOT 1.7* 1.6* 1.7*  PROT 5.1* 4.3* 4.8*   ALBUMIN 1.7* 1.4* 3.0*   No results for input(s): LIPASE, AMYLASE in the last 168 hours. Recent Labs  Lab 05/29/20 2030  AMMONIA 36*   Coagulation profile Recent Labs  Lab 05/29/20 1013  INR 2.5*    CBC: Recent Labs  Lab 05/29/20 1013 05/30/20 0607 05/31/20 0554  WBC 26.2* 25.9* 16.9*  NEUTROABS 22.5* 22.5* 14.8*  HGB 10.2* 8.5* 6.1*  HCT 29.6* 24.5* 17.7*  MCV 99.7 101.2* 101.1*  PLT 71* 87* 73*   Cardiac Enzymes: No results for input(s): CKTOTAL, CKMB, CKMBINDEX, TROPONINI in the last 168 hours. BNP: Invalid input(s): POCBNP CBG: Recent Labs  Lab 05/31/20 0757 05/31/20 1220 05/31/20 1603 05/31/20 1945 05/31/20 2258  GLUCAP 94 124* 125* 137* 109*   D-Dimer No results for input(s): DDIMER in the last 72 hours. Hgb A1c Recent Labs    05/30/20 0607  HGBA1C 6.0*   Lipid Profile No results for input(s): CHOL, HDL, LDLCALC, TRIG, CHOLHDL, LDLDIRECT in the last 72 hours. Thyroid function studies Recent Labs    05/30/20 0607  TSH 1.664   Anemia work up Recent Labs    05/30/20 0600 05/31/20 0938 05/31/20 0939 05/31/20 1647  VITAMINB12 >7,500* >7,500*  --   --  FOLATE 7.3  --  5.7*  --   TIBC  --  NOT CALCULATED  --   --   IRON  --  50  --   --   RETICCTPCT  --   --   --  4.8*   Microbiology Recent Results (from the past 240 hour(s))  Blood Culture (routine x 2)     Status: None (Preliminary result)   Collection Time: 05/29/20 11:00 AM   Specimen: BLOOD  Result Value Ref Range Status   Specimen Description   Final    BLOOD LEFT ANTECUBITAL Performed at [facility] [address], [place], [place] [address]    Special Requests   Final    BOTTLES DRAWN AEROBIC AND ANAEROBIC Blood Culture results may not be optimal due to an inadequate volume of blood received in culture bottles Performed at [facility] [address], [place], [place] [address]    Culture   Final    NO GROWTH 2 DAYS Performed at  [facility] [address] [place], [place] [address]    Report Status PENDING  Incomplete  Blood Culture (routine x 2)     Status: None (Preliminary result)   Collection Time: 05/29/20 11:12 AM   Specimen: BLOOD  Result Value Ref Range Status   Specimen Description   Final    BLOOD RIGHT ANTECUBITAL Performed at [facility] [address], [place], [place] [address]    Special Requests   Final    BOTTLES DRAWN AEROBIC AND ANAEROBIC Blood Culture adequate volume Performed at [facility] [address] [place], [place] [address]    Culture   Final    NO GROWTH 2 DAYS Performed at [facility] [address] [place],  [address]    Report Status PENDING  Incomplete  Resp Panel by RT-PCR (Flu A&B, Covid) Nasopharyngeal Swab     Status: None   Collection Time: 05/29/20 11:17 AM   Specimen: Nasopharyngeal Swab; Nasopharyngeal(NP) swabs in vial transport medium  Result Value Ref Range Status   SARS Coronavirus 2 by RT PCR NEGATIVE NEGATIVE Final    Comment: (NOTE) SARS-CoV-2 target nucleic acids are NOT DETECTED.  The SARS-CoV-2 RNA is generally detectable in upper respiratory specimens during the acute phase of infection. The lowest concentration of SARS-CoV-2 viral copies this assay can detect is 138 copies/mL. A negative result does not preclude SARS-Cov-2 infection and should not be used as the sole basis for treatment or other patient management decisions. A negative result may occur with  improper specimen collection/handling, submission of specimen other than nasopharyngeal swab, presence of viral mutation(s) within the areas targeted by this assay, and inadequate number of viral copies(<138 copies/mL). A negative result must be combined with clinical observations, patient history, and epidemiological information. The expected result is Negative.  Fact Sheet for Patients:   EntrepreneurPulse.com.au  Fact Sheet for Healthcare Providers:  IncredibleEmployment.be  This test is no t yet approved or cleared by the [place] FDA and  has been authorized for detection and/or diagnosis of SARS-CoV-2 by FDA under an Emergency Use Authorization (EUA). This EUA will remain  in effect (meaning this test can be used) for the duration of the COVID-19 declaration under Section 564(b)(1) of the Act, 21 U.S.C.section 360bbb-3(b)(1), unless the authorization is terminated  or revoked sooner.       Influenza A by PCR NEGATIVE NEGATIVE Final   Influenza B by PCR  NEGATIVE NEGATIVE Final    Comment: (NOTE) The Xpert Xpress SARS-CoV-2/FLU/RSV plus assay is intended as an aid in the diagnosis of influenza from Nasopharyngeal swab specimens and should not be used as a sole basis for treatment. Nasal washings and aspirates are unacceptable for Xpert Xpress SARS-CoV-2/FLU/RSV testing.  Fact Sheet for Patients: EntrepreneurPulse.com.au  Fact Sheet for Healthcare Providers: IncredibleEmployment.be  This test is not yet approved or cleared by the [place] FDA and has been authorized for detection and/or diagnosis of SARS-CoV-2 by FDA under an Emergency Use Authorization (EUA). This EUA will remain in effect (meaning this test can be used) for the duration of the COVID-19 declaration under Section 564(b)(1) of the Act, 21 U.S.C. section 360bbb-3(b)(1), unless the authorization is terminated or revoked.  Performed at [facility], [place] [address] [place], [place] [address]   Urine culture     Status: None   Collection Time: 05/29/20 12:48 PM   Specimen: In/Out Cath Urine  Result Value Ref Range Status   Specimen Description   Final    IN/OUT CATH URINE Performed at [facility] [address] [place], [place] [address]    Special Requests   Final     NONE Performed at [facility], [place] [address], [place], [place] [address]    Culture   Final    NO GROWTH Performed at [facility], [place] [address] [place], [place] [address]    Report Status 05/30/2020 FINAL  Final  Body fluid culture (includes gram stain)     Status: None (Preliminary result)   Collection Time: 05/29/20  4:34 PM   Specimen: Peritoneal Washings; Peritoneal Fluid  Result Value Ref Range Status   Specimen Description   Final    PERITONEAL FLUID Performed at [facility], [place] [address], [place], [place] [address]    Special Requests   Final    NONE Performed at [facility], [place] [address], [place], [place] [address]    Gram Stain   Final    FEW WBC PRESENT, PREDOMINANTLY MONONUCLEAR NO ORGANISMS SEEN    Culture   Final    NO GROWTH 2 DAYS Performed at [facility] [address] [place], [place] [address]    Report Status PENDING  Incomplete  MRSA PCR Screening     Status: None   Collection Time: 05/30/20  1:26 AM   Specimen: Nasal Mucosa; Nasopharyngeal  Result Value Ref Range Status   MRSA by PCR NEGATIVE NEGATIVE Final    Comment:        The GeneXpert MRSA Assay (FDA approved for NASAL specimens only), is one component of a comprehensive MRSA colonization surveillance program. It is not intended to diagnose MRSA infection nor to guide or monitor treatment for MRSA infections. Performed at [facility], [place] [address] [place], [place] [address]       Studies:  No results found.  Assessment: 74 y.o. female with unresectable pancreatic cancer, currently on chemotherapy gemcitabine and Abraxane every 2 weeks, presented with abdominal pain, bloating, and profound weakness.  1. Sepsis secondary to SBP and pneumonia 2.  Unresectable pancreatic cancer, currently on palliative chemotherapy with gemcitabine and Abraxane, last dose on 12/2 3.  New onset ascites and  bilateral pleural effusion, no extremity edema, anasarca, ruled out malignant ascites  4.  Transaminitis and mild hyperbilirubinemia 5.  Anemia secondary to chemo and malignancy, she is a Jehovah witness, worsening  6.  Leukocytosis, secondary to G-CSF, and infection 7.  Severe protein and calorie malnutrition 8.  Thrombocytopenia secondary to chemo and infection  Plan:  -She is currently being treated for SBP and pneumonia, overall abdominal pain and distention improved -Lab reviewed, hemoglobin dropped to 6.1 this morning, no overt GI bleeding.  I ordered Aranesp 300 mcg today, she is a Jehovah witness. -Leukocytosis improved -Her overall prognosis is poor. -We will definitely hold on chemo for now, I an not sure if she is able to tolerate more chemo down the road. Pt understands, and is not keen on chemo  -she is DNR -continue supportive care  -If her condition remains to be tenuous after adequate antibiotics and supportive care, it would be reasonable to discuss home hospice with patient and her family, giving incurable nature of her pancreatic cancer.    Truitt Merle, MD 05/31/2020

## 2020-05-31 NOTE — Progress Notes (Addendum)
[facility] Progress Note  CC: SBP  Subjective: She is tolerating a soft diet as verified by her RN.  No N/V. She complains of having mild right mid abdominal pain. No BM today. I called and spoke to her daughter [name], she is concerned regarding her mother's infectious status.    Objective:  Vital signs in last 24 hours: Temp:  [97.3 F (36.3 C)] 97.3 F (36.3 C) (12/15 0800) Pulse Rate:  [76-88] 76 (12/15 0600) Resp:  [16-29] 16 (12/15 0600) BP: (104-124)/(55-66) 124/55 (12/15 0600) SpO2:  [98 %-99 %] 98 % (12/15 0600) Last BM Date: 05/31/20 General:   Alert 74 year old female in NAD.  Heart: Slightly irregular rhythm, no murmur.  Pulm:  Few scattered expiratory wheezes throughout.  Abdomen:  Soft, nondistended. Mild right mid abdominal tenderness without rebound or guarding. RLQ paracentesis site intact. No obvious residual ascites. Midline abdominal scar intact. Hypoactive bowel sounds x 4 quads.  Extremities:  Without edema. Neurologic:  Alert and  oriented x 4. Speech is clear. She moves all extremities.  Psych:  Alert and cooperative. Normal mood and affect.  Intake/Output from previous day: 12/14 0701 - 12/15 0700 In: 363.8 [P.O.:120; IV Piggyback:243.8] Out: -  Intake/Output this shift: Total I/O In: 240 [P.O.:240] Out: -   Lab Results: Recent Labs    05/29/20 1013 05/30/20 0607 05/31/20 0554  WBC 26.2* 25.9* 16.9*  HGB 10.2* 8.5* 6.1*  HCT 29.6* 24.5* 17.7*  PLT 71* 87* 73*   BMET Recent Labs    05/29/20 1013 05/30/20 0607 05/31/20 0554  NA 136 137 140  K 3.9 4.5 3.5  CL 106 108 107  CO2 19* 20* 23  GLUCOSE 59* 86 104*  BUN 14 14 17   CREATININE 0.89 0.95 1.11*  CALCIUM 7.5* 7.0* 7.7*   LFT Recent Labs    05/31/20 0554  PROT 4.8*  ALBUMIN 3.0*  AST 54*  ALT 22  ALKPHOS 192*  BILITOT 1.7*   PT/INR Recent Labs    05/29/20 1013  LABPROT 26.4*  INR 2.5*   Hepatitis Panel Recent Labs    05/29/20 2030  HEPBSAG  NON REACTIVE  HCVAB NON REACTIVE  HEPAIGM NON REACTIVE  HEPBIGM NON REACTIVE    CT Angio Chest PE W and/or Wo Contrast  Result Date: 05/29/2020 CLINICAL DATA:  Syncopal episode, acute abdominal pain, weakness, history of pancreatic cancer on chemotherapy EXAM: CT ANGIOGRAPHY CHEST CT ABDOMEN AND PELVIS WITH CONTRAST TECHNIQUE: Multidetector CT imaging of the chest was performed using the standard protocol during bolus administration of intravenous contrast. Multiplanar CT image reconstructions and MIPs were obtained to evaluate the vascular anatomy. Multidetector CT imaging of the abdomen and pelvis was performed using the standard protocol during bolus administration of intravenous contrast. CONTRAST:  187m OMNIPAQUE IOHEXOL 350 MG/ML SOLN IV. No oral contrast. COMPARISON:  CT abdomen and pelvis 03/06/2020, CT chest 11/29/2019 FINDINGS: CTA CHEST FINDINGS Cardiovascular: Atherosclerotic calcifications aorta and coronary arteries. RIGHT jugular Port-A-Cath with tip in RIGHT atrium. Aorta normal caliber without aneurysm or dissection. Heart normal size. No pericardial effusion. Pulmonary arteries adequately opacified and grossly patent. No evidence of pulmonary embolism. Mediastinum/Nodes: Soft is unremarkable. Base of cervical region normal appearance. No thoracic adenopathy. Lungs/Pleura: Moderate pleural effusions bilaterally. Compressive atelectasis of BILATERAL lower lobes and posterior upper lobes minimal infiltrate in upper lobes. No pneumothorax. No definite pulmonary mass/nodule. Musculoskeletal: Diffuse osseous demineralization. No discrete bone lesion. Review of the MIP images confirms the above findings. CT ABDOMEN  and PELVIS FINDINGS Hepatobiliary: Marked hepatic steatosis. Gallbladder surgically absent. CBD stent. No hepatic mass or biliary dilatation. Small nodule anterior to the LEFT lobe of the liver on the previous exam is poorly demonstrated on the current study, subjectively smaller but  difficult to accurately measure due to ascites. Pancreas: Mass at pancreatic head/uncinate 4.0 x 4.1 cm, appears slightly smaller. Mild pancreatic ductal dilatation at body and proximal tail. No additional mass. 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 Adrenal glands otherwise unremarkable. Small BILATERAL renal cysts. No hydronephrosis, hydroureter, or urinary tract calcification. Bladder decompressed. Stomach/Bowel: Scattered bowel wall thickening, mild in degree, may be related to ascites. Diverticulosis of descending and sigmoid colon without definite wall thickening to suggest diverticulitis. Appendix surgically absent by history. Prior gastric bypass surgery. Vascular/Lymphatic: Extensive atherosclerotic calcifications aorta and iliac arteries without aneurysm. No definite adenopathy. Reproductive: Atrophic uterus with unremarkable ovaries Other: Significant ascites. No definite peritoneal based mass. Scattered subcutaneous edema. No free air. No hernia. Musculoskeletal: Osseous demineralization. Review of the MIP images confirms the above findings. IMPRESSION: No evidence of pulmonary embolism. BILATERAL pleural effusions with compressive atelectasis of the lower lobes and posterior upper lobes. Slight decrease in size of pancreatic head mass. Marked hepatic steatosis. Slight decrease in size of nodule at the anterior surface LEFT lobe liver. CBD stent without biliary dilatation. Scattered bowel wall thickening, may be related to ascites, unable to exclude enteritis. Distal colonic diverticulosis without evidence of diverticulitis. LEFT adrenal myelolipoma. Aortic Atherosclerosis (ICD10-I70.0). Electronically Signed   By: Lavonia Dana M.D.   On: 05/29/2020 15:00   CT ABDOMEN PELVIS W CONTRAST  Result Date: 05/29/2020 CLINICAL DATA:  Syncopal episode, acute abdominal pain, weakness, history of pancreatic cancer on  chemotherapy EXAM: CT ANGIOGRAPHY CHEST CT ABDOMEN AND PELVIS WITH CONTRAST TECHNIQUE: Multidetector CT imaging of the chest was performed using the standard protocol during bolus administration of intravenous contrast. Multiplanar CT image reconstructions and MIPs were obtained to evaluate the vascular anatomy. Multidetector CT imaging of the abdomen and pelvis was performed using the standard protocol during bolus administration of intravenous contrast. CONTRAST:  170m OMNIPAQUE IOHEXOL 350 MG/ML SOLN IV. No oral contrast. COMPARISON:  CT abdomen and pelvis 03/06/2020, CT chest 11/29/2019 FINDINGS: CTA CHEST FINDINGS Cardiovascular: Atherosclerotic calcifications aorta and coronary arteries. RIGHT jugular Port-A-Cath with tip in RIGHT atrium. Aorta normal caliber without aneurysm or dissection. Heart normal size. No pericardial effusion. Pulmonary arteries adequately opacified and grossly patent. No evidence of pulmonary embolism. Mediastinum/Nodes: Soft is unremarkable. Base of cervical region normal appearance. No thoracic adenopathy. Lungs/Pleura: Moderate pleural effusions bilaterally. Compressive atelectasis of BILATERAL lower lobes and posterior upper lobes minimal infiltrate in upper lobes. No pneumothorax. No definite pulmonary mass/nodule. Musculoskeletal: Diffuse osseous demineralization. No discrete bone lesion. Review of the MIP images confirms the above findings. CT ABDOMEN and PELVIS FINDINGS Hepatobiliary: Marked hepatic steatosis. Gallbladder surgically absent. CBD stent. No hepatic mass or biliary dilatation. Small nodule anterior to the LEFT lobe of the liver on the previous exam is poorly demonstrated on the current study, subjectively smaller but difficult to accurately measure due to ascites. Pancreas: Mass at pancreatic head/uncinate 4.0 x 4.1 cm, appears slightly smaller. Mild pancreatic ductal dilatation at body and proximal tail. No additional mass. Spleen: Normal appearance  Adrenals/Urinary Tract: LEFT adrenal fat containing nodule 16 x 12 mm consistent with myelolipoma. Adrenal glands otherwise unremarkable. Small BILATERAL renal cysts. No hydronephrosis, hydroureter, or urinary tract calcification. Bladder decompressed.  Stomach/Bowel: Scattered bowel wall thickening, mild in degree, may be related to ascites. Diverticulosis of descending and sigmoid colon without definite wall thickening to suggest diverticulitis. Appendix surgically absent by history. Prior gastric bypass surgery. Vascular/Lymphatic: Extensive atherosclerotic calcifications aorta and iliac arteries without aneurysm. No definite adenopathy. Reproductive: Atrophic uterus with unremarkable ovaries Other: Significant ascites. No definite peritoneal based mass. Scattered subcutaneous edema. No free air. No hernia. Musculoskeletal: Osseous demineralization. Review of the MIP images confirms the above findings. IMPRESSION: No evidence of pulmonary embolism. BILATERAL pleural effusions with compressive atelectasis of the lower lobes and posterior upper lobes. Slight decrease in size of pancreatic head mass. Marked hepatic steatosis. Slight decrease in size of nodule at the anterior surface LEFT lobe liver. CBD stent without biliary dilatation. Scattered bowel wall thickening, may be related to ascites, unable to exclude enteritis. Distal colonic diverticulosis without evidence of diverticulitis. LEFT adrenal myelolipoma. Aortic Atherosclerosis (ICD10-I70.0). Electronically Signed   By: Lavonia Dana M.D.   On: 05/29/2020 15:00    Assessment / Plan:  70.  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   She was admitted to [facility] 05/29/2020 with abdominal pain and new onset ascites. Elevated LFTs. Acute hepatitis panel negative. S/P paracentesis 12/13 positive for SBP.  On  Zosyn then switched to Rocephin.  She received Albumin 100gm IV on 12/14 and 12/15. Peritoneal albumin count was not collected therefore SAAG was not calculated.  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 Alk phos 281- >192. T. Bili 1.6 -> 1.7.  -Repeat paracentesis in 4 days to assess improvement of SBP, peritoneal labs to include cell count with differential, Gram stain, albumin, protein, LDH -Continue Rocephin 2gm IV  Q 24 hours -Continue Albumin 50gm IV Q 6 hours for now -Continue Pantoprazole 70m po QD -Oncology following   2.  Sepsis secondary to left lower lobe pneumonia and SBP.  Query ascending cholangitis component as CTAP 12/13 identified a CBD stent  ([facility] [place] jaund[place] secondary to her pancreatic mass per her daughter's report).  CTAP 12/13 also showed her pancreatic head mass to be slightly smaller when compared to prior image study. She remains afebrile. WBC25.9 -> 16.9. -Continue Rocephin -CBC, hepatic panel and PT/INR in am  3.  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  Patient is a Jehovah witness therefore she declines any blood products.  Iron 50.  Vitamin B12 greater than 7500. Today Hg 6.1. HCT 17.7. PLT 73. No overt signs of GI bleeding.   4.  Elevated creatinine level of 1.11. She is on Lasix 255mpo daily.  -Repeat BMP in a.m.  5. Hepatic steatosis   Further recommendations per Dr. NaSilverio Decamp Active Problems:   Pancreatic cancer ([facility]  Hypertension   DM (diabetes mellitus), type 2 ([facility]  Ascites   Sepsis ([facility]  Acute lower UTI   Elevated LFTs   Thrombocytopenia (HCC)   SBP (spontaneous bacterial peritonitis) ([facility]  Hypoglycemia without diagnosis of diabetes mellitus   Pressure injury of skin     LOS: 2 days   CoPatrecia Pourennedy-Smith   05/31/2020, 11:14 AM    Attending physician's note   I have taken an interval history,  reviewed the chart and examined the patient. I agree with the Advanced Practitioner's note, impression and recommendations.   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  Avoid removing >1 L of ascites fluid  Hemoglobin trended down, no signs of overt GI bleeding.  Continue to monitor and transfuse as needed to maintain >7   I have spent 35 minutes of patient care (this includes precharting, chart review, review of results, face-to-face time used for counseling as well as treatment plan and follow-up. The patient was provided an opportunity to ask questions and all were answered. The patient agreed with the plan and demonstrated an understanding of the instructions.  [name] , MD [phone]

## 2020-05-31 NOTE — TOC Progression Note (Signed)
Transition of Care [facility]) - Progression Note    Patient Details  Name: Blessings Inglett MRN: [id number] Date of Birth: March 13, 1946  [facility] of [facility] CM/SW Contact  Leeroy Cha, RN Phone Number: 05/31/2020, 10:58 AM  Clinical Narrative:    Spoke with daughter-wants to go home with [facility], will need the full service of rn,pt,ot and aide.   Expected Discharge Plan: Home w Hospice Care Barriers to Discharge: No Barriers Identified  Expected Discharge Plan and Services Expected Discharge Plan: [employer]   Discharge Planning Services: CM Consult   Living arrangements for the past 2 months: Single Family Home                                       Social Determinants of Health (SDOH) Interventions    Readmission Risk Interventions No flowsheet data found.

## 2020-05-31 NOTE — Progress Notes (Signed)
PROGRESS NOTE    Tamara Wood  YIF:[id number] DOB: November 23, 1945 DOA: 05/29/2020 PCP: Isaac Bliss, Rayford Halsted, MD    Brief Narrative:  74 year old female with history of pancreatic cancer currently on palliative chemotherapy, diabetes type 2, hypertension, Jehovah's Witness who presented with weakness, abdominal pain.  She was also having nausea and vomiting and some loose stools at home.  She was diagnosed with pancreatic cancer about a year ago and underwent Whipple procedure in March 2021 but could not complete secondary to metastatic disease and instead underwent gastrojejunal bypass.  She is currently on palliative chemotherapy,follows with Dr Burr Medico .On presentation she was hypotensive, tachycardic.  Patient was admitted for the management of severe sepsis.  Found to have pneumonia and SBP.  Assessment & Plan:   Active Problems:   Pancreatic cancer ([place])   Hypertension   DM (diabetes mellitus), type 2 (HCC)   Ascites   Sepsis ([place])   Acute lower UTI   Elevated LFTs   Thrombocytopenia (H[facility])   SBP (spontaneous bacterial peritonitis) ([place])   Hypoglycemia without diagnosis of diabetes mellitus   Pressure injury of skin   Severe sepsis: Presented with hypotension, tachycardia, lactic acidosis, elevated white cell counts.  Ascitic fluid analysis suggestive of SBP .imaging showed left middle/lower zone infiltrate suspicios for pneumonia. Pt is continued on broad spec abx. WBC down to 16.9  SBP: Found to have ascites.  He underwent diagnostic paracentesis in the emergency department with finding of SBP(total neutrophils of 9450).  Continue current antibiotics for now in order to cover for pneumonia as well.  No growth thus far x 2 days  Acute hypoxic respiratory failure: Currently requiring 2 L of oxygen per minute.  Chest imaging showed bilateral pleural effusions with compression atelectasis, possibility of infiltrate on the left lung.  Continue supplemental oxygen.  Cont to wean O2  as tolerated  Advanced pancreatic cancer: Follows with oncology, Dr. Burr Medico.  Currently on palliative chemotherapy.  CT imagings did not show progression of her malignancy, showed hepatic steatosis.  She is status post biliary stent.She  has elevated ALP and mild elevated liver enzymes along with total bilirubin.  Last chemo was on 12/2.  Oncology cont to follow. Cytology of ascitic fluid pending  Anasarca: Presented with abdominal distention,severe lower extremity edema, bilateral pleural effusion.  Most likely this is from hypoalbuminemia.  Diagnostic paracentesis was done in the emergency department.  Pending cytology of ascitic fluid. Cont on lasix as tolerated  Thrombocytopenia: Suspect secondary to recent chemo. Slowly trending down  Macrocytic anemia: Hemoglobin in the range of 8.  No evidence of acute blood loss. Will check retic count. Pt cannot receive blood given religous beliefs. Consider epo. Will d/w Oncology  Hypomagnesemia: Being supplemented with Mg. Mg levels normalized  UTI: She was recently started treatment for urinary tract infection and was on Cipro.  Urine cx pos for ecoli species that are pan-sensitive with exception of ampicillin and bactrim  Severe protein calorie malnutrition: Has severe hypoalbuminemia.  Nutrition consulted.  IV albumin was given for SBP.  Hypoglycemia: Continue to monitor CBGs.  DVT prophylaxis: SCD's Code Status: No intubation Family Communication: Pt in room, pt's daughter-in-law at bedside  Status is: Inpatient  Remains inpatient appropriate because:Hemodynamically unstable, Ongoing diagnostic testing needed not appropriate for outpatient work up, IV treatments appropriate due to intensity of illness or inability to take PO and Inpatient level of care appropriate due to severity of illness   Dispo: The patient is from: Home  Anticipated d/c is to: Home              Anticipated d/c date is: 3 days              Patient  currently is not medically stable to d/c.   Consultants:   Oncology  GI  General Surgery  Procedures:   Paracentesis  Antimicrobials: Anti-infectives (From admission, onward)   Start     Dose/Rate Route Frequency Ordered Stop   05/30/20 1600  vancomycin (VANCOCIN) IVPB 1000 mg/200 mL premix  Status:  Discontinued        1,000 mg 200 mL/hr over 60 Minutes Intravenous Every 24 hours 05/29/20 2021 05/30/20 1058   05/30/20 1445  cefTRIAXone (ROCEPHIN) 2 g in sodium chloride 0.9 % 100 mL IVPB        2 g 200 mL/hr over 30 Minutes Intravenous Every 24 hours 05/30/20 1354     05/29/20 2200  piperacillin-tazobactam (ZOSYN) IVPB 3.375 g  Status:  Discontinued        3.375 g 12.5 mL/hr over 240 Minutes Intravenous Every 8 hours 05/29/20 2026 05/30/20 1354   05/29/20 1600  vancomycin (VANCOREADY) IVPB 1250 mg/250 mL        1,250 mg 166.7 mL/hr over 90 Minutes Intravenous  Once 05/29/20 1551 05/29/20 2057   05/29/20 1545  piperacillin-tazobactam (ZOSYN) IVPB 3.375 g        3.375 g 100 mL/hr over 30 Minutes Intravenous  Once 05/29/20 1533 05/29/20 1635   05/29/20 1230  azithromycin (ZITHROMAX) 500 mg in sodium chloride 0.9 % 250 mL IVPB        500 mg 250 mL/hr over 60 Minutes Intravenous  Once 05/29/20 1228 05/29/20 1555   05/29/20 1015  cefTRIAXone (ROCEPHIN) 1 g in sodium chloride 0.9 % 100 mL IVPB  Status:  Discontinued        1 g 200 mL/hr over 30 Minutes Intravenous Every 24 hours 05/29/20 1014 05/29/20 2026       Subjective: Without complaints this AM  Objective: Vitals:   05/31/20 0600 05/31/20 0800 05/31/20 1000 05/31/20 1218  BP: (!) 124/55  (!) 123/54   Pulse: 76  (!) 103   Resp: 16     Temp:  (!) 97.3 F (36.3 C)  97.9 F (36.6 C)  TempSrc:  Oral  Oral  SpO2: 98%  97%   Weight:      Height:        Intake/Output Summary (Last 24 hours) at 05/31/2020 1527 Last data filed at 05/31/2020 0932 Gross per 24 hour  Intake 360 ml  Output --  Net 360 ml   Filed  Weights   05/29/20 0911 05/30/20 0127  Weight: 56.2 kg 67.9 kg    Examination: General exam: Appears calm and comfortable  Respiratory system: Clear to auscultation. Respiratory effort normal. Cardiovascular system: S1 & S2 heard, Regular Gastrointestinal system: Abdomen is nondistended, soft and nontender. No organomegaly or masses felt. Normal bowel sounds heard. Central nervous system: Alert and oriented. No focal neurological deficits. Extremities: Symmetric 5 x 5 power. Skin: No rashes, lesions Psychiatry: Judgement and insight appear normal. Mood & affect appropriate.   Data Reviewed: I have personally reviewed following labs and imaging studies  CBC: Recent Labs  Lab 05/29/20 1013 05/30/20 0607 05/31/20 0554  WBC 26.2* 25.9* 16.9*  NEUTROABS 22.5* 22.5* 14.8*  HGB 10.2* 8.5* 6.1*  HCT 29.6* 24.5* 17.7*  MCV 99.7 101.2* 101.1*  PLT 71* 87* 73*   Basic Metabolic  Panel: Recent Labs  Lab 05/29/20 1013 05/30/20 0607 05/31/20 0554  NA 136 137 140  K 3.9 4.5 3.5  CL 106 108 107  CO2 19* 20* 23  GLUCOSE 59* 86 104*  BUN 14 14 17   CREATININE 0.89 0.95 1.11*  CALCIUM 7.5* 7.0* 7.7*  MG  --  1.1* 2.0  PHOS  --  3.2  --    GFR: Estimated Creatinine Clearance: 41.5 mL/min (A) (by C-G formula based on SCr of 1.11 mg/dL (H)). Liver Function Tests: Recent Labs  Lab 05/29/20 1013 05/30/20 0607 05/31/20 0554  AST 85* 73* 54*  ALT 31 29 22   ALKPHOS 373* 281* 192*  BILITOT 1.7* 1.6* 1.7*  PROT 5.1* 4.3* 4.8*  ALBUMIN 1.7* 1.4* 3.0*   No results for input(s): LIPASE, AMYLASE in the last 168 hours. Recent Labs  Lab 05/29/20 2030  AMMONIA 36*   Coagulation Profile: Recent Labs  Lab 05/29/20 1013  INR 2.5*   Cardiac Enzymes: No results for input(s): CKTOTAL, CKMB, CKMBINDEX, TROPONINI in the last 168 hours. BNP (last 3 results) No results for input(s): PROBNP in the last 8760 hours. HbA1C: Recent Labs    05/30/20 0607  HGBA1C 6.0*   CBG: Recent Labs   Lab 05/30/20 1951 05/30/20 2350 05/31/20 0333 05/31/20 0757 05/31/20 1220  GLUCAP 142* 133* 111* 94 124*   Lipid Profile: No results for input(s): CHOL, HDL, LDLCALC, TRIG, CHOLHDL, LDLDIRECT in the last 72 hours. Thyroid Function Tests: Recent Labs    05/30/20 0607  TSH 1.664   Anemia Panel: Recent Labs    05/30/20 0600 05/31/20 0938 05/31/20 0939  VITAMINB12 >7,500* >7,500*  --   FOLATE 7.3  --  5.7*  TIBC  --  NOT CALCULATED  --   IRON  --  50  --    Sepsis Labs: Recent Labs  Lab 05/29/20 2030 05/29/20 2232 05/30/20 0300 05/30/20 0607  PROCALCITON 4.60  --   --   --   LATICACIDVEN 5.0* 4.9* 3.8* 2.7*    Recent Results (from the past 240 hour(s))  Blood Culture (routine x 2)     Status: None (Preliminary result)   Collection Time: 05/29/20 11:00 AM   Specimen: BLOOD  Result Value Ref Range Status   Specimen Description   Final    BLOOD LEFT ANTECUBITAL Performed at [facility], [place] [address], [place], [place] [address]    Special Requests   Final    BOTTLES DRAWN AEROBIC AND ANAEROBIC Blood Culture results may not be optimal due to an inadequate volume of blood received in culture bottles Performed at [facility] [address], [place], [place] [address]    Culture   Final    NO GROWTH 2 DAYS Performed at [facility] [address] [place], [place] [address]    Report Status PENDING  Incomplete  Blood Culture (routine x 2)     Status: None (Preliminary result)   Collection Time: 05/29/20 11:12 AM   Specimen: BLOOD  Result Value Ref Range Status   Specimen Description   Final    BLOOD RIGHT ANTECUBITAL Performed at [facility] [address] [place], [place] [address]    Special Requests   Final    BOTTLES DRAWN AEROBIC AND ANAEROBIC Blood Culture adequate volume Performed at [facility] [address], [place], [place] [address]    Culture   Final     NO GROWTH 2 DAYS Performed  at [facility], [place] [address] [place], [place] [address]    Report Status PENDING  Incomplete  Resp Panel by RT-PCR (Flu A&B, Covid) Nasopharyngeal Swab     Status: None   Collection Time: 05/29/20 11:17 AM   Specimen: Nasopharyngeal Swab; Nasopharyngeal(NP) swabs in vial transport medium  Result Value Ref Range Status   SARS Coronavirus 2 by RT PCR NEGATIVE NEGATIVE Final    Comment: (NOTE) SARS-CoV-2 target nucleic acids are NOT DETECTED.  The SARS-CoV-2 RNA is generally detectable in upper respiratory specimens during the acute phase of infection. The lowest concentration of SARS-CoV-2 viral copies this assay can detect is 138 copies/mL. A negative result does not preclude SARS-Cov-2 infection and should not be used as the sole basis for treatment or other patient management decisions. A negative result may occur with  improper specimen collection/handling, submission of specimen other than nasopharyngeal swab, presence of viral mutation(s) within the areas targeted by this assay, and inadequate number of viral copies(<138 copies/mL). A negative result must be combined with clinical observations, patient history, and epidemiological information. The expected result is Negative.  Fact Sheet for Patients:  EntrepreneurPulse.com.au  Fact Sheet for Healthcare Providers:  IncredibleEmployment.be  This test is no t yet approved or cleared by the [place] FDA and  has been authorized for detection and/or diagnosis of SARS-CoV-2 by FDA under an Emergency Use Authorization (EUA). This EUA will remain  in effect (meaning this test can be used) for the duration of the COVID-19 declaration under Section 564(b)(1) of the Act, 21 U.S.C.section 360bbb-3(b)(1), unless the authorization is terminated  or revoked sooner.       Influenza A by PCR NEGATIVE NEGATIVE Final   Influenza B by PCR NEGATIVE NEGATIVE Final     Comment: (NOTE) The Xpert Xpress SARS-CoV-2/FLU/RSV plus assay is intended as an aid in the diagnosis of influenza from Nasopharyngeal swab specimens and should not be used as a sole basis for treatment. Nasal washings and aspirates are unacceptable for Xpert Xpress SARS-CoV-2/FLU/RSV testing.  Fact Sheet for Patients: EntrepreneurPulse.com.au  Fact Sheet for Healthcare Providers: IncredibleEmployment.be  This test is not yet approved or cleared by the [place] FDA and has been authorized for detection and/or diagnosis of SARS-CoV-2 by FDA under an Emergency Use Authorization (EUA). This EUA will remain in effect (meaning this test can be used) for the duration of the COVID-19 declaration under Section 564(b)(1) of the Act, 21 U.S.C. section 360bbb-3(b)(1), unless the authorization is terminated or revoked.  Performed at [facility], [place] [address] [place], [place] [address]   Urine culture     Status: None   Collection Time: 05/29/20 12:48 PM   Specimen: In/Out Cath Urine  Result Value Ref Range Status   Specimen Description   Final    IN/OUT CATH URINE Performed at [facility] [address], [place], [place] [address]    Special Requests   Final    NONE Performed at [facility], [place] [address] [place], [place] [address]    Culture   Final    NO GROWTH Performed at [facility], [place] [address], [place], [place] [address]    Report Status 05/30/2020 FINAL  Final  Body fluid culture (includes gram stain)     Status: None (Preliminary result)   Collection Time: 05/29/20  4:34 PM   Specimen: Peritoneal Washings; Peritoneal Fluid  Result Value Ref Range Status   Specimen Description  Final    PERITONEAL FLUID Performed at [facility], [place] [address], [place], [place] [address]    Special Requests   Final    NONE Performed at [facility], [place] [address] [place], [place] [address]    Gram Stain   Final    FEW WBC PRESENT, PREDOMINANTLY MONONUCLEAR NO ORGANISMS SEEN    Culture   Final    NO GROWTH 2 DAYS Performed at [facility] [address] [place], [place] [address]    Report Status PENDING  Incomplete  MRSA PCR Screening     Status: None   Collection Time: 05/30/20  1:26 AM   Specimen: Nasal Mucosa; Nasopharyngeal  Result Value Ref Range Status   MRSA by PCR NEGATIVE NEGATIVE Final    Comment:        The GeneXpert MRSA Assay (FDA approved for NASAL specimens only), is one component of a comprehensive MRSA colonization surveillance program. It is not intended to diagnose MRSA infection nor to guide or monitor treatment for MRSA infections. Performed at [facility], [place] [address], [place],  [address]      Radiology Studies: No results found.  Scheduled Meds: . (feeding supplement) PROSource Plus  30 mL Oral BID BM  . vitamin C  500 mg Oral Daily  . Chlorhexidine Gluconate Cloth  6 each Topical Q0600  . furosemide  20 mg Oral Daily  . hydrocortisone  1 application Rectal BID  . insulin aspart  0-9 Units Subcutaneous Q4H  . lidocaine-EPINEPHrine  10 mL Infiltration Once  . mouth rinse  15 mL Mouth Rinse BID  . multivitamin with minerals  1 tablet Oral Daily  . nutrition supplement (JUVEN)  1 packet Oral BID BM  . pantoprazole  20 mg Oral Daily  . zinc sulfate  220 mg Oral Daily   Continuous Infusions: . cefTRIAXone (ROCEPHIN)  IV 2 g (05/31/20 1431)     LOS: 2 days   [name], MD [facility] Pager On Amion  If 7PM-7AM, please contact night-coverage 05/31/2020, 3:27 PM

## 2020-06-01 ENCOUNTER — Inpatient Hospital Stay: Payer: 59

## 2020-06-01 ENCOUNTER — Inpatient Hospital Stay: Payer: 59 | Admitting: Hematology

## 2020-06-01 ENCOUNTER — Ambulatory Visit (HOSPITAL_COMMUNITY): Payer: 59

## 2020-06-01 DIAGNOSIS — K7290 Hepatic failure, unspecified without coma: Secondary | ICD-10-CM

## 2020-06-01 DIAGNOSIS — R791 Abnormal coagulation profile: Secondary | ICD-10-CM

## 2020-06-01 DIAGNOSIS — R1013 Epigastric pain: Secondary | ICD-10-CM

## 2020-06-01 LAB — COMPREHENSIVE METABOLIC PANEL
ALT: 18 U/L (ref 0–44)
AST: 49 U/L — ABNORMAL HIGH (ref 15–41)
Albumin: 2.7 g/dL — ABNORMAL LOW (ref 3.5–5.0)
Alkaline Phosphatase: 275 U/L — ABNORMAL HIGH (ref 38–126)
Anion gap: 11 (ref 5–15)
BUN: 18 mg/dL (ref 8–23)
CO2: 22 mmol/L (ref 22–32)
Calcium: 7.6 mg/dL — ABNORMAL LOW (ref 8.9–10.3)
Chloride: 108 mmol/L (ref 98–111)
Creatinine, Ser: 0.7 mg/dL (ref 0.44–1.00)
GFR, Estimated: >60 mL/min (ref >60)
Glucose, Bld: 149 mg/dL — ABNORMAL HIGH (ref 70–99)
Potassium: 2.6 mmol/L — CL (ref 3.5–5.1)
Sodium: 141 mmol/L (ref 135–145)
Total Bilirubin: 1.9 mg/dL — ABNORMAL HIGH (ref 0.3–1.2)
Total Protein: 4.7 g/dL — ABNORMAL LOW (ref 6.5–8.1)

## 2020-06-01 LAB — GLUCOSE, CAPILLARY
Glucose-Capillary: 84 mg/dL (ref 70–99)
Glucose-Capillary: 79 mg/dL (ref 70–99)
Glucose-Capillary: 111 mg/dL — ABNORMAL HIGH (ref 70–99)
Glucose-Capillary: 161 mg/dL — ABNORMAL HIGH (ref 70–99)
Glucose-Capillary: 141 mg/dL — ABNORMAL HIGH (ref 70–99)
Glucose-Capillary: 94 mg/dL (ref 70–99)

## 2020-06-01 LAB — CREATININE, SERUM
Creatinine, Ser: 0.64 mg/dL (ref 0.44–1.00)
GFR, Estimated: >60 mL/min (ref >60)

## 2020-06-01 LAB — MAGNESIUM: Magnesium: 1.4 mg/dL — ABNORMAL LOW (ref 1.7–2.4)

## 2020-06-01 MED ORDER — METOPROLOL TARTRATE 25 MG PO TABS
12.5000 mg | ORAL_TABLET | Freq: Two times a day (BID) | ORAL | Status: DC
  Administered 2020-06-01 – 2020-06-08 (×14): 12.5 mg via ORAL
  Filled 2020-06-01 (×14): qty 1

## 2020-06-01 MED ORDER — ALBUMIN HUMAN 25 % IV SOLN
25.0000 g | Freq: Once | INTRAVENOUS | Status: AC
  Administered 2020-06-02: 17:00:00 25 g via INTRAVENOUS
  Filled 2020-06-01: qty 100

## 2020-06-01 MED ORDER — SODIUM CHLORIDE 0.9% FLUSH: 10.0000 mL | INTRAVENOUS | Status: DC | PRN

## 2020-06-01 MED ORDER — SODIUM CHLORIDE 0.9% FLUSH
10.0000 mL | Freq: Two times a day (BID) | INTRAVENOUS | Status: DC
  Administered 2020-06-01 – 2020-06-08 (×11): 10 mL

## 2020-06-01 MED ORDER — POTASSIUM CHLORIDE 10 MEQ/100ML IV SOLN
10.0000 meq | INTRAVENOUS | Status: AC
  Administered 2020-06-01 – 2020-06-02 (×5): 10 meq via INTRAVENOUS
  Filled 2020-06-01 (×5): qty 100

## 2020-06-01 NOTE — Progress Notes (Signed)
PROGRESS NOTE    Tamara Wood  TMH:[id number] DOB: [birth year] DOA: 05/29/2020 PCP: Isaac Bliss, Rayford Halsted, MD    Brief Narrative:  74 year old female with history of pancreatic cancer currently on palliative chemotherapy, diabetes type 2, hypertension, Jehovah's Witness who presented with weakness, abdominal pain.  She was also having nausea and vomiting and some loose stools at home.  She was diagnosed with pancreatic cancer about a year ago and underwent Whipple procedure in March 2021 but could not complete secondary to metastatic disease and instead underwent gastrojejunal bypass.  She is currently on palliative chemotherapy,follows with Dr Burr Medico .On presentation she was hypotensive, tachycardic.  Patient was admitted for the management of severe sepsis.  Found to have pneumonia and SBP.  Assessment & Plan:   Active Problems:   Pancreatic cancer ([place])   Hypertension   DM (diabetes mellitus), type 2 (HCC)   Ascites   Sepsis ([place])   Acute lower UTI   Elevated LFTs   Thrombocytopenia (HCC)   SBP (spontaneous bacterial peritonitis) ([place])   Hypoglycemia without diagnosis of diabetes mellitus   Pressure injury of skin   Abdominal pain   Severe sepsis: Presented with hypotension, tachycardia, lactic acidosis, elevated white cell counts.  Ascitic fluid analysis suggestive of SBP .imaging showed left middle/lower zone infiltrate suspicios for pneumonia. Pt is continued on broad spec abx. Most recent WBC down to 16.9k  SBP: Found to have ascites.  He underwent diagnostic paracentesis in the emergency department with finding of SBP(total neutrophils of 9450).  Continue current antibiotics for now in order to cover for pneumonia as well.  No growth thus far x 3 days  Acute hypoxic respiratory failure: Currently remains on 2 L of oxygen per minute.  Chest imaging showed bilateral pleural effusions with compression atelectasis, possibility of infiltrate on the left lung.  Continue  supplemental oxygen.  Cont to wean O2 as pt tolerates  Advanced pancreatic cancer: Follows with oncology, Dr. Burr Medico.  Currently on palliative chemotherapy.  CT imagings did not show progression of her malignancy, showed hepatic steatosis.  She is status post biliary stent.She  has elevated ALP and mild elevated liver enzymes along with total bilirubin.  Last chemo was on 12/2.  Oncology cont to follow. Cytology of ascitic fluid remains pending  Anasarca: Presented with abdominal distention,severe lower extremity edema, bilateral pleural effusion.  Most likely this is from hypoalbuminemia.  Diagnostic paracentesis was done in the emergency department.  Pending cytology of ascitic fluid. Cont on lasix as tolerated  Plan for repeat paracentesis 12/17  Thrombocytopenia: Suspect secondary to recent chemo. Slowly trending down. No evidence of acute blood loss  Macrocytic anemia: Hemoglobin in the range of 8.  No evidence of acute blood loss. Will check retic count. Pt cannot receive blood given religous beliefs. Oncology is following and pt received aranesp on 12/15  Hypomagnesemia: Being supplemented with Mg. Mg levels normalized  UTI: She was recently started treatment for urinary tract infection and was on Cipro.  Urine cx pos for ecoli species that are pan-sensitive with exception of ampicillin and bactrim. Afebrile  Severe protein calorie malnutrition: Has severe hypoalbuminemia.  Nutrition consulted.  IV albumin was given for SBP.  Hypoglycemia: Continue to monitor CBGs.  DVT prophylaxis: SCD's Code Status: No intubation Family Communication: Pt in room, pt's daughter-in-law at bedside  Status is: Inpatient  Remains inpatient appropriate because:Hemodynamically unstable, Ongoing diagnostic testing needed not appropriate for outpatient work up, IV treatments appropriate due to intensity of illness or inability to  take PO and Inpatient level of care appropriate due to severity of  illness   Dispo: The patient is from: Home              Anticipated d/c is to: Home              Anticipated d/c date is: 3 days              Patient currently is not medically stable to d/c.   Consultants:   Oncology  GI  General Surgery  Procedures:   Paracentesis  Antimicrobials: Anti-infectives (From admission, onward)   Start     Dose/Rate Route Frequency Ordered Stop   05/30/20 1600  vancomycin (VANCOCIN) IVPB 1000 mg/200 mL premix  Status:  Discontinued        1,000 mg 200 mL/hr over 60 Minutes Intravenous Every 24 hours 05/29/20 2021 05/30/20 1058   05/30/20 1445  cefTRIAXone (ROCEPHIN) 2 g in sodium chloride 0.9 % 100 mL IVPB        2 g 200 mL/hr over 30 Minutes Intravenous Every 24 hours 05/30/20 1354     05/29/20 2200  piperacillin-tazobactam (ZOSYN) IVPB 3.375 g  Status:  Discontinued        3.375 g 12.5 mL/hr over 240 Minutes Intravenous Every 8 hours 05/29/20 2026 05/30/20 1354   05/29/20 1600  vancomycin (VANCOREADY) IVPB 1250 mg/250 mL        1,250 mg 166.7 mL/hr over 90 Minutes Intravenous  Once 05/29/20 1551 05/29/20 2057   05/29/20 1545  piperacillin-tazobactam (ZOSYN) IVPB 3.375 g        3.375 g 100 mL/hr over 30 Minutes Intravenous  Once 05/29/20 1533 05/29/20 1635   05/29/20 1230  azithromycin (ZITHROMAX) 500 mg in sodium chloride 0.9 % 250 mL IVPB        500 mg 250 mL/hr over 60 Minutes Intravenous  Once 05/29/20 1228 05/29/20 1555   05/29/20 1015  cefTRIAXone (ROCEPHIN) 1 g in sodium chloride 0.9 % 100 mL IVPB  Status:  Discontinued        1 g 200 mL/hr over 30 Minutes Intravenous Every 24 hours 05/29/20 1014 05/29/20 2026      Subjective: No complaints this AM. Denies sob  Objective: Vitals:   06/01/20 0600 06/01/20 0700 06/01/20 0800 06/01/20 1200  BP: (!) 149/73  139/60 (!) 146/85  Pulse: 81  81 98  Resp: (!) 30  (!) 25 20  Temp:  98 F (36.7 C)  98.2 F (36.8 C)  TempSrc:  Oral  Oral  SpO2: 99%  100% 100%  Weight:      Height:         Intake/Output Summary (Last 24 hours) at 06/01/2020 1342 Last data filed at 06/01/2020 1023 Gross per 24 hour  Intake 110 ml  Output --  Net 110 ml   Filed Weights   05/29/20 0911 05/30/20 0127  Weight: 56.2 kg 67.9 kg    Examination: General exam: Awake, laying in bed, in nad Respiratory system: Normal respiratory effort, no wheezing Cardiovascular system: regular rate, s1, s2 Gastrointestinal system: Soft, nondistended, positive BS Central nervous system: CN2-12 grossly intact, strength intact Extremities: Perfused, no clubbing Skin: Normal skin turgor, no notable skin lesions seen Psychiatry: Mood normal // no visual hallucinations   Data Reviewed: I have personally reviewed following labs and imaging studies  CBC: Recent Labs  Lab 05/29/20 1013 05/30/20 0607 05/31/20 0554  WBC 26.2* 25.9* 16.9*  NEUTROABS 22.5* 22.5* 14.8*  HGB 10.2*  8.5* 6.1*  HCT 29.6* 24.5* 17.7*  MCV 99.7 101.2* 101.1*  PLT 71* 87* 73*   Basic Metabolic Panel: Recent Labs  Lab 05/29/20 1013 05/30/20 0607 05/31/20 0554 06/01/20 0500  NA 136 137 140  --   K 3.9 4.5 3.5  --   CL 106 108 107  --   CO2 19* 20* 23  --   GLUCOSE 59* 86 104*  --   BUN 14 14 17   --   CREATININE 0.89 0.95 1.11* 0.64  CALCIUM 7.5* 7.0* 7.7*  --   MG  --  1.1* 2.0  --   PHOS  --  3.2  --   --    GFR: Estimated Creatinine Clearance: 57.6 mL/min (by C-G formula based on SCr of 0.64 mg/dL). Liver Function Tests: Recent Labs  Lab 05/29/20 1013 05/30/20 0607 05/31/20 0554  AST 85* 73* 54*  ALT 31 29 22   ALKPHOS 373* 281* 192*  BILITOT 1.7* 1.6* 1.7*  PROT 5.1* 4.3* 4.8*  ALBUMIN 1.7* 1.4* 3.0*   No results for input(s): LIPASE, AMYLASE in the last 168 hours. Recent Labs  Lab 05/29/20 2030  AMMONIA 36*   Coagulation Profile: Recent Labs  Lab 05/29/20 1013  INR 2.5*   Cardiac Enzymes: No results for input(s): CKTOTAL, CKMB, CKMBINDEX, TROPONINI in the last 168 hours. BNP (last 3  results) No results for input(s): PROBNP in the last 8760 hours. HbA1C: Recent Labs    05/30/20 0607  HGBA1C 6.0*   CBG: Recent Labs  Lab 05/31/20 1945 05/31/20 2258 06/01/20 0400 06/01/20 0734 06/01/20 1147  GLUCAP 137* 109* 84 79 111*   Lipid Profile: No results for input(s): CHOL, HDL, LDLCALC, TRIG, CHOLHDL, LDLDIRECT in the last 72 hours. Thyroid Function Tests: Recent Labs    05/30/20 0607  TSH 1.664   Anemia Panel: Recent Labs    05/30/20 0600 05/31/20 0938 05/31/20 0939 05/31/20 1647  VITAMINB12 >7,500* >7,500*  --   --   FOLATE 7.3  --  5.7*  --   TIBC  --  NOT CALCULATED  --   --   IRON  --  50  --   --   RETICCTPCT  --   --   --  4.8*   Sepsis Labs: Recent Labs  Lab 05/29/20 2030 05/29/20 2232 05/30/20 0300 05/30/20 0607  PROCALCITON 4.60  --   --   --   LATICACIDVEN 5.0* 4.9* 3.8* 2.7*    Recent Results (from the past 240 hour(s))  Blood Culture (routine x 2)     Status: None (Preliminary result)   Collection Time: 05/29/20 11:00 AM   Specimen: BLOOD  Result Value Ref Range Status   Specimen Description   Final    BLOOD LEFT ANTECUBITAL Performed at [facility], [place] [address] [place], [place] [address]    Special Requests   Final    BOTTLES DRAWN AEROBIC AND ANAEROBIC Blood Culture results may not be optimal due to an inadequate volume of blood received in culture bottles Performed at [facility] [address], [place], [place] [address]    Culture   Final    NO GROWTH 2 DAYS Performed at [facility] [address], [place],  [address]    Report Status PENDING  Incomplete  Blood Culture (routine x 2)     Status: None (Preliminary result)   Collection Time: 05/29/20 11:12 AM   Specimen: BLOOD  Result Value Ref Range Status  Specimen Description   Final    BLOOD RIGHT ANTECUBITAL Performed at [facility] [address] [place], [place] [address]     Special Requests   Final    BOTTLES DRAWN AEROBIC AND ANAEROBIC Blood Culture adequate volume Performed at [facility] [address], [place], [place] [address]    Culture   Final    NO GROWTH 2 DAYS Performed at [facility] [address] [place], [place] [address]    Report Status PENDING  Incomplete  Resp Panel by RT-PCR (Flu A&B, Covid) Nasopharyngeal Swab     Status: None   Collection Time: 05/29/20 11:17 AM   Specimen: Nasopharyngeal Swab; Nasopharyngeal(NP) swabs in vial transport medium  Result Value Ref Range Status   SARS Coronavirus 2 by RT PCR NEGATIVE NEGATIVE Final    Comment: (NOTE) SARS-CoV-2 target nucleic acids are NOT DETECTED.  The SARS-CoV-2 RNA is generally detectable in upper respiratory specimens during the acute phase of infection. The lowest concentration of SARS-CoV-2 viral copies this assay can detect is 138 copies/mL. A negative result does not preclude SARS-Cov-2 infection and should not be used as the sole basis for treatment or other patient management decisions. A negative result may occur with  improper specimen collection/handling, submission of specimen other than nasopharyngeal swab, presence of viral mutation(s) within the areas targeted by this assay, and inadequate number of viral copies(<138 copies/mL). A negative result must be combined with clinical observations, patient history, and epidemiological information. The expected result is Negative.  Fact Sheet for Patients:  EntrepreneurPulse.com.au  Fact Sheet for Healthcare Providers:  IncredibleEmployment.be  This test is no t yet approved or cleared by the [place] FDA and  has been authorized for detection and/or diagnosis of SARS-CoV-2 by FDA under an Emergency Use Authorization (EUA). This EUA will remain  in effect (meaning this test can be used) for the duration of the COVID-19 declaration under Section  564(b)(1) of the Act, 21 U.S.C.section 360bbb-3(b)(1), unless the authorization is terminated  or revoked sooner.       Influenza A by PCR NEGATIVE NEGATIVE Final   Influenza B by PCR NEGATIVE NEGATIVE Final    Comment: (NOTE) The Xpert Xpress SARS-CoV-2/FLU/RSV plus assay is intended as an aid in the diagnosis of influenza from Nasopharyngeal swab specimens and should not be used as a sole basis for treatment. Nasal washings and aspirates are unacceptable for Xpert Xpress SARS-CoV-2/FLU/RSV testing.  Fact Sheet for Patients: EntrepreneurPulse.com.au  Fact Sheet for Healthcare Providers: IncredibleEmployment.be  This test is not yet approved or cleared by the [place] FDA and has been authorized for detection and/or diagnosis of SARS-CoV-2 by FDA under an Emergency Use Authorization (EUA). This EUA will remain in effect (meaning this test can be used) for the duration of the COVID-19 declaration under Section 564(b)(1) of the Act, 21 U.S.C. section 360bbb-3(b)(1), unless the authorization is terminated or revoked.  Performed at [facility], [place] [address], [place], [place] [address]   Urine culture     Status: None   Collection Time: 05/29/20 12:48 PM   Specimen: In/Out Cath Urine  Result Value Ref Range Status   Specimen Description   Final    IN/OUT CATH URINE Performed at [facility] [address], [place], [place] [address]    Special Requests   Final    NONE Performed at [facility], [place] [address], [place], [place] [address]  Culture   Final    NO GROWTH Performed at [facility], [place] [address], [place], [place] [address]    Report Status 05/30/2020 FINAL  Final  Body fluid culture (includes gram stain)     Status: None (Preliminary result)   Collection Time: 05/29/20  4:34 PM   Specimen: Peritoneal Washings; Peritoneal Fluid  Result Value Ref Range  Status   Specimen Description   Final    PERITONEAL FLUID Performed at [facility], [place] [address], [place], [place] [address]    Special Requests   Final    NONE Performed at [facility], [place] [address], [place], [place] [address]    Gram Stain   Final    FEW WBC PRESENT, PREDOMINANTLY MONONUCLEAR NO ORGANISMS SEEN    Culture   Final    NO GROWTH 3 DAYS Performed at [facility] [address], [place], [place] [address]    Report Status PENDING  Incomplete  MRSA PCR Screening     Status: None   Collection Time: 05/30/20  1:26 AM   Specimen: Nasal Mucosa; Nasopharyngeal  Result Value Ref Range Status   MRSA by PCR NEGATIVE NEGATIVE Final    Comment:        The GeneXpert MRSA Assay (FDA approved for NASAL specimens only), is one component of a comprehensive MRSA colonization surveillance program. It is not intended to diagnose MRSA infection nor to guide or monitor treatment for MRSA infections. Performed at [facility], [place] [address] [place], [place] [address]      Radiology Studies: No results found.  Scheduled Meds: . (feeding supplement) PROSource Plus  30 mL Oral BID BM  . vitamin C  500 mg Oral Daily  . Chlorhexidine Gluconate Cloth  6 each Topical Q0600  . furosemide  20 mg Oral Daily  . hydrocortisone  1 application Rectal BID  . insulin aspart  0-9 Units Subcutaneous Q4H  . mouth rinse  15 mL Mouth Rinse BID  . multivitamin with minerals  1 tablet Oral Daily  . nutrition supplement (JUVEN)  1 packet Oral BID BM  . pantoprazole  20 mg Oral Daily  . sodium chloride flush  10-40 mL Intracatheter Q12H  . zinc sulfate  220 mg Oral Daily   Continuous Infusions: . [START ON 06/02/2020] albumin human    . cefTRIAXone (ROCEPHIN)  IV Stopped (05/31/20 1530)     LOS: 3 days   [name], MD [facility] Pager On Amion  If 7PM-7AM, please contact night-coverage 06/01/2020, 1:42 PM

## 2020-06-01 NOTE — Progress Notes (Signed)
Physical Therapy Treatment Patient Details Name: Tamara Wood MRN: [id number] DOB: 06-11-1946 Today's Date: 06/01/2020    History of Present Illness Patient is a 74 year old  female with PMH of Pancreatic cancer, DM2, HTN, admitted with abdominal pain and increased fatigue. Patient diagnosed with severe sepsis, PNA and spontaneous bacterial peritonitis    PT Comments    Pt declined mobility, she is fatigued from OT session earlier today. She agreed to bed level exercises for BUE/LEs. She put forth good effort. HR 114-121 with activity.   Follow Up Recommendations  Home health PT     Equipment Recommendations  None recommended by PT    Recommendations for Other Services       Precautions / Restrictions Precautions Precautions: Fall Precaution Comments: monitor BP, loose and frequent BMs (family has Depends if needed) Restrictions Weight Bearing Restrictions: No    Mobility  Bed Mobility Overal bed mobility: Needs Assistance Bed Mobility: Rolling;Supine to Sit Rolling: Min assist   Supine to sit: Mod assist;HOB elevated     General bed mobility comments: pt declined mobility, she's fatigued from mobility with OT earlier today  Transfers Overall transfer level: Needs assistance Equipment used: Rolling walker (2 wheeled) Transfers: Sit to/from [facility] Sit to Stand: Min assist;+2 safety/equipment;From elevated surface Stand pivot transfers: Min assist;+2 safety/equipment       General transfer comment: Min assist x 2 (for safety) for sit to stand from elevated height. Mod assist x 2 (physical assistance) from low height.  Ambulation/Gait                 Stairs             Wheelchair Mobility    Modified Rankin (Stroke Patients Only)       Balance Overall balance assessment: Needs assistance Sitting-balance support: No upper extremity supported;Feet unsupported Sitting balance-Leahy Scale: Fair     Standing balance  support: Bilateral upper extremity supported;During functional activity Standing balance-Leahy Scale: Poor                              Cognition Arousal/Alertness: Awake/alert Behavior During Therapy: WFL for tasks assessed/performed Overall Cognitive Status: Within Functional Limits for tasks assessed                                        Exercises Total Joint Exercises Towel Squeeze: AROM;Both;10 reps;Supine General Exercises - Upper Extremity Shoulder Flexion: AROM;Both;10 reps;Supine General Exercises - Lower Extremity Ankle Circles/Pumps: AROM;Both;10 reps;Supine Short Arc Quad: AROM;Both;10 reps;Supine Heel Slides: AROM;Both;10 reps;Supine Hip ABduction/ADduction: AROM;Both;10 reps;Supine    General Comments        Pertinent Vitals/Pain Pain Assessment: No/denies pain    Home Living                      Prior Function            PT Goals (current goals can now be found in the care plan section) Acute Rehab PT Goals Patient Stated Goal: "she walked more than this before her feet got swollen" PT Goal Formulation: With patient/family Time For Goal Achievement: 06/13/20 Potential to Achieve Goals: Fair Progress towards PT goals: Progressing toward goals    Frequency    Min 3X/week      PT Plan Current plan remains appropriate    Co-evaluation PT/OT/SLP  Co-Evaluation/Treatment: Yes     OT goals addressed during session: ADL's and self-care (functional mobility)      AM-PAC PT "6 Clicks" Mobility   Outcome Measure  Help needed turning from your back to your side while in a flat bed without using bedrails?: A Lot Help needed moving from lying on your back to sitting on the side of a flat bed without using bedrails?: A Lot Help needed moving to and from a bed to a chair (including a wheelchair)?: A Lot Help needed standing up from a chair using your arms (e.g., wheelchair or bedside chair)?: A Lot Help needed to  walk in hospital room?: Total Help needed climbing 3-5 steps with a railing? : Total 6 Click Score: 10    End of Session Equipment Utilized During Treatment: Oxygen Activity Tolerance: Patient tolerated treatment well Patient left: with call bell/phone within reach;in bed;with bed alarm set Nurse Communication: Mobility status PT Visit Diagnosis: Unsteadiness on feet (R26.81);Muscle weakness (generalized) (M62.81);Difficulty in walking, not elsewhere classified (R26.2)     Time: 7356-7014 PT Time Calculation (min) (ACUTE ONLY): 9 min  Charges:  $Therapeutic Exercise: 8-22 mins                     Blondell Reveal Kistler PT 06/01/2020  Acute Rehabilitation Services Pager [phone] Office [phone]

## 2020-06-01 NOTE — Plan of Care (Signed)
°  Problem: Fluid Volume: °Goal: Hemodynamic stability will improve °Outcome: Progressing °  °Problem: Clinical Measurements: °Goal: Diagnostic test results will improve °Outcome: Progressing °Goal: Signs and symptoms of infection will decrease °Outcome: Progressing °  °

## 2020-06-01 NOTE — Progress Notes (Signed)
Occupational Therapy Treatment Patient Details Name: Tamara Wood MRN: [id number] DOB: December 31, 1945 Today's Date: 06/01/2020    History of present illness Patient is a 74 year old  female with PMH of Pancreatic cancer, DM2, HTN, admitted with abdominal pain and increased fatigue. Patient diagnosed with severe sepsis, PNA and spontaneous bacterial peritonitis   OT comments  Treatment focused on improving mobility and activity tolerance in the setting of performing pericare, transferring to edge of bed and transferring to recliner. Patient min assist to roll left and right in bed - using upper extremities on bed rail, mod assist to transfer into sitting for trunk management, min assist to stand from elevated bed height (+2 for safety), mod assist x 2 (physical assistance) from low recliner height. Patient able to stand holding onto walker for second pericare task in front of recliner. Patient had no complaints of dizziness during transfers and standing. Cont POC to continue to progress patient.   Follow Up Recommendations  Home health OT;Supervision/Assistance - 24 hour    Equipment Recommendations  None recommended by OT    Recommendations for Other Services      Precautions / Restrictions Precautions Precautions: Fall Precaution Comments: monitor BP, loose and frequent BMs (family has Depends if needed) Restrictions Weight Bearing Restrictions: No       Mobility Bed Mobility Overal bed mobility: Needs Assistance Bed Mobility: Rolling;Supine to Sit Rolling: Min assist   Supine to sit: Mod assist;HOB elevated     General bed mobility comments: Mod assist for trunk lift off off to transfer to side of bed.  Transfers Overall transfer level: Needs assistance Equipment used: Rolling walker (2 wheeled) Transfers: Sit to/from [facility] Sit to Stand: Min assist;+2 safety/equipment;From elevated surface Stand pivot transfers: Min assist;+2 safety/equipment        General transfer comment: Min assist x 2 (for safety) for sit to stand from elevated height. Mod assist x 2 (physical assistance) from low height.    Balance Overall balance assessment: Needs assistance Sitting-balance support: No upper extremity supported;Feet unsupported Sitting balance-Leahy Scale: Fair     Standing balance support: Bilateral upper extremity supported;During functional activity Standing balance-Leahy Scale: Poor                             ADL either performed or assessed with clinical judgement   ADL                           Toilet Transfer: Moderate assistance;+2 for physical assistance;+2 for safety/equipment Toilet Transfer Details (indicate cue type and reason): Assessed via sit to stand from recliner. Mod x 2 to stand from lower height. Toileting- Water quality scientist and Hygiene: Sit to/from stand;Bed level;Total assistance Toileting - Clothing Manipulation Details (indicate cue type and reason): Initially patient total assist at bed level for toileting - pericare. Patient able to assist with rolling left and right but due to incontinence required total care. Then patient had another episode of incontinence seated in chair. Patient able to stand and hold onto walker during pericare, brief change and placement of purewick (approx 3 minutes).     Functional mobility during ADLs: Minimal assistance;+2 for safety/equipment;Rolling walker       Vision       Perception     Praxis      Cognition Arousal/Alertness: Awake/alert Behavior During Therapy: WFL for tasks assessed/performed Overall Cognitive Status: Within Functional Limits for tasks  assessed                                          Exercises     Shoulder Instructions       General Comments      Pertinent Vitals/ Pain       Pain Assessment: No/denies pain  Home Living                                          Prior  Functioning/Environment              Frequency  Min 2X/week        Progress Toward Goals  OT Goals(current goals can now be found in the care plan section)  Progress towards OT goals: Progressing toward goals  Acute Rehab OT Goals Patient Stated Goal: To go home OT Goal Formulation: With patient/family Time For Goal Achievement: 06/13/20 Potential to Achieve Goals: Good  Plan Discharge plan remains appropriate    Co-evaluation          OT goals addressed during session: ADL's and self-care (functional mobility)      AM-PAC OT "6 Clicks" Daily Activity     Outcome Measure   Help from another person eating meals?: None Help from another person taking care of personal grooming?: A Little Help from another person toileting, which includes using toliet, bedpan, or urinal?: Total Help from another person bathing (including washing, rinsing, drying)?: A Lot Help from another person to put on and taking off regular upper body clothing?: A Little Help from another person to put on and taking off regular lower body clothing?: Total 6 Click Score: 14    End of Session Equipment Utilized During Treatment: Rolling walker  OT Visit Diagnosis: Unsteadiness on feet (R26.81);Other abnormalities of gait and mobility (R26.89);Muscle weakness (generalized) (M62.81)   Activity Tolerance Patient tolerated treatment well   Patient Left in chair;with call bell/phone within reach;with family/visitor present;with chair alarm set   Nurse Communication Mobility status        Time: [id number] OT Time Calculation (min): 31 min  Charges: OT General Charges $OT Visit: 1 Visit OT Treatments $Therapeutic Activity: 23-37 mins  Rian Busche, OTR/L [place]  Office [phone] Pager: [place] 06/01/2020, 2:37 PM

## 2020-06-01 NOTE — Progress Notes (Addendum)
[facility] Progress Note  CC:  SBP  Subjective: She denies having any abdominal pain but when examined she has minor tenderness to the epigastric and lower abdomen. She is tolerating clear liquids. She passes a "loose fatty stool, nonbloody" at [facility] today as reported by her RN. No CP or SOB. Her daughter in-law is at the bedside and she is translating as the patient speaks limited [place].    Objective:  Vital signs in last 24 hours: Temp:  [97.3 F (36.3 C)-98.1 F (36.7 C)] 98 F (36.7 C) (12/16 0700) Pulse Rate:  [80-101] 81 (12/16 0800) Resp:  [15-30] 25 (12/16 0800) BP: (121-149)/(60-93) 139/60 (12/16 0800) SpO2:  [91 %-100 %] 100 % (12/16 0800) Last BM Date: 05/31/20 General:   Alert 74 year old female sitting up in the chair in NAD.  Eyes: No scleral icterus.  Heart: Rhythm slightly irregular, no murmur.  Pulm: Few expiratory wheezes, few crackles LLL.  Abdomen: Soft, nondistended. Mild epigastric and lower abdominal tenderness without rebound or guarding. Hypoactive BS x 4 quads. Extremities:  Without edema. Neurologic:  Alert and  oriented x4;  grossly normal neurologically. Psych:  Alert and cooperative. Normal mood and affect.  Intake/Output from previous day: 12/15 0701 - 12/16 0700 In: 340 [P.O.:240; IV Piggyback:100] Out: -  Intake/Output this shift: Total I/O In: 10 [I.V.:10] Out: -   Lab Results: Recent Labs    05/30/20 0607 05/31/20 0554  WBC 25.9* 16.9*  HGB 8.5* 6.1*  HCT 24.5* 17.7*  PLT 87* 73*   BMET Recent Labs    05/30/20 0607 05/31/20 0554 06/01/20 0500  NA 137 140  --   K 4.5 3.5  --   CL 108 107  --   CO2 20* 23  --   GLUCOSE 86 104*  --   BUN 14 17  --   CREATININE 0.95 1.11* 0.64  CALCIUM 7.0* 7.7*  --    LFT Recent Labs    05/31/20 0554  PROT 4.8*  ALBUMIN 3.0*  AST 54*  ALT 22  ALKPHOS 192*  BILITOT 1.7*   PT/INR No results for input(s): LABPROT, INR in the last 72 hours. Hepatitis  Panel Recent Labs    05/29/20 2030  HEPBSAG NON REACTIVE  HCVAB NON REACTIVE  HEPAIGM NON REACTIVE  HEPBIGM NON REACTIVE    No results found.  Assessment / Plan:  94.  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   She was admitted to [facility] 05/29/2020 with abdominal pain and new onset ascites. Elevated LFTs. Acute hepatitis panel negative. S/P paracentesis 12/13 positive for SBP.  She received Albumin IV 50gm x 2 on 12/14 and 12/15. On Zosyn then switched to Rocephin.  Peritoneal albumin count was not collected therefore SAAG was not calculated.  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 Alk phos 281- >192. T. Bili 1.6 -> 1.7.  -Repeat paracentesis Fri 12/17 to assess improvement of SBP, peritoneal labs to include cell count with differential, Gram stain, albumin, protein, LDH -Continue Rocephin 2gm IV  Q 24 hours -Continue Albumin 50gm IV Q 6 hours for now -Continue Pantoprazole 29m po QD -Oncology following  -CBC and CMP in am  2.  Sepsis secondary to left lower lobe pneumonia and SBP.  Query ascending cholangitis component as CTAP 12/13 identified a CBD stent (which was placed in Jan or Feb 2021 at [facility] in [place] due to obstructive jaundice secondary to her pancreatic mass per her daughter's report).  CTAP 12/13 also showed her pancreatic head mass to be slightly smaller when compared to prior image study. She remains afebrile. WBC25.9 -> 16.9. CBC not done today.  -Continue Rocephin -Oncology following, plans for chemo on hold -Supportive care  3.  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  Patient is a Jehovah witness therefore she declines any blood products.  Iron  50.  Vitamin B12 greater than 7500. Today Hg 6.1. HCT 17.7. PLT 73. No overt signs of GI bleeding.   4.  Elevated creatinine level of 1.11. She is on Lasix 59m po daily.   5. Hepatic steatosis   Further recommendations per Dr. NSilverio Decamp    Active Problems:   Pancreatic cancer ([facility]   Hypertension   DM (diabetes mellitus), type 2 ([facility]   Ascites   Sepsis ([facility]   Acute lower UTI   Elevated LFTs   Thrombocytopenia (H[facility])   SBP (spontaneous bacterial peritonitis) ([facility]   Hypoglycemia without diagnosis of diabetes mellitus   Pressure injury of skin   Abdominal pain     LOS: 3 days   CPatrecia PourKennedy-Smith  06/01/2020, 10:27 AM    Attending physician's note   I have taken an interval history, reviewed the chart and examined the patient. I agree with the Advanced Practitioner's note, impression and recommendations.    Continue with supportive care with IV albumin as needed. She has received 100 gm daily for past 3 days. BUn and Cr stable Continue Broad-spectrum antibiotics. Repeat paracentesis tomorrow  GI will continue to follow along   I have spent 35 minutes of patient care (this includes precharting, chart review, review of results, face-to-face time used for counseling as well as treatment plan and follow-up. The patient was provided an opportunity to ask questions and all were answered. The patient agreed with the plan and demonstrated an understanding of the instructions.  KDamaris Hippo, MD [phone]

## 2020-06-02 ENCOUNTER — Inpatient Hospital Stay (HOSPITAL_COMMUNITY): Payer: 59

## 2020-06-02 DIAGNOSIS — D6481 Anemia due to antineoplastic chemotherapy: Secondary | ICD-10-CM

## 2020-06-02 DIAGNOSIS — T451X5A Adverse effect of antineoplastic and immunosuppressive drugs, initial encounter: Secondary | ICD-10-CM

## 2020-06-02 LAB — LACTATE DEHYDROGENASE, PLEURAL OR PERITONEAL FLUID: LD, Fluid: 973 U/L — ABNORMAL HIGH (ref 3–23)

## 2020-06-02 LAB — COMPREHENSIVE METABOLIC PANEL
ALT: 19 U/L (ref 0–44)
AST: 48 U/L — ABNORMAL HIGH (ref 15–41)
Albumin: 2.7 g/dL — ABNORMAL LOW (ref 3.5–5.0)
Alkaline Phosphatase: 285 U/L — ABNORMAL HIGH (ref 38–126)
Anion gap: 9 (ref 5–15)
BUN: 18 mg/dL (ref 8–23)
CO2: 25 mmol/L (ref 22–32)
Calcium: 7.7 mg/dL — ABNORMAL LOW (ref 8.9–10.3)
Chloride: 107 mmol/L (ref 98–111)
Creatinine, Ser: 0.62 mg/dL (ref 0.44–1.00)
GFR, Estimated: >60 mL/min (ref >60)
Glucose, Bld: 88 mg/dL (ref 70–99)
Potassium: 3.3 mmol/L — ABNORMAL LOW (ref 3.5–5.1)
Sodium: 141 mmol/L (ref 135–145)
Total Bilirubin: 2.2 mg/dL — ABNORMAL HIGH (ref 0.3–1.2)
Total Protein: 4.9 g/dL — ABNORMAL LOW (ref 6.5–8.1)

## 2020-06-02 LAB — CBC WITH DIFFERENTIAL/PLATELET
Abs Immature Granulocytes: 0.18 10*3/uL — ABNORMAL HIGH (ref 0.00–0.07)
Basophils Absolute: 0 10*3/uL (ref 0.0–0.1)
Basophils Relative: 0 %
Eosinophils Absolute: 0 10*3/uL (ref 0.0–0.5)
Eosinophils Relative: 0 %
HCT: 21.9 % — ABNORMAL LOW (ref 36.0–46.0)
Hemoglobin: 7.5 g/dL — ABNORMAL LOW (ref 12.0–15.0)
Immature Granulocytes: 1 %
Lymphocytes Relative: 6 %
Lymphs Abs: 0.8 10*3/uL (ref 0.7–4.0)
MCH: 34.9 pg — ABNORMAL HIGH (ref 26.0–34.0)
MCHC: 34.2 g/dL (ref 30.0–36.0)
MCV: 101.9 fL — ABNORMAL HIGH (ref 80.0–100.0)
Monocytes Absolute: 0.6 10*3/uL (ref 0.1–1.0)
Monocytes Relative: 5 %
Neutro Abs: 11.4 10*3/uL — ABNORMAL HIGH (ref 1.7–7.7)
Neutrophils Relative %: 88 %
Platelets: 85 10*3/uL — ABNORMAL LOW (ref 150–400)
RBC: 2.15 MIL/uL — ABNORMAL LOW (ref 3.87–5.11)
RDW: 20.7 % — ABNORMAL HIGH (ref 11.5–15.5)
WBC: 13 10*3/uL — ABNORMAL HIGH (ref 4.0–10.5)
nRBC: 0.3 % — ABNORMAL HIGH (ref 0.0–0.2)

## 2020-06-02 LAB — PROTEIN, PLEURAL OR PERITONEAL FLUID: Total protein, fluid: <3 g/dL

## 2020-06-02 LAB — BODY FLUID CELL COUNT WITH DIFFERENTIAL
Eos, Fluid: 0 %
Lymphs, Fluid: 9 %
Monocyte-Macrophage-Serous Fluid: 1 % — ABNORMAL LOW (ref 50–90)
Neutrophil Count, Fluid: 90 % — ABNORMAL HIGH (ref 0–25)
Other Cells, Fluid: 0 %
Total Nucleated Cell Count, Fluid: 4141 cu mm — ABNORMAL HIGH (ref 0–1000)

## 2020-06-02 LAB — BODY FLUID CULTURE: Culture: NO GROWTH

## 2020-06-02 LAB — GLUCOSE, CAPILLARY
Glucose-Capillary: 80 mg/dL (ref 70–99)
Glucose-Capillary: 100 mg/dL — ABNORMAL HIGH (ref 70–99)
Glucose-Capillary: 105 mg/dL — ABNORMAL HIGH (ref 70–99)
Glucose-Capillary: 156 mg/dL — ABNORMAL HIGH (ref 70–99)
Glucose-Capillary: 123 mg/dL — ABNORMAL HIGH (ref 70–99)
Glucose-Capillary: 93 mg/dL (ref 70–99)

## 2020-06-02 LAB — ALBUMIN, PLEURAL OR PERITONEAL FLUID: Albumin, Fluid: <1 g/dL

## 2020-06-02 MED ORDER — LIDOCAINE HCL 1 % IJ SOLN
INTRAMUSCULAR | Status: AC
  Filled 2020-06-02: qty 20

## 2020-06-02 MED ORDER — POTASSIUM CHLORIDE CRYS ER 20 MEQ PO TBCR
60.0000 meq | EXTENDED_RELEASE_TABLET | Freq: Once | ORAL | Status: AC
  Administered 2020-06-02: 10:00:00 60 meq via ORAL
  Filled 2020-06-02: qty 3

## 2020-06-02 MED ORDER — MAGNESIUM SULFATE 4 GM/100ML IV SOLN
4.0000 g | Freq: Once | INTRAVENOUS | Status: AC
  Administered 2020-06-02: 09:00:00 4 g via INTRAVENOUS
  Filled 2020-06-02: qty 100

## 2020-06-02 NOTE — Progress Notes (Signed)
PROGRESS NOTE    Tamara Wood  HER:[id number] DOB: 07/28/1945 DOA: 05/29/2020 PCP: Isaac Bliss, Rayford Halsted, MD    Brief Narrative:  74 year old female with history of pancreatic cancer currently on palliative chemotherapy, diabetes type 2, hypertension, Jehovah's Witness who presented with weakness, abdominal pain.  She was also having nausea and vomiting and some loose stools at home.  She was diagnosed with pancreatic cancer about a year ago and underwent Whipple procedure in March 2021 but could not complete secondary to metastatic disease and instead underwent gastrojejunal bypass.  She is currently on palliative chemotherapy,follows with Dr Burr Medico .On presentation she was hypotensive, tachycardic.  Patient was admitted for the management of severe sepsis.  Found to have pneumonia and SBP.  Assessment & Plan:   Active Problems:   Pancreatic cancer ([place])   Hypertension   DM (diabetes mellitus), type 2 (HCC)   Ascites   Sepsis ([place])   Acute lower UTI   Elevated LFTs   Thrombocytopenia (HCC)   SBP (spontaneous bacterial peritonitis) ([place])   Hypoglycemia without diagnosis of diabetes mellitus   Pressure injury of skin   Abdominal pain   Elevated INR   Liver failure without hepatic coma (HCC)   Severe sepsis: Presented with hypotension, tachycardia, lactic acidosis, elevated white cell counts.  Ascitic fluid analysis suggestive of SBP .imaging showed left middle/lower zone infiltrate suspicios for pneumonia. Pt is continued on broad spec abx. WBC down to 13k today  SBP: Found to have ascites.  He underwent diagnostic paracentesis in the emergency department with finding of SBP(total neutrophils of 9450).  Currently continued on rocephin, has been on abx since 12/13  Acute hypoxic respiratory failure: Currently remains on 2 L of oxygen per minute.  Chest imaging showed bilateral pleural effusions with compression atelectasis, possibility of infiltrate on the left lung.  Continue  supplemental oxygen as needed.  Cont to wean O2 as pt tolerates  Advanced pancreatic cancer: Follows with oncology, Dr. Burr Medico.  Currently on palliative chemotherapy.  CT imagings did not show progression of her malignancy, showed hepatic steatosis.  She is status post biliary stent.She  has elevated ALP and mild elevated liver enzymes along with total bilirubin.  Last chemo was on 12/2.  Oncology following. Cytology in fluid pending  Anasarca: Presented with abdominal distention,severe lower extremity edema, bilateral pleural effusion.  Most likely this is from hypoalbuminemia.  Diagnostic paracentesis was done in the emergency department.  Ascitic fluid cytology from 12/14 pending. Cont on lasix as tolerated  Plan for repeat paracentesis planned for 12/17  Thrombocytopenia: Suspect secondary to recent chemo. Improved today, no acute blood loss  Macrocytic anemia: Hemoglobin in the range of 8.  No evidence of acute blood loss. Will check retic count. Pt cannot receive blood given religous beliefs. Oncology is following and pt received aranesp on 12/15  Hypomagnesemia: Being supplemented with Mg. Mg levels normalized  UTI: She was recently started treatment for urinary tract infection and was on Cipro.  Urine cx pos for ecoli species that are pan-sensitive with exception of ampicillin and bactrim. Remains afebrile  Severe protein calorie malnutrition: Has severe hypoalbuminemia.  Nutrition consulted.  IV albumin was given for SBP.  Hypoglycemia: Continue to monitor CBGs.  DVT prophylaxis: SCD's Code Status: No intubation Family Communication: Pt in room, pt's daughter-in-law at bedside  Status is: Inpatient  Remains inpatient appropriate because:Hemodynamically unstable, Ongoing diagnostic testing needed not appropriate for outpatient work up, IV treatments appropriate due to intensity of illness or inability to take  PO and Inpatient level of care appropriate due to severity of  illness   Dispo: The patient is from: Home              Anticipated d/c is to: Home              Anticipated d/c date is: 3 days              Patient currently is not medically stable to d/c.   Consultants:   Oncology  GI  General Surgery  Procedures:   Paracentesis  Antimicrobials: Anti-infectives (From admission, onward)   Start     Dose/Rate Route Frequency Ordered Stop   05/30/20 1600  vancomycin (VANCOCIN) IVPB 1000 mg/200 mL premix  Status:  Discontinued        1,000 mg 200 mL/hr over 60 Minutes Intravenous Every 24 hours 05/29/20 2021 05/30/20 1058   05/30/20 1445  cefTRIAXone (ROCEPHIN) 2 g in sodium chloride 0.9 % 100 mL IVPB        2 g 200 mL/hr over 30 Minutes Intravenous Every 24 hours 05/30/20 1354     05/29/20 2200  piperacillin-tazobactam (ZOSYN) IVPB 3.375 g  Status:  Discontinued        3.375 g 12.5 mL/hr over 240 Minutes Intravenous Every 8 hours 05/29/20 2026 05/30/20 1354   05/29/20 1600  vancomycin (VANCOREADY) IVPB 1250 mg/250 mL        1,250 mg 166.7 mL/hr over 90 Minutes Intravenous  Once 05/29/20 1551 05/29/20 2057   05/29/20 1545  piperacillin-tazobactam (ZOSYN) IVPB 3.375 g        3.375 g 100 mL/hr over 30 Minutes Intravenous  Once 05/29/20 1533 05/29/20 1635   05/29/20 1230  azithromycin (ZITHROMAX) 500 mg in sodium chloride 0.9 % 250 mL IVPB        500 mg 250 mL/hr over 60 Minutes Intravenous  Once 05/29/20 1228 05/29/20 1555   05/29/20 1015  cefTRIAXone (ROCEPHIN) 1 g in sodium chloride 0.9 % 100 mL IVPB  Status:  Discontinued        1 g 200 mL/hr over 30 Minutes Intravenous Every 24 hours 05/29/20 1014 05/29/20 2026      Subjective: Without complaints this AM  Objective: Vitals:   06/02/20 0840 06/02/20 0914 06/02/20 1200 06/02/20 1254  BP:  (!) 164/81 (!) 147/74   Pulse:  97 74   Resp:   (!) 22   Temp: 97.9 F (36.6 C)   97.9 F (36.6 C)  TempSrc: Oral   Oral  SpO2:   100%   Weight:      Height:        Intake/Output  Summary (Last 24 hours) at 06/02/2020 1411 Last data filed at 06/02/2020 1110 Gross per 24 hour  Intake 503.04 ml  Output 850 ml  Net -346.96 ml   Filed Weights   05/29/20 0911 05/30/20 0127  Weight: 56.2 kg 67.9 kg    Examination: General exam: Conversant, in no acute distress Respiratory system: normal chest rise, clear, no audible wheezing Cardiovascular system: regular rhythm, s1-s2 Gastrointestinal system: Nondistended, nontender, pos BS Central nervous system: No seizures, no tremors Extremities: No cyanosis, no joint deformities Skin: No rashes, no pallor Psychiatry: Affect normal // no auditory hallucinations   Data Reviewed: I have personally reviewed following labs and imaging studies  CBC: Recent Labs  Lab 05/29/20 1013 05/30/20 0607 05/31/20 0554 06/02/20 0407  WBC 26.2* 25.9* 16.9* 13.0*  NEUTROABS 22.5* 22.5* 14.8* 11.4*  HGB 10.2* 8.5* 6.1*  7.5*  HCT 29.6* 24.5* 17.7* 21.9*  MCV 99.7 101.2* 101.1* 101.9*  PLT 71* 87* 73* 85*   Basic Metabolic Panel: Recent Labs  Lab 05/29/20 1013 05/30/20 0607 05/31/20 0554 06/01/20 0500 06/01/20 1750 06/02/20 0407  NA 136 137 140  --  141 141  K 3.9 4.5 3.5  --  2.6* 3.3*  CL 106 108 107  --  108 107  CO2 19* 20* 23  --  22 25  GLUCOSE 59* 86 104*  --  149* 88  BUN 14 14 17   --  18 18  CREATININE 0.89 0.95 1.11* 0.64 0.70 0.62  CALCIUM 7.5* 7.0* 7.7*  --  7.6* 7.7*  MG  --  1.1* 2.0  --  1.4*  --   PHOS  --  3.2  --   --   --   --    GFR: Estimated Creatinine Clearance: 57.6 mL/min (by C-G formula based on SCr of 0.62 mg/dL). Liver Function Tests: Recent Labs  Lab 05/29/20 1013 05/30/20 0607 05/31/20 0554 06/01/20 1750 06/02/20 0407  AST 85* 73* 54* 49* 48*  ALT 31 29 22 18 19   ALKPHOS 373* 281* 192* 275* 285*  BILITOT 1.7* 1.6* 1.7* 1.9* 2.2*  PROT 5.1* 4.3* 4.8* 4.7* 4.9*  ALBUMIN 1.7* 1.4* 3.0* 2.7* 2.7*   No results for input(s): LIPASE, AMYLASE in the last 168 hours. Recent Labs  Lab  05/29/20 2030  AMMONIA 36*   Coagulation Profile: Recent Labs  Lab 05/29/20 1013  INR 2.5*   Cardiac Enzymes: No results for input(s): CKTOTAL, CKMB, CKMBINDEX, TROPONINI in the last 168 hours. BNP (last 3 results) No results for input(s): PROBNP in the last 8760 hours. HbA1C: No results for input(s): HGBA1C in the last 72 hours. CBG: Recent Labs  Lab 06/01/20 2001 06/01/20 2321 06/02/20 0341 06/02/20 0807 06/02/20 1222  GLUCAP 141* 94 80 100* 105*   Lipid Profile: No results for input(s): CHOL, HDL, LDLCALC, TRIG, CHOLHDL, LDLDIRECT in the last 72 hours. Thyroid Function Tests: No results for input(s): TSH, T4TOTAL, FREET4, T3FREE, THYROIDAB in the last 72 hours. Anemia Panel: Recent Labs    05/31/20 0938 05/31/20 0939 05/31/20 1647  VITAMINB12 >7,500*  --   --   FOLATE  --  5.7*  --   TIBC NOT CALCULATED  --   --   IRON 50  --   --   RETICCTPCT  --   --  4.8*   Sepsis Labs: Recent Labs  Lab 05/29/20 2030 05/29/20 2232 05/30/20 0300 05/30/20 0607  PROCALCITON 4.60  --   --   --   LATICACIDVEN 5.0* 4.9* 3.8* 2.7*    Recent Results (from the past 240 hour(s))  Blood Culture (routine x 2)     Status: None (Preliminary result)   Collection Time: 05/29/20 11:00 AM   Specimen: BLOOD  Result Value Ref Range Status   Specimen Description   Final    BLOOD LEFT ANTECUBITAL Performed at [facility], [place] [address] [place], [place] [address]    Special Requests   Final    BOTTLES DRAWN AEROBIC AND ANAEROBIC Blood Culture results may not be optimal due to an inadequate volume of blood received in culture bottles Performed at [facility] [address] [place], [place] [address]    Culture   Final    NO GROWTH 4 DAYS Performed at [facility], [place] [address], [place], [place] [address]    Report  Status PENDING  Incomplete  Blood Culture (routine x 2)     Status: None (Preliminary result)   Collection Time:  05/29/20 11:12 AM   Specimen: BLOOD  Result Value Ref Range Status   Specimen Description   Final    BLOOD RIGHT ANTECUBITAL Performed at [place] [address] [place], [place] [address]    Special Requests   Final    BOTTLES DRAWN AEROBIC AND ANAEROBIC Blood Culture adequate volume Performed at [place] [address] [place], [place] [address]    Culture   Final    NO GROWTH 4 DAYS Performed at [facility], [place] [address] [place], [place] [address]    Report Status PENDING  Incomplete  Resp Panel by RT-PCR (Flu A&B, Covid) Nasopharyngeal Swab     Status: None   Collection Time: 05/29/20 11:17 AM   Specimen: Nasopharyngeal Swab; Nasopharyngeal(NP) swabs in vial transport medium  Result Value Ref Range Status   SARS Coronavirus 2 by RT PCR NEGATIVE NEGATIVE Final    Comment: (NOTE) SARS-CoV-2 target nucleic acids are NOT DETECTED.  The SARS-CoV-2 RNA is generally detectable in upper respiratory specimens during the acute phase of infection. The lowest concentration of SARS-CoV-2 viral copies this assay can detect is 138 copies/mL. A negative result does not preclude SARS-Cov-2 infection and should not be used as the sole basis for treatment or other patient management decisions. A negative result may occur with  improper specimen collection/handling, submission of specimen other than nasopharyngeal swab, presence of viral mutation(s) within the areas targeted by this assay, and inadequate number of viral copies(<138 copies/mL). A negative result must be combined with clinical observations, patient history, and epidemiological information. The expected result is Negative.  Fact Sheet for Patients:  EntrepreneurPulse.com.au  Fact Sheet for Healthcare Providers:  IncredibleEmployment.be  This test is no t yet approved or cleared by the [place] FDA and  has been authorized for  detection and/or diagnosis of SARS-CoV-2 by FDA under an Emergency Use Authorization (EUA). This EUA will remain  in effect (meaning this test can be used) for the duration of the COVID-19 declaration under Section 564(b)(1) of the Act, 21 U.S.C.section 360bbb-3(b)(1), unless the authorization is terminated  or revoked sooner.       Influenza A by PCR NEGATIVE NEGATIVE Final   Influenza B by PCR NEGATIVE NEGATIVE Final    Comment: (NOTE) The Xpert Xpress SARS-CoV-2/FLU/RSV plus assay is intended as an aid in the diagnosis of influenza from Nasopharyngeal swab specimens and should not be used as a sole basis for treatment. Nasal washings and aspirates are unacceptable for Xpert Xpress SARS-CoV-2/FLU/RSV testing.  Fact Sheet for Patients: EntrepreneurPulse.com.au  Fact Sheet for Healthcare Providers: IncredibleEmployment.be  This test is not yet approved or cleared by the [place] FDA and has been authorized for detection and/or diagnosis of SARS-CoV-2 by FDA under an Emergency Use Authorization (EUA). This EUA will remain in effect (meaning this test can be used) for the duration of the COVID-19 declaration under Section 564(b)(1) of the Act, 21 U.S.C. section 360bbb-3(b)(1), unless the authorization is terminated or revoked.  Performed at [facility], [place] [address] [place], [place] [address]   Urine culture     Status: None   Collection Time: 05/29/20 12:48 PM   Specimen: In/Out Cath Urine  Result Value Ref Range Status   Specimen Description   Final    IN/OUT CATH URINE Performed at [facility], [place] [address] [place], [place] [address]    Special Requests   Final    NONE Performed at [facility], [place] [address], [place], [place] [address]    Culture   Final    NO GROWTH Performed at [facility], [place] [address] [place], [place] [address]    Report Status  05/30/2020 FINAL  Final  Body fluid culture (includes gram stain)     Status: None   Collection Time: 05/29/20  4:34 PM   Specimen: Peritoneal Washings; Peritoneal Fluid  Result Value Ref Range Status   Specimen Description   Final    PERITONEAL FLUID Performed at [facility], [place] [address] [place], [place] [address]    Special Requests   Final    NONE Performed at [facility], [place] [address], [place], [place] [address]    Gram Stain   Final    FEW WBC PRESENT, PREDOMINANTLY MONONUCLEAR NO ORGANISMS SEEN    Culture   Final    NO GROWTH 3 DAYS Performed at [place] [address], [place], [place] [address]    Report Status 06/02/2020 FINAL  Final  MRSA PCR Screening     Status: None   Collection Time: 05/30/20  1:26 AM   Specimen: Nasal Mucosa; Nasopharyngeal  Result Value Ref Range Status   MRSA by PCR NEGATIVE NEGATIVE Final    Comment:        The GeneXpert MRSA Assay (FDA approved for NASAL specimens only), is one component of a comprehensive MRSA colonization surveillance program. It is not intended to diagnose MRSA infection nor to guide or monitor treatment for MRSA infections. Performed at [facility], [place] [address] [place], [place] [address]      Radiology Studies: No results found.  Scheduled Meds: . (feeding supplement) PROSource Plus  30 mL Oral BID BM  . vitamin C  500 mg Oral Daily  . Chlorhexidine Gluconate Cloth  6 each Topical Q0600  . furosemide  20 mg Oral Daily  . hydrocortisone  1 application Rectal BID  . insulin aspart  0-9 Units Subcutaneous Q4H  . mouth rinse  15 mL Mouth Rinse BID  . metoprolol tartrate  12.5 mg Oral BID  . multivitamin with minerals  1 tablet Oral Daily  . nutrition supplement (JUVEN)  1 packet Oral BID BM  . pantoprazole  20 mg Oral Daily  . sodium chloride flush  10-40 mL Intracatheter Q12H  . zinc sulfate  220 mg Oral Daily   Continuous Infusions: .  albumin human    . cefTRIAXone (ROCEPHIN)  IV Stopped (06/01/20 1625)     LOS: 4 days   [name], MD [facility] Pager On Amion  If 7PM-7AM, please contact night-coverage 06/02/2020, 2:11 PM

## 2020-06-02 NOTE — Procedures (Signed)
Ultrasound-guided diagnostic and therapeutic paracentesis performed yielding 1 liter (maximum ordered) of hazy, yellow fluid. No immediate complications. EBL < 1 cc. A portion of the fluid was sent to the lab for preordered studies.

## 2020-06-02 NOTE — Plan of Care (Signed)
°  Problem: Fluid Volume: °Goal: Hemodynamic stability will improve °Outcome: Progressing °  °Problem: Clinical Measurements: °Goal: Diagnostic test results will improve °Outcome: Progressing °Goal: Signs and symptoms of infection will decrease °Outcome: Progressing °  °

## 2020-06-02 NOTE — Progress Notes (Addendum)
[facility] Progress Note  CC:  SBP  Subjective: She is awake.  No nausea or vomiting.  No abdominal pain.  She is having nonbloody watery brown diarrhea since midday yesterday.  Her son Legrand Como at the bedside, he is interpreting to facilitate communication.   Objective:  Vital signs in last 24 hours: Temp:  [97.9 F (36.6 C)-98.1 F (36.7 C)] 97.9 F (36.6 C) (12/17 0840) Pulse Rate:  [42-110] 74 (12/17 1200) Resp:  [17-24] 22 (12/17 1200) BP: (122-170)/(71-83) 147/74 (12/17 1200) SpO2:  [97 %-100 %] 100 % (12/17 1200) Last BM Date: 06/02/20 General:   She is alert in no acute distress. Heart: Regular rhythm, no murmur. Pulm: Breath sounds clear, diminished in bases. Abdomen: Soft, nondistended.  Positive bowel sounds all 4 quadrants.  Mild right mid abdominal tenderness without rebound or guarding.  Flexi-Seal rectal bag with approximately 200 cc of watery brown diarrhea. Extremities:  Without edema. Neurologic:  Alert and  oriented x 4;  grossly normal neurologically. Psych:  Alert and cooperative. Normal mood and affect.  Intake/Output from previous day: 12/16 0701 - 12/17 0700 In: 414.8 [I.V.:10; IV Piggyback:404.8] Out: 850 [Urine:850] Intake/Output this shift: Total I/O In: 98.3 [IV Piggyback:98.3] Out: -   Lab Results: Recent Labs    05/31/20 0554 06/02/20 0407  WBC 16.9* 13.0*  HGB 6.1* 7.5*  HCT 17.7* 21.9*  PLT 73* 85*   BMET Recent Labs    05/31/20 0554 06/01/20 0500 06/01/20 1750 06/02/20 0407  NA 140  --  141 141  K 3.5  --  2.6* 3.3*  CL 107  --  108 107  CO2 23  --  22 25  GLUCOSE 104*  --  149* 88  BUN 17  --  18 18  CREATININE 1.11* 0.64 0.70 0.62  CALCIUM 7.7*  --  7.6* 7.7*   LFT Recent Labs    06/02/20 0407  PROT 4.9*  ALBUMIN 2.7*  AST 48*  ALT 19  ALKPHOS 285*  BILITOT 2.2*   PT/INR No results for input(s): LABPROT, INR in the last 72 hours. Hepatitis Panel No results for input(s): HEPBSAG, HCVAB,  HEPAIGM, HEPBIGM in the last 72 hours.  No results found.  Assessment / Plan:  73. 74 year old female with unresectable pancreatic adenocarcinoma on palliative chemotherapy, [place] of the SMV, agastrojejunostomywas completed. [facility] andnew onsetascites.Elevated LFTs. Acute hepatitis panel negative.S/P paracentesis 12/13positive for SBP.Cytology pending.  She received Albumin IV 100gm on 12/14 and 12/15. On Zosyn then switched to Rocephin.Peritoneal albumin count was not collected therefore SAAG was not calculated. 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 Repeat  paracentesis to reassess for SBP for today, not yet completed. -Await paracentesis results, peritoneal labs to include cell count with differential, Gram stain aerobic and aerobic cultures, LDH, albumin, protein.  -Continue Pantoprazole 20mg  po QD -Oncology following  2. Sepsis secondary to left lower lobe pneumonia and SBP. Query ascending cholangitis component as CTAP 12/13 identified a CBDstent (placedin Jan [facility] Feb [facility] [place] due toobstructive jaundicesecondary to herpancreatic mass). CTAP12/13 alsoshowed herpancreatic head mass to be slightly smaller when compared to prior image study.She remains afebrile. Leukocytosis improving. Today WBC 13. -ContinueRocephin  -Oncology following, plans for chemo on hold -Supportive care  3. 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 Patient  is a Jehovah witness therefore she declines any blood products. Iron 50. Vitamin B12 greater than 7500.Today Hg 7.5. PLT 85. No overt signs of GI bleeding.  4.  Diarrhea, most likely due to antibiotics and clear liquid diet -Check C. difficile PCR  if not already done  Further recommendations per Dr. Silverio Decamp   Active Problems:   Pancreatic cancer ([place])   Hypertension   DM (diabetes mellitus), type 2 ([place])   Ascites   Sepsis ([place])   Acute lower UTI   Elevated LFTs   Thrombocytopenia (HCC)   SBP (spontaneous bacterial peritonitis) ([place])   Hypoglycemia without diagnosis of diabetes mellitus   Pressure injury of skin   Abdominal pain   Elevated INR   Liver failure without hepatic coma ([place])     LOS: 4 days   Noralyn Pick  06/02/2020, 12:31 PM   Attending physician's note   I have taken a history, examined the patient and reviewed the chart. I agree with the Advanced Practitioner's note, impression and recommendations.  Patient had repeat diagnostic paracentesis this afternoon, fluid studies are not back yet Can discharge home if SBP is improving Continue antibiotics to complete 14-day course and will need to continue antibiotics for secondary prophylaxis to prevent recurrent SBP  Follow-up with oncology and GI on discharge as outpatient  GI will not round over the weekend, available if needed.  Dr. Collene Mares will be covering [facility] this weekend   I have spent 25 minutes of patient care (this includes precharting, chart review, review of results, face-to-face time used for counseling as well as treatment plan and follow-up. The patient was provided an opportunity to ask questions and all were answered. The patient agreed with the plan and demonstrated an understanding of the instructions.  [name] , MD [phone]

## 2020-06-03 LAB — COMPREHENSIVE METABOLIC PANEL
ALT: 17 U/L (ref 0–44)
AST: 43 U/L — ABNORMAL HIGH (ref 15–41)
Albumin: 2.5 g/dL — ABNORMAL LOW (ref 3.5–5.0)
Alkaline Phosphatase: 235 U/L — ABNORMAL HIGH (ref 38–126)
Anion gap: 8 (ref 5–15)
BUN: 21 mg/dL (ref 8–23)
CO2: 25 mmol/L (ref 22–32)
Calcium: 7.4 mg/dL — ABNORMAL LOW (ref 8.9–10.3)
Chloride: 106 mmol/L (ref 98–111)
Creatinine, Ser: 0.57 mg/dL (ref 0.44–1.00)
GFR, Estimated: >60 mL/min (ref >60)
Glucose, Bld: 116 mg/dL — ABNORMAL HIGH (ref 70–99)
Potassium: 3.6 mmol/L (ref 3.5–5.1)
Sodium: 139 mmol/L (ref 135–145)
Total Bilirubin: 2.1 mg/dL — ABNORMAL HIGH (ref 0.3–1.2)
Total Protein: 4.5 g/dL — ABNORMAL LOW (ref 6.5–8.1)

## 2020-06-03 LAB — GLUCOSE, CAPILLARY
Glucose-Capillary: 79 mg/dL (ref 70–99)
Glucose-Capillary: 96 mg/dL (ref 70–99)
Glucose-Capillary: 109 mg/dL — ABNORMAL HIGH (ref 70–99)
Glucose-Capillary: 147 mg/dL — ABNORMAL HIGH (ref 70–99)
Glucose-Capillary: 134 mg/dL — ABNORMAL HIGH (ref 70–99)

## 2020-06-03 LAB — CULTURE, BLOOD (ROUTINE X 2)
Culture: NO GROWTH
Culture: NO GROWTH
Special Requests: ADEQUATE

## 2020-06-03 LAB — MAGNESIUM: Magnesium: 1.9 mg/dL (ref 1.7–2.4)

## 2020-06-03 MED ORDER — POTASSIUM CHLORIDE CRYS ER 20 MEQ PO TBCR
60.0000 meq | EXTENDED_RELEASE_TABLET | Freq: Once | ORAL | Status: AC
  Administered 2020-06-03: 10:00:00 60 meq via ORAL
  Filled 2020-06-03: qty 3

## 2020-06-03 NOTE — Progress Notes (Signed)
PROGRESS NOTE    Tamara Wood  DTO:[id number] DOB: January 10, 1946 DOA: 05/29/2020 PCP: Isaac Bliss, Rayford Halsted, MD    Brief Narrative:  74 year old female with history of pancreatic cancer currently on palliative chemotherapy, diabetes type 2, hypertension, Jehovah's Witness who presented with weakness, abdominal pain.  She was also having nausea and vomiting and some loose stools at home.  She was diagnosed with pancreatic cancer about a year ago and underwent Whipple procedure in March 2021 but could not complete secondary to metastatic disease and instead underwent gastrojejunal bypass.  She is currently on palliative chemotherapy,follows with Dr Burr Medico .On presentation she was hypotensive, tachycardic.  Patient was admitted for the management of severe sepsis.  Found to have pneumonia and SBP.  Assessment & Plan:   Active Problems:   Pancreatic cancer ([place])   Hypertension   DM (diabetes mellitus), type 2 (HCC)   Ascites   Sepsis ([place])   Acute lower UTI   Elevated LFTs   Thrombocytopenia (HCC)   SBP (spontaneous bacterial peritonitis) ([place])   Hypoglycemia without diagnosis of diabetes mellitus   Pressure injury of skin   Abdominal pain   Elevated INR   Liver failure without hepatic coma (HCC)   Severe sepsis: Presented with hypotension, tachycardia, lactic acidosis, elevated white cell counts.  Ascitic fluid analysis suggestive of SBP .imaging showed left middle/lower zone infiltrate suspicios for pneumonia. Pt is continued on broad spec abx. WBC down to 13k as of 12/17  SBP: Found to have ascites.  He underwent diagnostic paracentesis in the emergency department with finding of SBP(total neutrophils of 9450).  Currently continued on rocephin, has been on abx since 12/13. Pt underwent repeat paracentesis yielding 1L fluid on 12/17. Fluid with 4100 WBC's and ANC of 3,727  Acute hypoxic respiratory failure: Currently remains on 2 L of oxygen per minute.  Chest imaging showed  bilateral pleural effusions with compression atelectasis, possibility of infiltrate on the left lung.  Continue supplemental oxygen as needed.  Cont to wean O2 as tolerated  Advanced pancreatic cancer: Follows with oncology, Dr. Burr Medico.  Currently on palliative chemotherapy.  CT imagings did not show progression of her malignancy, showed hepatic steatosis.  She is status post biliary stent.She  has elevated ALP and mild elevated liver enzymes along with total bilirubin.  Last chemo was on 12/2.  Oncology following. Cytology in fluid remains pending  Anasarca: Presented with abdominal distention,severe lower extremity edema, bilateral pleural effusion.  Most likely this is from hypoalbuminemia.  Diagnostic paracentesis was done in the emergency department.  Ascitic fluid cytology from 12/14 pending. Cont on lasix as tolerated  Underwent repeat paracentesis on 12/17  Thrombocytopenia: Suspect secondary to recent chemo. Improvement noted recently  Macrocytic anemia: Hemoglobin in the range of 8.  No evidence of acute blood loss. Will check retic count. Pt cannot receive blood given religous beliefs. Oncology is following and pt received aranesp on 12/15  Hypomagnesemia: Being supplemented with Mg. Mg levels normalized  UTI: She was recently started treatment for urinary tract infection and was on Cipro.  Urine cx pos for ecoli species that are pan-sensitive with exception of ampicillin and bactrim. Remains afebrile at this time  Severe protein calorie malnutrition: Has severe hypoalbuminemia.  Nutrition consulted.  IV albumin was given for SBP.  Hypoglycemia: Continue to monitor CBGs.  DVT prophylaxis: SCD's Code Status: No intubation Family Communication: Pt in room, family currently not at bedside  Status is: Inpatient  Remains inpatient appropriate because:Hemodynamically unstable, Ongoing diagnostic testing needed  not appropriate for outpatient work up, IV treatments appropriate due  to intensity of illness or inability to take PO and Inpatient level of care appropriate due to severity of illness   Dispo: The patient is from: Home              Anticipated d/c is to: Home              Anticipated d/c date is: 3 days              Patient currently is not medically stable to d/c.   Consultants:   Oncology  GI  General Surgery  Procedures:   Paracentesis  Antimicrobials: Anti-infectives (From admission, onward)   Start     Dose/Rate Route Frequency Ordered Stop   05/30/20 1600  vancomycin (VANCOCIN) IVPB 1000 mg/200 mL premix  Status:  Discontinued        1,000 mg 200 mL/hr over 60 Minutes Intravenous Every 24 hours 05/29/20 2021 05/30/20 1058   05/30/20 1445  cefTRIAXone (ROCEPHIN) 2 g in sodium chloride 0.9 % 100 mL IVPB        2 g 200 mL/hr over 30 Minutes Intravenous Every 24 hours 05/30/20 1354     05/29/20 2200  piperacillin-tazobactam (ZOSYN) IVPB 3.375 g  Status:  Discontinued        3.375 g 12.5 mL/hr over 240 Minutes Intravenous Every 8 hours 05/29/20 2026 05/30/20 1354   05/29/20 1600  vancomycin (VANCOREADY) IVPB 1250 mg/250 mL        1,250 mg 166.7 mL/hr over 90 Minutes Intravenous  Once 05/29/20 1551 05/29/20 2057   05/29/20 1545  piperacillin-tazobactam (ZOSYN) IVPB 3.375 g        3.375 g 100 mL/hr over 30 Minutes Intravenous  Once 05/29/20 1533 05/29/20 1635   05/29/20 1230  azithromycin (ZITHROMAX) 500 mg in sodium chloride 0.9 % 250 mL IVPB        500 mg 250 mL/hr over 60 Minutes Intravenous  Once 05/29/20 1228 05/29/20 1555   05/29/20 1015  cefTRIAXone (ROCEPHIN) 1 g in sodium chloride 0.9 % 100 mL IVPB  Status:  Discontinued        1 g 200 mL/hr over 30 Minutes Intravenous Every 24 hours 05/29/20 1014 05/29/20 2026      Subjective: No complaints. Denies abd pain  Objective: Vitals:   06/02/20 2053 06/03/20 0320 06/03/20 0557 06/03/20 1358  BP: 120/71 (!) 121/58 128/70 137/85  Pulse: 74 76 74 82  Resp: 16 16 16 16   Temp: 98  F (36.7 C) 97.9 F (36.6 C) 98.3 F (36.8 C) 98 F (36.7 C)  TempSrc: Oral Oral Oral Oral  SpO2: 100% 99% 98% 98%  Weight:      Height:        Intake/Output Summary (Last 24 hours) at 06/03/2020 1646 Last data filed at 06/03/2020 1300 Gross per 24 hour  Intake 676.1 ml  Output 675 ml  Net 1.1 ml   Filed Weights   05/29/20 0911 05/30/20 0127 06/02/20 1900  Weight: 56.2 kg 67.9 kg 73.3 kg    Examination: General exam: Awake, laying in bed, in nad Respiratory system: Normal respiratory effort, no wheezing Cardiovascular system: regular rate, s1, s2 Gastrointestinal system: Soft, nondistended, positive BS Central nervous system: CN2-12 grossly intact, strength intact Extremities: Perfused, no clubbing Skin: Normal skin turgor, no notable skin lesions seen Psychiatry: Mood normal // no visual hallucinations    Data Reviewed: I have personally reviewed following labs and imaging studies  CBC: Recent Labs  Lab 05/29/20 1013 05/30/20 0607 05/31/20 0554 06/02/20 0407  WBC 26.2* 25.9* 16.9* 13.0*  NEUTROABS 22.5* 22.5* 14.8* 11.4*  HGB 10.2* 8.5* 6.1* 7.5*  HCT 29.6* 24.5* 17.7* 21.9*  MCV 99.7 101.2* 101.1* 101.9*  PLT 71* 87* 73* 85*   Basic Metabolic Panel: Recent Labs  Lab 05/30/20 0607 05/31/20 0554 06/01/20 0500 06/01/20 1750 06/02/20 0407 06/03/20 0500  NA 137 140  --  141 141 139  K 4.5 3.5  --  2.6* 3.3* 3.6  CL 108 107  --  108 107 106  CO2 20* 23  --  22 25 25   GLUCOSE 86 104*  --  149* 88 116*  BUN 14 17  --  18 18 21   CREATININE 0.95 1.11* 0.64 0.70 0.62 0.57  CALCIUM 7.0* 7.7*  --  7.6* 7.7* 7.4*  MG 1.1* 2.0  --  1.4*  --  1.9  PHOS 3.2  --   --   --   --   --    GFR: Estimated Creatinine Clearance: 59.6 mL/min (by C-G formula based on SCr of 0.57 mg/dL). Liver Function Tests: Recent Labs  Lab 05/30/20 0607 05/31/20 0554 06/01/20 1750 06/02/20 0407 06/03/20 0500  AST 73* 54* 49* 48* 43*  ALT 29 22 18 19 17   ALKPHOS 281* 192* 275*  285* 235*  BILITOT 1.6* 1.7* 1.9* 2.2* 2.1*  PROT 4.3* 4.8* 4.7* 4.9* 4.5*  ALBUMIN 1.4* 3.0* 2.7* 2.7* 2.5*   No results for input(s): LIPASE, AMYLASE in the last 168 hours. Recent Labs  Lab 05/29/20 2030  AMMONIA 36*   Coagulation Profile: Recent Labs  Lab 05/29/20 1013  INR 2.5*   Cardiac Enzymes: No results for input(s): CKTOTAL, CKMB, CKMBINDEX, TROPONINI in the last 168 hours. BNP (last 3 results) No results for input(s): PROBNP in the last 8760 hours. HbA1C: No results for input(s): HGBA1C in the last 72 hours. CBG: Recent Labs  Lab 06/02/20 1936 06/02/20 2321 06/03/20 0321 06/03/20 0738 06/03/20 1152  GLUCAP 123* 93 79 96 109*   Lipid Profile: No results for input(s): CHOL, HDL, LDLCALC, TRIG, CHOLHDL, LDLDIRECT in the last 72 hours. Thyroid Function Tests: No results for input(s): TSH, T4TOTAL, FREET4, T3FREE, THYROIDAB in the last 72 hours. Anemia Panel: Recent Labs    05/31/20 1647  RETICCTPCT 4.8*   Sepsis Labs: Recent Labs  Lab 05/29/20 2030 05/29/20 2232 05/30/20 0300 05/30/20 0607  PROCALCITON 4.60  --   --   --   LATICACIDVEN 5.0* 4.9* 3.8* 2.7*    Recent Results (from the past 240 hour(s))  Blood Culture (routine x 2)     Status: None   Collection Time: 05/29/20 11:00 AM   Specimen: BLOOD  Result Value Ref Range Status   Specimen Description   Final    BLOOD LEFT ANTECUBITAL Performed at [facility], [place] [address] [place], [place] [address]    Special Requests   Final    BOTTLES DRAWN AEROBIC AND ANAEROBIC Blood Culture results may not be optimal due to an inadequate volume of blood received in culture bottles Performed at [place] [address], [place], [place] [address]    Culture   Final    NO GROWTH 5 DAYS Performed at [facility], [place] [address], [place], [place] [address]    Report Status 06/03/2020 FINAL  Final  Blood Culture (routine x 2)     Status: None    Collection  Time: 05/29/20 11:12 AM   Specimen: BLOOD  Result Value Ref Range Status   Specimen Description   Final    BLOOD RIGHT ANTECUBITAL Performed at [place] [address] [place], [place] [address]    Special Requests   Final    BOTTLES DRAWN AEROBIC AND ANAEROBIC Blood Culture adequate volume Performed at [place] [address], [place], [place] [address]    Culture   Final    NO GROWTH 5 DAYS Performed at [facility], [place] [address] [place], [place] [address]    Report Status 06/03/2020 FINAL  Final  Resp Panel by RT-PCR (Flu A&B, Covid) Nasopharyngeal Swab     Status: None   Collection Time: 05/29/20 11:17 AM   Specimen: Nasopharyngeal Swab; Nasopharyngeal(NP) swabs in vial transport medium  Result Value Ref Range Status   SARS Coronavirus 2 by RT PCR NEGATIVE NEGATIVE Final    Comment: (NOTE) SARS-CoV-2 target nucleic acids are NOT DETECTED.  The SARS-CoV-2 RNA is generally detectable in upper respiratory specimens during the acute phase of infection. The lowest concentration of SARS-CoV-2 viral copies this assay can detect is 138 copies/mL. A negative result does not preclude SARS-Cov-2 infection and should not be used as the sole basis for treatment or other patient management decisions. A negative result may occur with  improper specimen collection/handling, submission of specimen other than nasopharyngeal swab, presence of viral mutation(s) within the areas targeted by this assay, and inadequate number of viral copies(<138 copies/mL). A negative result must be combined with clinical observations, patient history, and epidemiological information. The expected result is Negative.  Fact Sheet for Patients:  EntrepreneurPulse.com.au  Fact Sheet for Healthcare Providers:  IncredibleEmployment.be  This test is no t yet approved or cleared by the [place] FDA and  has  been authorized for detection and/or diagnosis of SARS-CoV-2 by FDA under an Emergency Use Authorization (EUA). This EUA will remain  in effect (meaning this test can be used) for the duration of the COVID-19 declaration under Section 564(b)(1) of the Act, 21 U.S.C.section 360bbb-3(b)(1), unless the authorization is terminated  or revoked sooner.       Influenza A by PCR NEGATIVE NEGATIVE Final   Influenza B by PCR NEGATIVE NEGATIVE Final    Comment: (NOTE) The Xpert Xpress SARS-CoV-2/FLU/RSV plus assay is intended as an aid in the diagnosis of influenza from Nasopharyngeal swab specimens and should not be used as a sole basis for treatment. Nasal washings and aspirates are unacceptable for Xpert Xpress SARS-CoV-2/FLU/RSV testing.  Fact Sheet for Patients: EntrepreneurPulse.com.au  Fact Sheet for Healthcare Providers: IncredibleEmployment.be  This test is not yet approved or cleared by the [place] FDA and has been authorized for detection and/or diagnosis of SARS-CoV-2 by FDA under an Emergency Use Authorization (EUA). This EUA will remain in effect (meaning this test can be used) for the duration of the COVID-19 declaration under Section 564(b)(1) of the Act, 21 U.S.C. section 360bbb-3(b)(1), unless the authorization is terminated or revoked.  Performed at [facility], [place] [address] [place], [place] [address]   Urine culture     Status: None   Collection Time: 05/29/20 12:48 PM   Specimen: In/Out Cath Urine  Result Value Ref Range Status   Specimen Description   Final    IN/OUT CATH URINE Performed at [facility] [address], [place],  [address]    Special Requests   Final    NONE  Performed at [facility], [place] [address] [place], [place] [address]    Culture   Final    NO GROWTH Performed at [facility], [place] [address] [place], [place]  [address]    Report Status 05/30/2020 FINAL  Final  Body fluid culture (includes gram stain)     Status: None   Collection Time: 05/29/20  4:34 PM   Specimen: Peritoneal Washings; Peritoneal Fluid  Result Value Ref Range Status   Specimen Description   Final    PERITONEAL FLUID Performed at [facility], [place] [address] [place], [place] [address]    Special Requests   Final    NONE Performed at [facility], [place] [address], [place], [place] [address]    Gram Stain   Final    FEW WBC PRESENT, PREDOMINANTLY MONONUCLEAR NO ORGANISMS SEEN    Culture   Final    NO GROWTH 3 DAYS Performed at [place] [address], [place], [place] [address]    Report Status 06/02/2020 FINAL  Final  MRSA PCR Screening     Status: None   Collection Time: 05/30/20  1:26 AM   Specimen: Nasal Mucosa; Nasopharyngeal  Result Value Ref Range Status   MRSA by PCR NEGATIVE NEGATIVE Final    Comment:        The GeneXpert MRSA Assay (FDA approved for NASAL specimens only), is one component of a comprehensive MRSA colonization surveillance program. It is not intended to diagnose MRSA infection nor to guide or monitor treatment for MRSA infections. Performed at [facility], [place] [address] [place], [place] [address]   Culture, body fluid-bottle     Status: None (Preliminary result)   Collection Time: 06/02/20  6:42 PM   Specimen: Peritoneal Washings  Result Value Ref Range Status   Specimen Description PERITONEAL  Final   Special Requests NONE  Final   Culture   Final    NO GROWTH < 24 HOURS Performed at [facility], [place] [address], [place], [place] [address]    Report Status PENDING  Incomplete  Gram stain     Status: None (Preliminary result)   Collection Time: 06/02/20  6:42 PM   Specimen: Peritoneal Washings  Result Value Ref Range Status   Specimen Description PERITONEAL  Final   Special Requests NONE  Final   Gram Stain   Final     FEW WBC PRESENT, PREDOMINANTLY MONONUCLEAR NO ORGANISMS SEEN Performed at [facility], [place] [address], [place], [place] [address]    Report Status PENDING  Incomplete     Radiology Studies: US Paracentesis  Result Date: 06/02/2020 INDICATION: Patient with history of pancreatic cancer, spontaneous bacterial peritonitis, sepsis, recurrent ascites. Request made for diagnostic and therapeutic paracentesis up to 1 liter. EXAM: ULTRASOUND GUIDED DIAGNOSTIC AND THERAPEUTIC PARACENTESIS MEDICATIONS: 1% lidocaine to skin and subcutaneous tissue COMPLICATIONS: None immediate. PROCEDURE: Informed written consent was obtained from the patient via interpreter after a discussion of the risks, benefits and alternatives to treatment. A timeout was performed prior to the initiation of the procedure. Initial ultrasound scanning demonstrates a small to moderate amount of ascites within the left upper to mid abdominal quadrant. The left upper to mid abdomen was prepped and draped in the usual sterile fashion. 1% lidocaine was used for local anesthesia. Following this, a 19 gauge, 10-cm, Yueh catheter was introduced. An ultrasound image was saved for documentation purposes. The paracentesis was  performed. The catheter was removed and a dressing was applied. The patient tolerated the procedure well without immediate post procedural complication. FINDINGS: A total of approximately 1 liter of hazy, yellow fluid was removed. Samples were sent to the laboratory as requested by the clinical team. IMPRESSION: Successful ultrasound-guided diagnostic and therapeutic paracentesis yielding 1 liter of peritoneal fluid. Read by: Rowe Robert, PA-C Electronically Signed   By: Jacqulynn Cadet M.D.   On: 06/02/2020 16:36    Scheduled Meds: . (feeding supplement) PROSource Plus  30 mL Oral BID BM  . vitamin C  500 mg Oral Daily  . Chlorhexidine Gluconate Cloth  6 each Topical Q0600  . furosemide  20 mg Oral Daily  .  hydrocortisone  1 application Rectal BID  . insulin aspart  0-9 Units Subcutaneous Q4H  . mouth rinse  15 mL Mouth Rinse BID  . metoprolol tartrate  12.5 mg Oral BID  . multivitamin with minerals  1 tablet Oral Daily  . nutrition supplement (JUVEN)  1 packet Oral BID BM  . pantoprazole  20 mg Oral Daily  . sodium chloride flush  10-40 mL Intracatheter Q12H  . zinc sulfate  220 mg Oral Daily   Continuous Infusions: . cefTRIAXone (ROCEPHIN)  IV 2 g (06/03/20 1509)     LOS: 5 days   [name], MD [facility] Pager On Amion  If 7PM-7AM, please contact night-coverage 06/03/2020, 4:46 PM

## 2020-06-04 ENCOUNTER — Other Ambulatory Visit: Payer: Self-pay | Admitting: Internal Medicine

## 2020-06-04 ENCOUNTER — Other Ambulatory Visit: Payer: Self-pay | Admitting: Hematology

## 2020-06-04 LAB — GLUCOSE, CAPILLARY
Glucose-Capillary: 108 mg/dL — ABNORMAL HIGH (ref 70–99)
Glucose-Capillary: 128 mg/dL — ABNORMAL HIGH (ref 70–99)
Glucose-Capillary: 130 mg/dL — ABNORMAL HIGH (ref 70–99)
Glucose-Capillary: 147 mg/dL — ABNORMAL HIGH (ref 70–99)
Glucose-Capillary: 169 mg/dL — ABNORMAL HIGH (ref 70–99)
Glucose-Capillary: 74 mg/dL (ref 70–99)
Glucose-Capillary: 90 mg/dL (ref 70–99)

## 2020-06-04 LAB — COMPREHENSIVE METABOLIC PANEL
ALT: 20 U/L (ref 0–44)
AST: 59 U/L — ABNORMAL HIGH (ref 15–41)
Albumin: 2.4 g/dL — ABNORMAL LOW (ref 3.5–5.0)
Alkaline Phosphatase: 299 U/L — ABNORMAL HIGH (ref 38–126)
Anion gap: 8 (ref 5–15)
BUN: 18 mg/dL (ref 8–23)
CO2: 26 mmol/L (ref 22–32)
Calcium: 7.7 mg/dL — ABNORMAL LOW (ref 8.9–10.3)
Chloride: 108 mmol/L (ref 98–111)
Creatinine, Ser: 0.46 mg/dL (ref 0.44–1.00)
GFR, Estimated: >60 mL/min (ref >60)
Glucose, Bld: 128 mg/dL — ABNORMAL HIGH (ref 70–99)
Potassium: 3.9 mmol/L (ref 3.5–5.1)
Sodium: 142 mmol/L (ref 135–145)
Total Bilirubin: 2.6 mg/dL — ABNORMAL HIGH (ref 0.3–1.2)
Total Protein: 4.9 g/dL — ABNORMAL LOW (ref 6.5–8.1)

## 2020-06-04 LAB — CBC
HCT: 22.2 % — ABNORMAL LOW (ref 36.0–46.0)
Hemoglobin: 7.6 g/dL — ABNORMAL LOW (ref 12.0–15.0)
MCH: 35 pg — ABNORMAL HIGH (ref 26.0–34.0)
MCHC: 34.2 g/dL (ref 30.0–36.0)
MCV: 102.3 fL — ABNORMAL HIGH (ref 80.0–100.0)
Platelets: 80 10*3/uL — ABNORMAL LOW (ref 150–400)
RBC: 2.17 MIL/uL — ABNORMAL LOW (ref 3.87–5.11)
RDW: 22.3 % — ABNORMAL HIGH (ref 11.5–15.5)
WBC: 11.4 10*3/uL — ABNORMAL HIGH (ref 4.0–10.5)
nRBC: 0.4 % — ABNORMAL HIGH (ref 0.0–0.2)

## 2020-06-04 LAB — GRAM STAIN

## 2020-06-04 NOTE — Progress Notes (Signed)
PROGRESS NOTE    Tamara Wood  NAT:[id number] DOB: 1945-11-17 DOA: 05/29/2020 PCP: Isaac Bliss, Rayford Halsted, MD    Brief Narrative:  74 year old female with history of pancreatic cancer currently on palliative chemotherapy, diabetes type 2, hypertension, Jehovah's Witness who presented with weakness, abdominal pain.  She was also having nausea and vomiting and some loose stools at home.  She was diagnosed with pancreatic cancer about a year ago and underwent Whipple procedure in March 2021 but could not complete secondary to metastatic disease and instead underwent gastrojejunal bypass.  She is currently on palliative chemotherapy,follows with Dr Burr Medico .On presentation she was hypotensive, tachycardic.  Patient was admitted for the management of severe sepsis.  Found to have pneumonia and SBP.  Assessment & Plan:   Active Problems:   Pancreatic cancer ([place])   Hypertension   DM (diabetes mellitus), type 2 (HCC)   Ascites   Sepsis ([place])   Acute lower UTI   Elevated LFTs   Thrombocytopenia (HCC)   SBP (spontaneous bacterial peritonitis) ([place])   Hypoglycemia without diagnosis of diabetes mellitus   Pressure injury of skin   Abdominal pain   Elevated INR   Liver failure without hepatic coma (HCC)   Severe sepsis: Presented with hypotension, tachycardia, lactic acidosis, elevated white cell counts.  Ascitic fluid analysis suggestive of SBP .imaging showed left middle/lower zone infiltrate suspicios for pneumonia. Pt is continued on broad spec abx. WBC down to 11.4k today  SBP: Found to have ascites.  He underwent diagnostic paracentesis in the emergency department with finding of SBP(total neutrophils of 9450).  Currently continued on rocephin, has been on abx since 12/13. Pt underwent repeat paracentesis yielding 1L fluid on 12/17. Fluid with 4100 WBC's and ANC of 3,727  Acute hypoxic respiratory failure: Currently remains on 2 L of oxygen per minute.  Chest imaging showed bilateral  pleural effusions with compression atelectasis, possibility of infiltrate on the left lung.  Continue supplemental oxygen as needed. Wean O2 as tolerated  Advanced pancreatic cancer: Follows with oncology, Dr. Burr Medico.  Currently on palliative chemotherapy.  CT imagings did not show progression of her malignancy, showed hepatic steatosis.  She is status post biliary stent.She  has elevated ALP and mild elevated liver enzymes along with total bilirubin.  Last chemo was on 12/2.  Oncology following. Fluid cytology remains pending  Anasarca: Presented with abdominal distention,severe lower extremity edema, bilateral pleural effusion.  Most likely this is from hypoalbuminemia.  Diagnostic paracentesis was done in the emergency department.  Ascitic fluid cytology from 12/14 pending. Cont on lasix as tolerated  Underwent repeat paracentesis on 12/17  Thrombocytopenia: Suspect secondary to recent chemo. Improvement noted recently  Macrocytic anemia: Hemoglobin in the range of 8.  No evidence of acute blood loss. Will check retic count. Pt cannot receive blood given religous beliefs. Oncology is following and pt received aranesp on 12/15. Hgb improved. F/u on CBC  Hypomagnesemia: Being supplemented with Mg. Mg levels normalized  UTI: She was recently started treatment for urinary tract infection and was on Cipro.  Urine cx pos for ecoli species that are pan-sensitive with exception of ampicillin and bactrim. Remains afebrile at this time  Severe protein calorie malnutrition: Has severe hypoalbuminemia.  Nutrition consulted.  IV albumin was given for SBP.  Hypoglycemia: Continue to monitor CBGs.  DVT prophylaxis: SCD's Code Status: No intubation Family Communication: Pt in room, family currently not at bedside  Status is: Inpatient  Remains inpatient appropriate because:Hemodynamically unstable, Ongoing diagnostic testing needed not  appropriate for outpatient work up, IV treatments appropriate  due to intensity of illness or inability to take PO and Inpatient level of care appropriate due to severity of illness   Dispo: The patient is from: Home              Anticipated d/c is to: Home              Anticipated d/c date is: 2 days              Patient currently is not medically stable to d/c.   Consultants:   Oncology  GI  General Surgery  Procedures:   Paracentesis  Antimicrobials: Anti-infectives (From admission, onward)   Start     Dose/Rate Route Frequency Ordered Stop   05/30/20 1600  vancomycin (VANCOCIN) IVPB 1000 mg/200 mL premix  Status:  Discontinued        1,000 mg 200 mL/hr over 60 Minutes Intravenous Every 24 hours 05/29/20 2021 05/30/20 1058   05/30/20 1445  cefTRIAXone (ROCEPHIN) 2 g in sodium chloride 0.9 % 100 mL IVPB        2 g 200 mL/hr over 30 Minutes Intravenous Every 24 hours 05/30/20 1354     05/29/20 2200  piperacillin-tazobactam (ZOSYN) IVPB 3.375 g  Status:  Discontinued        3.375 g 12.5 mL/hr over 240 Minutes Intravenous Every 8 hours 05/29/20 2026 05/30/20 1354   05/29/20 1600  vancomycin (VANCOREADY) IVPB 1250 mg/250 mL        1,250 mg 166.7 mL/hr over 90 Minutes Intravenous  Once 05/29/20 1551 05/29/20 2057   05/29/20 1545  piperacillin-tazobactam (ZOSYN) IVPB 3.375 g        3.375 g 100 mL/hr over 30 Minutes Intravenous  Once 05/29/20 1533 05/29/20 1635   05/29/20 1230  azithromycin (ZITHROMAX) 500 mg in sodium chloride 0.9 % 250 mL IVPB        500 mg 250 mL/hr over 60 Minutes Intravenous  Once 05/29/20 1228 05/29/20 1555   05/29/20 1015  cefTRIAXone (ROCEPHIN) 1 g in sodium chloride 0.9 % 100 mL IVPB  Status:  Discontinued        1 g 200 mL/hr over 30 Minutes Intravenous Every 24 hours 05/29/20 1014 05/29/20 2026      Subjective: Without complaints. Denies abd pain  Objective: Vitals:   06/03/20 1358 06/03/20 2005 06/04/20 0431 06/04/20 1356  BP: 137/85 135/71 138/76 (!) 153/82  Pulse: 82 82 79 76  Resp: 16 18 18 15    Temp: 98 F (36.7 C) 97.7 F (36.5 C) 97.7 F (36.5 C) 98 F (36.7 C)  TempSrc: Oral Axillary Oral Oral  SpO2: 98% 98% 97% 99%  Weight:      Height:        Intake/Output Summary (Last 24 hours) at 06/04/2020 1723 Last data filed at 06/04/2020 1500 Gross per 24 hour  Intake 960 ml  Output 3500 ml  Net -2540 ml   Filed Weights   05/29/20 0911 05/30/20 0127 06/02/20 1900  Weight: 56.2 kg 67.9 kg 73.3 kg    Examination: General exam: Conversant, in no acute distress Respiratory system: normal chest rise, clear, no audible wheezing Cardiovascular system: regular rhythm, s1-s2 Gastrointestinal system: Nondistended, nontender, pos BS Central nervous system: No seizures, no tremors Extremities: No cyanosis, no joint deformities Skin: No rashes, no pallor Psychiatry: Affect normal // no auditory hallucinations   Data Reviewed: I have personally reviewed following labs and imaging studies  CBC: Recent Labs  Lab 05/29/20 1013 05/30/20 0607 05/31/20 0554 06/02/20 0407 06/04/20 0606  WBC 26.2* 25.9* 16.9* 13.0* 11.4*  NEUTROABS 22.5* 22.5* 14.8* 11.4*  --   HGB 10.2* 8.5* 6.1* 7.5* 7.6*  HCT 29.6* 24.5* 17.7* 21.9* 22.2*  MCV 99.7 101.2* 101.1* 101.9* 102.3*  PLT 71* 87* 73* 85* 80*   Basic Metabolic Panel: Recent Labs  Lab 05/30/20 0607 05/31/20 0554 06/01/20 0500 06/01/20 1750 06/02/20 0407 06/03/20 0500 06/04/20 0606  NA 137 140  --  141 141 139 142  K 4.5 3.5  --  2.6* 3.3* 3.6 3.9  CL 108 107  --  108 107 106 108  CO2 20* 23  --  22 25 25 26   GLUCOSE 86 104*  --  149* 88 116* 128*  BUN 14 17  --  18 18 21 18   CREATININE 0.95 1.11* 0.64 0.70 0.62 0.57 0.46  CALCIUM 7.0* 7.7*  --  7.6* 7.7* 7.4* 7.7*  MG 1.1* 2.0  --  1.4*  --  1.9  --   PHOS 3.2  --   --   --   --   --   --    GFR: Estimated Creatinine Clearance: 59.6 mL/min (by C-G formula based on SCr of 0.46 mg/dL). Liver Function Tests: Recent Labs  Lab 05/31/20 0554 06/01/20 1750  06/02/20 0407 06/03/20 0500 06/04/20 0606  AST 54* 49* 48* 43* 59*  ALT 22 18 19 17 20   ALKPHOS 192* 275* 285* 235* 299*  BILITOT 1.7* 1.9* 2.2* 2.1* 2.6*  PROT 4.8* 4.7* 4.9* 4.5* 4.9*  ALBUMIN 3.0* 2.7* 2.7* 2.5* 2.4*   No results for input(s): LIPASE, AMYLASE in the last 168 hours. Recent Labs  Lab 05/29/20 2030  AMMONIA 36*   Coagulation Profile: Recent Labs  Lab 05/29/20 1013  INR 2.5*   Cardiac Enzymes: No results for input(s): CKTOTAL, CKMB, CKMBINDEX, TROPONINI in the last 168 hours. BNP (last 3 results) No results for input(s): PROBNP in the last 8760 hours. HbA1C: No results for input(s): HGBA1C in the last 72 hours. CBG: Recent Labs  Lab 06/04/20 0433 06/04/20 0517 06/04/20 0728 06/04/20 1202 06/04/20 1628  GLUCAP 74 90 130* 108* 169*   Lipid Profile: No results for input(s): CHOL, HDL, LDLCALC, TRIG, CHOLHDL, LDLDIRECT in the last 72 hours. Thyroid Function Tests: No results for input(s): TSH, T4TOTAL, FREET4, T3FREE, THYROIDAB in the last 72 hours. Anemia Panel: No results for input(s): VITAMINB12, FOLATE, FERRITIN, TIBC, IRON, RETICCTPCT in the last 72 hours. Sepsis Labs: Recent Labs  Lab 05/29/20 2030 05/29/20 2232 05/30/20 0300 05/30/20 0607  PROCALCITON 4.60  --   --   --   LATICACIDVEN 5.0* 4.9* 3.8* 2.7*    Recent Results (from the past 240 hour(s))  Blood Culture (routine x 2)     Status: None   Collection Time: 05/29/20 11:00 AM   Specimen: BLOOD  Result Value Ref Range Status   Specimen Description   Final    BLOOD LEFT ANTECUBITAL Performed at [facility], [place] [address] [place], [place] [address]    Special Requests   Final    BOTTLES DRAWN AEROBIC AND ANAEROBIC Blood Culture results may not be optimal due to an inadequate volume of blood received in culture bottles Performed at [place] [address], [place], [place] [address]    Culture   Final    NO GROWTH 5 DAYS Performed  at [facility], [place] [address] [place], [place]  [address]    Report Status 06/03/2020 FINAL  Final  Blood Culture (routine x 2)     Status: None   Collection Time: 05/29/20 11:12 AM   Specimen: BLOOD  Result Value Ref Range Status   Specimen Description   Final    BLOOD RIGHT ANTECUBITAL Performed at [facility] [address], [place], [place] [address]    Special Requests   Final    BOTTLES DRAWN AEROBIC AND ANAEROBIC Blood Culture adequate volume Performed at [facility] [address], [place], [place] [address]    Culture   Final    NO GROWTH 5 DAYS Performed at [facility], [place] [address], [place], [place] [address]    Report Status 06/03/2020 FINAL  Final  Resp Panel by RT-PCR (Flu A&B, Covid) Nasopharyngeal Swab     Status: None   Collection Time: 05/29/20 11:17 AM   Specimen: Nasopharyngeal Swab; Nasopharyngeal(NP) swabs in vial transport medium  Result Value Ref Range Status   SARS Coronavirus 2 by RT PCR NEGATIVE NEGATIVE Final    Comment: (NOTE) SARS-CoV-2 target nucleic acids are NOT DETECTED.  The SARS-CoV-2 RNA is generally detectable in upper respiratory specimens during the acute phase of infection. The lowest concentration of SARS-CoV-2 viral copies this assay can detect is 138 copies/mL. A negative result does not preclude SARS-Cov-2 infection and should not be used as the sole basis for treatment or other patient management decisions. A negative result may occur with  improper specimen collection/handling, submission of specimen other than nasopharyngeal swab, presence of viral mutation(s) within the areas targeted by this assay, and inadequate number of viral copies(<138 copies/mL). A negative result must be combined with clinical observations, patient history, and epidemiological information. The expected result is Negative.  Fact Sheet for Patients:   EntrepreneurPulse.com.au  Fact Sheet for Healthcare Providers:  IncredibleEmployment.be  This test is no t yet approved or cleared by the [place] FDA and  has been authorized for detection and/or diagnosis of SARS-CoV-2 by FDA under an Emergency Use Authorization (EUA). This EUA will remain  in effect (meaning this test can be used) for the duration of the COVID-19 declaration under Section 564(b)(1) of the Act, 21 U.S.C.section 360bbb-3(b)(1), unless the authorization is terminated  or revoked sooner.       Influenza A by PCR NEGATIVE NEGATIVE Final   Influenza B by PCR NEGATIVE NEGATIVE Final    Comment: (NOTE) The Xpert Xpress SARS-CoV-2/FLU/RSV plus assay is intended as an aid in the diagnosis of influenza from Nasopharyngeal swab specimens and should not be used as a sole basis for treatment. Nasal washings and aspirates are unacceptable for Xpert Xpress SARS-CoV-2/FLU/RSV testing.  Fact Sheet for Patients: EntrepreneurPulse.com.au  Fact Sheet for Healthcare Providers: IncredibleEmployment.be  This test is not yet approved or cleared by the [place] FDA and has been authorized for detection and/or diagnosis of SARS-CoV-2 by FDA under an Emergency Use Authorization (EUA). This EUA will remain in effect (meaning this test can be used) for the duration of the COVID-19 declaration under Section 564(b)(1) of the Act, 21 U.S.C. section 360bbb-3(b)(1), unless the authorization is terminated or revoked.  Performed at [facility], [place] [address], [place], [place] [address]   Urine culture     Status: None   Collection Time: 05/29/20 12:48 PM   Specimen: In/Out Cath Urine  Result Value Ref Range Status   Specimen Description   Final    IN/OUT CATH URINE  Performed at [facility], [place] [address] [place], [place] [address]    Special Requests   Final     NONE Performed at [facility], [place] [address] [place], [place] [address]    Culture   Final    NO GROWTH Performed at [facility], [place] [address], [place], [place] [address]    Report Status 05/30/2020 FINAL  Final  Body fluid culture (includes gram stain)     Status: None   Collection Time: 05/29/20  4:34 PM   Specimen: Peritoneal Washings; Peritoneal Fluid  Result Value Ref Range Status   Specimen Description   Final    PERITONEAL FLUID Performed at [facility], [place] [address], [place], [place] [address]    Special Requests   Final    NONE Performed at [facility], [place] [address], [place], [place] [address]    Gram Stain   Final    FEW WBC PRESENT, PREDOMINANTLY MONONUCLEAR NO ORGANISMS SEEN    Culture   Final    NO GROWTH 3 DAYS Performed at [place] [address], [place], [place] [address]    Report Status 06/02/2020 FINAL  Final  MRSA PCR Screening     Status: None   Collection Time: 05/30/20  1:26 AM   Specimen: Nasal Mucosa; Nasopharyngeal  Result Value Ref Range Status   MRSA by PCR NEGATIVE NEGATIVE Final    Comment:        The GeneXpert MRSA Assay (FDA approved for NASAL specimens only), is one component of a comprehensive MRSA colonization surveillance program. It is not intended to diagnose MRSA infection nor to guide or monitor treatment for MRSA infections. Performed at [facility], [place] [address] [place], [place] [address]   Culture, body fluid-bottle     Status: None (Preliminary result)   Collection Time: 06/02/20  6:42 PM   Specimen: Peritoneal Washings  Result Value Ref Range Status   Specimen Description PERITONEAL  Final   Special Requests NONE  Final   Culture   Final    NO GROWTH 1 DAY Performed at [facility], [place] [address] [place], [place] [address]    Report Status PENDING  Incomplete  Gram stain     Status: None (Preliminary result)    Collection Time: 06/02/20  6:42 PM   Specimen: Peritoneal Washings  Result Value Ref Range Status   Specimen Description PERITONEAL  Final   Special Requests NONE  Final   Gram Stain   Final    FEW WBC PRESENT, PREDOMINANTLY MONONUCLEAR NO ORGANISMS SEEN Performed at [facility], [place] [address] [place],  [address]    Report Status PENDING  Incomplete     Radiology Studies: No results found.  Scheduled Meds: . (feeding supplement) PROSource Plus  30 mL Oral BID BM  . vitamin C  500 mg Oral Daily  . Chlorhexidine Gluconate Cloth  6 each Topical Q0600  . furosemide  20 mg Oral Daily  . hydrocortisone  1 application Rectal BID  . insulin aspart  0-9 Units Subcutaneous Q4H  . mouth rinse  15 mL Mouth Rinse BID  . metoprolol tartrate  12.5 mg Oral BID  . multivitamin with minerals  1 tablet Oral Daily  . nutrition supplement (JUVEN)  1 packet Oral BID BM  . pantoprazole  20 mg Oral Daily  . sodium chloride flush  10-40 mL Intracatheter Q12H  .  zinc sulfate  220 mg Oral Daily   Continuous Infusions: . cefTRIAXone (ROCEPHIN)  IV 2 g (06/04/20 1530)     LOS: 6 days   [name], MD [facility] Pager On Amion  If 7PM-7AM, please contact night-coverage 06/04/2020, 5:23 PM

## 2020-06-04 NOTE — Progress Notes (Signed)
Labs drawn from right chest PAC. Pt tolerated well.

## 2020-06-04 NOTE — Progress Notes (Signed)
Gave morning meds and did assessment with help of Language Video interpreter, # [phone].

## 2020-06-05 LAB — GLUCOSE, CAPILLARY
Glucose-Capillary: 112 mg/dL — ABNORMAL HIGH (ref 70–99)
Glucose-Capillary: 120 mg/dL — ABNORMAL HIGH (ref 70–99)
Glucose-Capillary: 146 mg/dL — ABNORMAL HIGH (ref 70–99)
Glucose-Capillary: 157 mg/dL — ABNORMAL HIGH (ref 70–99)
Glucose-Capillary: 172 mg/dL — ABNORMAL HIGH (ref 70–99)
Glucose-Capillary: 208 mg/dL — ABNORMAL HIGH (ref 70–99)
Glucose-Capillary: 84 mg/dL (ref 70–99)

## 2020-06-05 LAB — COMPREHENSIVE METABOLIC PANEL
ALT: 21 U/L (ref 0–44)
AST: 62 U/L — ABNORMAL HIGH (ref 15–41)
Albumin: 2.1 g/dL — ABNORMAL LOW (ref 3.5–5.0)
Alkaline Phosphatase: 272 U/L — ABNORMAL HIGH (ref 38–126)
Anion gap: 6 (ref 5–15)
BUN: 18 mg/dL (ref 8–23)
CO2: 27 mmol/L (ref 22–32)
Calcium: 7.5 mg/dL — ABNORMAL LOW (ref 8.9–10.3)
Chloride: 109 mmol/L (ref 98–111)
Creatinine, Ser: 0.33 mg/dL — ABNORMAL LOW (ref 0.44–1.00)
GFR, Estimated: >60 mL/min (ref >60)
Glucose, Bld: 117 mg/dL — ABNORMAL HIGH (ref 70–99)
Potassium: 4 mmol/L (ref 3.5–5.1)
Sodium: 142 mmol/L (ref 135–145)
Total Bilirubin: 1.8 mg/dL — ABNORMAL HIGH (ref 0.3–1.2)
Total Protein: 4.5 g/dL — ABNORMAL LOW (ref 6.5–8.1)

## 2020-06-05 LAB — C DIFFICILE (CDIFF) QUICK SCRN (NO PCR REFLEX)
C Diff antigen: NEGATIVE
C Diff interpretation: NOT DETECTED
C Diff toxin: NEGATIVE

## 2020-06-05 LAB — CBC
HCT: 21 % — ABNORMAL LOW (ref 36.0–46.0)
Hemoglobin: 7.2 g/dL — ABNORMAL LOW (ref 12.0–15.0)
MCH: 35.3 pg — ABNORMAL HIGH (ref 26.0–34.0)
MCHC: 34.3 g/dL (ref 30.0–36.0)
MCV: 102.9 fL — ABNORMAL HIGH (ref 80.0–100.0)
Platelets: 76 10*3/uL — ABNORMAL LOW (ref 150–400)
RBC: 2.04 MIL/uL — ABNORMAL LOW (ref 3.87–5.11)
RDW: 22.6 % — ABNORMAL HIGH (ref 11.5–15.5)
WBC: 10.3 10*3/uL (ref 4.0–10.5)
nRBC: 0 % (ref 0.0–0.2)

## 2020-06-05 LAB — CYTOLOGY - NON PAP

## 2020-06-05 MED ORDER — BOOST / RESOURCE BREEZE PO LIQD CUSTOM
1.0000 | Freq: Two times a day (BID) | ORAL | Status: DC
  Administered 2020-06-05 – 2020-06-07 (×4): 1 via ORAL

## 2020-06-05 MED ORDER — ALBUMIN HUMAN 25 % IV SOLN
25.0000 g | Freq: Once | INTRAVENOUS | Status: DC
  Filled 2020-06-05: qty 100

## 2020-06-05 NOTE — Progress Notes (Signed)
Physical Therapy Treatment Patient Details Name: Tamara Wood MRN: [id number] DOB: 07-10-1945 Today's Date: 06/05/2020    History of Present Illness Patient is a 74 year old  female with PMH of Pancreatic cancer, DM2, HTN, admitted with abdominal pain and increased fatigue. Patient diagnosed with severe sepsis, PNA and spontaneous bacterial peritonitis    PT Comments    Pt in bed on 2 lts nasal at 98% with daughter in law in room.  Pt is Ax[place] x 3 but NON English speaking.  [name] interpreting.  Assisted OOB to amb.  General bed mobility comments: required much assist and increased time.  Utilized bed pad to complete scooting to EOB.  Mild c/o feeling "woozy".  General transfer comment: very weak required + 2 side by side by side assist from elevated surface. General Gait Details: VERY WEAK   Limited distance requiring + 2 side by side assist with third following with recliner. Pt unable to attempt a second walk.  Returned to room in recliner and positioned to comfort.  RA was 95% but reapplied 2 lts nasal anyway.   [name] states family is unable to care for pt at this level therfore pt will need [facility] at [facility] prior tyo return home.   Follow Up Recommendations  SNF     Equipment Recommendations  None recommended by PT    Recommendations for Other Services       Precautions / Restrictions Precautions Precautions: Fall Precaution Comments: flexi seal    Mobility  Bed Mobility Overal bed mobility: Needs Assistance Bed Mobility: Rolling;Supine to Sit Rolling: Min assist   Supine to sit: Mod assist;HOB elevated;Max assist     General bed mobility comments: required much assist and increased time.  Utilized bed pad to complete scooting to EOB.  Mild c/o feeling "woozy"  Transfers Overall transfer level: Needs assistance Equipment used: Rolling walker (2 wheeled) Transfers: Sit to/from Stand Sit to Stand: Max assist;+2 physical assistance;+2  safety/equipment;From elevated surface         General transfer comment: very weak required + 2 side by side by side assist from elevated surface.  Ambulation/Gait Ambulation/Gait assistance: +2 physical assistance;+2 safety/equipment;Max assist Gait Distance (Feet): 12 Feet Assistive device: Rolling walker (2 wheeled) Gait Pattern/deviations: Step-to pattern;Decreased step length - right;Trunk flexed Gait velocity: decreased   General Gait Details: VERY WEAK   Limited distance requiring + 2 side by side assist with third following with recliner.   Stairs             Wheelchair Mobility    Modified Rankin (Stroke Patients Only)       Balance                                            Cognition Arousal/Alertness: Awake/alert Behavior During Therapy: WFL for tasks assessed/performed Overall Cognitive Status: Within Functional Limits for tasks assessed                                 General Comments: Non English speaking/daughter in law in room to interpret      Exercises      General Comments        Pertinent Vitals/Pain Pain Assessment: No/denies pain    Home Living  Prior Function            PT Goals (current goals can now be found in the care plan section) Progress towards PT goals: Progressing toward goals    Frequency    Min 3X/week      PT Plan Current plan remains appropriate    Co-evaluation              AM-PAC PT "6 Clicks" Mobility   Outcome Measure  Help needed turning from your back to your side while in a flat bed without using bedrails?: A Lot Help needed moving from lying on your back to sitting on the side of a flat bed without using bedrails?: A Lot Help needed moving to and from a bed to a chair (including a wheelchair)?: A Lot Help needed standing up from a chair using your arms (e.g., wheelchair or bedside chair)?: A Lot Help needed to walk in  hospital room?: A Lot Help needed climbing 3-5 steps with a railing? : Total 6 Click Score: 11    End of Session Equipment Utilized During Treatment: Oxygen Activity Tolerance: Patient limited by fatigue Patient left: in chair;with call bell/phone within reach;with nursing/sitter in room Nurse Communication: Mobility status PT Visit Diagnosis: Unsteadiness on feet (R26.81);Muscle weakness (generalized) (M62.81);Difficulty in walking, not elsewhere classified (R26.2)     Time: 0600-4599 PT Time Calculation (min) (ACUTE ONLY): 27 min  Charges:  $Gait Training: 8-22 mins $Therapeutic Activity: 8-22 mins                     Rica Koyanagi  PTA Acute  Rehabilitation Services Pager      [phone] Office      [phone]

## 2020-06-05 NOTE — Care Management Important Message (Signed)
Important Message  Patient Details IM Letter given to the Patient. Name: Jenah Vanasten MRN: [id number] Date of Birth: 04-Aug-1945   Medicare Important Message Given:  Yes     Kerin Salen 06/05/2020, 10:43 AM

## 2020-06-05 NOTE — Progress Notes (Signed)
Nutrition Follow-up  INTERVENTION:   -Prosource Plus PO BID, each provides 100 kcals and 15g protein  -Juven Fruit Punch BID, each serving provides 95kcal and 2.5g of protein (amino acids glutamine and arginine)  -Boost Breeze po BID, each supplement provides 250 kcal and 9 grams of protein  -Continue daily MVI, Vitamin C and Zinc  NUTRITION DIAGNOSIS:   Increased nutrient needs related to wound healing,cancer and cancer related treatments,acute illness as evidenced by estimated needs.  Ongoing.  GOAL:   Patient will meet greater than or equal to 90% of their needs  Not meeting.  MONITOR:   PO intake,Supplement acceptance,Diet advancement,Labs,Weight trends  ASSESSMENT:   Pt is a 74 y.o. female with PMH of pancreatic cancer, undergoing palliative chemotherapy (last chemo 12/2), T2DM, and HTN. Presented with fatigue and abdominal pain. Abdominal pain has been ongoing for a week PTA associated with N/V/D. Admitted for severe sepsis, PNA, and spontaneous bacterial peritonitis.  Patient continues on clear liquids. Is accepting Prosource Plus and Juven supplements (totaling 390 kcals and 35g protein).  Per GI note, pt with worsening diarrhea. C.diff is being checked. Pt with poor prognosis.  Will order Boost Breeze for additional kcals and protein.  Admission weight: 124 lbs. Current weight: 161 lbs. Per nursing documentation, pt with mild LE edema.  Medications: Vitamin C, Lasix, Multivitamin with minerals daily, zinc sulfate  Labs reviewed: CBGs: 112-172  Diet Order:   Diet Order            Diet clear liquid Room service appropriate? Yes; Fluid consistency: Thin  Diet effective now                 EDUCATION NEEDS:   No education needs have been identified at this time  Skin:  Skin Assessment: Skin Integrity Issues: Skin Integrity Issues:: Stage III Stage III: sacrum  Last BM:  12/20 -rectal tube  Height:   Ht Readings from Last 1 Encounters:  06/02/20  5\' 4"  (1.626 m)    Weight:   Wt Readings from Last 1 Encounters:  06/02/20 73.3 kg    BMI:  Body mass index is 27.76 kg/m.  Estimated Nutritional Needs:   Kcal:  1950-2150  Protein:  100-115 grams  Fluid:  >1.5 L/day  Clayton Bibles, MS, RD, LDN Inpatient Clinical Dietitian Contact information available via Amion

## 2020-06-05 NOTE — Progress Notes (Signed)
PROGRESS NOTE    Tamara Wood  UQJ:[id number] DOB: Sep 28, 1945 DOA: 05/29/2020 PCP: Isaac Bliss, Rayford Halsted, MD    Brief Narrative:  74 year old female with history of pancreatic cancer currently on palliative chemotherapy, diabetes type 2, hypertension, Jehovah's Witness who presented with weakness, abdominal pain.  She was also having nausea and vomiting and some loose stools at home.  She was diagnosed with pancreatic cancer about a year ago and underwent Whipple procedure in March 2021 but could not complete secondary to metastatic disease and instead underwent gastrojejunal bypass.  She is currently on palliative chemotherapy,follows with Dr Burr Medico .On presentation she was hypotensive, tachycardic.  Patient was admitted for the management of severe sepsis.  Found to have pneumonia and SBP.  Assessment & Plan:   Active Problems:   Pancreatic cancer ([place])   Hypertension   DM (diabetes mellitus), type 2 (HCC)   Ascites   Sepsis ([place])   Acute lower UTI   Elevated LFTs   Thrombocytopenia (HCC)   SBP (spontaneous bacterial peritonitis) ([place])   Hypoglycemia without diagnosis of diabetes mellitus   Pressure injury of skin   Abdominal pain   Elevated INR   Liver failure without hepatic coma (HCC)   Severe sepsis with SBP present on admit: Presented with hypotension, tachycardia, lactic acidosis, elevated white cell counts.  Ascitic fluid analysis suggestive of SBP .imaging showed left middle/lower zone infiltrate suspicios for pneumonia. Pt is continued on broad spec abx. WBC down to 11.4k today  SBP: Found to have ascites.  He underwent diagnostic paracentesis in the emergency department with finding of SBP(total neutrophils of 9450).  Currently continued on rocephin, has been on abx since 12/13. Pt underwent repeat paracentesis yielding 1L fluid on 12/17. Fluid with 4100 WBC's and ANC of 3,727. Cytology pending  Acute hypoxic respiratory failure: Currently remains on 2 L of oxygen  per minute.  Chest imaging showed bilateral pleural effusions with compression atelectasis, possibility of infiltrate on the left lung.  Continue supplemental oxygen as needed. Cont to wean O2 as tolerated  Advanced pancreatic cancer: Follows with oncology, Dr. Burr Medico.  Currently on palliative chemotherapy.  CT imagings did not show progression of her malignancy, showed hepatic steatosis.  She is status post biliary stent.She  has elevated ALP and mild elevated liver enzymes along with total bilirubin.  Last chemo was on 12/2.  Oncology is following, ascitic cytology pending  Anasarca: Presented with abdominal distention,severe lower extremity edema, bilateral pleural effusion.  Most likely this is from hypoalbuminemia.  Diagnostic paracentesis was done in the emergency department.  Ascitic fluid cytology from 12/14 pending. Cont on lasix as tolerated  Underwent repeat paracentesis on 12/17  Thrombocytopenia: Suspect secondary to recent chemo. plts of 76 today  Macrocytic anemia: Hemoglobin in the range of 8.  No evidence of acute blood loss. Will check retic count. Pt cannot receive blood given religous beliefs. Oncology is following and pt received aranesp on 12/15. Hgb stable at over 7. F/u on CBC   Hypomagnesemia: Being supplemented with Mg. Mg levels normalized  UTI: She was recently started treatment for urinary tract infection and was on Cipro.  Urine cx pos for ecoli species that are pan-sensitive with exception of ampicillin and bactrim. Remains afebrile currently  Severe protein calorie malnutrition: Has severe hypoalbuminemia.  Nutrition consulted.  Hypoglycemia: Continue to monitor CBGs.  DVT prophylaxis: SCD's Code Status: No intubation Family Communication: Pt in room, family currently not at bedside  Status is: Inpatient  Remains inpatient appropriate because:Hemodynamically unstable,  Ongoing diagnostic testing needed not appropriate for outpatient work up, IV treatments  appropriate due to intensity of illness or inability to take PO and Inpatient level of care appropriate due to severity of illness   Dispo: The patient is from: Home              Anticipated d/c is to: Home              Anticipated d/c date is: 2 days              Patient currently is not medically stable to d/c.   Consultants:   Oncology  GI  General Surgery  Procedures:   Paracentesis  Antimicrobials: Anti-infectives (From admission, onward)   Start     Dose/Rate Route Frequency Ordered Stop   05/30/20 1600  vancomycin (VANCOCIN) IVPB 1000 mg/200 mL premix  Status:  Discontinued        1,000 mg 200 mL/hr over 60 Minutes Intravenous Every 24 hours 05/29/20 2021 05/30/20 1058   05/30/20 1445  cefTRIAXone (ROCEPHIN) 2 g in sodium chloride 0.9 % 100 mL IVPB        2 g 200 mL/hr over 30 Minutes Intravenous Every 24 hours 05/30/20 1354     05/29/20 2200  piperacillin-tazobactam (ZOSYN) IVPB 3.375 g  Status:  Discontinued        3.375 g 12.5 mL/hr over 240 Minutes Intravenous Every 8 hours 05/29/20 2026 05/30/20 1354   05/29/20 1600  vancomycin (VANCOREADY) IVPB 1250 mg/250 mL        1,250 mg 166.7 mL/hr over 90 Minutes Intravenous  Once 05/29/20 1551 05/29/20 2057   05/29/20 1545  piperacillin-tazobactam (ZOSYN) IVPB 3.375 g        3.375 g 100 mL/hr over 30 Minutes Intravenous  Once 05/29/20 1533 05/29/20 1635   05/29/20 1230  azithromycin (ZITHROMAX) 500 mg in sodium chloride 0.9 % 250 mL IVPB        500 mg 250 mL/hr over 60 Minutes Intravenous  Once 05/29/20 1228 05/29/20 1555   05/29/20 1015  cefTRIAXone (ROCEPHIN) 1 g in sodium chloride 0.9 % 100 mL IVPB  Status:  Discontinued        1 g 200 mL/hr over 30 Minutes Intravenous Every 24 hours 05/29/20 1014 05/29/20 2026      Subjective: No complaints this AM, denies abd pain  Objective: Vitals:   06/04/20 2129 06/05/20 0408 06/05/20 1102 06/05/20 1317  BP: (!) 150/79 (!) 151/75 (!) 144/81 (!) 159/87  Pulse: 81 74  100 78  Resp: 16 16  17   Temp: 98.3 F (36.8 C) 97.7 F (36.5 C)  98.1 F (36.7 C)  TempSrc: Oral Oral  Oral  SpO2: 97% 100% 97% 99%  Weight:      Height:        Intake/Output Summary (Last 24 hours) at 06/05/2020 1623 Last data filed at 06/05/2020 1007 Gross per 24 hour  Intake 658.95 ml  Output 975 ml  Net -316.05 ml   Filed Weights   05/29/20 0911 05/30/20 0127 06/02/20 1900  Weight: 56.2 kg 67.9 kg 73.3 kg    Examination: General exam: Awake, laying in bed, in nad Respiratory system: Normal respiratory effort, no wheezing Cardiovascular system: regular rate, s1, s2 Gastrointestinal system: Soft, nondistended, positive BS, rectal tube in place Central nervous system: CN2-12 grossly intact, strength intact Extremities: Perfused, no clubbing Skin: Normal skin turgor, no notable skin lesions seen Psychiatry: Mood normal // no visual hallucinations   Data Reviewed:  I have personally reviewed following labs and imaging studies  CBC: Recent Labs  Lab 05/30/20 0607 05/31/20 0554 06/02/20 0407 06/04/20 0606 06/05/20 0407  WBC 25.9* 16.9* 13.0* 11.4* 10.3  NEUTROABS 22.5* 14.8* 11.4*  --   --   HGB 8.5* 6.1* 7.5* 7.6* 7.2*  HCT 24.5* 17.7* 21.9* 22.2* 21.0*  MCV 101.2* 101.1* 101.9* 102.3* 102.9*  PLT 87* 73* 85* 80* 76*   Basic Metabolic Panel: Recent Labs  Lab 05/30/20 0607 05/31/20 0554 06/01/20 0500 06/01/20 1750 06/02/20 0407 06/03/20 0500 06/04/20 0606 06/05/20 0407  NA 137 140  --  141 141 139 142 142  K 4.5 3.5  --  2.6* 3.3* 3.6 3.9 4.0  CL 108 107  --  108 107 106 108 109  CO2 20* 23  --  22 25 25 26 27   GLUCOSE 86 104*  --  149* 88 116* 128* 117*  BUN 14 17  --  18 18 21 18 18   CREATININE 0.95 1.11*   < > 0.70 0.62 0.57 0.46 0.33*  CALCIUM 7.0* 7.7*  --  7.6* 7.7* 7.4* 7.7* 7.5*  MG 1.1* 2.0  --  1.4*  --  1.9  --   --   PHOS 3.2  --   --   --   --   --   --   --    < > = values in this interval not displayed.   GFR: Estimated Creatinine  Clearance: 59.6 mL/min (A) (by C-G formula based on SCr of 0.33 mg/dL (L)). Liver Function Tests: Recent Labs  Lab 06/01/20 1750 06/02/20 0407 06/03/20 0500 06/04/20 0606 06/05/20 0407  AST 49* 48* 43* 59* 62*  ALT 18 19 17 20 21   ALKPHOS 275* 285* 235* 299* 272*  BILITOT 1.9* 2.2* 2.1* 2.6* 1.8*  PROT 4.7* 4.9* 4.5* 4.9* 4.5*  ALBUMIN 2.7* 2.7* 2.5* 2.4* 2.1*   No results for input(s): LIPASE, AMYLASE in the last 168 hours. Recent Labs  Lab 05/29/20 2030  AMMONIA 36*   Coagulation Profile: No results for input(s): INR, PROTIME in the last 168 hours. Cardiac Enzymes: No results for input(s): CKTOTAL, CKMB, CKMBINDEX, TROPONINI in the last 168 hours. BNP (last 3 results) No results for input(s): PROBNP in the last 8760 hours. HbA1C: No results for input(s): HGBA1C in the last 72 hours. CBG: Recent Labs  Lab 06/04/20 2131 06/05/20 0023 06/05/20 0419 06/05/20 0739 06/05/20 1206  GLUCAP 147* 120* 84 112* 172*   Lipid Profile: No results for input(s): CHOL, HDL, LDLCALC, TRIG, CHOLHDL, LDLDIRECT in the last 72 hours. Thyroid Function Tests: No results for input(s): TSH, T4TOTAL, FREET4, T3FREE, THYROIDAB in the last 72 hours. Anemia Panel: No results for input(s): VITAMINB12, FOLATE, FERRITIN, TIBC, IRON, RETICCTPCT in the last 72 hours. Sepsis Labs: Recent Labs  Lab 05/29/20 2030 05/29/20 2232 05/30/20 0300 05/30/20 0607  PROCALCITON 4.60  --   --   --   LATICACIDVEN 5.0* 4.9* 3.8* 2.7*    Recent Results (from the past 240 hour(s))  Blood Culture (routine x 2)     Status: None   Collection Time: 05/29/20 11:00 AM   Specimen: BLOOD  Result Value Ref Range Status   Specimen Description   Final    BLOOD LEFT ANTECUBITAL Performed at [facility], [place] [address], [place], [place] [address]    Special Requests   Final    BOTTLES DRAWN AEROBIC AND ANAEROBIC Blood Culture results may not be optimal due to an  inadequate volume of blood  received in culture bottles Performed at [place] [address], [place], [place] [address]    Culture   Final    NO GROWTH 5 DAYS Performed at [facility], [place] [address], [place], [place] [address]    Report Status 06/03/2020 FINAL  Final  Blood Culture (routine x 2)     Status: None   Collection Time: 05/29/20 11:12 AM   Specimen: BLOOD  Result Value Ref Range Status   Specimen Description   Final    BLOOD RIGHT ANTECUBITAL Performed at [place] [address] [place], [place] [address]    Special Requests   Final    BOTTLES DRAWN AEROBIC AND ANAEROBIC Blood Culture adequate volume Performed at [place] [address], [place], [place] [address]    Culture   Final    NO GROWTH 5 DAYS Performed at [facility], [place] [address] [place], [place] [address]    Report Status 06/03/2020 FINAL  Final  Resp Panel by RT-PCR (Flu A&B, Covid) Nasopharyngeal Swab     Status: None   Collection Time: 05/29/20 11:17 AM   Specimen: Nasopharyngeal Swab; Nasopharyngeal(NP) swabs in vial transport medium  Result Value Ref Range Status   SARS Coronavirus 2 by RT PCR NEGATIVE NEGATIVE Final    Comment: (NOTE) SARS-CoV-2 target nucleic acids are NOT DETECTED.  The SARS-CoV-2 RNA is generally detectable in upper respiratory specimens during the acute phase of infection. The lowest concentration of SARS-CoV-2 viral copies this assay can detect is 138 copies/mL. A negative result does not preclude SARS-Cov-2 infection and should not be used as the sole basis for treatment or other patient management decisions. A negative result may occur with  improper specimen collection/handling, submission of specimen other than nasopharyngeal swab, presence of viral mutation(s) within the areas targeted by this assay, and inadequate number of viral copies(<138 copies/mL). A negative result must be combined with clinical  observations, patient history, and epidemiological information. The expected result is Negative.  Fact Sheet for Patients:  EntrepreneurPulse.com.au  Fact Sheet for Healthcare Providers:  IncredibleEmployment.be  This test is no t yet approved or cleared by the [place] FDA and  has been authorized for detection and/or diagnosis of SARS-CoV-2 by FDA under an Emergency Use Authorization (EUA). This EUA will remain  in effect (meaning this test can be used) for the duration of the COVID-19 declaration under Section 564(b)(1) of the Act, 21 U.S.C.section 360bbb-3(b)(1), unless the authorization is terminated  or revoked sooner.       Influenza A by PCR NEGATIVE NEGATIVE Final   Influenza B by PCR NEGATIVE NEGATIVE Final    Comment: (NOTE) The Xpert Xpress SARS-CoV-2/FLU/RSV plus assay is intended as an aid in the diagnosis of influenza from Nasopharyngeal swab specimens and should not be used as a sole basis for treatment. Nasal washings and aspirates are unacceptable for Xpert Xpress SARS-CoV-2/FLU/RSV testing.  Fact Sheet for Patients: EntrepreneurPulse.com.au  Fact Sheet for Healthcare Providers: IncredibleEmployment.be  This test is not yet approved or cleared by the [place] FDA and has been authorized for detection and/or diagnosis of SARS-CoV-2 by FDA under an Emergency Use Authorization (EUA). This EUA will remain in effect (meaning this test can be used) for the duration of the COVID-19 declaration under Section 564(b)(1) of the Act, 21 U.S.C. section 360bbb-3(b)(1), unless the authorization is terminated or revoked.  Performed at [facility], [address] [place], [place] [address]   Urine culture     Status: None   Collection Time: 05/29/20 12:48 PM   Specimen: In/Out Cath Urine  Result Value Ref Range Status   Specimen Description   Final    IN/OUT CATH  URINE Performed at [facility], [place] [address], [place], [place] [address]    Special Requests   Final    NONE Performed at [facility], [place] [address], [place], [place] [address]    Culture   Final    NO GROWTH Performed at [facility], [place] [address], [place], [place] [address]    Report Status 05/30/2020 FINAL  Final  Body fluid culture (includes gram stain)     Status: None   Collection Time: 05/29/20  4:34 PM   Specimen: Peritoneal Washings; Peritoneal Fluid  Result Value Ref Range Status   Specimen Description   Final    PERITONEAL FLUID Performed at [facility], [place] [address], [place], [place] [address]    Special Requests   Final    NONE Performed at [facility], [place] [address], [place], [place] [address]    Gram Stain   Final    FEW WBC PRESENT, PREDOMINANTLY MONONUCLEAR NO ORGANISMS SEEN    Culture   Final    NO GROWTH 3 DAYS Performed at [place] [address] [place], [place] [address]    Report Status 06/02/2020 FINAL  Final  MRSA PCR Screening     Status: None   Collection Time: 05/30/20  1:26 AM   Specimen: Nasal Mucosa; Nasopharyngeal  Result Value Ref Range Status   MRSA by PCR NEGATIVE NEGATIVE Final    Comment:        The GeneXpert MRSA Assay (FDA approved for NASAL specimens only), is one component of a comprehensive MRSA colonization surveillance program. It is not intended to diagnose MRSA infection nor to guide or monitor treatment for MRSA infections. Performed at [facility], [place] [address] [place], [place] [address]   Culture, body fluid-bottle     Status: None (Preliminary result)   Collection Time: 06/02/20  6:42 PM   Specimen: Peritoneal Washings  Result Value Ref Range Status   Specimen Description PERITONEAL  Final   Special Requests NONE  Final   Culture   Final    NO GROWTH 2 DAYS Performed at [place] [address], [place], [place] [address]    Report Status PENDING  Incomplete  Gram stain     Status: None   Collection Time: 06/02/20  6:42 PM   Specimen: Peritoneal Washings  Result Value Ref Range Status   Specimen Description PERITONEAL  Final   Special Requests NONE  Final   Gram Stain   Final    FEW WBC PRESENT, PREDOMINANTLY MONONUCLEAR NO ORGANISMS SEEN Performed at [facility], [place] [address] [place], [place] [address]    Report Status 06/04/2020 FINAL  Final     Radiology Studies: No results found.  Scheduled Meds:  (feeding supplement) PROSource Plus  30 mL Oral BID BM   vitamin C  500 mg Oral Daily   Chlorhexidine Gluconate Cloth  6 each Topical Q0600   feeding supplement  1 Container Oral BID AC   furosemide  20 mg Oral Daily   hydrocortisone  1 application Rectal BID   insulin aspart  0-9 Units Subcutaneous Q4H  mouth rinse  15 mL Mouth Rinse BID   metoprolol tartrate  12.5 mg Oral BID   multivitamin with minerals  1 tablet Oral Daily   nutrition supplement (JUVEN)  1 packet Oral BID BM   pantoprazole  20 mg Oral Daily   sodium chloride flush  10-40 mL Intracatheter Q12H   zinc sulfate  220 mg Oral Daily   Continuous Infusions:  albumin human     cefTRIAXone (ROCEPHIN)  IV 2 g (06/05/20 1523)     LOS: 7 days   [name], MD [facility] Pager On Amion  If 7PM-7AM, please contact night-coverage 06/05/2020, 4:23 PM

## 2020-06-05 NOTE — Progress Notes (Addendum)
[facility] Progress Note  CC:  SBP  Subjective: No Nausea or vomiting. No abdominal pain. She reports having loose stools at home, since the end of last week her diarrhea has worsened, more frequent as translated by her daughter in law. No bloody diarrhea. Rectal tube in use, draining a moderate amount of brown watery stool. She is tolerating a clear liquid diet.    Objective:   S/P Paracentesis 05/29/2020: 60cc peritoneal fluid removed  PMN count 8410. + SBP Cytology pending  S/P Paracentesis 06/02/2020: Successful ultrasound-guided diagnostic and therapeutic paracentesis yielding 1 liter of peritoneal fluid. PMN count count 3726 SAAG > 1.1 T. Protein < 3.0.  Vital signs in last 24 hours: Temp:  [97.7 F (36.5 C)-98.3 F (36.8 C)] 97.7 F (36.5 C) (12/20 0408) Pulse Rate:  [74-81] 74 (12/20 0408) Resp:  [15-16] 16 (12/20 0408) BP: (150-153)/(75-82) 151/75 (12/20 0408) SpO2:  [97 %-100 %] 100 % (12/20 0408) Last BM Date: 06/04/20 General:   Alert in NAD. Eyes: No scleral icterus.  Heart: RRR, no murmur.  Pulm:  Breath sounds clear, few crackles in the left base.  Abdomen: Soft, mildly distended. Nontender. No significant ascites appreciated. + BS x 4 quads. Extremities: 1+  bilateral LE edema.  Neurologic:  Alert and  oriented x4;  grossly normal neurologically. Psych:  Alert and cooperative. Normal mood and affect.  Intake/Output from previous day: 12/19 0701 - 12/20 0700 In: 1139 [P.O.:960; IV Piggyback:179] Out: 1975 [Urine:650; Stool:1325] Intake/Output this shift: No intake/output data recorded.  Lab Results: Recent Labs    06/04/20 0606 06/05/20 0407  WBC 11.4* 10.3  HGB 7.6* 7.2*  HCT 22.2* 21.0*  PLT 80* 76*   BMET Recent Labs    06/03/20 0500 06/04/20 0606 06/05/20 0407  NA 139 142 142  K 3.6 3.9 4.0  CL 106 108 109  CO2 25 26 27   GLUCOSE 116* 128* 117*  BUN 21 18 18   CREATININE 0.57 0.46 0.33*  CALCIUM 7.4* 7.7*  7.5*   LFT Recent Labs    06/05/20 0407  PROT 4.5*  ALBUMIN 2.1*  AST 62*  ALT 21  ALKPHOS 272*  BILITOT 1.8*   PT/INR No results for input(s): LABPROT, INR in the last 72 hours. Hepatitis Panel No results for input(s): HEPBSAG, HCVAB, HEPAIGM, HEPBIGM in the last 72 hours.  No results found.  Assessment / Plan:  29. 74 year old female withunresectablepancreatic adenocarcinoma on palliative chemotherapy, [place] invasion of the SMV, agastrojejunostomywas completed. [facility] pain andnew onsetascites.Elevated LFTs. Acute hepatitis panel negative.S/P paracentesis 12/13positive for SBP with PMN count 8,410. Cytology pending.  She received Albumin IV 100gmon 12/14 and 12/15. On Zosyn then switched to Rocephin 12/14.Repeat paracentesis 12/17, 1 L peritoneal fluid removed. PMN count 3,726. 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  Urine protein negative. Albumin 2.1. Received additional dose of Albumin 25gm today. Cytology pending. T. Bili, alk phos and LFTs drifting downward.  -Continue Rocephin 1gm IV for now (started on 12/14, further recommendations per Dr. Henrene Pastor - ? liver doppler to further assess for portal HTN -Recommend ECHO to assess for right heart failure -CMP in am -Await further recommendations from registered dietician   2. Sepsis secondary to left lower lobe pneumonia and SBP. Query ascending cholangitis component as CTAP 12/13 identified a CBDstent (placedin Jan [facility] Feb [facility] [place] due toobstructive jaundicesecondary to herpancreatic mass). CTAP12/13 alsoshowed herpancreatic  head mass to be slightly smaller when compared to prior image study.She remains afebrile. Leukocytosis improving. Today WBC 10.3. -Oncology following -Supportive care  3. 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 Patient is a Jehovah  witness therefore she declines any blood products. Hg 7.2. Iron 50. Vitamin B12 greater than 7500. No overt signs of GI bleeding.  4. Diarrhea. Patient and family report she had diarrhea prior to hospital admission which has worsened over the past few days. She is afebrile. Leukocytosis improving on Zosyn. -C. Diff PCR  Active Problems:   Pancreatic cancer (HCC)   Hypertension   DM (diabetes mellitus), type 2 (HCC)   Ascites   Sepsis (HCC)   Acute lower UTI   Elevated LFTs   Thrombocytopenia (HCC)   SBP (spontaneous bacterial peritonitis) ([place])   Hypoglycemia without diagnosis of diabetes mellitus   Pressure injury of skin   Abdominal pain   Elevated INR   Liver failure without hepatic coma ([place])     LOS: 7 days   Tamara Wood  06/05/2020, 7:40 AM  GI ATTENDING  Interval history and data reviewed.  Agree with interval progress note as outlined above.  Patient with multiple medical problems as outlined including unresectable pancreatic cancer, biliary obstruction with prior stent placement, new ascites with SBP, and pneumonia.  New complaint is worsening diarrhea.  Will check C. difficile.  Continue antibiotics for SBP.  Overall prognosis remains poor.  Docia Chuck. Geri [place]., M.D. [facility] of Gastroenterology '

## 2020-06-06 ENCOUNTER — Inpatient Hospital Stay (HOSPITAL_COMMUNITY): Payer: 59

## 2020-06-06 DIAGNOSIS — J9601 Acute respiratory failure with hypoxia: Secondary | ICD-10-CM

## 2020-06-06 LAB — ECHOCARDIOGRAM COMPLETE
Area-P 1/2: 3.53 cm2
Calc EF: 65.2 %
Height: 64 in
S' Lateral: 2.3 cm
Single Plane A2C EF: 60.4 %
Single Plane A4C EF: 69.6 %
Weight: 2587.2 oz

## 2020-06-06 LAB — CBC
HCT: 24.2 % — ABNORMAL LOW (ref 36.0–46.0)
Hemoglobin: 8.3 g/dL — ABNORMAL LOW (ref 12.0–15.0)
MCH: 36.2 pg — ABNORMAL HIGH (ref 26.0–34.0)
MCHC: 34.3 g/dL (ref 30.0–36.0)
MCV: 105.7 fL — ABNORMAL HIGH (ref 80.0–100.0)
Platelets: 92 10*3/uL — ABNORMAL LOW (ref 150–400)
RBC: 2.29 MIL/uL — ABNORMAL LOW (ref 3.87–5.11)
RDW: 25.2 % — ABNORMAL HIGH (ref 11.5–15.5)
WBC: 14.9 10*3/uL — ABNORMAL HIGH (ref 4.0–10.5)
nRBC: 0.3 % — ABNORMAL HIGH (ref 0.0–0.2)

## 2020-06-06 LAB — COMPREHENSIVE METABOLIC PANEL
ALT: 23 U/L (ref 0–44)
AST: 59 U/L — ABNORMAL HIGH (ref 15–41)
Albumin: 2.3 g/dL — ABNORMAL LOW (ref 3.5–5.0)
Alkaline Phosphatase: 311 U/L — ABNORMAL HIGH (ref 38–126)
Anion gap: 6 (ref 5–15)
BUN: 19 mg/dL (ref 8–23)
CO2: 25 mmol/L (ref 22–32)
Calcium: 7.8 mg/dL — ABNORMAL LOW (ref 8.9–10.3)
Chloride: 108 mmol/L (ref 98–111)
Creatinine, Ser: 0.51 mg/dL (ref 0.44–1.00)
GFR, Estimated: >60 mL/min (ref >60)
Glucose, Bld: 90 mg/dL (ref 70–99)
Potassium: 3.5 mmol/L (ref 3.5–5.1)
Sodium: 139 mmol/L (ref 135–145)
Total Bilirubin: 2.1 mg/dL — ABNORMAL HIGH (ref 0.3–1.2)
Total Protein: 5.2 g/dL — ABNORMAL LOW (ref 6.5–8.1)

## 2020-06-06 LAB — GLUCOSE, CAPILLARY
Glucose-Capillary: 62 mg/dL — ABNORMAL LOW (ref 70–99)
Glucose-Capillary: 81 mg/dL (ref 70–99)
Glucose-Capillary: 100 mg/dL — ABNORMAL HIGH (ref 70–99)
Glucose-Capillary: 137 mg/dL — ABNORMAL HIGH (ref 70–99)
Glucose-Capillary: 192 mg/dL — ABNORMAL HIGH (ref 70–99)
Glucose-Capillary: 182 mg/dL — ABNORMAL HIGH (ref 70–99)

## 2020-06-06 LAB — BODY FLUID CELL COUNT WITH DIFFERENTIAL
Lymphs, Fluid: 11 %
Monocyte-Macrophage-Serous Fluid: 24 % — ABNORMAL LOW (ref 50–90)
Neutrophil Count, Fluid: 65 % — ABNORMAL HIGH (ref 0–25)
Total Nucleated Cell Count, Fluid: 248 cu mm (ref 0–1000)

## 2020-06-06 LAB — CYTOLOGY - NON PAP

## 2020-06-06 MED ORDER — LIDOCAINE HCL 1 % IJ SOLN
INTRAMUSCULAR | Status: AC
  Filled 2020-06-06: qty 20

## 2020-06-06 NOTE — [place] FL2 (Signed)
[place] MEDICAID FL2 LEVEL OF CARE SCREENING TOOL     IDENTIFICATION  Patient Name: Tamara Wood Birthdate: 1945-10-05 Sex: female Admission Date (Current Location): 05/29/2020  [facility] [place] Number:  Herbalist and Address:  [facility],  [address], [place]      Provider Number: [id number]  Attending Physician Name and Address:  Donne Hazel, MD  Relative Name and Phone Number:       Current Level of Care: Hospital Recommended Level of Care: [place] Prior Approval Number:    Date Approved/Denied:   PASRR Number:    Discharge Plan: SNF    Current Diagnoses: Patient Active Problem List   Diagnosis Date Noted   Elevated INR    Liver failure without hepatic coma (HCC)    Abdominal pain    SBP (spontaneous bacterial peritonitis) ([place]) 05/30/2020   Hypoglycemia without diagnosis of diabetes mellitus 05/30/2020   Pressure injury of skin 05/30/2020   Ascites 05/29/2020   Sepsis ([place]) 05/29/2020   Acute lower UTI 05/29/2020   Elevated LFTs 05/29/2020   Thrombocytopenia ([place]) 05/29/2020   Lactic acidosis    Port-A-Cath in place 12/16/2019   Goals of care, counseling/discussion 11/30/2019   Hypertension 11/30/2019   DM (diabetes mellitus), type 2 ([place]) 11/30/2019   Pancreatic cancer ([place]) 11/21/2019    Orientation RESPIRATION BLADDER Height & Weight     Self,Time,Situation,Place  Normal Incontinent Weight: 73.3 kg Height:  5\' 4"  (162.6 cm)  BEHAVIORAL SYMPTOMS/MOOD NEUROLOGICAL BOWEL NUTRITION STATUS      Incontinent Diet (Regular)  AMBULATORY STATUS COMMUNICATION OF NEEDS Skin   Extensive Assist Verbally (Spanish speaking) Normal                       Personal Care Assistance Level of Assistance  Bathing,Dressing Bathing Assistance: Limited assistance   Dressing Assistance: Limited assistance     Functional Limitations Info  Sight,Hearing,Speech Sight Info:  Adequate Hearing Info: Adequate Speech Info: Adequate    SPECIAL CARE FACTORS FREQUENCY  PT (By licensed PT),OT (By licensed OT)     PT Frequency: 5 x weekly OT Frequency: 5 x weekly            Contractures Contractures Info: Not present    Additional Factors Info  Code Status,Allergies Code Status Info: Partial Code Allergies Info: Shellfish           Current Medications (06/06/2020):  This is the current hospital active medication list Current Facility-Administered Medications  Medication Dose Route Frequency Provider Last Rate Last Admin   (feeding supplement) PROSource Plus liquid 30 mL  30 mL Oral BID BM Donne Hazel, MD   30 mL at 06/05/20 1227   albuterol (PROVENTIL) (2.5 MG/3ML) 0.083% nebulizer solution 2.5 mg  2.5 mg Nebulization Q2H PRN Donne Hazel, MD       ascorbic acid (VITAMIN C) tablet 500 mg  500 mg Oral Daily Donne Hazel, MD   500 mg at 06/05/20 1107   cefTRIAXone (ROCEPHIN) 2 g in sodium chloride 0.9 % 100 mL IVPB  2 g Intravenous Q24H Donne Hazel, MD 200 mL/hr at 06/05/20 1523 2 g at 06/05/20 1523   Chlorhexidine Gluconate Cloth 2 % PADS 6 each  6 each Topical Q0600 Donne Hazel, MD   6 each at 06/06/20 0514   feeding supplement (BOOST / RESOURCE BREEZE) liquid 1 Container  1 Container Oral BID AC Donne Hazel, MD  1 Container at 06/05/20 1740   furosemide (LASIX) tablet 20 mg  20 mg Oral Daily Donne Hazel, MD   20 mg at 06/05/20 1107   hydrocortisone (ANUSOL-HC) 2.5 % rectal cream 1 application  1 application Rectal BID Donne Hazel, MD   1 application at [id number] 2200   insulin aspart (novoLOG) injection 0-9 Units  0-9 Units Subcutaneous Q4H Donne Hazel, MD   3 Units at 06/06/20 0113   MEDLINE mouth rinse  15 mL Mouth Rinse BID Donne Hazel, MD   15 mL at 06/05/20 2200   metoprolol tartrate (LOPRESSOR) tablet 12.5 mg  12.5 mg Oral BID Donne Hazel, MD   12.5 mg at 06/05/20 2157   multivitamin with  minerals tablet 1 tablet  1 tablet Oral Daily Donne Hazel, MD   1 tablet at 06/05/20 1107   nutrition supplement (JUVEN) (JUVEN) powder packet 1 packet  1 packet Oral BID BM Donne Hazel, MD   1 packet at 06/05/20 1741   pantoprazole (PROTONIX) EC tablet 20 mg  20 mg Oral Daily Donne Hazel, MD   20 mg at 06/05/20 1108   sodium chloride flush (NS) 0.9 % injection 10-40 mL  10-40 mL Intracatheter Q12H Donne Hazel, MD   10 mL at 06/04/20 0943   sodium chloride flush (NS) 0.9 % injection 10-40 mL  10-40 mL Intracatheter PRN Donne Hazel, MD       zinc sulfate capsule 220 mg  220 mg Oral Daily Donne Hazel, MD   220 mg at 06/05/20 1106     Discharge Medications: Please see discharge summary for a list of discharge medications.  Relevant Imaging Results:  Relevant Lab Results:   Additional Information [id number]  Jiro Kiester, Marjie Skiff, RN

## 2020-06-06 NOTE — Procedures (Signed)
Ultrasound-guided diagnostic  paracentesis performed yielding 150 cc of clear,yellow fluid. No immediate complications.  The fluid was submitted to the lab for preordered studies.  EBL none.

## 2020-06-06 NOTE — Progress Notes (Signed)
[facility] Progress Note  CC:  SBP  Subjective:  No nausea and vomiting, no abdominal pain.  Still diarrhea in rectal tube bag, but looks like firming a little.  Objective:  Vital signs in last 24 hours: Temp:  [98.1 F (36.7 C)-98.4 F (36.9 C)] 98.2 F (36.8 C) (12/21 0422) Pulse Rate:  [77-100] 77 (12/21 0422) Resp:  [16-17] 16 (12/21 0422) BP: (117-159)/(67-87) 118/67 (12/21 0422) SpO2:  [97 %-100 %] 100 % (12/21 0422) Last BM Date: 06/05/20 General:  Alert, chronically ill-appearing in NAD Heart:  Regular rate and rhythm; no murmurs Pulm:  CTAB.  No W/R/R. Abdomen:  Soft, minimal distention.  BS present.  Non-tender. Extremities:  Without edema. Neurologic:  Alert and oriented x 4;  grossly normal neurologically. Psych:  Alert and cooperative. Normal mood and affect.  S/P Paracentesis 05/29/2020: 60cc peritoneal fluid removed  PMN count 8410. + SBP Cytology pending  S/P Paracentesis 06/02/2020: Successful ultrasound-guided diagnostic and therapeutic paracentesis yielding 1 liter of peritoneal fluid. PMN count count 3726 SAAG > 1.1 T. Protein < 3.0.  Intake/Output from previous day: 12/20 0701 - 12/21 0700 In: 840 [P.O.:840] Out: 500 [Urine:200; Stool:300]  Lab Results: Recent Labs    06/04/20 0606 06/05/20 0407 06/06/20 0511  WBC 11.4* 10.3 14.9*  HGB 7.6* 7.2* 8.3*  HCT 22.2* 21.0* 24.2*  PLT 80* 76* 92*   BMET Recent Labs    06/04/20 0606 06/05/20 0407 06/06/20 0511  NA 142 142 139  K 3.9 4.0 3.5  CL 108 109 108  CO2 26 27 25   GLUCOSE 128* 117* 90  BUN 18 18 19   CREATININE 0.46 0.33* 0.51  CALCIUM 7.7* 7.5* 7.8*   LFT Recent Labs    06/06/20 0511  PROT 5.2*  ALBUMIN 2.3*  AST 59*  ALT 23  ALKPHOS 311*  BILITOT 2.1*   Assessment / Plan: 55. 74 year old female withunresectablepancreatic adenocarcinoma on palliative chemotherapy[place] invasion of [place]  SMV, agastrojejunostomywas completed. [facility] pain andnew onsetascites.Elevated LFTs. Acute hepatitis panel negative.S/P paracentesis 12/13positive for SBP with PMN count 8,410. Cytology without malignant cells.She received Albumin IV100gmon 12/14 and 12/15. On Zosyn then switched to Rocephin 12/14.Repeat paracentesis 12/17, 1 L peritoneal fluid removed. PMN count 3,726. 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  Urine protein negative. Albumin 2.1. Received additional dose of Albumin 25gm 12/20. Cytology pending. T. Bili, alk phos and LFTs overall about stable.  -Continue Rocephin 1gm IV for now (started on 12/14) - ? liver doppler to further assess for portal HTN -Follow-up ECHO to assess for right heart failure -Will need prophylaxis for SBP in the future.  2. Sepsis secondary to left lower lobe pneumonia and SBP. Query ascending cholangitis component as CTAP 12/13 identified a CBDstent (placedin Jan [facility] Feb [facility] [place] due toobstructive jaundicesecondary to herpancreatic mass).CTAP12/13 alsoshowed herpancreatic head mass to be slightly smaller when compared to prior image study.She remains afebrile.WBC count up some today at 14.9K. -Oncology following -Supportive care, on abx.  3. 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 Patient is a Jehovah witness therefore she declines any blood products. Hg 8.3 grams today. Iron 50. Vitamin B12 greater than 7500. No overt signs of GI bleeding.  4. Diarrhea. Patient and family report she had diarrhea prior to hospital admission which has worsened over the past few days.  Cdiff negative.  ? Secondary to chemo.  Also does not  have a gallbladder. -? Add colestid to help firm the stool vs Imodium.    LOS: 8 days   Laban Emperor. Catalino Plascencia  06/06/2020, 9:37 AM

## 2020-06-06 NOTE — Progress Notes (Signed)
PROGRESS NOTE    [name]  [id number] DOB: 1946/03/22 DOA: 05/29/2020 PCP: Isaac Bliss, Rayford Halsted, MD    Brief Narrative:  74 year old female with history of pancreatic cancer currently on palliative chemotherapy, diabetes type 2, hypertension, Jehovah's Witness who presented with weakness, abdominal pain.  She was also having nausea and vomiting and some loose stools at home.  She was diagnosed with pancreatic cancer about a year ago and underwent Whipple procedure in March 2021 but could not complete secondary to metastatic disease and instead underwent gastrojejunal bypass.  She is currently on palliative chemotherapy,follows with Dr Burr Medico .On presentation she was hypotensive, tachycardic.  Patient was admitted for the management of severe sepsis.  Found to have pneumonia and SBP.  Assessment & Plan:   Active Problems:   Pancreatic cancer ([place])   Hypertension   DM (diabetes mellitus), type 2 (HCC)   Ascites   Sepsis ([place])   Acute lower UTI   Elevated LFTs   Thrombocytopenia (HCC)   SBP (spontaneous bacterial peritonitis) ([place])   Hypoglycemia without diagnosis of diabetes mellitus   Pressure injury of skin   Abdominal pain   Elevated INR   Liver failure without hepatic coma (HCC)   Severe sepsis with SBP present on admit:  -Presented with hypotension, tachycardia, lactic acidosis, elevated white cell counts.   -Ascitic fluid analysis suggestive of SBP .imaging showed left middle/lower zone infiltrate suspicios for pneumonia. Pt has been continued on broad spec abx. WBC currently 14.9k -Per GI recommendation for repeat diagnostic paracentesis to ensure improvement in peritonitis -Per GI, will ultimately need long-term antibiotic prophylaxis for recurrent SBP  SBP:  -Pt underwent diagnostic paracentesis in the emergency department with finding of SBP(total neutrophils of 9450).   -Pt has been continued on rocephin, since 12/13.  -Pt underwent repeat paracentesis  yielding 1L fluid on 12/17. Fluid with 4100 WBC's with 90% neutrophils -Underwent repeat diagnostic paracentesis 12/21 with 248 WBC's with 65% neutrophils -Fluid cytology from 12/14 and 12/17 both confirm inflammatory cells  Acute hypoxic respiratory failure:  -Currently on [facility] -Recent chest imaging showed bilateral pleural effusions with compression atelectasis, possibility of infiltrate on the left lung.  Continue supplemental oxygen as needed.  -cont to wean O2 as tolerated  Advanced pancreatic cancer:  -Follows with oncology, Dr. Burr Medico on palliative chemotherapy.   -CT imagings did not show progression of her malignancy, but did demonstrate hepatic steatosis.  She is status post biliary stent.  -Pt noted to have elevated ALP and mild elevated liver enzymes along with total bilirubin. -Last chemo was on 12/2.   -Oncology is following. Ascitic cytology is notable for inflammatory cells without evidence of malignancy  Anasarca:  -Presented with abdominal distention,severe lower extremity edema, bilateral pleural effusion.  Most likely this is from hypoalbuminemia.   -Cont on lasix as tolerated  Underwent multiple paracenteses per above  Thrombocytopenia:  -Suspect secondary to recent chemo.  -Platelets now trending up to 92k today  Macrocytic anemia:  -No evidence of acute blood loss.  -Pt cannot receive blood given religous beliefs.  -Oncology is following and pt received aranesp on 12/15.  -Hgb is now trending up, currently 8.3 -Retic count is elevated at 4.8%  Hypomagnesemia:  -Had normalized as of 12/18 -Would continue to follow lytes and replace as needed  UTI:  -She was recently started treatment for urinary tract infection and was on Cipro. -Urine cx pos for ecoli species that are pan-sensitive with exception of ampicillin and bactrim. -On rocephin as per  above  Severe protein calorie malnutrition:  -Has severe hypoalbuminemia.   -Nutrition was  consulted  Hypoglycemia:  -Continue to monitor CBGs. -Glucose trends are now stable  Diarrhea -Now with rectal tube -Cdiff was ordered by GI and is neg -Per GI, question if diarrhea is related to chemo -GI considering adding colestid vs imodium  DVT prophylaxis: SCD's Code Status: No intubation Family Communication: Pt in room, family currently at bedside  Status is: Inpatient  Remains inpatient appropriate because:Hemodynamically unstable, Ongoing diagnostic testing needed not appropriate for outpatient work up, IV treatments appropriate due to intensity of illness or inability to take PO and Inpatient level of care appropriate due to severity of illness   Dispo: The patient is from: Home              Anticipated d/c is to: SNF              Anticipated d/c date is: 3 days              Patient currently is not medically stable to d/c.   Consultants:   Oncology  GI  General Surgery  Procedures:   Paracentesis  Antimicrobials: Anti-infectives (From admission, onward)   Start     Dose/Rate Route Frequency Ordered Stop   05/30/20 1600  vancomycin (VANCOCIN) IVPB 1000 mg/200 mL premix  Status:  Discontinued        1,000 mg 200 mL/hr over 60 Minutes Intravenous Every 24 hours 05/29/20 2021 05/30/20 1058   05/30/20 1445  cefTRIAXone (ROCEPHIN) 2 g in sodium chloride 0.9 % 100 mL IVPB        2 g 200 mL/hr over 30 Minutes Intravenous Every 24 hours 05/30/20 1354     05/29/20 2200  piperacillin-tazobactam (ZOSYN) IVPB 3.375 g  Status:  Discontinued        3.375 g 12.5 mL/hr over 240 Minutes Intravenous Every 8 hours 05/29/20 2026 05/30/20 1354   05/29/20 1600  vancomycin (VANCOREADY) IVPB 1250 mg/250 mL        1,250 mg 166.7 mL/hr over 90 Minutes Intravenous  Once 05/29/20 1551 05/29/20 2057   05/29/20 1545  piperacillin-tazobactam (ZOSYN) IVPB 3.375 g        3.375 g 100 mL/hr over 30 Minutes Intravenous  Once 05/29/20 1533 05/29/20 1635   05/29/20 1230  azithromycin  (ZITHROMAX) 500 mg in sodium chloride 0.9 % 250 mL IVPB        500 mg 250 mL/hr over 60 Minutes Intravenous  Once 05/29/20 1228 05/29/20 1555   05/29/20 1015  cefTRIAXone (ROCEPHIN) 1 g in sodium chloride 0.9 % 100 mL IVPB  Status:  Discontinued        1 g 200 mL/hr over 30 Minutes Intravenous Every 24 hours 05/29/20 1014 05/29/20 2026      Subjective: Without complaints this AM. Denies abd pain  Objective: Vitals:   06/06/20 0422 06/06/20 1225 06/06/20 1457 06/06/20 1500  BP: 118/67 129/78 (!) 142/76 134/72  Pulse: 77 76 81 77  Resp: 16 20    Temp: 98.2 F (36.8 C) 97.9 F (36.6 C)    TempSrc: Oral Oral    SpO2: 100% 99% 100% 100%  Weight:      Height:        Intake/Output Summary (Last 24 hours) at 06/06/2020 1643 Last data filed at 06/06/2020 1008 Gross per 24 hour  Intake 130 ml  Output 500 ml  Net -370 ml   Filed Weights   05/29/20 0911 05/30/20 0127  06/02/20 1900  Weight: 56.2 kg 67.9 kg 73.3 kg    Examination: General exam: Conversant, in no acute distress Respiratory system: normal chest rise, clear, no audible wheezing Cardiovascular system: regular rhythm, s1-s2 Gastrointestinal system: Nondistended, nontender, pos BS Central nervous system: No seizures, no tremors Extremities: No cyanosis, no joint deformities Skin: No rashes, no pallor Psychiatry: Affect normal // no auditory hallucinations   Data Reviewed: I have personally reviewed following labs and imaging studies  CBC: Recent Labs  Lab 05/31/20 0554 06/02/20 0407 06/04/20 0606 06/05/20 0407 06/06/20 0511  WBC 16.9* 13.0* 11.4* 10.3 14.9*  NEUTROABS 14.8* 11.4*  --   --   --   HGB 6.1* 7.5* 7.6* 7.2* 8.3*  HCT 17.7* 21.9* 22.2* 21.0* 24.2*  MCV 101.1* 101.9* 102.3* 102.9* 105.7*  PLT 73* 85* 80* 76* 92*   Basic Metabolic Panel: Recent Labs  Lab 05/31/20 0554 06/01/20 0500 06/01/20 1750 06/02/20 0407 06/03/20 0500 06/04/20 0606 06/05/20 0407 06/06/20 0511  NA 140  --  141 141  139 142 142 139  K 3.5  --  2.6* 3.3* 3.6 3.9 4.0 3.5  CL 107  --  108 107 106 108 109 108  CO2 23  --  22 25 25 26 27 25   GLUCOSE 104*  --  149* 88 116* 128* 117* 90  BUN 17  --  18 18 21 18 18 19   CREATININE 1.11*   < > 0.70 0.62 0.57 0.46 0.33* 0.51  CALCIUM 7.7*  --  7.6* 7.7* 7.4* 7.7* 7.5* 7.8*  MG 2.0  --  1.4*  --  1.9  --   --   --    < > = values in this interval not displayed.   GFR: Estimated Creatinine Clearance: 59.6 mL/min (by C-G formula based on SCr of 0.51 mg/dL). Liver Function Tests: Recent Labs  Lab 06/02/20 0407 06/03/20 0500 06/04/20 0606 06/05/20 0407 06/06/20 0511  AST 48* 43* 59* 62* 59*  ALT 19 17 20 21 23   ALKPHOS 285* 235* 299* 272* 311*  BILITOT 2.2* 2.1* 2.6* 1.8* 2.1*  PROT 4.9* 4.5* 4.9* 4.5* 5.2*  ALBUMIN 2.7* 2.5* 2.4* 2.1* 2.3*   No results for input(s): LIPASE, AMYLASE in the last 168 hours. No results for input(s): AMMONIA in the last 168 hours. Coagulation Profile: No results for input(s): INR, PROTIME in the last 168 hours. Cardiac Enzymes: No results for input(s): CKTOTAL, CKMB, CKMBINDEX, TROPONINI in the last 168 hours. BNP (last 3 results) No results for input(s): PROBNP in the last 8760 hours. HbA1C: No results for input(s): HGBA1C in the last 72 hours. CBG: Recent Labs  Lab 06/05/20 2357 06/06/20 0424 06/06/20 0514 06/06/20 0748 06/06/20 1221  GLUCAP 208* 62* 81 100* 137*   Lipid Profile: No results for input(s): CHOL, HDL, LDLCALC, TRIG, CHOLHDL, LDLDIRECT in the last 72 hours. Thyroid Function Tests: No results for input(s): TSH, T4TOTAL, FREET4, T3FREE, THYROIDAB in the last 72 hours. Anemia Panel: No results for input(s): VITAMINB12, FOLATE, FERRITIN, TIBC, IRON, RETICCTPCT in the last 72 hours. Sepsis Labs: No results for input(s): PROCALCITON, LATICACIDVEN in the last 168 hours.  Recent Results (from the past 240 hour(s))  Blood Culture (routine x 2)     Status: None   Collection Time: 05/29/20 11:00 AM    Specimen: BLOOD  Result Value Ref Range Status   Specimen Description   Final    BLOOD LEFT ANTECUBITAL Performed at [facility] [address] [place], [place]  [address]    Special Requests   Final    BOTTLES DRAWN AEROBIC AND ANAEROBIC Blood Culture results may not be optimal due to an inadequate volume of blood received in culture bottles Performed at [facility], [place] [address] [place], [place] [address]    Culture   Final    NO GROWTH 5 DAYS Performed at [facility], [place] [address], [place], [place] [address]    Report Status 06/03/2020 FINAL  Final  Blood Culture (routine x 2)     Status: None   Collection Time: 05/29/20 11:12 AM   Specimen: BLOOD  Result Value Ref Range Status   Specimen Description   Final    BLOOD RIGHT ANTECUBITAL Performed at [place] [address], [place], [place] [address]    Special Requests   Final    BOTTLES DRAWN AEROBIC AND ANAEROBIC Blood Culture adequate volume Performed at [facility] [place] [address] [place], [place] [address]    Culture   Final    NO GROWTH 5 DAYS Performed at [facility], [place] [address] [place], [place] [address]    Report Status 06/03/2020 FINAL  Final  Resp Panel by RT-PCR (Flu A&B, Covid) Nasopharyngeal Swab     Status: None   Collection Time: 05/29/20 11:17 AM   Specimen: Nasopharyngeal Swab; Nasopharyngeal(NP) swabs in vial transport medium  Result Value Ref Range Status   SARS Coronavirus 2 by RT PCR NEGATIVE NEGATIVE Final    Comment: (NOTE) SARS-CoV-2 target nucleic acids are NOT DETECTED.  The SARS-CoV-2 RNA is generally detectable in upper respiratory specimens during the acute phase of infection. The lowest concentration of SARS-CoV-2 viral copies this assay can detect is 138 copies/mL. A negative result does not preclude SARS-Cov-2 infection and should not be used as the sole basis for treatment  or other patient management decisions. A negative result may occur with  improper specimen collection/handling, submission of specimen other than nasopharyngeal swab, presence of viral mutation(s) within the areas targeted by this assay, and inadequate number of viral copies(<138 copies/mL). A negative result must be combined with clinical observations, patient history, and epidemiological information. The expected result is Negative.  Fact Sheet for Patients:  EntrepreneurPulse.com.au  Fact Sheet for Healthcare Providers:  IncredibleEmployment.be  This test is no t yet approved or cleared by the [place] FDA and  has been authorized for detection and/or diagnosis of SARS-CoV-2 by FDA under an Emergency Use Authorization (EUA). This EUA will remain  in effect (meaning this test can be used) for the duration of the COVID-19 declaration under Section 564(b)(1) of the Act, 21 U.S.C.section 360bbb-3(b)(1), unless the authorization is terminated  or revoked sooner.       Influenza A by PCR NEGATIVE NEGATIVE Final   Influenza B by PCR NEGATIVE NEGATIVE Final    Comment: (NOTE) The Xpert Xpress SARS-CoV-2/FLU/RSV plus assay is intended as an aid in the diagnosis of influenza from Nasopharyngeal swab specimens and should not be used as a sole basis for treatment. Nasal washings and aspirates are unacceptable for Xpert Xpress SARS-CoV-2/FLU/RSV testing.  Fact Sheet for Patients: EntrepreneurPulse.com.au  Fact Sheet for Healthcare Providers: IncredibleEmployment.be  This test is not yet approved or cleared by the [place] FDA and has been authorized for detection and/or diagnosis of SARS-CoV-2 by FDA under an Emergency Use Authorization (EUA). This EUA will remain in effect (meaning this test can be used) for the duration of the COVID-19  declaration under Section 564(b)(1) of the Act, 21 U.S.C. section  360bbb-3(b)(1), unless the authorization is terminated or revoked.  Performed at [facility], [place] [address] [place], [place] [address]   Urine culture     Status: None   Collection Time: 05/29/20 12:48 PM   Specimen: In/Out Cath Urine  Result Value Ref Range Status   Specimen Description   Final    IN/OUT CATH URINE Performed at [place] [address], [place], [place] [address]    Special Requests   Final    NONE Performed at [facility], [place] [address] [place], [place] [address]    Culture   Final    NO GROWTH Performed at [facility], [place] [address] [place], [place] [address]    Report Status 05/30/2020 FINAL  Final  Body fluid culture (includes gram stain)     Status: None   Collection Time: 05/29/20  4:34 PM   Specimen: Peritoneal Washings; Peritoneal Fluid  Result Value Ref Range Status   Specimen Description   Final    PERITONEAL FLUID Performed at [facility], [place] [address], [place], [place] [address]    Special Requests   Final    NONE Performed at [facility], [place] [address], [place], [place] [address]    Gram Stain   Final    FEW WBC PRESENT, PREDOMINANTLY MONONUCLEAR NO ORGANISMS SEEN    Culture   Final    NO GROWTH 3 DAYS Performed at [place] [address], [place], [place] [address]    Report Status 06/02/2020 FINAL  Final  MRSA PCR Screening     Status: None   Collection Time: 05/30/20  1:26 AM   Specimen: Nasal Mucosa; Nasopharyngeal  Result Value Ref Range Status   MRSA by PCR NEGATIVE NEGATIVE Final    Comment:        The GeneXpert MRSA Assay (FDA approved for NASAL specimens only), is one component of a comprehensive MRSA colonization surveillance program. It is not intended to diagnose MRSA infection nor to guide or monitor treatment for MRSA infections. Performed at [facility], [place] [address],  [place], [place] [address]   Culture, body fluid-bottle     Status: None (Preliminary result)   Collection Time: 06/02/20  6:42 PM   Specimen: Peritoneal Washings  Result Value Ref Range Status   Specimen Description PERITONEAL  Final   Special Requests NONE  Final   Culture   Final    NO GROWTH 3 DAYS Performed at [facility], [address] [place], [place] [address]    Report Status PENDING  Incomplete  Gram stain     Status: None   Collection Time: 06/02/20  6:42 PM   Specimen: Peritoneal Washings  Result Value Ref Range Status   Specimen Description PERITONEAL  Final   Special Requests NONE  Final   Gram Stain   Final    FEW WBC PRESENT, PREDOMINANTLY MONONUCLEAR NO ORGANISMS SEEN Performed at [facility], [place] [address] [place],  [address]    Report Status 06/04/2020 FINAL  Final  C Difficile Quick Screen (NO PCR Reflex)     Status: None   Collection Time: 06/05/20  1:33 PM   Specimen: STOOL  Result Value Ref Range Status   C Diff antigen NEGATIVE NEGATIVE Final   C Diff toxin NEGATIVE NEGATIVE Final   C Diff  interpretation No C. difficile detected.  Final    Comment: Performed at [facility], [place] [address] [place], [place] [address]     Radiology Studies: US Paracentesis  Result Date: 06/06/2020 INDICATION: Patient with history of pancreatic cancer, spontaneous bacterial peritonitis, sepsis, recurrent ascites. Request made for diagnostic paracentesis. EXAM: ULTRASOUND GUIDED DIAGNOSTIC PARACENTESIS MEDICATIONS: 1% lidocaine to skin and subcutaneous tissue COMPLICATIONS: None immediate. PROCEDURE: Informed written consent was obtained from the patient after a discussion of the risks, benefits and alternatives to treatment. A timeout was performed prior to the initiation of the procedure. Initial ultrasound scanning demonstrates a moderate amount of ascites within the left mid to lower abdominal quadrant. The left mid to lower abdomen  was prepped and draped in the usual sterile fashion. 1% lidocaine was used for local anesthesia. Following this, a 19 gauge, 10-cm, Yueh catheter was introduced. An ultrasound image was saved for documentation purposes. The paracentesis was performed. The catheter was removed and a dressing was applied. The patient tolerated the procedure well without immediate post procedural complication. FINDINGS: A total of approximately 150 cc of clear, yellow fluid was removed. Samples were sent to the laboratory as requested by the clinical team. IMPRESSION: Successful ultrasound-guided diagnostic paracentesis yielding 150 cc of peritoneal fluid. Read by: Rowe Robert, PA-C Electronically Signed   By: Markus Daft M.D.   On: 06/06/2020 15:39   ECHOCARDIOGRAM COMPLETE  Result Date: 06/06/2020    ECHOCARDIOGRAM REPORT   Patient Name:   Tamara BRAUTIGAN Date of Exam: 06/06/2020 Medical Rec #:  [id number]    Height:       64.0 in Accession #:    [id number]   Weight:       161.7 lb Date of Birth:  01-22-1946     BSA:          1.787 m Patient Age:    25 years     BP:           118/67 mmHg Patient Gender: F            HR:           79 bpm. Exam Location:  Inpatient Procedure: 2D Echo, Cardiac Doppler and Color Doppler Indications:    Study is to rule out right heart failure.; R06.02 SOB  History:        Patient has no prior history of Echocardiogram examinations.                 Abnormal ECG; Risk Factors:Diabetes and Hypertension. Pancreatic                 cancer. Ascites.  Sonographer:    Roseanna Rainbow RDCS Referring Phys: [id number] [place]  Sonographer Comments: Technically difficult study due to poor echo windows. IMPRESSIONS  1. Left ventricular ejection fraction, by estimation, is 65 to 70%. The left ventricle has normal function. The left ventricle has no regional wall motion abnormalities. There is mild left ventricular hypertrophy. Left ventricular diastolic parameters are consistent with Grade I diastolic  dysfunction (impaired relaxation).  2. Right ventricular systolic function is normal. The right ventricular size is normal. Tricuspid regurgitation signal is inadequate for assessing PA pressure.  3. Moderate pleural effusion in the left lateral region.  4. The mitral valve is grossly normal. Trivial mitral valve regurgitation.  5. The aortic valve is tricuspid. Aortic valve regurgitation is not visualized. Comparison(s): No prior Echocardiogram. FINDINGS  Left Ventricle: Left ventricular ejection fraction, by estimation, is 65  to 70%. The left ventricle has normal function. The left ventricle has no regional wall motion abnormalities. The left ventricular internal cavity size was normal in size. There is  mild left ventricular hypertrophy. Left ventricular diastolic parameters are consistent with Grade I diastolic dysfunction (impaired relaxation). Normal left ventricular filling pressure. Right Ventricle: The right ventricular size is normal. No increase in right ventricular wall thickness. Right ventricular systolic function is normal. Tricuspid regurgitation signal is inadequate for assessing PA pressure. Left Atrium: Left atrial size was normal in size. Right Atrium: Right atrial size was normal in size. Pericardium: There is no evidence of pericardial effusion. Mitral Valve: The mitral valve is grossly normal. Trivial mitral valve regurgitation. Tricuspid Valve: The tricuspid valve is grossly normal. Tricuspid valve regurgitation is trivial. Aortic Valve: The aortic valve is tricuspid. Aortic valve regurgitation is not visualized. Pulmonic Valve: The pulmonic valve was normal in structure. Pulmonic valve regurgitation is not visualized. Aorta: The aortic root and ascending aorta are structurally normal, with no evidence of dilitation. IAS/Shunts: No atrial level shunt detected by color flow Doppler. Additional Comments: There is a moderate pleural effusion in the left lateral region. Mild ascites is present.   LEFT VENTRICLE PLAX 2D LVIDd:         3.70 cm     Diastology LVIDs:         2.30 cm     LV e' medial:    7.94 cm/s LV PW:         1.30 cm     LV E/e' medial:  7.8 LV IVS:        1.20 cm     LV e' lateral:   11.00 cm/s LVOT diam:     1.90 cm     LV E/e' lateral: 5.6 LV SV:         62 LV SV Index:   35 LVOT Area:     2.84 cm  LV Volumes (MOD) LV vol d, MOD A2C: 56.1 ml LV vol d, MOD A4C: 52.0 ml LV vol s, MOD A2C: 22.2 ml LV vol s, MOD A4C: 15.8 ml LV SV MOD A2C:     33.9 ml LV SV MOD A4C:     52.0 ml LV SV MOD BP:      35.7 ml RIGHT VENTRICLE TAPSE (M-mode): 1.5 cm LEFT ATRIUM             Index       RIGHT ATRIUM          Index LA diam:        2.50 cm 1.40 cm/m  RA Area:     7.37 cm LA Vol (A2C):   24.5 ml 13.71 ml/m RA Volume:   12.10 ml 6.77 ml/m LA Vol (A4C):   21.2 ml 11.86 ml/m LA Biplane Vol: 24.2 ml 13.54 ml/m  AORTIC VALVE LVOT Vmax:   113.00 cm/s LVOT Vmean:  73.700 cm/s LVOT VTI:    0.220 m  AORTA Ao Root diam: 3.40 cm Ao Asc diam:  3.30 cm MITRAL VALVE MV Area (PHT): 3.53 cm    SHUNTS MV Decel Time: 215 msec    Systemic VTI:  0.22 m MV E velocity: 62.15 cm/s  Systemic Diam: 1.90 cm MV A velocity: 89.50 cm/s MV E/A ratio:  0.69 Lyman Bishop MD Electronically signed by Lyman Bishop MD Signature Date/Time: 06/06/2020/4:32:16 PM    Final     Scheduled Meds: . (feeding supplement) PROSource Plus  30 mL Oral BID  BM  . vitamin C  500 mg Oral Daily  . Chlorhexidine Gluconate Cloth  6 each Topical Q0600  . feeding supplement  1 Container Oral BID AC  . furosemide  20 mg Oral Daily  . hydrocortisone  1 application Rectal BID  . insulin aspart  0-9 Units Subcutaneous Q4H  . lidocaine      . mouth rinse  15 mL Mouth Rinse BID  . metoprolol tartrate  12.5 mg Oral BID  . multivitamin with minerals  1 tablet Oral Daily  . nutrition supplement (JUVEN)  1 packet Oral BID BM  . pantoprazole  20 mg Oral Daily  . sodium chloride flush  10-40 mL Intracatheter Q12H  . zinc sulfate  220 mg Oral Daily    Continuous Infusions: . cefTRIAXone (ROCEPHIN)  IV 2 g (06/06/20 1553)     LOS: 8 days   [name], MD [facility] Pager On Amion  If 7PM-7AM, please contact night-coverage 06/06/2020, 4:43 PM

## 2020-06-06 NOTE — Progress Notes (Signed)
  Echocardiogram 2D Echocardiogram has been performed.  Tamara Wood 06/06/2020, 2:44 PM

## 2020-06-06 NOTE — TOC Initial Note (Signed)
Transition of Care [facility]) - Initial/Assessment Note    Patient Details  Name: Tamara Wood MRN: [id number] Date of Birth: [birth year]  [facility]) CM/SW Contact:    Lynnell Catalan, RN Phone Number: 06/06/2020, 2:14 PM  Clinical Narrative:                 SNF was recommended by PT. Spoke with pt daughter in law [name] who is acting as Fish farm manager for the family. Permission was given to this CM to Fax out FL2 for SNF bed availability. SNF bed offers given to [name] and [employer] was chosen. Insurance auth done and approved by [employer] [phone]. Pt will need new Covid test prior to dc. TOC will continue to follow.  Expected Discharge Plan: Skilled Nursing Facility Barriers to Discharge: Continued Medical Work up   Patient Goals and CMS Choice Patient states their goals for this hospitalization and ongoing recovery are:: i would like to go home CMS Medicare.gov Compare Post Acute Care list provided to:: Patient Represenative (must comment) (Daughter in [place]) Choice offered to / list presented to : Adult Children  Expected Discharge Plan and Services Expected Discharge Plan: [place]   Discharge Planning Services: CM Consult Post Acute Care Choice: [place] Living arrangements for the past 2 months: Single Family Home                   Prior Living Arrangements/Services Living arrangements for the past 2 months: Single Family Home Lives with:: Relatives Patient language and need for interpreter reviewed:: Yes Do you feel safe going back to the place where you live?: Yes      Need for Family Participation in Patient Care: Yes (Comment) Care giver support system in place?: Yes (comment)   Criminal Activity/Legal Involvement Pertinent to Current Situation/Hospitalization: No - Comment as needed  Activities of Daily Living Home Assistive Devices/Equipment: Eyeglasses,Walker (specify type) ADL Screening (condition at time  of admission) Patient's cognitive ability adequate to safely complete daily activities?: Yes Is the patient deaf or have difficulty hearing?: No Does the patient have difficulty seeing, even when wearing glasses/contacts?: Yes Does the patient have difficulty concentrating, remembering, or making decisions?: No Patient able to express need for assistance with ADLs?: Yes Does the patient have difficulty dressing or bathing?: No Independently performs ADLs?: Yes (appropriate for developmental age) Does the patient have difficulty walking or climbing stairs?: Yes Weakness of Legs: Both Weakness of Arms/Hands: None  Permission Sought/Granted   Permission granted to share information with : Yes, Verbal Permission Granted              Emotional Assessment Appearance:: Appears stated age Attitude/Demeanor/Rapport: Engaged Affect (typically observed): Calm Orientation: : Oriented to Place,Oriented to Self,Oriented to  Time,Oriented to Situation Alcohol / Substance Use: Not Applicable Psych Involvement: No (comment)  Admission diagnosis:  Lactic acidosis [E87.2] Hypoglycemia [E16.2] Elevated INR [R79.1] Near syncope [R55] Septic shock (HCC) [A41.9, R65.21] Sepsis ([place]) [A41.9] Abdominal pain, unspecified abdominal location [R10.9] Liver failure without hepatic coma, unspecified chronicity ([place]) [K72.90] Patient Active Problem List   Diagnosis Date Noted  . Elevated INR   . Liver failure without hepatic coma ([place])   . Abdominal pain   . SBP (spontaneous bacterial peritonitis) ([place]) 05/30/2020  . Hypoglycemia without diagnosis of diabetes mellitus 05/30/2020  . Pressure injury of skin 05/30/2020  . Ascites 05/29/2020  . Sepsis ([place]) 05/29/2020  . Acute lower UTI 05/29/2020  . Elevated LFTs 05/29/2020  .  Thrombocytopenia ([place]) 05/29/2020  . Lactic acidosis   . Port-A-Cath in place 12/16/2019  . Goals of care, counseling/discussion 11/30/2019  . Hypertension 11/30/2019  . DM  (diabetes mellitus), type 2 ([place]) 11/30/2019  . Pancreatic cancer ([place]) 11/21/2019   PCP:  Isaac Bliss, Rayford Halsted, MD Pharmacy:   [id number] [address], [place] [address] Phone: [phone] Fax: [phone]     Social Determinants of Health (SDOH) Interventions    Readmission Risk Interventions Readmission Risk Prevention Plan 06/06/2020  Transportation Screening Complete  PCP or Specialist Appt within 3-5 Days Complete  HRI or [place] Complete  Social Work Consult for [place] Planning/Counseling Complete  Palliative Care Screening Not Applicable  Medication Review Press photographer) Complete  Some recent data might be hidden

## 2020-06-07 ENCOUNTER — Other Ambulatory Visit: Payer: Self-pay | Admitting: Nurse Practitioner

## 2020-06-07 LAB — CBC
HCT: 21.6 % — ABNORMAL LOW (ref 36.0–46.0)
Hemoglobin: 7.1 g/dL — ABNORMAL LOW (ref 12.0–15.0)
MCH: 35.9 pg — ABNORMAL HIGH (ref 26.0–34.0)
MCHC: 32.9 g/dL (ref 30.0–36.0)
MCV: 109.1 fL — ABNORMAL HIGH (ref 80.0–100.0)
Platelets: 64 10*3/uL — ABNORMAL LOW (ref 150–400)
RBC: 1.98 MIL/uL — ABNORMAL LOW (ref 3.87–5.11)
RDW: 26.2 % — ABNORMAL HIGH (ref 11.5–15.5)
WBC: 8.6 10*3/uL (ref 4.0–10.5)
nRBC: 0.2 % (ref 0.0–0.2)

## 2020-06-07 LAB — COMPREHENSIVE METABOLIC PANEL
ALT: 22 U/L (ref 0–44)
AST: 49 U/L — ABNORMAL HIGH (ref 15–41)
Albumin: 2 g/dL — ABNORMAL LOW (ref 3.5–5.0)
Alkaline Phosphatase: 263 U/L — ABNORMAL HIGH (ref 38–126)
Anion gap: 6 (ref 5–15)
BUN: 18 mg/dL (ref 8–23)
CO2: 26 mmol/L (ref 22–32)
Calcium: 7.4 mg/dL — ABNORMAL LOW (ref 8.9–10.3)
Chloride: 109 mmol/L (ref 98–111)
Creatinine, Ser: 0.43 mg/dL — ABNORMAL LOW (ref 0.44–1.00)
GFR, Estimated: >60 mL/min (ref >60)
Glucose, Bld: 105 mg/dL — ABNORMAL HIGH (ref 70–99)
Potassium: 3.7 mmol/L (ref 3.5–5.1)
Sodium: 141 mmol/L (ref 135–145)
Total Bilirubin: 2.2 mg/dL — ABNORMAL HIGH (ref 0.3–1.2)
Total Protein: 4.5 g/dL — ABNORMAL LOW (ref 6.5–8.1)

## 2020-06-07 LAB — GRAM STAIN

## 2020-06-07 LAB — GLUCOSE, CAPILLARY
Glucose-Capillary: 100 mg/dL — ABNORMAL HIGH (ref 70–99)
Glucose-Capillary: 96 mg/dL (ref 70–99)
Glucose-Capillary: 88 mg/dL (ref 70–99)
Glucose-Capillary: 125 mg/dL — ABNORMAL HIGH (ref 70–99)
Glucose-Capillary: 146 mg/dL — ABNORMAL HIGH (ref 70–99)
Glucose-Capillary: 97 mg/dL (ref 70–99)

## 2020-06-07 LAB — PATHOLOGIST SMEAR REVIEW

## 2020-06-07 MED ORDER — LOPERAMIDE HCL 2 MG PO CAPS
2.0000 mg | ORAL_CAPSULE | Freq: Two times a day (BID) | ORAL | Status: DC
  Administered 2020-06-07 – 2020-06-08 (×3): 2 mg via ORAL
  Filled 2020-06-07 (×3): qty 1

## 2020-06-07 MED ORDER — FUROSEMIDE 40 MG PO TABS
40.0000 mg | ORAL_TABLET | Freq: Every day | ORAL | Status: DC
  Administered 2020-06-08: 10:00:00 40 mg via ORAL
  Filled 2020-06-07: qty 1

## 2020-06-07 NOTE — Progress Notes (Signed)
Occupational Therapy Treatment Patient Details Name: Tamara Wood MRN: [id number] DOB: [birth year] Today's Date: 06/07/2020    History of present illness Patient is a 74 year old  female with PMH of Pancreatic cancer, DM2, HTN, admitted with abdominal pain and increased fatigue. Patient diagnosed with severe sepsis, PNA and spontaneous bacterial peritonitis   OT comments  Treatment focused on promoting activity to improve strength and endurance and functional mobility. Patient max assist to transfer to side of bed. Patient sat edge of bed with back unsupported to kick legs, have her back washed and lotion applied and to talk to doctor. Patient mod x 2 to stand from elevated bed height and able to take steps to recliner with RW. Patient limited by weakness and rectal tube. Patient and family agreeable to SNF at this time due continued weakness and needing significant assistance for transfers and ADLs.    Follow Up Recommendations  SNF    Equipment Recommendations  None recommended by OT    Recommendations for Other Services      Precautions / Restrictions Precautions Precautions: Fall Precaution Comments: flexi seal Restrictions Weight Bearing Restrictions: No       Mobility Bed Mobility Overal bed mobility: Needs Assistance Bed Mobility: Supine to Sit Rolling: Max assist         General bed mobility comments: Max assist to transfer to side of bed - needing assistance guid legs, trunk lift off, pivoting of hips with bed pad and scooting to EOB  Transfers Overall transfer level: Needs assistance Equipment used: Rolling walker (2 wheeled) Transfers: Sit to/from Stand Sit to Stand: Mod assist;From elevated surface Stand pivot transfers: Min assist       General transfer comment: Mod x 2 to stand from very elevated bed - nearing high perch. Min assist to take steps to recliner with RW.    Balance Overall balance assessment: Needs assistance Sitting-balance support: No  upper extremity supported;Feet supported Sitting balance-Leahy Scale: Good     Standing balance support: Bilateral upper extremity supported;During functional activity Standing balance-Leahy Scale: Poor Standing balance comment: reliant on B UE support and external assistance                           ADL either performed or assessed with clinical judgement   ADL                                               Vision Patient Visual Report: No change from baseline Vision Assessment?: No apparent visual deficits   Perception     Praxis      Cognition Arousal/Alertness: Awake/alert Behavior During Therapy: WFL for tasks assessed/performed Overall Cognitive Status: Within Functional Limits for tasks assessed                                          Exercises     Shoulder Instructions       General Comments      Pertinent Vitals/ Pain       Pain Assessment: No/denies pain  Home Living  Prior Functioning/Environment              Frequency  Min 2X/week        Progress Toward Goals  OT Goals(current goals can now be found in the care plan section)  Progress towards OT goals: Progressing toward goals  Acute Rehab OT Goals Patient Stated Goal: to get stronger to go home OT Goal Formulation: With patient/family Time For Goal Achievement: 06/13/20 Potential to Achieve Goals: Good  Plan Discharge plan needs to be updated    Co-evaluation          OT goals addressed during session: ADL's and self-care;Strengthening/ROM (functional mobility)      AM-PAC OT "6 Clicks" Daily Activity     Outcome Measure   Help from another person eating meals?: None Help from another person taking care of personal grooming?: A Little Help from another person toileting, which includes using toliet, bedpan, or urinal?: Total Help from another person bathing  (including washing, rinsing, drying)?: A Lot Help from another person to put on and taking off regular upper body clothing?: A Little Help from another person to put on and taking off regular lower body clothing?: A Lot 6 Click Score: 15    End of Session Equipment Utilized During Treatment: Rolling walker;Gait belt  OT Visit Diagnosis: Unsteadiness on feet (R26.81);Other abnormalities of gait and mobility (R26.89);Muscle weakness (generalized) (M62.81)   Activity Tolerance Patient tolerated treatment well   Patient Left in chair;with call bell/phone within reach;with family/visitor present;with chair alarm set   Nurse Communication Mobility status ([place] status)        Time: 1448-1856 OT Time Calculation (min): 37 min  Charges: OT General Charges $OT Visit: 1 Visit OT Treatments $Therapeutic Activity: 23-37 mins  Kent Braunschweig, OTR/L [place]  Office [phone] Pager: [place] 06/07/2020, 12:51 PM

## 2020-06-07 NOTE — Progress Notes (Signed)
Patient ID: Tamara Wood, female   DOB: January 30, 1946, 74 y.o.   MRN: [id number]  PROGRESS NOTE    Tamara Wood  [id number] DOB: 12/19/1945 DOA: 05/29/2020 PCP: Isaac Bliss, Rayford Halsted, MD   Brief Narrative:  74 year old female with history of pancreatic cancer status post Whipple's procedure in March 2021 but could not complete secondary to metastatic disease subsequently underwent gastrojejunal bypass currently on palliative chemotherapy, diabetes type 2, hypertension, Jehovah's Witness who presented with weakness, abdominal pain.  She was hypotensive and tachycardic on presentation and was admitted for severe sepsis.  She was found to have pneumonia and SBP.  Assessment & Plan:   Severe sepsis: Present on admission SBP Possible community-acquired pneumonia Leukocytosis -Ascitic fluid analysis was suggestive of SBP.  Imaging showed left middle/lower lobe infiltrate suspicious for pneumonia -Currently hemodynamically stable.  Leukocytosis is resolved -She underwent paracentesis in the ED and peritoneal fluid showed WBCs of 9450.  She underwent repeat paracentesis on 06/02/2020 yielding 1 L of fluid with 4100 WBCs.  Cytology of fluid confirmed inflammatory cells no malignant cells.  Currently on Rocephin.  Status post repeat paracentesis by IR on 06/06/2020 and peritoneal fluid and only 248 WBCs.  GI following.  Acute hypoxic respiratory failure -Currently on 3 L oxygen via nasal cannula.  Wean off as able.  Advanced pancreatic cancer -Follows up with oncology/Dr. Burr Medico and is on palliative chemotherapy -CT imagings did not show progression of her malignancy, but did demonstrate hepatic steatosis. She is status post biliary stent -Pt noted to have elevated ALP and mild elevated liver enzymes along with total bilirubin. -Last chemo was on 12/2 -Oncology has evaluated the patient during his hospitalization.  Outpatient follow-up with oncology. -Consult palliative care for goals of care  discussion  Anasarca -Presented with abdominal distention, severe lower extremity edema, bilateral pleural effusion -Most likely from hypoalbuminemia -Increase Lasix to 40 mg daily.  Might need to add spironolactone at some point as well -Strict input and output.  Daily weights.  Diarrhea -Still has rectal tube.  C. difficile negative.  Imodium started today by GI.  Macrocytic anemia -Hemoglobin 7.1 today.  Patient cannot receive blood transfusion because of religious believes.  Patient received Aranesp on 05/31/2020 by oncology. -Repeat a.m. labs.  Thrombocytopenia -Secondary to recent chemo.  Platelets 64 today.  E. coli UTI -Currently on Rocephin  Severe protein calorie malnutrition Hypoalbuminemia -Follow nutrition recommendations  Hypoglycemia -Improving.  Continue to monitor CBGs  Generalized deconditioning -PT recommends SNF placement.  Social worker following. -Palliative care consultation for goals of care discussion    DVT prophylaxis: SCDs Code Status: Partial with no intubation Family Communication: Daughter at bedside.  Communicated with patient and daughter via video interpreter Disposition Plan: Status is: Inpatient  Remains inpatient appropriate because:Inpatient level of care appropriate due to severity of illness   Dispo: The patient is from: SNF              Anticipated d/c is to: SNF              Anticipated d/c date is: 1 day              Patient currently is not medically stable to d/c.   Consultants: Oncology/GI/IR  Procedures: Paracentesis x3  Antimicrobials:  Anti-infectives (From admission, onward)   Start     Dose/Rate Route Frequency Ordered Stop   05/30/20 1600  vancomycin (VANCOCIN) IVPB 1000 mg/200 mL premix  Status:  Discontinued        1,000 mg  200 mL/hr over 60 Minutes Intravenous Every 24 hours 05/29/20 2021 05/30/20 1058   05/30/20 1445  cefTRIAXone (ROCEPHIN) 2 g in sodium chloride 0.9 % 100 mL IVPB        2 g 200 mL/hr  over 30 Minutes Intravenous Every 24 hours 05/30/20 1354     05/29/20 2200  piperacillin-tazobactam (ZOSYN) IVPB 3.375 g  Status:  Discontinued        3.375 g 12.5 mL/hr over 240 Minutes Intravenous Every 8 hours 05/29/20 2026 05/30/20 1354   05/29/20 1600  vancomycin (VANCOREADY) IVPB 1250 mg/250 mL        1,250 mg 166.7 mL/hr over 90 Minutes Intravenous  Once 05/29/20 1551 05/29/20 2057   05/29/20 1545  piperacillin-tazobactam (ZOSYN) IVPB 3.375 g        3.375 g 100 mL/hr over 30 Minutes Intravenous  Once 05/29/20 1533 05/29/20 1635   05/29/20 1230  azithromycin (ZITHROMAX) 500 mg in sodium chloride 0.9 % 250 mL IVPB        500 mg 250 mL/hr over 60 Minutes Intravenous  Once 05/29/20 1228 05/29/20 1555   05/29/20 1015  cefTRIAXone (ROCEPHIN) 1 g in sodium chloride 0.9 % 100 mL IVPB  Status:  Discontinued        1 g 200 mL/hr over 30 Minutes Intravenous Every 24 hours 05/29/20 1014 05/29/20 2026       Subjective: Patient seen and examined at bedside.  Daughter present at bedside.  Communicated by doing interpreter.  Denies worsening abdominal pain, fever or vomiting.  No worsening shortness with.  Poor historian.  Objective: Vitals:   06/06/20 2050 06/07/20 0516 06/07/20 0755 06/07/20 1159  BP: 130/62 120/65 130/73 122/87  Pulse: 83 72 82 77  Resp: 16 16 19 18   Temp: 98.8 F (37.1 C) 98 F (36.7 C) 97.8 F (36.6 C) 98.1 F (36.7 C)  TempSrc: Oral Oral Oral Oral  SpO2: 99% 100% 93% 100%  Weight:      Height:        Intake/Output Summary (Last 24 hours) at 06/07/2020 1222 Last data filed at 06/07/2020 0636 Gross per 24 hour  Intake --  Output 1850 ml  Net -1850 ml   Filed Weights   05/29/20 0911 05/30/20 0127 06/02/20 1900  Weight: 56.2 kg 67.9 kg 73.3 kg    Examination:  General exam: Appears calm and comfortable.  Looks chronically ill.  Elderly female sitting on the edge of the bed.  Currently on 3 L oxygen via nasal cannula Respiratory system: Bilateral  decreased breath sounds at bases with scattered crackles Cardiovascular system: S1 & S2 heard, Rate controlled Gastrointestinal system: Abdomen is slightly distended, soft and nontender. Normal bowel sounds heard. Extremities: No cyanosis, clubbing; lower extremity edema present Central nervous system: Alert and oriented. No focal neurological deficits. Moving extremities Skin: No rashes, lesions or ulcers Psychiatry: Flat affect.  Poor historian. Rectal tube present    Data Reviewed: I have personally reviewed following labs and imaging studies  CBC: Recent Labs  Lab 06/02/20 0407 06/04/20 0606 06/05/20 0407 06/06/20 0511 06/07/20 0500  WBC 13.0* 11.4* 10.3 14.9* 8.6  NEUTROABS 11.4*  --   --   --   --   HGB 7.5* 7.6* 7.2* 8.3* 7.1*  HCT 21.9* 22.2* 21.0* 24.2* 21.6*  MCV 101.9* 102.3* 102.9* 105.7* 109.1*  PLT 85* 80* 76* 92* 64*   Basic Metabolic Panel: Recent Labs  Lab 06/01/20 1750 06/02/20 0407 06/03/20 0500 06/04/20 0606 06/05/20 0407 06/06/20 [id number]  06/07/20 0500  NA 141   < > 139 142 142 139 141  K 2.6*   < > 3.6 3.9 4.0 3.5 3.7  CL 108   < > 106 108 109 108 109  CO2 22   < > 25 26 27 25 26   GLUCOSE 149*   < > 116* 128* 117* 90 105*  BUN 18   < > 21 18 18 19 18   CREATININE 0.70   < > 0.57 0.46 0.33* 0.51 0.43*  CALCIUM 7.6*   < > 7.4* 7.7* 7.5* 7.8* 7.4*  MG 1.4*  --  1.9  --   --   --   --    < > = values in this interval not displayed.   GFR: Estimated Creatinine Clearance: 59.6 mL/min (A) (by C-G formula based on SCr of 0.43 mg/dL (L)). Liver Function Tests: Recent Labs  Lab 06/03/20 0500 06/04/20 0606 06/05/20 0407 06/06/20 0511 06/07/20 0500  AST 43* 59* 62* 59* 49*  ALT 17 20 21 23 22   ALKPHOS 235* 299* 272* 311* 263*  BILITOT 2.1* 2.6* 1.8* 2.1* 2.2*  PROT 4.5* 4.9* 4.5* 5.2* 4.5*  ALBUMIN 2.5* 2.4* 2.1* 2.3* 2.0*   No results for input(s): LIPASE, AMYLASE in the last 168 hours. No results for input(s): AMMONIA in the last 168  hours. Coagulation Profile: No results for input(s): INR, PROTIME in the last 168 hours. Cardiac Enzymes: No results for input(s): CKTOTAL, CKMB, CKMBINDEX, TROPONINI in the last 168 hours. BNP (last 3 results) No results for input(s): PROBNP in the last 8760 hours. HbA1C: No results for input(s): HGBA1C in the last 72 hours. CBG: Recent Labs  Lab 06/06/20 2053 06/07/20 0212 06/07/20 0518 06/07/20 0753 06/07/20 1202  GLUCAP 182* 100* 96 88 125*   Lipid Profile: No results for input(s): CHOL, HDL, LDLCALC, TRIG, CHOLHDL, LDLDIRECT in the last 72 hours. Thyroid Function Tests: No results for input(s): TSH, T4TOTAL, FREET4, T3FREE, THYROIDAB in the last 72 hours. Anemia Panel: No results for input(s): VITAMINB12, FOLATE, FERRITIN, TIBC, IRON, RETICCTPCT in the last 72 hours. Sepsis Labs: No results for input(s): PROCALCITON, LATICACIDVEN in the last 168 hours.  Recent Results (from the past 240 hour(s))  Blood Culture (routine x 2)     Status: None   Collection Time: 05/29/20 11:00 AM   Specimen: BLOOD  Result Value Ref Range Status   Specimen Description   Final    BLOOD LEFT ANTECUBITAL Performed at [facility] [address], [place], [place] [address]    Special Requests   Final    BOTTLES DRAWN AEROBIC AND ANAEROBIC Blood Culture results may not be optimal due to an inadequate volume of blood received in culture bottles Performed at [facility] [address], [place], [place] [address]    Culture   Final    NO GROWTH 5 DAYS Performed at [facility], [place] [address] [place], [place] [address]    Report Status 06/03/2020 FINAL  Final  Blood Culture (routine x 2)     Status: None   Collection Time: 05/29/20 11:12 AM   Specimen: BLOOD  Result Value Ref Range Status   Specimen Description   Final    BLOOD RIGHT ANTECUBITAL Performed at [facility] [address], [place], [place] [address]     Special Requests   Final    BOTTLES DRAWN AEROBIC AND ANAEROBIC Blood Culture adequate volume Performed at [facility], [address] [place], [place] [address]    Culture   Final    NO GROWTH 5 DAYS Performed at [facility], [place] [address], [place], [place] [address]    Report Status 06/03/2020 FINAL  Final  Resp Panel by RT-PCR (Flu A&B, Covid) Nasopharyngeal Swab     Status: None   Collection Time: 05/29/20 11:17 AM   Specimen: Nasopharyngeal Swab; Nasopharyngeal(NP) swabs in vial transport medium  Result Value Ref Range Status   SARS Coronavirus 2 by RT PCR NEGATIVE NEGATIVE Final    Comment: (NOTE) SARS-CoV-2 target nucleic acids are NOT DETECTED.  The SARS-CoV-2 RNA is generally detectable in upper respiratory specimens during the acute phase of infection. The lowest concentration of SARS-CoV-2 viral copies this assay can detect is 138 copies/mL. A negative result does not preclude SARS-Cov-2 infection and should not be used as the sole basis for treatment or other patient management decisions. A negative result may occur with  improper specimen collection/handling, submission of specimen other than nasopharyngeal swab, presence of viral mutation(s) within the areas targeted by this assay, and inadequate number of viral copies(<138 copies/mL). A negative result must be combined with clinical observations, patient history, and epidemiological information. The expected result is Negative.  Fact Sheet for Patients:  EntrepreneurPulse.com.au  Fact Sheet for Healthcare Providers:  IncredibleEmployment.be  This test is no t yet approved or cleared by the [place] FDA and  has been authorized for detection and/or diagnosis of SARS-CoV-2 by FDA under an Emergency Use Authorization (EUA). This EUA will remain  in effect (meaning this test can be used) for the duration of the COVID-19 declaration under Section  564(b)(1) of the Act, 21 U.S.C.section 360bbb-3(b)(1), unless the authorization is terminated  or revoked sooner.       Influenza A by PCR NEGATIVE NEGATIVE Final   Influenza B by PCR NEGATIVE NEGATIVE Final    Comment: (NOTE) The Xpert Xpress SARS-CoV-2/FLU/RSV plus assay is intended as an aid in the diagnosis of influenza from Nasopharyngeal swab specimens and should not be used as a sole basis for treatment. Nasal washings and aspirates are unacceptable for Xpert Xpress SARS-CoV-2/FLU/RSV testing.  Fact Sheet for Patients: EntrepreneurPulse.com.au  Fact Sheet for Healthcare Providers: IncredibleEmployment.be  This test is not yet approved or cleared by the [place] FDA and has been authorized for detection and/or diagnosis of SARS-CoV-2 by FDA under an Emergency Use Authorization (EUA). This EUA will remain in effect (meaning this test can be used) for the duration of the COVID-19 declaration under Section 564(b)(1) of the Act, 21 U.S.C. section 360bbb-3(b)(1), unless the authorization is terminated or revoked.  Performed at [facility], [place] [address], [place], [place] [address]   Urine culture     Status: None   Collection Time: 05/29/20 12:48 PM   Specimen: In/Out Cath Urine  Result Value Ref Range Status   Specimen Description   Final    IN/OUT CATH URINE Performed at [facility] [address], [place], [place] [address]    Special Requests   Final    NONE Performed at [facility], [place] [address], [place], [place] [address]    Culture   Final    NO GROWTH Performed at [facility], [place] [address] [place], [place] [address]    Report Status 05/30/2020 FINAL  Final  Body fluid culture (includes gram stain)     Status: None   Collection Time: 05/29/20  4:34  PM   Specimen: Peritoneal Washings; Peritoneal Fluid  Result Value Ref Range Status   Specimen  Description   Final    PERITONEAL FLUID Performed at [facility], [place] [address], [place], [place] [address]    Special Requests   Final    NONE Performed at [facility], [place] [address], [place], [place] [address]    Gram Stain   Final    FEW WBC PRESENT, PREDOMINANTLY MONONUCLEAR NO ORGANISMS SEEN    Culture   Final    NO GROWTH 3 DAYS Performed at [facility] [address], [place], [place] [address]    Report Status 06/02/2020 FINAL  Final  MRSA PCR Screening     Status: None   Collection Time: 05/30/20  1:26 AM   Specimen: Nasal Mucosa; Nasopharyngeal  Result Value Ref Range Status   MRSA by PCR NEGATIVE NEGATIVE Final    Comment:        The GeneXpert MRSA Assay (FDA approved for NASAL specimens only), is one component of a comprehensive MRSA colonization surveillance program. It is not intended to diagnose MRSA infection nor to guide or monitor treatment for MRSA infections. Performed at [facility], [place] [address], [place], [place] [address]   Culture, body fluid-bottle     Status: None (Preliminary result)   Collection Time: 06/02/20  6:42 PM   Specimen: Peritoneal Washings  Result Value Ref Range Status   Specimen Description PERITONEAL  Final   Special Requests NONE  Final   Culture   Final    NO GROWTH 3 DAYS Performed at [facility], [address], [place], [place] [address]    Report Status PENDING  Incomplete  Gram stain     Status: None   Collection Time: 06/02/20  6:42 PM   Specimen: Peritoneal Washings  Result Value Ref Range Status   Specimen Description PERITONEAL  Final   Special Requests NONE  Final   Gram Stain   Final    FEW WBC PRESENT, PREDOMINANTLY MONONUCLEAR NO ORGANISMS SEEN Performed at [facility], [place] [address], [place], [place] [address]    Report Status 06/04/2020 FINAL  Final  C Difficile Quick Screen (NO PCR Reflex)     Status: None   Collection Time:  06/05/20  1:33 PM   Specimen: STOOL  Result Value Ref Range Status   C Diff antigen NEGATIVE NEGATIVE Final   C Diff toxin NEGATIVE NEGATIVE Final   C Diff interpretation No C. difficile detected.  Final    Comment: Performed at [facility], [place] [address] [place], [place] [address]  Gram stain     Status: None   Collection Time: 06/06/20  3:35 PM   Specimen: Peritoneal Washings  Result Value Ref Range Status   Specimen Description PERITONEAL  Final   Special Requests NONE  Final   Gram Stain   Final    WBC PRESENT,BOTH PMN AND MONONUCLEAR NO ORGANISMS SEEN CYTOSPIN SMEAR Performed at [facility], [address], [place],  [address]    Report Status 06/07/2020 FINAL  Final         Radiology Studies: US Paracentesis  Result Date: 06/06/2020 INDICATION: Patient with history of pancreatic cancer, spontaneous bacterial peritonitis, sepsis, recurrent ascites. Request made for diagnostic paracentesis. EXAM: ULTRASOUND GUIDED DIAGNOSTIC PARACENTESIS MEDICATIONS: 1% lidocaine to skin and subcutaneous tissue COMPLICATIONS: None immediate. PROCEDURE: Informed written consent was obtained from the  patient after a discussion of the risks, benefits and alternatives to treatment. A timeout was performed prior to the initiation of the procedure. Initial ultrasound scanning demonstrates a moderate amount of ascites within the left mid to lower abdominal quadrant. The left mid to lower abdomen was prepped and draped in the usual sterile fashion. 1% lidocaine was used for local anesthesia. Following this, a 19 gauge, 10-cm, Yueh catheter was introduced. An ultrasound image was saved for documentation purposes. The paracentesis was performed. The catheter was removed and a dressing was applied. The patient tolerated the procedure well without immediate post procedural complication. FINDINGS: A total of approximately 150 cc of clear, yellow fluid was removed. Samples were  sent to the laboratory as requested by the clinical team. IMPRESSION: Successful ultrasound-guided diagnostic paracentesis yielding 150 cc of peritoneal fluid. Read by: Rowe Robert, PA-C Electronically Signed   By: Markus Daft M.D.   On: 06/06/2020 15:39   ECHOCARDIOGRAM COMPLETE  Result Date: 06/06/2020    ECHOCARDIOGRAM REPORT   Patient Name:   Tamara Wood Date of Exam: 06/06/2020 Medical Rec #:  [id number]    Height:       64.0 in Accession #:    [id number]   Weight:       161.7 lb Date of Birth:  1946/04/05     BSA:          1.787 m Patient Age:    47 years     BP:           118/67 mmHg Patient Gender: F            HR:           79 bpm. Exam Location:  Inpatient Procedure: 2D Echo, Cardiac Doppler and Color Doppler Indications:    Study is to rule out right heart failure.; R06.02 SOB  History:        Patient has no prior history of Echocardiogram examinations.                 Abnormal ECG; Risk Factors:Diabetes and Hypertension. Pancreatic                 cancer. Ascites.  Sonographer:    Roseanna Rainbow RDCS Referring Phys: [id number] [place]  Sonographer Comments: Technically difficult study due to poor echo windows. IMPRESSIONS  1. Left ventricular ejection fraction, by estimation, is 65 to 70%. The left ventricle has normal function. The left ventricle has no regional wall motion abnormalities. There is mild left ventricular hypertrophy. Left ventricular diastolic parameters are consistent with Grade I diastolic dysfunction (impaired relaxation).  2. Right ventricular systolic function is normal. The right ventricular size is normal. Tricuspid regurgitation signal is inadequate for assessing PA pressure.  3. Moderate pleural effusion in the left lateral region.  4. The mitral valve is grossly normal. Trivial mitral valve regurgitation.  5. The aortic valve is tricuspid. Aortic valve regurgitation is not visualized. Comparison(s): No prior Echocardiogram. FINDINGS  Left Ventricle: Left ventricular  ejection fraction, by estimation, is 65 to 70%. The left ventricle has normal function. The left ventricle has no regional wall motion abnormalities. The left ventricular internal cavity size was normal in size. There is  mild left ventricular hypertrophy. Left ventricular diastolic parameters are consistent with Grade I diastolic dysfunction (impaired relaxation). Normal left ventricular filling pressure. Right Ventricle: The right ventricular size is normal. No increase in right ventricular wall thickness. Right ventricular systolic function is normal. Tricuspid regurgitation signal is inadequate  for assessing PA pressure. Left Atrium: Left atrial size was normal in size. Right Atrium: Right atrial size was normal in size. Pericardium: There is no evidence of pericardial effusion. Mitral Valve: The mitral valve is grossly normal. Trivial mitral valve regurgitation. Tricuspid Valve: The tricuspid valve is grossly normal. Tricuspid valve regurgitation is trivial. Aortic Valve: The aortic valve is tricuspid. Aortic valve regurgitation is not visualized. Pulmonic Valve: The pulmonic valve was normal in structure. Pulmonic valve regurgitation is not visualized. Aorta: The aortic root and ascending aorta are structurally normal, with no evidence of dilitation. IAS/Shunts: No atrial level shunt detected by color flow Doppler. Additional Comments: There is a moderate pleural effusion in the left lateral region. Mild ascites is present.  LEFT VENTRICLE PLAX 2D LVIDd:         3.70 cm     Diastology LVIDs:         2.30 cm     LV e' medial:    7.94 cm/s LV PW:         1.30 cm     LV E/e' medial:  7.8 LV IVS:        1.20 cm     LV e' lateral:   11.00 cm/s LVOT diam:     1.90 cm     LV E/e' lateral: 5.6 LV SV:         62 LV SV Index:   35 LVOT Area:     2.84 cm  LV Volumes (MOD) LV vol d, MOD A2C: 56.1 ml LV vol d, MOD A4C: 52.0 ml LV vol s, MOD A2C: 22.2 ml LV vol s, MOD A4C: 15.8 ml LV SV MOD A2C:     33.9 ml LV SV MOD A4C:      52.0 ml LV SV MOD BP:      35.7 ml RIGHT VENTRICLE TAPSE (M-mode): 1.5 cm LEFT ATRIUM             Index       RIGHT ATRIUM          Index LA diam:        2.50 cm 1.40 cm/m  RA Area:     7.37 cm LA Vol (A2C):   24.5 ml 13.71 ml/m RA Volume:   12.10 ml 6.77 ml/m LA Vol (A4C):   21.2 ml 11.86 ml/m LA Biplane Vol: 24.2 ml 13.54 ml/m  AORTIC VALVE LVOT Vmax:   113.00 cm/s LVOT Vmean:  73.700 cm/s LVOT VTI:    0.220 m  AORTA Ao Root diam: 3.40 cm Ao Asc diam:  3.30 cm MITRAL VALVE MV Area (PHT): 3.53 cm    SHUNTS MV Decel Time: 215 msec    Systemic VTI:  0.22 m MV E velocity: 62.15 cm/s  Systemic Diam: 1.90 cm MV A velocity: 89.50 cm/s MV E/A ratio:  0.69 Lyman Bishop MD Electronically signed by Lyman Bishop MD Signature Date/Time: 06/06/2020/4:32:16 PM    Final         Scheduled Meds: . (feeding supplement) PROSource Plus  30 mL Oral BID BM  . vitamin C  500 mg Oral Daily  . Chlorhexidine Gluconate Cloth  6 each Topical Q0600  . feeding supplement  1 Container Oral BID AC  . furosemide  20 mg Oral Daily  . hydrocortisone  1 application Rectal BID  . insulin aspart  0-9 Units Subcutaneous Q4H  . loperamide  2 mg Oral BID  . mouth rinse  15 mL Mouth Rinse BID  .  metoprolol tartrate  12.5 mg Oral BID  . multivitamin with minerals  1 tablet Oral Daily  . nutrition supplement (JUVEN)  1 packet Oral BID BM  . pantoprazole  20 mg Oral Daily  . sodium chloride flush  10-40 mL Intracatheter Q12H  . zinc sulfate  220 mg Oral Daily   Continuous Infusions: . cefTRIAXone (ROCEPHIN)  IV 2 g (06/06/20 1553)          Aline August, MD [facility] 06/07/2020, 12:22 PM

## 2020-06-07 NOTE — Progress Notes (Signed)
[facility] Progress Note  CC:  SBP  Subjective:  Feeling good.  Family member at bedside.  Translation done via speaker phone with Tamara Wood, DIL.  She is sitting up in the chair.  Would like to try to eat something.  No abdominal pain.  All questions answered.  Objective:  Vital signs in last 24 hours: Temp:  [97.8 F (36.6 C)-98.8 F (37.1 C)] 97.8 F (36.6 C) (12/22 0755) Pulse Rate:  [72-83] 82 (12/22 0755) Resp:  [16-20] 19 (12/22 0755) BP: (120-142)/(62-78) 130/73 (12/22 0755) SpO2:  [93 %-100 %] 93 % (12/22 0755) Last BM Date: 06/06/20 General:  Alert, chronically ill-appearing, in NAD Heart:  Regular rate and rhythm; no murmurs Pulm:  CTAB.  No W/R/R. Abdomen:  Soft, minimally distended.  BS present.  Non-tender. Extremities:  1+ pitting edema noted in B/L LEs Neurologic:  Alert and oriented;  grossly normal neurologically.  Intake/Output from previous day: 12/21 0701 - 12/22 0700 In: 10 [I.V.:10] Out: 1850 [Urine:250; [id number]  Lab Results: Recent Labs    06/05/20 0407 06/06/20 0511 06/07/20 0500  WBC 10.3 14.9* 8.6  HGB 7.2* 8.3* 7.1*  HCT 21.0* 24.2* 21.6*  PLT 76* 92* 64*   BMET Recent Labs    06/05/20 0407 06/06/20 0511 06/07/20 0500  NA 142 139 141  K 4.0 3.5 3.7  CL 109 108 109  CO2 27 25 26   GLUCOSE 117* 90 105*  BUN 18 19 18   CREATININE 0.33* 0.51 0.43*  CALCIUM 7.5* 7.8* 7.4*   LFT Recent Labs    06/07/20 0500  PROT 4.5*  ALBUMIN 2.0*  AST 49*  ALT 22  ALKPHOS 263*  BILITOT 2.2*   US Paracentesis  Result Date: 06/06/2020 INDICATION: Patient with history of pancreatic cancer, spontaneous bacterial peritonitis, sepsis, recurrent ascites. Request made for diagnostic paracentesis. EXAM: ULTRASOUND GUIDED DIAGNOSTIC PARACENTESIS MEDICATIONS: 1% lidocaine to skin and subcutaneous tissue COMPLICATIONS: None immediate. PROCEDURE: Informed written consent was obtained from the patient after a discussion of the risks,  benefits and alternatives to treatment. A timeout was performed prior to the initiation of the procedure. Initial ultrasound scanning demonstrates a moderate amount of ascites within the left mid to lower abdominal quadrant. The left mid to lower abdomen was prepped and draped in the usual sterile fashion. 1% lidocaine was used for local anesthesia. Following this, a 19 gauge, 10-cm, Yueh catheter was introduced. An ultrasound image was saved for documentation purposes. The paracentesis was performed. The catheter was removed and a dressing was applied. The patient tolerated the procedure well without immediate post procedural complication. FINDINGS: A total of approximately 150 cc of clear, yellow fluid was removed. Samples were sent to the laboratory as requested by the clinical team. IMPRESSION: Successful ultrasound-guided diagnostic paracentesis yielding 150 cc of peritoneal fluid. Read by: Rowe Robert, PA-C Electronically Signed   By: Markus Daft M.D.   On: 06/06/2020 15:39   ECHOCARDIOGRAM COMPLETE  Result Date: 06/06/2020    ECHOCARDIOGRAM REPORT   Patient Name:   Tamara Wood Date of Exam: 06/06/2020 Medical Rec #:  [id number]    Height:       64.0 in Accession #:    [id number]   Weight:       161.7 lb Date of Birth:  05/02/1946     BSA:          1.787 m Patient Age:    74 years     BP:  118/67 mmHg Patient Gender: F            HR:           79 bpm. Exam Location:  Inpatient Procedure: 2D Echo, Cardiac Doppler and Color Doppler Indications:    Study is to rule out right heart failure.; R06.02 SOB  History:        Patient has no prior history of Echocardiogram examinations.                 Abnormal ECG; Risk Factors:Diabetes and Hypertension. Pancreatic                 cancer. Ascites.  Sonographer:    Roseanna Rainbow RDCS Referring Phys: [id number] [place]  Sonographer Comments: Technically difficult study due to poor echo windows. IMPRESSIONS  1. Left ventricular ejection fraction, by  estimation, is 65 to 70%. The left ventricle has normal function. The left ventricle has no regional wall motion abnormalities. There is mild left ventricular hypertrophy. Left ventricular diastolic parameters are consistent with Grade I diastolic dysfunction (impaired relaxation).  2. Right ventricular systolic function is normal. The right ventricular size is normal. Tricuspid regurgitation signal is inadequate for assessing PA pressure.  3. Moderate pleural effusion in the left lateral region.  4. The mitral valve is grossly normal. Trivial mitral valve regurgitation.  5. The aortic valve is tricuspid. Aortic valve regurgitation is not visualized. Comparison(s): No prior Echocardiogram. FINDINGS  Left Ventricle: Left ventricular ejection fraction, by estimation, is 65 to 70%. The left ventricle has normal function. The left ventricle has no regional wall motion abnormalities. The left ventricular internal cavity size was normal in size. There is  mild left ventricular hypertrophy. Left ventricular diastolic parameters are consistent with Grade I diastolic dysfunction (impaired relaxation). Normal left ventricular filling pressure. Right Ventricle: The right ventricular size is normal. No increase in right ventricular wall thickness. Right ventricular systolic function is normal. Tricuspid regurgitation signal is inadequate for assessing PA pressure. Left Atrium: Left atrial size was normal in size. Right Atrium: Right atrial size was normal in size. Pericardium: There is no evidence of pericardial effusion. Mitral Valve: The mitral valve is grossly normal. Trivial mitral valve regurgitation. Tricuspid Valve: The tricuspid valve is grossly normal. Tricuspid valve regurgitation is trivial. Aortic Valve: The aortic valve is tricuspid. Aortic valve regurgitation is not visualized. Pulmonic Valve: The pulmonic valve was normal in structure. Pulmonic valve regurgitation is not visualized. Aorta: The aortic root and  ascending aorta are structurally normal, with no evidence of dilitation. IAS/Shunts: No atrial level shunt detected by color flow Doppler. Additional Comments: There is a moderate pleural effusion in the left lateral region. Mild ascites is present.  LEFT VENTRICLE PLAX 2D LVIDd:         3.70 cm     Diastology LVIDs:         2.30 cm     LV e' medial:    7.94 cm/s LV PW:         1.30 cm     LV E/e' medial:  7.8 LV IVS:        1.20 cm     LV e' lateral:   11.00 cm/s LVOT diam:     1.90 cm     LV E/e' lateral: 5.6 LV SV:         62 LV SV Index:   35 LVOT Area:     2.84 cm  LV Volumes (MOD) LV vol d, MOD  A2C: 56.1 ml LV vol d, MOD A4C: 52.0 ml LV vol s, MOD A2C: 22.2 ml LV vol s, MOD A4C: 15.8 ml LV SV MOD A2C:     33.9 ml LV SV MOD A4C:     52.0 ml LV SV MOD BP:      35.7 ml RIGHT VENTRICLE TAPSE (M-mode): 1.5 cm LEFT ATRIUM             Index       RIGHT ATRIUM          Index LA diam:        2.50 cm 1.40 cm/m  RA Area:     7.37 cm LA Vol (A2C):   24.5 ml 13.71 ml/m RA Volume:   12.10 ml 6.77 ml/m LA Vol (A4C):   21.2 ml 11.86 ml/m LA Biplane Vol: 24.2 ml 13.54 ml/m  AORTIC VALVE LVOT Vmax:   113.00 cm/s LVOT Vmean:  73.700 cm/s LVOT VTI:    0.220 m  AORTA Ao Root diam: 3.40 cm Ao Asc diam:  3.30 cm MITRAL VALVE MV Area (PHT): 3.53 cm    SHUNTS MV Decel Time: 215 msec    Systemic VTI:  0.22 m MV E velocity: 62.15 cm/s  Systemic Diam: 1.90 cm MV A velocity: 89.50 cm/s MV E/A ratio:  0.69 Lyman Bishop MD Electronically signed by Lyman Bishop MD Signature Date/Time: 06/06/2020/4:32:16 PM    Final    Assessment / Plan: 75. 74 year old female withunresectablepancreatic adenocarcinoma on palliative chemotherapy[place] invasion of the SMV, agastrojejunostomywas completed. [facility] andnew onsetascites.Elevated LFTs. Acute hepatitis panel negative.S/P paracentesis 12/13positive for SBPwith PMN count  8,410.Cytology without malignant cells.She received Albumin IV100gmon 12/14 and 12/15. On Zosyn then switched to Rocephin12/14.Repeat paracentesis 12/17, 1 L peritoneal fluid removed. 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 Urine protein negative. Albumin 2.1. Received additional dose of Albumin 25gm 12/20. Cytology only inflammatory cells, no malignancy.  LFTs overall about stable.  ECHO looks good.  Repeat diagnostic paracentesis 12/21 showed improvement in cell counts. -Has completed more than enough Rocephin 1gm IV for the SBP (started on 12/14 and most studies say 5 days). - ? liver doppler to further assess for portal HTN -Will need prophylaxis for SBP with bactrim one double strength tablet daily. -Should try to titrate her diuretics if able (currently only on lasix 20 mg daily).  2. Sepsis secondary to left lower lobe pneumonia and SBP. Query ascending cholangitis component as CTAP 12/13 identified a CBDstent (placedin Jan [facility] Feb [facility] [place] due toobstructive jaundicesecondary to herpancreatic mass).CTAP12/13 alsoshowed herpancreatic head mass to be slightly smaller when compared to prior image study.She remains afebrile.WBC count much improved today. -Oncology following -Supportive care, on abx.  3. 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 Patient is a Jehovah witness therefore she declines any blood products. Hg 7.1 grams today.Iron 50. Vitamin B12 greater than 7500. No overt signs of GI bleeding.  4. Diarrhea. Patient and family report she had diarrhea prior to hospital admission which has worsened over the past few days.  Cdiff negative.  ? Secondary to chemo.  Also does not have a gallbladder. -Added Imodium BID to see if that helps. -Will allow soft diet.   LOS: 9 days   Laban Emperor. Andres Escandon  06/07/2020, 10:30 AM

## 2020-06-07 NOTE — Progress Notes (Signed)
Occupational Therapy Treatment Patient Details Name: Tamara Wood MRN: [id number] DOB: 1946/02/20 Today's Date: 06/07/2020    History of present illness Patient is a 74 year old  female with PMH of Pancreatic cancer, DM2, HTN, admitted with abdominal pain and increased fatigue. Patient diagnosed with severe sepsis, PNA and spontaneous bacterial peritonitis   OT comments  Therapist requested to assist with transfer back to bed - as attempts for patient to stand with nursing had failed. Patient seated in recliner when therapist entered the room. Attempted x 2 stand with use of walker and patient unable to assist with lower extremities and unable to raise buttocks off of chair. Use of RW aborted and therapist performed squat pivot (x 3 scoots) to return patient to edge of bed. Patient total assist with +2 assistance. Significantly fatigued after sitting in chair and inability to assist with LEs to rise or lift. Left patient in care of nursing staff.   Follow Up Recommendations  SNF    Equipment Recommendations  None recommended by OT    Recommendations for Other Services      Precautions / Restrictions Precautions Precautions: Fall Precaution Comments: flexi seal Restrictions Weight Bearing Restrictions: No       Mobility Bed Mobility Overal bed mobility: Needs Assistance Bed Mobility: Sit to Sidelying Rolling: Max assist       Sit to sidelying: Total assist;+2 for safety/equipment;+2 for physical assistance General bed mobility comments: After return from recliner - patient total assist to return to supine  Transfers Overall transfer level: Needs assistance Equipment used: Rolling walker (2 wheeled) Transfers: Sit to/from Stand Sit to Stand: Total assist Stand pivot transfers: Min assist       General transfer comment: Attempted twice to stand from recliner height (with pillows) patient unable to give any effort from lower extremities and assistance of two could not get  patient's buttocks to clear chair. Patient required squat pivot (+2, total assist) to perform 3 scoots to side of bed.    Balance Overall balance assessment: Needs assistance Sitting-balance support: No upper extremity supported Sitting balance-Leahy Scale: Good     Standing balance support: Bilateral upper extremity supported;During functional activity Standing balance-Leahy Scale: Poor Standing balance comment: reliant on B UE support and external assistance                           ADL either performed or assessed with clinical judgement   ADL                                               Vision Patient Visual Report: No change from baseline Vision Assessment?: No apparent visual deficits   Perception     Praxis      Cognition Arousal/Alertness: Awake/alert Behavior During Therapy: WFL for tasks assessed/performed Overall Cognitive Status: Within Functional Limits for tasks assessed                                          Exercises Other Exercises Other Exercises: sitting EOB x 15 min to work on activity tolerance Other Exercises: Knee extension each leg x 10   Shoulder Instructions       General Comments      Pertinent Vitals/ Pain  Pain Assessment: No/denies pain  Home Living                                          Prior Functioning/Environment              Frequency  Min 2X/week        Progress Toward Goals  OT Goals(current goals can now be found in the care plan section)  Progress towards OT goals: OT to reassess next treatment  Acute Rehab OT Goals Patient Stated Goal: to get stronger to go home OT Goal Formulation: With patient/family Time For Goal Achievement: 06/13/20 Potential to Achieve Goals: Good  Plan Discharge plan remains appropriate    Co-evaluation          OT goals addressed during session:  (functional mobility)      AM-PAC OT "6 Clicks"  Daily Activity     Outcome Measure   Help from another person eating meals?: None Help from another person taking care of personal grooming?: A Little Help from another person toileting, which includes using toliet, bedpan, or urinal?: Total Help from another person bathing (including washing, rinsing, drying)?: A Lot Help from another person to put on and taking off regular upper body clothing?: A Lot Help from another person to put on and taking off regular lower body clothing?: Total 6 Click Score: 9    End of Session Equipment Utilized During Treatment: Rolling walker  OT Visit Diagnosis: Unsteadiness on feet (R26.81);Other abnormalities of gait and mobility (R26.89);Muscle weakness (generalized) (M62.81)   Activity Tolerance Patient limited by fatigue   Patient Left in bed;with call bell/phone within reach;with nursing/sitter in room   Nurse Communication Mobility status;Need for lift equipment        Time: [phone] OT Time Calculation (min): 14 min  Charges: OT General Charges $OT Visit: 1 Visit OT Treatments $Therapeutic Activity: 8-22 mins  Derl Barrow, OTR/L [place]  Office [phone] Pager: [place] 06/07/2020, 3:48 PM

## 2020-06-08 DIAGNOSIS — Z66 Do not resuscitate: Secondary | ICD-10-CM

## 2020-06-08 DIAGNOSIS — Z515 Encounter for palliative care: Secondary | ICD-10-CM

## 2020-06-08 DIAGNOSIS — G893 Neoplasm related pain (acute) (chronic): Secondary | ICD-10-CM

## 2020-06-08 LAB — CULTURE, BODY FLUID W GRAM STAIN -BOTTLE: Culture: NO GROWTH

## 2020-06-08 LAB — COMPREHENSIVE METABOLIC PANEL
ALT: 23 U/L (ref 0–44)
AST: 48 U/L — ABNORMAL HIGH (ref 15–41)
Albumin: 2 g/dL — ABNORMAL LOW (ref 3.5–5.0)
Alkaline Phosphatase: 252 U/L — ABNORMAL HIGH (ref 38–126)
Anion gap: 8 (ref 5–15)
BUN: 20 mg/dL (ref 8–23)
CO2: 25 mmol/L (ref 22–32)
Calcium: 7.5 mg/dL — ABNORMAL LOW (ref 8.9–10.3)
Chloride: 107 mmol/L (ref 98–111)
Creatinine, Ser: 0.64 mg/dL (ref 0.44–1.00)
GFR, Estimated: >60 mL/min (ref >60)
Glucose, Bld: 126 mg/dL — ABNORMAL HIGH (ref 70–99)
Potassium: 4 mmol/L (ref 3.5–5.1)
Sodium: 140 mmol/L (ref 135–145)
Total Bilirubin: 2.4 mg/dL — ABNORMAL HIGH (ref 0.3–1.2)
Total Protein: 4.8 g/dL — ABNORMAL LOW (ref 6.5–8.1)

## 2020-06-08 LAB — CBC WITH DIFFERENTIAL/PLATELET
Abs Immature Granulocytes: 0.05 10*3/uL (ref 0.00–0.07)
Basophils Absolute: 0.1 10*3/uL (ref 0.0–0.1)
Basophils Relative: 1 %
Eosinophils Absolute: 0.1 10*3/uL (ref 0.0–0.5)
Eosinophils Relative: 1 %
HCT: 23.7 % — ABNORMAL LOW (ref 36.0–46.0)
Hemoglobin: 7.8 g/dL — ABNORMAL LOW (ref 12.0–15.0)
Immature Granulocytes: 1 %
Lymphocytes Relative: 8 %
Lymphs Abs: 0.7 10*3/uL (ref 0.7–4.0)
MCH: 36.6 pg — ABNORMAL HIGH (ref 26.0–34.0)
MCHC: 32.9 g/dL (ref 30.0–36.0)
MCV: 111.3 fL — ABNORMAL HIGH (ref 80.0–100.0)
Monocytes Absolute: 0.7 10*3/uL (ref 0.1–1.0)
Monocytes Relative: 8 %
Neutro Abs: 7.3 10*3/uL (ref 1.7–7.7)
Neutrophils Relative %: 81 %
Platelets: 64 10*3/uL — ABNORMAL LOW (ref 150–400)
RBC: 2.13 MIL/uL — ABNORMAL LOW (ref 3.87–5.11)
RDW: 26.2 % — ABNORMAL HIGH (ref 11.5–15.5)
WBC: 9 10*3/uL (ref 4.0–10.5)
nRBC: 0 % (ref 0.0–0.2)

## 2020-06-08 LAB — GLUCOSE, CAPILLARY
Glucose-Capillary: 104 mg/dL — ABNORMAL HIGH (ref 70–99)
Glucose-Capillary: 108 mg/dL — ABNORMAL HIGH (ref 70–99)
Glucose-Capillary: 150 mg/dL — ABNORMAL HIGH (ref 70–99)
Glucose-Capillary: 93 mg/dL (ref 70–99)

## 2020-06-08 LAB — SARS CORONAVIRUS 2 BY RT PCR (HOSPITAL ORDER, PERFORMED IN ~~LOC~~ HOSPITAL LAB): SARS Coronavirus 2: NEGATIVE

## 2020-06-08 LAB — MAGNESIUM: Magnesium: 1.4 mg/dL — ABNORMAL LOW (ref 1.7–2.4)

## 2020-06-08 MED ORDER — METFORMIN HCL 1000 MG PO TABS: 1000.0000 mg | ORAL_TABLET | Freq: Every day | ORAL | Status: AC

## 2020-06-08 MED ORDER — HEPARIN SOD (PORK) LOCK FLUSH 100 UNIT/ML IV SOLN
500.0000 [IU] | Freq: Once | INTRAVENOUS | Status: DC
  Filled 2020-06-08: qty 5

## 2020-06-08 MED ORDER — METOPROLOL TARTRATE 25 MG PO TABS: 12.5000 mg | ORAL_TABLET | Freq: Two times a day (BID) | ORAL | 0 refills | Status: DC

## 2020-06-08 MED ORDER — LOPERAMIDE HCL 2 MG PO CAPS
2.0000 mg | ORAL_CAPSULE | Freq: Two times a day (BID) | ORAL | 0 refills | Status: AC
Start: 2020-06-08 — End: ?

## 2020-06-08 MED ORDER — LIDOCAINE-PRILOCAINE 2.5-2.5 % EX CREA: 1.0000 "application " | TOPICAL_CREAM | Freq: Every day | CUTANEOUS | Status: AC | PRN

## 2020-06-08 MED ORDER — MAGNESIUM SULFATE 2 GM/50ML IV SOLN
2.0000 g | Freq: Once | INTRAVENOUS | Status: AC
  Administered 2020-06-08: 08:00:00 2 g via INTRAVENOUS
  Filled 2020-06-08: qty 50

## 2020-06-08 MED ORDER — FUROSEMIDE 40 MG PO TABS: 40.0000 mg | ORAL_TABLET | Freq: Every day | ORAL | 0 refills | Status: DC

## 2020-06-08 MED ORDER — HYDROCORTISONE (PERIANAL) 2.5 % EX CREA: 1.0000 "application " | TOPICAL_CREAM | Freq: Two times a day (BID) | CUTANEOUS | Status: AC | PRN

## 2020-06-08 MED ORDER — SULFAMETHOXAZOLE-TRIMETHOPRIM 800-160 MG PO TABS: 1.0000 | ORAL_TABLET | Freq: Every day | ORAL | 0 refills | Status: DC

## 2020-06-08 NOTE — Progress Notes (Signed)
Physical Therapy Treatment Patient Details Name: Tamara Wood MRN: [id number] DOB: [birth year] Today's Date: 06/08/2020    History of Present Illness Patient is a 74 year old  female with PMH of Pancreatic cancer, DM2, HTN, admitted with abdominal pain and increased fatigue. Patient diagnosed with severe sepsis, PNA and spontaneous bacterial peritonitis    PT Comments    Pt in bed with family at bedside to interpret.  General bed mobility comments: Max Assist to transition from supine to EOB using bed pad.  Once EOB and upright, pt able to static sit with Supervision level > 5 min  Assisted OOB to [facility].  General transfer comment: attempted sit to stand however pt unable to clear ghips off bed.  Assisted from elevated bed to [facility] via "[place]".  Pt very weak briefly able to support self and required increased assist to complete 1/4 pivot turn.  Assisted off [facility] same fashion with second assist performing peri care.  Very weak. General Gait Details: unable to attempt due to poor transfer ability Pt plans to D/C to SNF for ST Rehab.   Follow Up Recommendations  SNF     Equipment Recommendations  None recommended by PT    Recommendations for Other Services       Precautions / Restrictions Precautions Precaution Comments: none    Mobility  Bed Mobility Overal bed mobility: Needs Assistance Bed Mobility: Supine to Sit     Supine to sit: Mod assist;HOB elevated;Max assist     General bed mobility comments: Max Assist to transition from supine to EOB using bed pad.  Once EOB and upright, pt able to static sit with Supervision level > 5 min  Transfers Overall transfer level: Needs assistance Equipment used: None Transfers: Stand Pivot Transfers           General transfer comment: attempted sit to stand however pt unable to clear ghips off bed.  [facility] to [facility] via "[place]".  Pt very weak briefly able to support self and required increased assist to complete 1/4  pivot turn.  Assisted off [facility] same fashion with second assist performing peri care.  Very weak.  Ambulation/Gait             General Gait Details: unable to attempt due to poor transfer ability   Stairs             Wheelchair Mobility    Modified Rankin (Stroke Patients Only)       Balance                                            Cognition Arousal/Alertness: Awake/alert Behavior During Therapy: WFL for tasks assessed/performed Overall Cognitive Status: Within Functional Limits for tasks assessed                                 General Comments: Non English speaking/family member  in room to interpret      Exercises      General Comments        Pertinent Vitals/Pain Pain Assessment: No/denies pain    Home Living                      Prior Function            PT Goals (  current goals can now be found in the care plan section) Progress towards PT goals: Progressing toward goals    Frequency    Min 3X/week      PT Plan Current plan remains appropriate    Co-evaluation              AM-PAC PT "6 Clicks" Mobility   Outcome Measure  Help needed turning from your back to your side while in a flat bed without using bedrails?: Total Help needed moving from lying on your back to sitting on the side of a flat bed without using bedrails?: Total Help needed moving to and from a bed to a chair (including a wheelchair)?: Total Help needed standing up from a chair using your arms (e.g., wheelchair or bedside chair)?: Total Help needed to walk in hospital room?: Total Help needed climbing 3-5 steps with a railing? : Total 6 Click Score: 6    End of Session Equipment Utilized During Treatment: Oxygen Activity Tolerance: Patient limited by fatigue Patient left: in chair;with call bell/phone within reach;with nursing/sitter in room Nurse Communication: Mobility status;Need for lift equipment PT Visit  Diagnosis: Unsteadiness on feet (R26.81);Muscle weakness (generalized) (M62.81);Difficulty in walking, not elsewhere classified (R26.2)     Time: [id number] PT Time Calculation (min) (ACUTE ONLY): 32 min  Charges:  $Therapeutic Activity: 23-37 mins                     Rica Koyanagi  PTA Acute  Rehabilitation Services Pager      [phone] Office      [phone]

## 2020-06-08 NOTE — Discharge Summary (Signed)
Physician Discharge Summary  Tamara Wood [id number] DOB: 1946/01/14 DOA: 05/29/2020  PCP: Isaac Bliss, Rayford Halsted, MD  Admit date: 05/29/2020 Discharge date: 06/08/2020  Admitted From: Home Disposition: SNF  Recommendations for Outpatient Follow-up:  1. Follow up with SNF provider at earliest convenience with repeat CBC/BMP in the next few days 2. Outpatient follow-up with oncology/GI and palliative care.  If condition worsens, recommend hospice/comfort measures  3. follow up in ED if symptoms worsen or new appear   Home Health: No Equipment/Devices: None  Discharge Condition: Guarded to poor CODE STATUS: DNR Diet recommendation: Heart healthy/carb modified/fluid restriction of up to 1200 cc a day  Brief/Interim Summary: 74 year old female with history of pancreatic cancer status post Whipple's procedure in March 2021 but could not complete secondary to metastatic disease subsequently underwent gastrojejunal bypass currently on palliative chemotherapy, diabetes type 2, hypertension, Jehovah's Witness who presented with weakness, abdominal pain.  She was hypotensive and tachycardic on presentation and was admitted for severe sepsis.  She was found to have pneumonia and SBP.  During the hospitalization, his condition has gradually improved although she still remains very deconditioned.  PT has recommended SNF placement.  Her latest paracentesis showed improvement of SBP.  She is currently hemodynamically stable.  She will be discharged to SNF once bed is available with outpatient follow-up with oncology/GI/palliative care.  Overall prognosis is very poor.  Discharge Diagnoses:   Severe sepsis: Present on admission SBP Possible community-acquired pneumonia Leukocytosis -Ascitic fluid analysis was suggestive of SBP.  Imaging showed left middle/lower lobe infiltrate suspicious for pneumonia -Currently hemodynamically stable.  Leukocytosis is resolved -She underwent paracentesis in  the ED and peritoneal fluid showed WBCs of 9450.  She underwent repeat paracentesis on 06/02/2020 yielding 1 L of fluid with 4100 WBCs.  Cytology of fluid confirmed inflammatory cells no malignant cells.  Currently on Rocephin; will not need any more IV antibiotics. Status post repeat paracentesis by IR on 06/06/2020 and peritoneal fluid and only 248 WBCs.   -Sepsis has resolved. -GI recommends Bactrim double strength 1 tab daily for prophylaxis and GI signed off on 06/07/2020.  Outpatient follow-up with GI.  Acute hypoxic respiratory failure -Currently on 3 L oxygen via nasal cannula.  Wean off as able.  Advanced pancreatic cancer -Follows up with oncology/Dr. Burr Medico and is on palliative chemotherapy -CT imagings did not show progression of her malignancy,but did demonstratehepatic steatosis. She is status post biliary stent -Pt noted to haveelevated ALP and mild elevated liver enzymes along with total bilirubin. -Last chemo was on 12/2 -Oncology has evaluated the patient during his hospitalization.  Outpatient follow-up with oncology. -Palliative care evaluating the patient today and CODE STATUS has been changed to DNR by palliative care team.  Outpatient follow-up with palliative care.  Anasarca -Presented with abdominal distention, severe lower extremity edema, bilateral pleural effusion -Most likely from hypoalbuminemia -Increased Lasix to 40 mg daily.  Might need to add spironolactone at some point as well -Continue fluid restriction.  Diarrhea -Patient was started on Imodium by GI on 1221.  DC rectal tube.  C. difficile testing negative.  Macrocytic anemia -Hemoglobin 7.8 today.  Patient cannot receive blood transfusion because of religious believes.  Patient received Aranesp on 05/31/2020 by oncology. -Outpatient follow-up.  Thrombocytopenia -Secondary to recent chemo.  Platelets 64 today.  Outpatient follow-up.  E. coli UTI -Treated with Rocephin.  Severe protein  calorie malnutrition Hypoalbuminemia -Follow nutrition recommendations  Hypoglycemia -Improving.    Generalized deconditioning -PT recommends SNF placement.   Discharge  Instructions  Discharge Instructions    Amb Referral to Palliative Care   Complete by: As directed    Goals of care   Ambulatory referral to Gastroenterology   Complete by: As directed    followup   Ambulatory referral to Hematology / Oncology   Complete by: As directed    Diet - low sodium heart healthy   Complete by: As directed    Fluid restriction of upto 1236ml per day   Discharge wound care:   Complete by: As directed    Local wound care   Increase activity slowly   Complete by: As directed      Allergies as of 06/08/2020      Reactions   Shellfish Allergy Other (See Comments)   Upset stomach      Medication List    STOP taking these medications   amLODipine 5 MG tablet Commonly known as: NORVASC   Aspirin Low Dose 81 MG EC tablet Generic drug: aspirin   ciprofloxacin 500 MG tablet Commonly known as: Cipro   HYDROcodone-acetaminophen 5-325 MG tablet Commonly known as: NORCO/VICODIN   NyQuil HBP Cold & Flu 15-6.25-325 MG/15ML Liqd Generic drug: DM-Doxylamine-Acetaminophen   traMADol 50 MG tablet Commonly known as: ULTRAM     TAKE these medications   acetaminophen 500 MG tablet Commonly known as: TYLENOL Take 500-1,000 mg by mouth daily as needed for moderate pain.   Calcium/Vitamin D 500-200 MG-UNIT Tabs Take 1 tablet by mouth in the morning and at bedtime.   furosemide 40 MG tablet Commonly known as: LASIX Take 1 tablet (40 mg total) by mouth daily. What changed:   medication strength  how much to take  how to take this  when to take this  additional instructions   glucose blood test strip 1 each by Other route in the morning and at bedtime. Dx E11.9  For OneTouch Ultra 2   hydrocortisone 2.5 % rectal cream Commonly known as: Anusol-HC Place 1 application  rectally 2 (two) times daily as needed for anal itching.   Klor-Con M20 20 MEQ tablet Generic drug: potassium chloride SA TAKE 1 TABLET BY MOUTH EVERY DAY What changed: how much to take   lidocaine-prilocaine cream Commonly known as: EMLA Apply 1 application topically daily as needed (pain).   loperamide 2 MG capsule Commonly known as: IMODIUM Take 1 capsule (2 mg total) by mouth 2 (two) times daily.   Magnesium 250 MG Tabs Take 1 tablet (250 mg total) by mouth daily.   metFORMIN 1000 MG tablet Commonly known as: GLUCOPHAGE Take 1 tablet (1,000 mg total) by mouth daily.   metoprolol tartrate 25 MG tablet Commonly known as: LOPRESSOR Take 0.5 tablets (12.5 mg total) by mouth 2 (two) times daily. What changed:   medication strength  how much to take   ondansetron 8 MG tablet Commonly known as: ZOFRAN TAKE 1 TABLET (8 MG TOTAL) BY MOUTH 2 (TWO) TIMES DAILY AS NEEDED (NAUSEA OR VOMITING). What changed: reasons to take this   OneTouch Delica Lancets 99991111 Misc Use twice daily for glucose control, Dx E11.9   pantoprazole 20 MG tablet Commonly known as: PROTONIX TAKE 1 TABLET BY MOUTH EVERY DAY   prochlorperazine 10 MG tablet Commonly known as: COMPAZINE Take 1 tablet (10 mg total) by mouth every 6 (six) hours as needed (Nausea or vomiting). What changed: reasons to take this   sulfamethoxazole-trimethoprim 800-160 MG tablet Commonly known as: BACTRIM DS Take 1 tablet by mouth daily.   vitamin B-12  1000 MCG tablet Commonly known as: CYANOCOBALAMIN Take 1 tablet (1,000 mcg total) by mouth daily.            Discharge Care Instructions  (From admission, onward)         Start     Ordered   06/08/20 0000  Discharge wound care:       Comments: Local wound care   06/08/20 [id number]          Follow-up Information    Isaac Bliss, Rayford Halsted, MD. Schedule an appointment as soon as possible for a visit in 1 week(s).   Specialty: Internal Medicine Why: with  cbc/bmp Contact information: [address] [place] [address] [phone]              Allergies  Allergen Reactions  . Shellfish Allergy Other (See Comments)    Upset stomach    Consultations: Oncology/GI/IR/palliative care   Procedures/Studies: CT Angio Chest PE W and/or Wo Contrast  Result Date: 05/29/2020 CLINICAL DATA:  Syncopal episode, acute abdominal pain, weakness, history of pancreatic cancer on chemotherapy EXAM: CT ANGIOGRAPHY CHEST CT ABDOMEN AND PELVIS WITH CONTRAST TECHNIQUE: Multidetector CT imaging of the chest was performed using the standard protocol during bolus administration of intravenous contrast. Multiplanar CT image reconstructions and MIPs were obtained to evaluate the vascular anatomy. Multidetector CT imaging of the abdomen and pelvis was performed using the standard protocol during bolus administration of intravenous contrast. CONTRAST:  134mL OMNIPAQUE IOHEXOL 350 MG/ML SOLN IV. No oral contrast. COMPARISON:  CT abdomen and pelvis 03/06/2020, CT chest 11/29/2019 FINDINGS: CTA CHEST FINDINGS Cardiovascular: Atherosclerotic calcifications aorta and coronary arteries. RIGHT jugular Port-A-Cath with tip in RIGHT atrium. Aorta normal caliber without aneurysm or dissection. Heart normal size. No pericardial effusion. Pulmonary arteries adequately opacified and grossly patent. No evidence of pulmonary embolism. Mediastinum/Nodes: Soft is unremarkable. Base of cervical region normal appearance. No thoracic adenopathy. Lungs/Pleura: Moderate pleural effusions bilaterally. Compressive atelectasis of BILATERAL lower lobes and posterior upper lobes minimal infiltrate in upper lobes. No pneumothorax. No definite pulmonary mass/nodule. Musculoskeletal: Diffuse osseous demineralization. No discrete bone lesion. Review of the MIP images confirms the above findings. CT ABDOMEN and PELVIS FINDINGS Hepatobiliary: Marked hepatic steatosis. Gallbladder surgically  absent. CBD stent. No hepatic mass or biliary dilatation. Small nodule anterior to the LEFT lobe of the liver on the previous exam is poorly demonstrated on the current study, subjectively smaller but difficult to accurately measure due to ascites. Pancreas: Mass at pancreatic head/uncinate 4.0 x 4.1 cm, appears slightly smaller. Mild pancreatic ductal dilatation at body and proximal tail. No additional mass. 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 Adrenal glands otherwise unremarkable. Small BILATERAL renal cysts. No hydronephrosis, hydroureter, or urinary tract calcification. Bladder decompressed. Stomach/Bowel: Scattered bowel wall thickening, mild in degree, may be related to ascites. Diverticulosis of descending and sigmoid colon without definite wall thickening to suggest diverticulitis. Appendix surgically absent by history. Prior gastric bypass surgery. Vascular/Lymphatic: Extensive atherosclerotic calcifications aorta and iliac arteries without aneurysm. No definite adenopathy. Reproductive: Atrophic uterus with unremarkable ovaries Other: Significant ascites. No definite peritoneal based mass. Scattered subcutaneous edema. No free air. No hernia. Musculoskeletal: Osseous demineralization. Review of the MIP images confirms the above findings. IMPRESSION: No evidence of pulmonary embolism. BILATERAL pleural effusions with compressive atelectasis of the lower lobes and posterior upper lobes. Slight decrease in size of pancreatic head mass. Marked hepatic steatosis. Slight decrease in size of nodule at  the anterior surface LEFT lobe liver. CBD stent without biliary dilatation. Scattered bowel wall thickening, may be related to ascites, unable to exclude enteritis. Distal colonic diverticulosis without evidence of diverticulitis. LEFT adrenal myelolipoma. Aortic Atherosclerosis (ICD10-I70.0). Electronically Signed   By: Lavonia Dana M.D.   On: 05/29/2020 15:00   CT ABDOMEN PELVIS W CONTRAST  Result Date: 05/29/2020 CLINICAL DATA:  Syncopal episode, acute abdominal pain, weakness, history of pancreatic cancer on chemotherapy EXAM: CT ANGIOGRAPHY CHEST CT ABDOMEN AND PELVIS WITH CONTRAST TECHNIQUE: Multidetector CT imaging of the chest was performed using the standard protocol during bolus administration of intravenous contrast. Multiplanar CT image reconstructions and MIPs were obtained to evaluate the vascular anatomy. Multidetector CT imaging of the abdomen and pelvis was performed using the standard protocol during bolus administration of intravenous contrast. CONTRAST:  127mL OMNIPAQUE IOHEXOL 350 MG/ML SOLN IV. No oral contrast. COMPARISON:  CT abdomen and pelvis 03/06/2020, CT chest 11/29/2019 FINDINGS: CTA CHEST FINDINGS Cardiovascular: Atherosclerotic calcifications aorta and coronary arteries. RIGHT jugular Port-A-Cath with tip in RIGHT atrium. Aorta normal caliber without aneurysm or dissection. Heart normal size. No pericardial effusion. Pulmonary arteries adequately opacified and grossly patent. No evidence of pulmonary embolism. Mediastinum/Nodes: Soft is unremarkable. Base of cervical region normal appearance. No thoracic adenopathy. Lungs/Pleura: Moderate pleural effusions bilaterally. Compressive atelectasis of BILATERAL lower lobes and posterior upper lobes minimal infiltrate in upper lobes. No pneumothorax. No definite pulmonary mass/nodule. Musculoskeletal: Diffuse osseous demineralization. No discrete bone lesion. Review of the MIP images confirms the above findings. CT ABDOMEN and PELVIS FINDINGS Hepatobiliary: Marked hepatic steatosis. Gallbladder surgically absent. CBD stent. No hepatic mass or biliary dilatation. Small nodule anterior to the LEFT lobe of the liver on the previous exam is poorly demonstrated on the current study, subjectively smaller but difficult to accurately measure due to ascites. Pancreas:  Mass at pancreatic head/uncinate 4.0 x 4.1 cm, appears slightly smaller. Mild pancreatic ductal dilatation at body and proximal tail. No additional mass. 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 Adrenal glands otherwise unremarkable. Small BILATERAL renal cysts. No hydronephrosis, hydroureter, or urinary tract calcification. Bladder decompressed. Stomach/Bowel: Scattered bowel wall thickening, mild in degree, may be related to ascites. Diverticulosis of descending and sigmoid colon without definite wall thickening to suggest diverticulitis. Appendix surgically absent by history. Prior gastric bypass surgery. Vascular/Lymphatic: Extensive atherosclerotic calcifications aorta and iliac arteries without aneurysm. No definite adenopathy. Reproductive: Atrophic uterus with unremarkable ovaries Other: Significant ascites. No definite peritoneal based mass. Scattered subcutaneous edema. No free air. No hernia. Musculoskeletal: Osseous demineralization. Review of the MIP images confirms the above findings. IMPRESSION: No evidence of pulmonary embolism. BILATERAL pleural effusions with compressive atelectasis of the lower lobes and posterior upper lobes. Slight decrease in size of pancreatic head mass. Marked hepatic steatosis. Slight decrease in size of nodule at the anterior surface LEFT lobe liver. CBD stent without biliary dilatation. Scattered bowel wall thickening, may be related to ascites, unable to exclude enteritis. Distal colonic diverticulosis without evidence of diverticulitis. LEFT adrenal myelolipoma. Aortic Atherosclerosis (ICD10-I70.0). Electronically Signed   By: Lavonia Dana M.D.   On: 05/29/2020 15:00   US Paracentesis  Result Date: 06/06/2020 INDICATION: Patient with history of pancreatic cancer, spontaneous bacterial peritonitis, sepsis, recurrent ascites. Request made for diagnostic paracentesis. EXAM: ULTRASOUND  GUIDED DIAGNOSTIC PARACENTESIS MEDICATIONS: 1% lidocaine to skin and subcutaneous tissue COMPLICATIONS: None immediate. PROCEDURE: Informed written consent was obtained from the patient after a discussion of the  risks, benefits and alternatives to treatment. A timeout was performed prior to the initiation of the procedure. Initial ultrasound scanning demonstrates a moderate amount of ascites within the left mid to lower abdominal quadrant. The left mid to lower abdomen was prepped and draped in the usual sterile fashion. 1% lidocaine was used for local anesthesia. Following this, a 19 gauge, 10-cm, Yueh catheter was introduced. An ultrasound image was saved for documentation purposes. The paracentesis was performed. The catheter was removed and a dressing was applied. The patient tolerated the procedure well without immediate post procedural complication. FINDINGS: A total of approximately 150 cc of clear, yellow fluid was removed. Samples were sent to the laboratory as requested by the clinical team. IMPRESSION: Successful ultrasound-guided diagnostic paracentesis yielding 150 cc of peritoneal fluid. Read by: Rowe Robert, PA-C Electronically Signed   By: Markus Daft M.D.   On: 06/06/2020 15:39   US Paracentesis  Result Date: 06/02/2020 INDICATION: Patient with history of pancreatic cancer, spontaneous bacterial peritonitis, sepsis, recurrent ascites. Request made for diagnostic and therapeutic paracentesis up to 1 liter. EXAM: ULTRASOUND GUIDED DIAGNOSTIC AND THERAPEUTIC PARACENTESIS MEDICATIONS: 1% lidocaine to skin and subcutaneous tissue COMPLICATIONS: None immediate. PROCEDURE: Informed written consent was obtained from the patient via interpreter after a discussion of the risks, benefits and alternatives to treatment. A timeout was performed prior to the initiation of the procedure. Initial ultrasound scanning demonstrates a small to moderate amount of ascites within the left upper to mid abdominal  quadrant. The left upper to mid abdomen was prepped and draped in the usual sterile fashion. 1% lidocaine was used for local anesthesia. Following this, a 19 gauge, 10-cm, Yueh catheter was introduced. An ultrasound image was saved for documentation purposes. The paracentesis was performed. The catheter was removed and a dressing was applied. The patient tolerated the procedure well without immediate post procedural complication. FINDINGS: A total of approximately 1 liter of hazy, yellow fluid was removed. Samples were sent to the laboratory as requested by the clinical team. IMPRESSION: Successful ultrasound-guided diagnostic and therapeutic paracentesis yielding 1 liter of peritoneal fluid. Read by: Rowe Robert, PA-C Electronically Signed   By: Jacqulynn Cadet M.D.   On: 06/02/2020 16:36   DG Chest Port 1 View  Result Date: 05/29/2020 CLINICAL DATA:  Evaluate for sepsis. Shortness of breath and cough. History of pancreas cancer. EXAM: PORTABLE CHEST 1 VIEW COMPARISON:  CT chest 11/29/2019 FINDINGS: Right chest wall port a catheter identified with tip at the cavoatrial junction. Normal heart size. A small left pleural effusion is suspected. Additionally, there are patchy airspace densities within the mid and lower left lung zone. Right lung appears clear. IMPRESSION: Left mid and lower lung zone airspace densities compatible with pneumonia. Suspect small left pleural effusion. Electronically Signed   By: Kerby Moors M.D.   On: 05/29/2020 10:34   ECHOCARDIOGRAM COMPLETE  Result Date: 06/06/2020    ECHOCARDIOGRAM REPORT   Patient Name:   Tamara Wood Date of Exam: 06/06/2020 Medical Rec #:  [id number]    Height:       64.0 in Accession #:    [id number]   Weight:       161.7 lb Date of Birth:  Feb 06, 1946     BSA:          1.787 m Patient Age:    72 years     BP:           118/67 mmHg Patient Gender: F  HR:           79 bpm. Exam Location:  Inpatient Procedure: 2D Echo, Cardiac Doppler and Color  Doppler Indications:    Study is to rule out right heart failure.; R06.02 SOB  History:        Patient has no prior history of Echocardiogram examinations.                 Abnormal ECG; Risk Factors:Diabetes and Hypertension. Pancreatic                 cancer. Ascites.  Sonographer:    Roseanna Rainbow RDCS Referring Phys: [id number] [place]  Sonographer Comments: Technically difficult study due to poor echo windows. IMPRESSIONS  1. Left ventricular ejection fraction, by estimation, is 65 to 70%. The left ventricle has normal function. The left ventricle has no regional wall motion abnormalities. There is mild left ventricular hypertrophy. Left ventricular diastolic parameters are consistent with Grade I diastolic dysfunction (impaired relaxation).  2. Right ventricular systolic function is normal. The right ventricular size is normal. Tricuspid regurgitation signal is inadequate for assessing PA pressure.  3. Moderate pleural effusion in the left lateral region.  4. The mitral valve is grossly normal. Trivial mitral valve regurgitation.  5. The aortic valve is tricuspid. Aortic valve regurgitation is not visualized. Comparison(s): No prior Echocardiogram. FINDINGS  Left Ventricle: Left ventricular ejection fraction, by estimation, is 65 to 70%. The left ventricle has normal function. The left ventricle has no regional wall motion abnormalities. The left ventricular internal cavity size was normal in size. There is  mild left ventricular hypertrophy. Left ventricular diastolic parameters are consistent with Grade I diastolic dysfunction (impaired relaxation). Normal left ventricular filling pressure. Right Ventricle: The right ventricular size is normal. No increase in right ventricular wall thickness. Right ventricular systolic function is normal. Tricuspid regurgitation signal is inadequate for assessing PA pressure. Left Atrium: Left atrial size was normal in size. Right Atrium: Right atrial size was normal  in size. Pericardium: There is no evidence of pericardial effusion. Mitral Valve: The mitral valve is grossly normal. Trivial mitral valve regurgitation. Tricuspid Valve: The tricuspid valve is grossly normal. Tricuspid valve regurgitation is trivial. Aortic Valve: The aortic valve is tricuspid. Aortic valve regurgitation is not visualized. Pulmonic Valve: The pulmonic valve was normal in structure. Pulmonic valve regurgitation is not visualized. Aorta: The aortic root and ascending aorta are structurally normal, with no evidence of dilitation. IAS/Shunts: No atrial level shunt detected by color flow Doppler. Additional Comments: There is a moderate pleural effusion in the left lateral region. Mild ascites is present.  LEFT VENTRICLE PLAX 2D LVIDd:         3.70 cm     Diastology LVIDs:         2.30 cm     LV e' medial:    7.94 cm/s LV PW:         1.30 cm     LV E/e' medial:  7.8 LV IVS:        1.20 cm     LV e' lateral:   11.00 cm/s LVOT diam:     1.90 cm     LV E/e' lateral: 5.6 LV SV:         62 LV SV Index:   35 LVOT Area:     2.84 cm  LV Volumes (MOD) LV vol d, MOD A2C: 56.1 ml LV vol d, MOD A4C: 52.0 ml LV vol s, MOD A2C: 22.2  ml LV vol s, MOD A4C: 15.8 ml LV SV MOD A2C:     33.9 ml LV SV MOD A4C:     52.0 ml LV SV MOD BP:      35.7 ml RIGHT VENTRICLE TAPSE (M-mode): 1.5 cm LEFT ATRIUM             Index       RIGHT ATRIUM          Index LA diam:        2.50 cm 1.40 cm/m  RA Area:     7.37 cm LA Vol (A2C):   24.5 ml 13.71 ml/m RA Volume:   12.10 ml 6.77 ml/m LA Vol (A4C):   21.2 ml 11.86 ml/m LA Biplane Vol: 24.2 ml 13.54 ml/m  AORTIC VALVE LVOT Vmax:   113.00 cm/s LVOT Vmean:  73.700 cm/s LVOT VTI:    0.220 m  AORTA Ao Root diam: 3.40 cm Ao Asc diam:  3.30 cm MITRAL VALVE MV Area (PHT): 3.53 cm    SHUNTS MV Decel Time: 215 msec    Systemic VTI:  0.22 m MV E velocity: 62.15 cm/s  Systemic Diam: 1.90 cm MV A velocity: 89.50 cm/s MV E/A ratio:  0.69 Lyman Bishop MD Electronically signed by Lyman Bishop  MD Signature Date/Time: 06/06/2020/4:32:16 PM    Final        Subjective: Patient seen and examined at bedside.  Poor historian.  Denies worsening abdominal pain,.  No overnight fever or vomiting reported.  Discharge Exam: Vitals:   06/07/20 2047 06/08/20 0429  BP: (!) 108/59 122/67  Pulse: 81 69  Resp: 16 16  Temp: 98 F (36.7 C) 97.6 F (36.4 C)  SpO2: 100% 100%    General: Pt is alert, awake, not in acute distress.  Elderly female lying in bed.  Poor historian. Cardiovascular: rate controlled, S1/S2 + Respiratory: bilateral decreased breath sounds at bases with some scattered crackles Abdominal: Soft, slightly distended, NT, ND, bowel sounds + Extremities: Trace lower extremity edema; no cyanosis    The results of significant diagnostics from this hospitalization (including imaging, microbiology, ancillary and laboratory) are listed below for reference.     Microbiology: Recent Results (from the past 240 hour(s))  Blood Culture (routine x 2)     Status: None   Collection Time: 05/29/20 11:00 AM   Specimen: BLOOD  Result Value Ref Range Status   Specimen Description   Final    BLOOD LEFT ANTECUBITAL Performed at [facility] [address], [place], [place] [address]    Special Requests   Final    BOTTLES DRAWN AEROBIC AND ANAEROBIC Blood Culture results may not be optimal due to an inadequate volume of blood received in culture bottles Performed at [facility] [address] [place], [place] [address]    Culture   Final    NO GROWTH 5 DAYS Performed at [facility], [place] [address] [place], [place] [address]    Report Status 06/03/2020 FINAL  Final  Blood Culture (routine x 2)     Status: None   Collection Time: 05/29/20 11:12 AM   Specimen: BLOOD  Result Value Ref Range Status   Specimen Description   Final    BLOOD RIGHT ANTECUBITAL Performed at [facility] [address]  [place], [place] [address]    Special Requests   Final    BOTTLES DRAWN AEROBIC AND ANAEROBIC Blood Culture adequate volume Performed at [facility], [place] [address], [place], [place] [address]    Culture   Final    NO GROWTH 5 DAYS Performed at [facility], [place] [address], [place], [place] [address]    Report Status 06/03/2020 FINAL  Final  Resp Panel by RT-PCR (Flu A&B, Covid) Nasopharyngeal Swab     Status: None   Collection Time: 05/29/20 11:17 AM   Specimen: Nasopharyngeal Swab; Nasopharyngeal(NP) swabs in vial transport medium  Result Value Ref Range Status   SARS Coronavirus 2 by RT PCR NEGATIVE NEGATIVE Final    Comment: (NOTE) SARS-CoV-2 target nucleic acids are NOT DETECTED.  The SARS-CoV-2 RNA is generally detectable in upper respiratory specimens during the acute phase of infection. The lowest concentration of SARS-CoV-2 viral copies this assay can detect is 138 copies/mL. A negative result does not preclude SARS-Cov-2 infection and should not be used as the sole basis for treatment or other patient management decisions. A negative result may occur with  improper specimen collection/handling, submission of specimen other than nasopharyngeal swab, presence of viral mutation(s) within the areas targeted by this assay, and inadequate number of viral copies(<138 copies/mL). A negative result must be combined with clinical observations, patient history, and epidemiological information. The expected result is Negative.  Fact Sheet for Patients:  EntrepreneurPulse.com.au  Fact Sheet for Healthcare Providers:  IncredibleEmployment.be  This test is no t yet approved or cleared by the [place] FDA and  has been authorized for detection and/or diagnosis of SARS-CoV-2 by FDA under an Emergency Use Authorization (EUA). This EUA will remain  in effect (meaning this test can be used) for the duration of the COVID-19  declaration under Section 564(b)(1) of the Act, 21 U.S.C.section 360bbb-3(b)(1), unless the authorization is terminated  or revoked sooner.       Influenza A by PCR NEGATIVE NEGATIVE Final   Influenza B by PCR NEGATIVE NEGATIVE Final    Comment: (NOTE) The Xpert Xpress SARS-CoV-2/FLU/RSV plus assay is intended as an aid in the diagnosis of influenza from Nasopharyngeal swab specimens and should not be used as a sole basis for treatment. Nasal washings and aspirates are unacceptable for Xpert Xpress SARS-CoV-2/FLU/RSV testing.  Fact Sheet for Patients: EntrepreneurPulse.com.au  Fact Sheet for Healthcare Providers: IncredibleEmployment.be  This test is not yet approved or cleared by the [place] FDA and has been authorized for detection and/or diagnosis of SARS-CoV-2 by FDA under an Emergency Use Authorization (EUA). This EUA will remain in effect (meaning this test can be used) for the duration of the COVID-19 declaration under Section 564(b)(1) of the Act, 21 U.S.C. section 360bbb-3(b)(1), unless the authorization is terminated or revoked.  Performed at [facility], [place] [address], [place], [place] [address]   Urine culture     Status: None   Collection Time: 05/29/20 12:48 PM   Specimen: In/Out Cath Urine  Result Value Ref Range Status   Specimen Description   Final    IN/OUT CATH URINE Performed at [facility] [address] [place], [place] [address]    Special Requests   Final    NONE Performed at [facility], [place] [address], [place], [place] [address]    Culture   Final    NO GROWTH Performed at [facility], [place] [address] [place], [place] [address]    Report Status 05/30/2020 FINAL  Final  Body fluid culture (includes gram stain)     Status: None   Collection Time:  05/29/20  4:34 PM   Specimen: Peritoneal Washings; Peritoneal Fluid  Result Value Ref  Range Status   Specimen Description   Final    PERITONEAL FLUID Performed at [facility], [place] [address] [place], [place] [address]    Special Requests   Final    NONE Performed at [facility], [place] [address], [place], [place] [address]    Gram Stain   Final    FEW WBC PRESENT, PREDOMINANTLY MONONUCLEAR NO ORGANISMS SEEN    Culture   Final    NO GROWTH 3 DAYS Performed at [facility] [address] [place], [place] [address]    Report Status 06/02/2020 FINAL  Final  MRSA PCR Screening     Status: None   Collection Time: 05/30/20  1:26 AM   Specimen: Nasal Mucosa; Nasopharyngeal  Result Value Ref Range Status   MRSA by PCR NEGATIVE NEGATIVE Final    Comment:        The GeneXpert MRSA Assay (FDA approved for NASAL specimens only), is one component of a comprehensive MRSA colonization surveillance program. It is not intended to diagnose MRSA infection nor to guide or monitor treatment for MRSA infections. Performed at [facility], [place] [address] [place], [place] [address]   Culture, body fluid-bottle     Status: None   Collection Time: 06/02/20  6:42 PM   Specimen: Peritoneal Washings  Result Value Ref Range Status   Specimen Description PERITONEAL  Final   Special Requests NONE  Final   Culture   Final    NO GROWTH 5 DAYS Performed at [facility], [address], [place], [place] [address]    Report Status 06/08/2020 FINAL  Final  Gram stain     Status: None   Collection Time: 06/02/20  6:42 PM   Specimen: Peritoneal Washings  Result Value Ref Range Status   Specimen Description PERITONEAL  Final   Special Requests NONE  Final   Gram Stain   Final    FEW WBC PRESENT, PREDOMINANTLY MONONUCLEAR NO ORGANISMS SEEN Performed at [facility], [place] [address], [place], [place] [address]    Report Status 06/04/2020 FINAL  Final  C Difficile Quick Screen (NO PCR Reflex)     Status: None   Collection  Time: 06/05/20  1:33 PM   Specimen: STOOL  Result Value Ref Range Status   C Diff antigen NEGATIVE NEGATIVE Final   C Diff toxin NEGATIVE NEGATIVE Final   C Diff interpretation No C. difficile detected.  Final    Comment: Performed at [facility], [place] [address] [place],  [address]  Culture, body fluid-bottle     Status: None (Preliminary result)   Collection Time: 06/06/20  3:35 PM   Specimen: Peritoneal Washings  Result Value Ref Range Status   Specimen Description PERITONEAL  Final   Special Requests NONE  Final   Culture   Final    NO GROWTH 2 DAYS Performed at [facility], [address], [place],  [address]    Report Status PENDING  Incomplete  Gram stain     Status: None   Collection Time: 06/06/20  3:35 PM   Specimen: Peritoneal Washings  Result Value Ref Range Status   Specimen Description PERITONEAL  Final   Special Requests NONE  Final   Gram Stain   Final    WBC PRESENT,BOTH PMN AND MONONUCLEAR NO ORGANISMS SEEN CYTOSPIN  SMEAR Performed at [facility], [place] [address] [place], [place] [address]    Report Status 06/07/2020 FINAL  Final     Labs: BNP (last 3 results) No results for input(s): BNP in the last 8760 hours. Basic Metabolic Panel: Recent Labs  Lab 06/01/20 1750 06/02/20 0407 06/03/20 0500 06/04/20 0606 06/05/20 0407 06/06/20 0511 06/07/20 0500 06/08/20 0500  NA 141   < > 139 142 142 139 141 140  K 2.6*   < > 3.6 3.9 4.0 3.5 3.7 4.0  CL 108   < > 106 108 109 108 109 107  CO2 22   < > 25 26 27 25 26 25   GLUCOSE 149*   < > 116* 128* 117* 90 105* 126*  BUN 18   < > 21 18 18 19 18 20   CREATININE 0.70   < > 0.57 0.46 0.33* 0.51 0.43* 0.64  CALCIUM 7.6*   < > 7.4* 7.7* 7.5* 7.8* 7.4* 7.5*  MG 1.4*  --  1.9  --   --   --   --  1.4*   < > = values in this interval not displayed.   Liver Function Tests: Recent Labs  Lab 06/04/20 0606 06/05/20 0407 06/06/20 0511 06/07/20 0500 06/08/20 0500   AST 59* 62* 59* 49* 48*  ALT 20 21 23 22 23   ALKPHOS 299* 272* 311* 263* 252*  BILITOT 2.6* 1.8* 2.1* 2.2* 2.4*  PROT 4.9* 4.5* 5.2* 4.5* 4.8*  ALBUMIN 2.4* 2.1* 2.3* 2.0* 2.0*   No results for input(s): LIPASE, AMYLASE in the last 168 hours. No results for input(s): AMMONIA in the last 168 hours. CBC: Recent Labs  Lab 06/02/20 0407 06/04/20 0606 06/05/20 0407 06/06/20 0511 06/07/20 0500 06/08/20 0500  WBC 13.0* 11.4* 10.3 14.9* 8.6 9.0  NEUTROABS 11.4*  --   --   --   --  7.3  HGB 7.5* 7.6* 7.2* 8.3* 7.1* 7.8*  HCT 21.9* 22.2* 21.0* 24.2* 21.6* 23.7*  MCV 101.9* 102.3* 102.9* 105.7* 109.1* 111.3*  PLT 85* 80* 76* 92* 64* 64*   Cardiac Enzymes: No results for input(s): CKTOTAL, CKMB, CKMBINDEX, TROPONINI in the last 168 hours. BNP: Invalid input(s): POCBNP CBG: Recent Labs  Lab 06/07/20 1627 06/07/20 2050 06/08/20 0009 06/08/20 0432 06/08/20 0732  GLUCAP 146* 97 93 108* 104*   D-Dimer No results for input(s): DDIMER in the last 72 hours. Hgb A1c No results for input(s): HGBA1C in the last 72 hours. Lipid Profile No results for input(s): CHOL, HDL, LDLCALC, TRIG, CHOLHDL, LDLDIRECT in the last 72 hours. Thyroid function studies No results for input(s): TSH, T4TOTAL, T3FREE, THYROIDAB in the last 72 hours.  Invalid input(s): FREET3 Anemia work up No results for input(s): VITAMINB12, FOLATE, FERRITIN, TIBC, IRON, RETICCTPCT in the last 72 hours. Urinalysis    Component Value Date/Time   COLORURINE AMBER (A) 05/29/2020 1248   APPEARANCEUR HAZY (A) 05/29/2020 1248   LABSPEC 1.018 05/29/2020 1248   PHURINE 5.0 05/29/2020 1248   GLUCOSEU NEGATIVE 05/29/2020 1248   HGBUR NEGATIVE 05/29/2020 [place] 05/29/2020 1248   KETONESUR NEGATIVE 05/29/2020 1248   PROTEINUR NEGATIVE 05/29/2020 1248   NITRITE NEGATIVE 05/29/2020 1248   LEUKOCYTESUR NEGATIVE 05/29/2020 1248   Sepsis Labs Invalid input(s): PROCALCITONIN,  WBC,   LACTICIDVEN Microbiology Recent Results (from the past 240 hour(s))  Blood Culture (routine x 2)     Status: None   Collection Time: 05/29/20 11:00 AM   Specimen: BLOOD  Result Value Ref Range Status   Specimen Description   Final    BLOOD LEFT ANTECUBITAL Performed at [facility] [address] [place], [place] [address]    Special Requests   Final    BOTTLES DRAWN AEROBIC AND ANAEROBIC Blood Culture results may not be optimal due to an inadequate volume of blood received in culture bottles Performed at [facility] [address] [place], [place] [address]    Culture   Final    NO GROWTH 5 DAYS Performed at [facility], [place] [address], [place], [place] [address]    Report Status 06/03/2020 FINAL  Final  Blood Culture (routine x 2)     Status: None   Collection Time: 05/29/20 11:12 AM   Specimen: BLOOD  Result Value Ref Range Status   Specimen Description   Final    BLOOD RIGHT ANTECUBITAL Performed at [facility] [address] [place], [place] [address]    Special Requests   Final    BOTTLES DRAWN AEROBIC AND ANAEROBIC Blood Culture adequate volume Performed at [facility] [address] [place], [place] [address]    Culture   Final    NO GROWTH 5 DAYS Performed at [facility], [place] [address], [place], [place] [address]    Report Status 06/03/2020 FINAL  Final  Resp Panel by RT-PCR (Flu A&B, Covid) Nasopharyngeal Swab     Status: None   Collection Time: 05/29/20 11:17 AM   Specimen: Nasopharyngeal Swab; Nasopharyngeal(NP) swabs in vial transport medium  Result Value Ref Range Status   SARS Coronavirus 2 by RT PCR NEGATIVE NEGATIVE Final    Comment: (NOTE) SARS-CoV-2 target nucleic acids are NOT DETECTED.  The SARS-CoV-2 RNA is generally detectable in upper respiratory specimens during the acute phase of infection. The lowest concentration of SARS-CoV-2 viral copies  this assay can detect is 138 copies/mL. A negative result does not preclude SARS-Cov-2 infection and should not be used as the sole basis for treatment or other patient management decisions. A negative result may occur with  improper specimen collection/handling, submission of specimen other than nasopharyngeal swab, presence of viral mutation(s) within the areas targeted by this assay, and inadequate number of viral copies(<138 copies/mL). A negative result must be combined with clinical observations, patient history, and epidemiological information. The expected result is Negative.  Fact Sheet for Patients:  EntrepreneurPulse.com.au  Fact Sheet for Healthcare Providers:  IncredibleEmployment.be  This test is no t yet approved or cleared by the [place] FDA and  has been authorized for detection and/or diagnosis of SARS-CoV-2 by FDA under an Emergency Use Authorization (EUA). This EUA will remain  in effect (meaning this test can be used) for the duration of the COVID-19 declaration under Section 564(b)(1) of the Act, 21 U.S.C.section 360bbb-3(b)(1), unless the authorization is terminated  or revoked sooner.       Influenza A by PCR NEGATIVE NEGATIVE Final   Influenza B by PCR NEGATIVE NEGATIVE Final    Comment: (NOTE) The Xpert Xpress SARS-CoV-2/FLU/RSV plus assay is intended as an aid in the diagnosis of influenza from Nasopharyngeal swab specimens and should not be used as a sole basis for treatment. Nasal washings and aspirates are unacceptable for Xpert Xpress SARS-CoV-2/FLU/RSV testing.  Fact Sheet for Patients: EntrepreneurPulse.com.au  Fact Sheet for Healthcare Providers: IncredibleEmployment.be  This test is not yet approved or cleared by the [place] FDA and has been authorized for detection  and/or diagnosis of SARS-CoV-2 by FDA under an Emergency Use Authorization (EUA). This EUA  will remain in effect (meaning this test can be used) for the duration of the COVID-19 declaration under Section 564(b)(1) of the Act, 21 U.S.C. section 360bbb-3(b)(1), unless the authorization is terminated or revoked.  Performed at [facility], [place] [address] [place], [place] [address]   Urine culture     Status: None   Collection Time: 05/29/20 12:48 PM   Specimen: In/Out Cath Urine  Result Value Ref Range Status   Specimen Description   Final    IN/OUT CATH URINE Performed at [facility] [address], [place], [place] [address]    Special Requests   Final    NONE Performed at [facility], [place] [address], [place], [place] [address]    Culture   Final    NO GROWTH Performed at [facility], [place] [address], [place], [place] [address]    Report Status 05/30/2020 FINAL  Final  Body fluid culture (includes gram stain)     Status: None   Collection Time: 05/29/20  4:34 PM   Specimen: Peritoneal Washings; Peritoneal Fluid  Result Value Ref Range Status   Specimen Description   Final    PERITONEAL FLUID Performed at [facility], [place] [address] [place], [place] [address]    Special Requests   Final    NONE Performed at [facility], [place] [address], [place], [place] [address]    Gram Stain   Final    FEW WBC PRESENT, PREDOMINANTLY MONONUCLEAR NO ORGANISMS SEEN    Culture   Final    NO GROWTH 3 DAYS Performed at [facility] [address], [place], [place] [address]    Report Status 06/02/2020 FINAL  Final  MRSA PCR Screening     Status: None   Collection Time: 05/30/20  1:26 AM   Specimen: Nasal Mucosa; Nasopharyngeal  Result Value Ref Range Status   MRSA by PCR NEGATIVE NEGATIVE Final    Comment:        The GeneXpert MRSA Assay (FDA approved for NASAL specimens only), is one component of a comprehensive MRSA colonization surveillance program. It is not intended  to diagnose MRSA infection nor to guide or monitor treatment for MRSA infections. Performed at [facility], [place] [address], [place], [place] [address]   Culture, body fluid-bottle     Status: None   Collection Time: 06/02/20  6:42 PM   Specimen: Peritoneal Washings  Result Value Ref Range Status   Specimen Description PERITONEAL  Final   Special Requests NONE  Final   Culture   Final    NO GROWTH 5 DAYS Performed at [facility], [address] [place], [place] [address]    Report Status 06/08/2020 FINAL  Final  Gram stain     Status: None   Collection Time: 06/02/20  6:42 PM   Specimen: Peritoneal Washings  Result Value Ref Range Status   Specimen Description PERITONEAL  Final   Special Requests NONE  Final   Gram Stain   Final    FEW WBC PRESENT, PREDOMINANTLY MONONUCLEAR NO ORGANISMS SEEN Performed at [facility], [place] [address], [place], [place] [address]    Report Status 06/04/2020 FINAL  Final  C Difficile Quick Screen (NO PCR Reflex)     Status: None   Collection Time: 06/05/20  1:33 PM  Specimen: STOOL  Result Value Ref Range Status   C Diff antigen NEGATIVE NEGATIVE Final   C Diff toxin NEGATIVE NEGATIVE Final   C Diff interpretation No C. difficile detected.  Final    Comment: Performed at [facility], [place] [address] [place], [place] [address]  Culture, body fluid-bottle     Status: None (Preliminary result)   Collection Time: 06/06/20  3:35 PM   Specimen: Peritoneal Washings  Result Value Ref Range Status   Specimen Description PERITONEAL  Final   Special Requests NONE  Final   Culture   Final    NO GROWTH 2 DAYS Performed at [facility], [address], [place], [place] [address]    Report Status PENDING  Incomplete  Gram stain     Status: None   Collection Time: 06/06/20  3:35 PM   Specimen: Peritoneal Washings  Result Value Ref Range Status   Specimen Description PERITONEAL  Final    Special Requests NONE  Final   Gram Stain   Final    WBC PRESENT,BOTH PMN AND MONONUCLEAR NO ORGANISMS SEEN CYTOSPIN SMEAR Performed at [facility], [address], [place], [place] [address]    Report Status 06/07/2020 FINAL  Final     Time coordinating discharge: 35 minutes  SIGNED:   Aline August, MD  [facility] 06/08/2020, 10:42 AM

## 2020-06-08 NOTE — TOC Transition Note (Signed)
Transition of Care [facility]) - CM/SW Discharge Note   Patient Details  Name: Tamara Wood MRN: [id number] Date of Birth: 1945/11/28  [facility] CM/SW Contact:  Lynnell Catalan, RN Phone Number: 06/08/2020, 11:53 AM   Clinical Narrative:     Pt to dc to [facility] today. RN to call report to [phone], and pt will go to room 126. Yellow DNR on chart for transport. [facility] to transport pt to [facility].  Final next level of care: Skilled Nursing Facility Barriers to Discharge: No Barriers Identified   Patient Goals and CMS Choice Patient states their goals for this hospitalization and ongoing recovery are:: i would like to go home CMS Medicare.gov Compare Post Acute Care list provided to:: Patient Represenative (must comment) (Daughter in [place]) Choice offered to / list presented to : Adult Children  Discharge Placement              Patient chooses bed at: [facility] Patient to be transferred to facility by: [place] Name of family member notified: Family in the room    Discharge Plan and Services   Discharge Planning Services: CM Consult Post Acute Care Choice: [place]              Social Determinants of Health (SDOH) Interventions     Readmission Risk Interventions Readmission Risk Prevention Plan 06/06/2020  Transportation Screening Complete  PCP or Specialist Appt within 3-5 Days Complete  HRI or [place] Complete  Social Work Consult for [place] Planning/Counseling Complete  Palliative Care Screening Not Applicable  Medication Review Press [employer]) Complete  Some recent data might be hidden

## 2020-06-08 NOTE — Care Management Important Message (Signed)
Important Message  Patient Details IM Letter given to the Patient. Name: Tamara Wood MRN: [id number] Date of Birth: 06-Sep-1945   Medicare Important Message Given:  Yes     Kerin Salen 06/08/2020, 11:40 AM

## 2020-06-08 NOTE — Consult Note (Signed)
Consultation Note Date: 06/08/2020   Patient Name: Tamara Wood  DOB: 09/23/1945  MRN: [id number]  Age / Sex: 74 y.o., female  PCP: Tamara Wood, Tamara Halsted, MD Referring Physician: Aline August, MD  Reason for Consultation: Establishing goals of care and Psychosocial/spiritual support  HPI/Patient Profile: 74 y.o. female   admitted on 05/29/2020 with PMH significant for  DM, HTN, GERD, appendectomy, cholecystectomy, chronic pancreatitis, Stage III ( cT4, cN1, cMO) pancreatic adenocarcinoma diagnosed in [place] Feb 2021. In March 2021 a Whipple surgery was aborted due to tumor imvasion of SMV, she instead had a gastrojejunostomy . Currently under treatment with Dr. Burr Wood.   Patient is a Sales promotion account executive Witness, doesn't accept blood products.  Admitted through the emergency room with abdominal pain, leukocytosis, elevated lactic acid, tachycardia.     CT scan w/ contrast shows pancreatic head mass to be slightly smaller, scattered mild bowel wall thickening possibly related to ascites, diverticulosis, CBD without biliary duct dilation, marked steatosis, bilateral pleural effusions.   Severe hepatic steatosis on CT scan. No known history of cirrhosis.  She was found to have pneumonia and SBP.  During the hospitalization, his condition has gradually improved although she still remains very deconditioned.  Her latest paracentesis showed improvement of SBP.  She is currently hemodynamically stable.     PT has recommended SNF placement.       Plan is for disposition to skilled nursing facility today for short-term rehab.  Patient is high risk for decompensation.  Today is day 10 of this hospitalization.  Patient and family face treatment option decisions,advanced directive decisions, and anticipatory care needs.  Clinical Assessment and Goals of Care:   This NP Tamara Wood reviewed medical records, received  report from team, assessed the patient and then meet at the patient's bedside.  Had brief conversation with my limited Spanish and then with patient's permission/ she defers conversation to DIL, I  spoke to her DIL Tamara Wood by telephone   to discuss diagnosis, prognosis, GOC, EOL wishes disposition and options.   Concept of Palliative Care was introduced as specialized medical care for people and their families living with serious illness.  If focuses on providing relief from the symptoms and stress of a serious illness.  The goal is to improve quality of life for both the patient and the family.  Created space and opportunity for patient  and family to explore thoughts and feelings regarding current medical information.  Tamara Wood verbalizes a clear understanding of the seriousness of the patient's current medical situation.  Patient and family recognize that prognosis is limited.  However at this time patient will discharge to skilled nursing facility for short-term rehab.  Her hope is that she will regain some strength.  Family is realistic about the best case scenario versus worst-case scenario and they will take things "1 step at a time"  Education offered on hospice benefit.  Recommend outpatient community-based palliative to follow patient at facility for ongoing support.   A  discussion was had today regarding advanced directives.  Concepts specific to code status, artifical feeding and hydration, continued IV antibiotics and rehospitalization was had.  The difference between a aggressive medical intervention path  and a palliative comfort care path for this patient at this time was had.  Values and goals of care important to patient and family were attempted to be elicited.   DNR/DNI status clarified.  DNR/DNI form completed and placed in hard chart    Questions and concerns addressed.  Patient  encouraged to call with questions or concerns.       HCPOA/advance care directive and H POA  documents noted in chart.  Patient's son Tamara Wood  is listed first and Tamara Wood is second on directive.    Patient is Jehovah witness document noted no blood products    SUMMARY OF RECOMMENDATIONS    Code Status/Advance Care Planning:  DNRdocumented today    Palliative Prophylaxis:   Aspiration, Bowel Regimen, Delirium Protocol, Frequent Pain Assessment and Oral Care  Additional Recommendations (Limitations, Scope, Preferences):  Avoid Hospitalization and No Blood Transfusions  Psycho-social/Spiritual:   Desire for further Chaplaincy support:no  Additional Recommendations: Education on Hospice  Prognosis:   < 6 months  Discharge Planning: Plan is for discharge to [place] [facility] skilled nursing facility today.   [facility] for rehab with Palliative care service follow-up      Primary Diagnoses: Present on Admission: . Pancreatic cancer ([place]) . Hypertension . Ascites . Sepsis ([place]) . Acute lower UTI . Elevated LFTs . Thrombocytopenia ([place]) . SBP (spontaneous bacterial peritonitis) ([place]) . Hypoglycemia without diagnosis of diabetes mellitus   I have reviewed the medical record, interviewed the patient and family, and examined the patient. The following aspects are pertinent.  Past Medical History:  Diagnosis Date  . Diabetes mellitus without complication ([place])   . Hypertension   . Pancreatic cancer [facility])    Social History   Socioeconomic History  . Marital status: Married    Spouse name: Not on file  . Number of children: 6  . Years of education: Not on file  . Highest education level: Not on file  Occupational History  . Not on file  Tobacco Use  . Smoking status: Never Smoker  . Smokeless tobacco: Never Used  Substance and Sexual Activity  . Alcohol use: Never  . Drug use: Never  . Sexual activity: Not on file  Other Topics Concern  . Not on file  Social History Narrative  . Not on file   Social  Determinants of Health   Financial Resource Strain: Not on file  Food Insecurity: Not on file  Transportation Needs: Not on file  Physical Activity: Not on file  Stress: Not on file  Social Connections: Not on file   Family History  Problem Relation Age of Onset  . Diabetes Mother   . Diabetes Sister   . Diabetes Brother    Scheduled Meds: . (feeding supplement) PROSource Plus  30 mL Oral BID BM  . vitamin C  500 mg Oral Daily  . Chlorhexidine Gluconate Cloth  6 each Topical Q0600  . feeding supplement  1 Container Oral BID AC  . furosemide  40 mg Oral Daily  . hydrocortisone  1 application Rectal BID  . insulin aspart  0-9 Units Subcutaneous Q4H  . loperamide  2 mg Oral BID  . mouth rinse  15 mL Mouth Rinse BID  . metoprolol tartrate  12.5 mg Oral BID  . multivitamin with minerals  1 tablet Oral Daily  .  nutrition supplement (JUVEN)  1 packet Oral BID BM  . pantoprazole  20 mg Oral Daily  . sodium chloride flush  10-40 mL Intracatheter Q12H  . zinc sulfate  220 mg Oral Daily   Continuous Infusions: . cefTRIAXone (ROCEPHIN)  IV Stopped (06/07/20 2340)   PRN Meds:.albuterol, sodium chloride flush Medications Prior to Admission:  Prior to Admission medications   Medication Sig Start Date End Date Taking? Authorizing Provider  acetaminophen (TYLENOL) 500 MG tablet Take 500-1,000 mg by mouth daily as needed for moderate pain.   Yes [provider]  amLODipine (NORVASC) 5 MG tablet Take 1 tablet (5 mg total) by mouth daily. 12/22/19  Yes Erline Hau, MD  ASPIRIN LOW DOSE 81 MG EC tablet Take 81 mg by mouth daily. 07/31/19  Yes [provider]  Calcium Carb-Cholecalciferol (CALCIUM/VITAMIN D) 500-200 MG-UNIT TABS Take 1 tablet by mouth in the morning and at bedtime.  11/10/19  Yes [provider]  DM-Doxylamine-Acetaminophen (NYQUIL HBP COLD & FLU) 15-6.25-325 MG/15ML LIQD Take 5 mLs by mouth 2 (two) times daily as needed (cold).   Yes  [provider]  furosemide (LASIX) 20 MG tablet Take 0.5 (half) tablet every other day for swelling Patient taking differently: Take 10 mg by mouth every other day. 05/18/20  Yes Burton, Regan Rakers K, NP  KLOR-CON M20 20 MEQ tablet TAKE 1 TABLET BY MOUTH EVERY DAY Patient taking differently: Take 20 mEq by mouth daily. 03/09/20  Yes Alla Feeling, NP  Magnesium 250 MG TABS Take 1 tablet (250 mg total) by mouth daily. 12/07/19  Yes Tamara Wood, Tamara Halsted, MD  ondansetron (ZOFRAN) 8 MG tablet TAKE 1 TABLET (8 MG TOTAL) BY MOUTH 2 (TWO) TIMES DAILY AS NEEDED (NAUSEA OR VOMITING). Patient taking differently: Take 8 mg by mouth 2 (two) times daily as needed for nausea or vomiting (Nausea or vomiting). 05/17/20  Yes Truitt Merle, MD  prochlorperazine (COMPAZINE) 10 MG tablet Take 1 tablet (10 mg total) by mouth every 6 (six) hours as needed (Nausea or vomiting). Patient taking differently: Take 10 mg by mouth every 6 (six) hours as needed for nausea or vomiting (Nausea or vomiting). 04/06/20  Yes Truitt Merle, MD  traMADol (ULTRAM) 50 MG tablet Take 0.5-1 tablets (25-50 mg total) by mouth every 6 (six) hours as needed. Patient taking differently: Take 25-50 mg by mouth every 6 (six) hours as needed for moderate pain. 11/22/19  Yes Truitt Merle, MD  vitamin B-12 (CYANOCOBALAMIN) 1000 MCG tablet Take 1 tablet (1,000 mcg total) by mouth daily. 11/22/19  Yes Truitt Merle, MD  ciprofloxacin (CIPRO) 500 MG tablet Take 1 tablet (500 mg total) by mouth 2 (two) times daily. Patient taking differently: Take 500 mg by mouth 2 (two) times daily. Start date : 05/23/20 05/19/20   Alla Feeling, NP  furosemide (LASIX) 40 MG tablet Take 1 tablet (40 mg total) by mouth daily. 06/08/20   Tamara August, MD  glucose blood test strip 1 each by Other route in the morning and at bedtime. Dx E11.9  For OneTouch Ultra 2 01/28/20   Tamara Wood, Tamara Halsted, MD  HYDROcodone-acetaminophen (NORCO/VICODIN) 5-325 MG tablet Take 1 tablet by  mouth every 6 (six) hours as needed. Patient not taking: No sig reported 12/30/19   Truitt Merle, MD  hydrocortisone (ANUSOL-HC) 2.5 % rectal cream Place 1 application rectally 2 (two) times daily as needed for anal itching. 06/08/20   Tamara August, MD  lidocaine-prilocaine (EMLA) cream Apply 1  application topically daily as needed (pain). 06/08/20   Tamara August, MD  loperamide (IMODIUM) 2 MG capsule Take 1 capsule (2 mg total) by mouth 2 (two) times daily. 06/08/20   Tamara August, MD  metFORMIN (GLUCOPHAGE) 1000 MG tablet Take 1 tablet (1,000 mg total) by mouth daily. 06/08/20   Tamara August, MD  metoprolol tartrate (LOPRESSOR) 25 MG tablet Take 0.5 tablets (12.5 mg total) by mouth 2 (two) times daily. 06/08/20   Tamara August, MD  metoprolol tartrate (LOPRESSOR) 50 MG tablet Take 1 tablet (50 mg total) by mouth 2 (two) times daily. 12/07/19   Tamara Wood, Tamara Halsted, MD  OneTouch Delica Lancets [id number] MISC Use twice daily for glucose control, Dx E11.9 02/03/20   Tamara Wood, Tamara Halsted, MD  pantoprazole ([place]) 20 MG tablet TAKE 1 TABLET BY MOUTH EVERY DAY 06/07/20   Truitt Merle, MD  sulfamethoxazole-trimethoprim (BACTRIM DS) 800-160 MG tablet Take 1 tablet by mouth daily. 06/08/20   Tamara August, MD   Allergies  Allergen Reactions  . Shellfish Allergy Other (See Comments)    Upset stomach   Review of Systems  Physical Exam  Vital Signs: BP 122/67 (BP Location: Right Arm)   Pulse 69   Temp 97.6 F (36.4 C) (Oral)   Resp 16   Ht 5\' 4"  (1.626 m)   Wt 73.4 kg   SpO2 100%   BMI 27.77 kg/m  Pain Scale: 0-10   Pain Score: 0-No pain   SpO2: SpO2: 100 % O2 Device:SpO2: 100 % O2 Flow Rate: .O2 Flow Rate (L/min): 3 L/min  IO: Intake/output summary:   Intake/Output Summary (Last 24 hours) at 06/08/2020 [id number] Last data filed at 06/08/2020 0430 Gross per 24 hour  Intake --  Output 550 ml  Net -550 ml    LBM: Last BM Date: 06/08/20 Baseline Weight: Weight: 56.2  kg Most recent weight: Weight: 73.4 kg     Palliative Assessment/Data: 30 %    Discussed with Dr Starla Link  Time In: 0800 Time Out: 0910 Time Total: 70 minutes Greater than 50%  of this time was spent counseling and coordinating care related to the above assessment and plan.  Signed by: Tamara Lessen, NP   Please contact Palliative Medicine Team phone at [phone] for questions and concerns.  For individual provider: See Shea Evans

## 2020-06-11 LAB — CULTURE, BODY FLUID W GRAM STAIN -BOTTLE: Culture: NO GROWTH

## 2020-06-14 ENCOUNTER — Inpatient Hospital Stay: Payer: 59 | Admitting: Nurse Practitioner

## 2020-06-14 ENCOUNTER — Inpatient Hospital Stay: Payer: 59

## 2020-06-15 ENCOUNTER — Inpatient Hospital Stay: Payer: 59

## 2020-06-19 ENCOUNTER — Encounter: Payer: Self-pay | Admitting: Hematology

## 2020-06-19 ENCOUNTER — Telehealth: Payer: Self-pay | Admitting: Hematology

## 2020-06-19 ENCOUNTER — Telehealth: Payer: Self-pay

## 2020-06-19 NOTE — Telephone Encounter (Signed)
Scheduled follow-up appointment per 1/1 schedule message. Mailed patient's schedule to her.

## 2020-06-19 NOTE — Telephone Encounter (Signed)
Left message with translating service to schedule follow-up labs, port flush, and visit w/provider in 1-2 weeks per 1/1 schedule message. Gave option to call back to schedule.

## 2020-06-19 NOTE — Telephone Encounter (Signed)
I spoke with Tammy at [facility] and arranged for transportation to our appts on 1/11 at 1030 arrival.

## 2020-06-20 ENCOUNTER — Non-Acute Institutional Stay: Payer: 59 | Admitting: Hospice

## 2020-06-20 ENCOUNTER — Other Ambulatory Visit: Payer: Self-pay

## 2020-06-20 DIAGNOSIS — C259 Malignant neoplasm of pancreas, unspecified: Secondary | ICD-10-CM

## 2020-06-20 DIAGNOSIS — Z515 Encounter for palliative care: Secondary | ICD-10-CM

## 2020-06-20 NOTE — Telephone Encounter (Signed)
I spoke with [name], Ms Crookshanks's daughter in law.  I let her know Dr Mosetta Putt doesn't not have any available appts this week.  I offered to schedule Ms Cottman with [facility] but [name] declined.  I told her they can request that the physician at [facility] house evaluate her.  She verbalized understanding.

## 2020-06-20 NOTE — Progress Notes (Signed)
PATIENT NAME: Tamara Wood [address] [address] [id number] (home)  DOB: [birth year] MRN: [id number]  PRIMARY CARE PROVIDER:    Isaac Bliss, Rayford Halsted, MD,  [place] [address] [phone]  REFERRING PROVIDER:   Isaac Bliss, Rayford Halsted, MD [address] [place],  [place] [address] [phone]  RESPONSIBLE PARTY:   Extended Emergency Contact Information Primary Emergency Contact: [place], [place], [place] [address] Johnnette [place]itter of [place] Phone: [phone] Mobile Phone: [phone] Relation: Son Secondary Emergency Contact: [place],  [address] Johnnette Litter of [place] Phone: [phone] Mobile Phone: [phone] Relation: Relative Preferred language: English Interpreter needed? No  [name] is the POA  I met face to face with patient and family in home/facility.  ADVANCE CARE PLANNING/RECOMMENDATIONS/PLAN:    Visit at the request of Martinique Blattenberger, NP for palliative consult. Visit consisted of building trust and discussions on Palliative Medicine as specialized medical care for people living with serious illness, aimed at facilitating better quality of life through symptoms relief, assisting with advance care plan and establishing complex decision making.  NP called and spoke with Maribel who is patient's POA.  She endorsed palliative service.  Discussion on the difference between Palliative and Hospice care. Palliative care and hospice have similar goals of managing symptoms, promoting comfort, improving quality of life, and maintaining a person's dignity. However, palliative care may be offered during any phase of a serious illness, while hospice care is usually offered when a person is expected to live for 6 months or less.   Advance Care Planning: Our advance care planning conversation included a discussion about:    The value and importance of advance care  planning  Exploration of goals of care in the event of a sudden injury or illness  Identification and preparation of a healthcare agent          Review and updating or creation of an advance directive document         Discussion on decision not to resuscitate or to de-escalate disease focused treatments due to poor prognosis. [name] reported that family will be interested in hospice service in the future when patient qualifies for it. CODE STATUS: CODE STATUS reviewed.  [name] affirms that patient is a DO NOT RESUSCITATE.  There is a signed DNR in facility records; same document uploaded to epic today.  GOALS OF CARE: Goals of care include to maximize quality of life and symptom management.  Follow up Palliative Care Visit: Palliative care will continue to follow for goals of care clarification and symptom management.   Symptom Management: Patient was diagnosed with pancreatic cancer March 2021 and was receiving chemotherapy. Per [name], chemo was stopped following recent hospitalization 12/13-12/23/2021 for sepsis/pneumonia/spontaneous bacterial peritonitis.  Patient was treated and discharged to SNF for rehab.  Today patient denies cough, shortness of breath, chest pain, diarrhea  She denies pain/discomfort.  Nursing with no complaints.  Encouraged ongoing care. Palliative will continue to monitor for symptom management/decline and make recommendations as needed.   Family /Caregiver/Community Supports: Patient in SNF for rehab  I spent 1 hour and 25 minutes providing this initial consultation; time includes time spent with patient/family, chart review, provider coordination,  and documentation. More than 50% of the time in this consultation was spent on counseling patient and coordinating communication.   CHIEF COMPLAIN/HISTORY OF PRESENT ILLNESS:  Tamara Wood  is a 75 y.o. female with multiple medical problems including metastatic pancreatic cancer, generalized body weakness, type 2 diabetes  mellitus, hypertension. History obtained from review of EMR, discussion with nursing staff, [name] and patient.   Palliative Care was asked to follow this patient by consultation request of Martinique Blattenberger, NP to help address advance care planning and complex decision making. Thank you for the opportunity to participate in the care of Tamara Wood    CODE STATUS: DNR  PPS: Weak 50%  HOSPICE ELIGIBILITY/DIAGNOSIS: TBD  PAST MEDICAL HISTORY:  Past Medical History:  Diagnosis Date  . Diabetes mellitus without complication ([place])   . Hypertension   . Pancreatic cancer [facility])     SOCIAL HX:  Social History   Tobacco Use  . Smoking status: Never Smoker  . Smokeless tobacco: Never Used  Substance Use Topics  . Alcohol use: Never   FAMILY HX:  Family History  Problem Relation Age of Onset  . Diabetes Mother   . Diabetes Sister   . Diabetes Brother      Labs:   No results for input(s): WBC, HGB, HCT, PLT, MCV in the last 168 hours. No results for input(s): NA, K, CL, CO2, BUN, CREATININE, GLUCOSE in the last 168 hours.  ALLERGIES:  Allergies  Allergen Reactions  . Shellfish Allergy Other (See Comments)    Upset stomach     PERTINENT MEDICATIONS:  Outpatient Encounter Medications as of 06/20/2020  Medication Sig  . acetaminophen (TYLENOL) 500 MG tablet Take 500-1,000 mg by mouth daily as needed for moderate pain.  . Calcium Carb-Cholecalciferol (CALCIUM/VITAMIN D) 500-200 MG-UNIT TABS Take 1 tablet by mouth in the morning and at bedtime.   . furosemide (LASIX) 40 MG tablet Take 1 tablet (40 mg total) by mouth daily.  Marland Kitchen glucose blood test strip 1 each by Other route in the morning and at bedtime. Dx E11.9  For OneTouch Ultra 2  . hydrocortisone (ANUSOL-HC) 2.5 % rectal cream Place 1 application rectally 2 (two) times daily as needed for anal itching.  Marland Kitchen KLOR-CON M20 20 MEQ tablet TAKE 1 TABLET BY MOUTH EVERY DAY (Patient taking differently: Take 20 mEq by mouth daily.)  .  lidocaine-prilocaine (EMLA) cream Apply 1 application topically daily as needed (pain).  Marland Kitchen loperamide (IMODIUM) 2 MG capsule Take 1 capsule (2 mg total) by mouth 2 (two) times daily.  . Magnesium 250 MG TABS Take 1 tablet (250 mg total) by mouth daily.  . metFORMIN (GLUCOPHAGE) 1000 MG tablet Take 1 tablet (1,000 mg total) by mouth daily.  . metoprolol tartrate (LOPRESSOR) 25 MG tablet Take 0.5 tablets (12.5 mg total) by mouth 2 (two) times daily.  . ondansetron (ZOFRAN) 8 MG tablet TAKE 1 TABLET (8 MG TOTAL) BY MOUTH 2 (TWO) TIMES DAILY AS NEEDED (NAUSEA OR VOMITING). (Patient taking differently: Take 8 mg by mouth 2 (two) times daily as needed for nausea or vomiting (Nausea or vomiting).)  . OneTouch Delica Lancets 62V MISC Use twice daily for glucose control, Dx E11.9  . pantoprazole (PROTONIX) 20 MG tablet TAKE 1 TABLET BY MOUTH EVERY DAY  . prochlorperazine (COMPAZINE) 10 MG tablet Take 1 tablet (10 mg total) by mouth every 6 (six) hours as needed (Nausea or vomiting). (Patient taking differently: Take 10 mg by mouth every 6 (six) hours as needed for nausea or vomiting (Nausea or vomiting).)  . sulfamethoxazole-trimethoprim (BACTRIM DS) 800-160 MG tablet Take 1 tablet by mouth daily.  . vitamin B-12 (CYANOCOBALAMIN) 1000 MCG tablet Take  1 tablet (1,000 mcg total) by mouth daily.   No facility-administered encounter medications on file as of 06/20/2020.    PHYSICAL EXAM/ROS:  General: NAD, cooperative Cardiovascular: regular rate and rhythm; denies chest pain Pulmonary: clear ant /post fields Abdomen: soft, nontender, + bowel sounds GU: no suprapubic tenderness Extremities: Mild edema to bilateral lower extremities; encouraged elevation Skin: no rashes to visible skin Neurological: Weakness but otherwise nonfocal  Note: Portions of this note were generated with Dragon dictation software. Dictation errors may occur despite best attempts at proofreading.  Teodoro Spray, NP

## 2020-06-26 NOTE — Progress Notes (Signed)
Pt was admitted to [facility] hospital by me today, see my admission H$P.  Tamara Wood  06/27/2020

## 2020-06-27 ENCOUNTER — Encounter (HOSPITAL_COMMUNITY): Payer: Self-pay | Admitting: Hematology

## 2020-06-27 ENCOUNTER — Inpatient Hospital Stay: Payer: 59

## 2020-06-27 ENCOUNTER — Inpatient Hospital Stay (HOSPITAL_COMMUNITY)
Admission: AD | Admit: 2020-06-27 | Discharge: 2020-06-29 | DRG: 436 | Disposition: A | Discharge status: Hospice | Payer: 59 | Source: Skilled Nursing Facility | Attending: Hematology | Admitting: Hematology

## 2020-06-27 ENCOUNTER — Inpatient Hospital Stay (HOSPITAL_COMMUNITY)
Admission: RE | Admit: 2020-06-27 | Discharge: 2020-06-27 | Disposition: A | Discharge status: Home / Self Care | Payer: 59 | Source: Ambulatory Visit | Attending: Hematology | Admitting: Hematology

## 2020-06-27 ENCOUNTER — Inpatient Hospital Stay: Payer: 59 | Attending: Hematology | Admitting: Hematology

## 2020-06-27 ENCOUNTER — Other Ambulatory Visit: Payer: Self-pay

## 2020-06-27 VITALS — BP 88/55 | HR 80 | Temp 98.0°F | Resp 18 | Ht 64.0 in

## 2020-06-27 DIAGNOSIS — R188 Other ascites: Secondary | ICD-10-CM | POA: Diagnosis present

## 2020-06-27 DIAGNOSIS — Z20822 Contact with and (suspected) exposure to covid-19: Secondary | ICD-10-CM | POA: Diagnosis present

## 2020-06-27 DIAGNOSIS — Z9221 Personal history of antineoplastic chemotherapy: Secondary | ICD-10-CM

## 2020-06-27 DIAGNOSIS — Z515 Encounter for palliative care: Secondary | ICD-10-CM

## 2020-06-27 DIAGNOSIS — I959 Hypotension, unspecified: Secondary | ICD-10-CM | POA: Diagnosis present

## 2020-06-27 DIAGNOSIS — R059 Cough, unspecified: Secondary | ICD-10-CM

## 2020-06-27 DIAGNOSIS — K7290 Hepatic failure, unspecified without coma: Secondary | ICD-10-CM | POA: Diagnosis present

## 2020-06-27 DIAGNOSIS — C250 Malignant neoplasm of head of pancreas: Secondary | ICD-10-CM

## 2020-06-27 DIAGNOSIS — N179 Acute kidney failure, unspecified: Secondary | ICD-10-CM

## 2020-06-27 DIAGNOSIS — E119 Type 2 diabetes mellitus without complications: Secondary | ICD-10-CM

## 2020-06-27 DIAGNOSIS — Z66 Do not resuscitate: Secondary | ICD-10-CM | POA: Diagnosis present

## 2020-06-27 DIAGNOSIS — C259 Malignant neoplasm of pancreas, unspecified: Principal | ICD-10-CM | POA: Diagnosis present

## 2020-06-27 DIAGNOSIS — I10 Essential (primary) hypertension: Secondary | ICD-10-CM

## 2020-06-27 DIAGNOSIS — R1010 Upper abdominal pain, unspecified: Secondary | ICD-10-CM

## 2020-06-27 DIAGNOSIS — E860 Dehydration: Secondary | ICD-10-CM | POA: Diagnosis present

## 2020-06-27 DIAGNOSIS — R109 Unspecified abdominal pain: Secondary | ICD-10-CM

## 2020-06-27 DIAGNOSIS — K7200 Acute and subacute hepatic failure without coma: Principal | ICD-10-CM

## 2020-06-27 DIAGNOSIS — Z9884 Bariatric surgery status: Secondary | ICD-10-CM

## 2020-06-27 LAB — CBC WITH DIFFERENTIAL (CANCER CENTER ONLY)
Abs Immature Granulocytes: 0.01 10*3/uL (ref 0.00–0.07)
Basophils Absolute: 0 10*3/uL (ref 0.0–0.1)
Basophils Relative: 1 %
Eosinophils Absolute: 0 10*3/uL (ref 0.0–0.5)
Eosinophils Relative: 1 %
HCT: 34.5 % — ABNORMAL LOW (ref 36.0–46.0)
Hemoglobin: 11.3 g/dL — ABNORMAL LOW (ref 12.0–15.0)
Immature Granulocytes: 0 %
Lymphocytes Relative: 16 %
Lymphs Abs: 0.7 10*3/uL (ref 0.7–4.0)
MCH: 35.8 pg — ABNORMAL HIGH (ref 26.0–34.0)
MCHC: 32.8 g/dL (ref 30.0–36.0)
MCV: 109.2 fL — ABNORMAL HIGH (ref 80.0–100.0)
Monocytes Absolute: 0.2 10*3/uL (ref 0.1–1.0)
Monocytes Relative: 6 %
Neutro Abs: 3.2 10*3/uL (ref 1.7–7.7)
Neutrophils Relative %: 76 %
Platelet Count: 78 10*3/uL — ABNORMAL LOW (ref 150–400)
RBC: 3.16 MIL/uL — ABNORMAL LOW (ref 3.87–5.11)
RDW: 22.2 % — ABNORMAL HIGH (ref 11.5–15.5)
WBC Count: 4.2 10*3/uL (ref 4.0–10.5)
nRBC: 0 % (ref 0.0–0.2)

## 2020-06-27 LAB — CMP (CANCER CENTER ONLY)
ALT: 49 U/L — ABNORMAL HIGH (ref 0–44)
AST: 92 U/L — ABNORMAL HIGH (ref 15–41)
Albumin: 1.8 g/dL — ABNORMAL LOW (ref 3.5–5.0)
Alkaline Phosphatase: 366 U/L — ABNORMAL HIGH (ref 38–126)
Anion gap: 9 (ref 5–15)
BUN: 32 mg/dL — ABNORMAL HIGH (ref 8–23)
CO2: 20 mmol/L — ABNORMAL LOW (ref 22–32)
Calcium: 7.8 mg/dL — ABNORMAL LOW (ref 8.9–10.3)
Chloride: 108 mmol/L (ref 98–111)
Creatinine: 2.11 mg/dL — ABNORMAL HIGH (ref 0.44–1.00)
GFR, Estimated: 24 mL/min — ABNORMAL LOW (ref >60)
Glucose, Bld: 104 mg/dL — ABNORMAL HIGH (ref 70–99)
Potassium: 5.4 mmol/L — ABNORMAL HIGH (ref 3.5–5.1)
Sodium: 137 mmol/L (ref 135–145)
Total Bilirubin: 7.4 mg/dL (ref 0.3–1.2)
Total Protein: 7 g/dL (ref 6.5–8.1)

## 2020-06-27 MED ORDER — GUAIFENESIN-DM 100-10 MG/5ML PO SYRP
10.0000 mL | ORAL_SOLUTION | ORAL | Status: DC | PRN
  Administered 2020-06-27 – 2020-06-29 (×4): 10 mL via ORAL
  Filled 2020-06-27 (×4): qty 10

## 2020-06-27 MED ORDER — PANTOPRAZOLE SODIUM 40 MG PO TBEC
40.0000 mg | DELAYED_RELEASE_TABLET | Freq: Every day | ORAL | Status: DC
  Administered 2020-06-28 – 2020-06-29 (×2): 40 mg via ORAL
  Filled 2020-06-27 (×2): qty 1

## 2020-06-27 MED ORDER — ONDANSETRON HCL 4 MG PO TABS: 4.0000 mg | ORAL_TABLET | Freq: Three times a day (TID) | ORAL | Status: DC | PRN

## 2020-06-27 MED ORDER — SODIUM CHLORIDE 0.9 % IV SOLN: INTRAVENOUS | Status: AC

## 2020-06-27 MED ORDER — SENNOSIDES-DOCUSATE SODIUM 8.6-50 MG PO TABS: 1.0000 | ORAL_TABLET | Freq: Every evening | ORAL | Status: DC | PRN

## 2020-06-27 MED ORDER — HYDROCORTISONE (PERIANAL) 2.5 % EX CREA: 1.0000 "application " | TOPICAL_CREAM | Freq: Two times a day (BID) | CUTANEOUS | Status: DC | PRN

## 2020-06-27 MED ORDER — SODIUM CHLORIDE 0.9 % IV SOLN
8.0000 mg | Freq: Three times a day (TID) | INTRAVENOUS | Status: DC | PRN
  Filled 2020-06-27: qty 4

## 2020-06-27 MED ORDER — OXYCODONE HCL 5 MG PO TABS
5.0000 mg | ORAL_TABLET | ORAL | Status: DC | PRN
  Administered 2020-06-29: 5 mg via ORAL
  Filled 2020-06-27: qty 1

## 2020-06-27 MED ORDER — ONDANSETRON 4 MG PO TBDP: 4.0000 mg | ORAL_TABLET | Freq: Three times a day (TID) | ORAL | Status: DC | PRN

## 2020-06-27 MED ORDER — ALUM & MAG HYDROXIDE-SIMETH 200-200-20 MG/5ML PO SUSP: 60.0000 mL | ORAL | Status: DC | PRN

## 2020-06-27 MED ORDER — ONDANSETRON HCL 4 MG/2ML IJ SOLN: 4.0000 mg | Freq: Three times a day (TID) | INTRAMUSCULAR | Status: DC | PRN

## 2020-06-27 MED ORDER — SODIUM CHLORIDE 0.9 % IV SOLN
INTRAVENOUS | Status: AC
  Filled 2020-06-27 (×2): qty 250

## 2020-06-27 NOTE — Progress Notes (Signed)
Patient taken to 1606.  Bedside report given to [facility], RN.  All questions answered.

## 2020-06-27 NOTE — H&P (Addendum)
[place]  [phone]   HEMATOLOGY ONCOLOGY INPATIENT ADMISSION H&P  Tamara Wood  DOB: 1945-08-01  MR#: [id number]  CSN#: [id number]     Patient Care Team: Isaac Bliss, Rayford Halsted, MD as PCP - General (Internal Medicine) Jonnie Finner, RN as Oncology Nurse Navigator Truitt Merle, MD as Consulting Physician (Hematology)  CHIEF COMPLAIN: generalized weakness, abdominal bloating and anorexia    History of present illness:   Tamara Wood is a 75 yo Spanish-speaking female, with history of resectable pancreatic cancer, previously on chemotherapy, recently hospitalized for SBP from May 29, 2020 to June 08, 2020, currently a resident at [facility], is being admitted from a clinic to vascular hospital for worsening liver failure, ascites, and hypotension.   Since her recent hospital discharge 3 weeks ago, she has been getting limited to physical therapy every day nursing home.  She is very fatigued, not able to walk independently, and spends most time in bed.  Appetite has been low, eating small meals.  She has noticed worsening abdominal distention, bilateral lower extremity edema in the past 2 weeks.  No fever or chills, no worsening abdominal pain.  She woke up this morning, feels extremely fatigued, but did not come to my scheduled office visit by facility transportation.  Her daughter-in-law also presented to the clinic with her, and served as interpreter.  Her lab today showed significant worsening liver functions, with total bilirubin 7.4, creatinine 2.11, from normal previously 3 weeks ago.  Ultrasound of the abdomen showed no bile duct obstruction, large volume ascites.  She was borderline hypotensive when she was in my clinic, received 500 cc normal saline, blood pressure did not improve.  She is awake, alert, oriented.  She is being admitted for further management in the hospital, and transition to hospice.   MEDICAL HISTORY:  Past Medical History:   Diagnosis Date  . Diabetes mellitus without complication ([place])   . Hypertension   . Pancreatic cancer [facility])     SURGICAL HISTORY: Past Surgical History:  Procedure Laterality Date  . APPENDECTOMY    . BACK SURGERY    . CHOLECYSTECTOMY    . GASTRIC BYPASS OPEN  08/2019   aborted whipple, GJ bypass for obstruction  . IR GASTROSTOMY TUBE REMOVAL  11/30/2019  . IR IMAGING GUIDED PORT INSERTION  12/08/2019    SOCIAL HISTORY: Social History   Socioeconomic History  . Marital status: Married    Spouse name: Not on file  . Number of children: 6  . Years of education: Not on file  . Highest education level: Not on file  Occupational History  . Not on file  Tobacco Use  . Smoking status: Never Smoker  . Smokeless tobacco: Never Used  Substance and Sexual Activity  . Alcohol use: Never  . Drug use: Never  . Sexual activity: Not on file  Other Topics Concern  . Not on file  Social History Narrative  . Not on file   Social Determinants of Health   Financial Resource Strain: Not on file  Food Insecurity: Not on file  Transportation Needs: Not on file  Physical Activity: Not on file  Stress: Not on file  Social Connections: Not on file  Intimate Partner Violence: Not on file    FAMILY HISTORY: Family History  Problem Relation Age of Onset  . Diabetes Mother   . Diabetes Sister   . Diabetes Brother     ALLERGIES:  is allergic to shellfish allergy.  MEDICATIONS:  Current Facility-Administered Medications  Medication Dose Route Frequency Provider Last Rate Last Admin  . 0.9 %  sodium chloride infusion   Intravenous Continuous Truitt Merle, MD      . alum & mag hydroxide-simeth (MAALOX/MYLANTA) 200-200-20 MG/5ML suspension 60 mL  60 mL Oral Q4H PRN Truitt Merle, MD      . guaiFENesin-dextromethorphan (ROBITUSSIN DM) 100-10 MG/5ML syrup 10 mL  10 mL Oral Q4H PRN Truitt Merle, MD      . hydrocortisone (ANUSOL-HC) 2.5 % rectal cream 1 application  1 application Rectal BID PRN  Truitt Merle, MD      . ondansetron [facility]) tablet 4-8 mg  4-8 mg Oral Q8H PRN Truitt Merle, MD       Or  . ondansetron (ZOFRAN-ODT) disintegrating tablet 4-8 mg  4-8 mg Oral Q8H PRN Truitt Merle, MD       Or  . ondansetron [facility]) injection 4 mg  4 mg Intravenous Q8H PRN Truitt Merle, MD       Or  . ondansetron (ZOFRAN) 8 mg in sodium chloride 0.9 % 50 mL IVPB  8 mg Intravenous Q8H PRN Truitt Merle, MD      . oxyCODONE (Oxy IR/ROXICODONE) immediate release tablet 5 mg  5 mg Oral Q4H PRN Truitt Merle, MD      . Derrill Memo ON 06/28/2020] pantoprazole (PROTONIX) EC tablet 40 mg  40 mg Oral Daily Truitt Merle, MD      . senna-docusate (Senokot-S) tablet 1 tablet  1 tablet Oral QHS PRN Truitt Merle, MD        REVIEW OF SYSTEMS:   Constitutional: Denies fevers, chills or abnormal night sweats (+) worsening fatigue Eyes: Denies blurriness of vision, double vision or watery eyes Ears, nose, mouth, throat, and face: Denies mucositis or sore throat Respiratory: (+) Chronic dry cough Cardiovascular: Denies palpitation, chest discomfort, (+) severe bilateral lower extremity edema Gastrointestinal:  See HPI  Skin: Denies abnormal skin rashes Lymphatics: Denies new lymphadenopathy or easy bruising Neurological:Denies numbness, tingling or new weaknesses Behavioral/Psych: Mood is stable, no new changes  All other systems were reviewed with the patient and are negative.  PHYSICAL EXAMINATION: ECOG PERFORMANCE STATUS: 4 - Bedbound There were no vitals taken for this visit. GENERAL:alert, oriented, chronically ill-appearing SKIN: skin color, texture, turgor are normal, no rashes or significant lesions EYES: normal, conjunctiva are pink and non-injected, sclera clear OROPHARYNX:no exudate, no erythema and lips, buccal mucosa, and tongue normal  NECK: supple, thyroid normal size, non-tender, without nodularity LYMPH:  no palpable lymphadenopathy in the cervical, axillary or inguinal LUNGS: clear to auscultation and percussion  with normal breathing effort HEART: regular rate & rhythm and no murmurs and no lower extremity edema ABDOMEN:abdomen soft, distended, mild tenderness, large volume ascites Musculoskeletal:no cyanosis of digits and no clubbing, significant bilateral lower extremity edema up to knee  PSYCH: alert & oriented x 3 with fluent speech NEURO: no focal motor/sensory deficits  LABORATORY DATA:  I have reviewed the data as listed Lab Results  Component Value Date   WBC 4.2 06/27/2020   HGB 11.3 (L) 06/27/2020   HCT 34.5 (L) 06/27/2020   MCV 109.2 (H) 06/27/2020   PLT 78 (L) 06/27/2020   Recent Labs    02/10/20 1007 02/24/20 1058 03/09/20 0828 03/23/20 1013 06/07/20 0500 06/08/20 0500 06/27/20 1225  NA 139 140 137   < > 141 140 137  K 3.9 3.6 4.0   < > 3.7 4.0 5.4*  CL 103 104 103   < >  109 107 108  CO2 29 29 30    < > 26 25 20*  GLUCOSE 161* 195* 140*   < > 105* 126* 104*  BUN 13 7* 7*   < > 18 20 32*  CREATININE 0.79 0.76 0.78   < > 0.43* 0.64 2.11*  CALCIUM 9.0 8.3* 8.1*   < > 7.4* 7.5* 7.8*  GFRNONAA >60 >60 >60   < > >60 >60 24*  GFRAA >60 >60 >60  --   --   --   --   PROT 6.3* 6.0* 6.2*   < > 4.5* 4.8* 7.0  ALBUMIN 2.7* 2.5* 2.4*   < > 2.0* 2.0* 1.8*  AST 59* 71* 66*   < > 49* 48* 92*  ALT 32 29 31   < > 22 23 49*  ALKPHOS 441* 375* 409*   < > 263* 252* 366*  BILITOT 0.7 0.6 0.6   < > 2.2* 2.4* 7.4*   < > = values in this interval not displayed.    RADIOGRAPHIC STUDIES: I have personally reviewed the radiological images as listed and agreed with the findings in the report. CT Angio Chest PE W and/or Wo Contrast  Result Date: 05/29/2020 CLINICAL DATA:  Syncopal episode, acute abdominal pain, weakness, history of pancreatic cancer on chemotherapy EXAM: CT ANGIOGRAPHY CHEST CT ABDOMEN AND PELVIS WITH CONTRAST TECHNIQUE: Multidetector CT imaging of the chest was performed using the standard protocol during bolus administration of intravenous contrast. Multiplanar CT image  reconstructions and MIPs were obtained to evaluate the vascular anatomy. Multidetector CT imaging of the abdomen and pelvis was performed using the standard protocol during bolus administration of intravenous contrast. CONTRAST:  149mL OMNIPAQUE IOHEXOL 350 MG/ML SOLN IV. No oral contrast. COMPARISON:  CT abdomen and pelvis 03/06/2020, CT chest 11/29/2019 FINDINGS: CTA CHEST FINDINGS Cardiovascular: Atherosclerotic calcifications aorta and coronary arteries. RIGHT jugular Port-A-Cath with tip in RIGHT atrium. Aorta normal caliber without aneurysm or dissection. Heart normal size. No pericardial effusion. Pulmonary arteries adequately opacified and grossly patent. No evidence of pulmonary embolism. Mediastinum/Nodes: Soft is unremarkable. Base of cervical region normal appearance. No thoracic adenopathy. Lungs/Pleura: Moderate pleural effusions bilaterally. Compressive atelectasis of BILATERAL lower lobes and posterior upper lobes minimal infiltrate in upper lobes. No pneumothorax. No definite pulmonary mass/nodule. Musculoskeletal: Diffuse osseous demineralization. No discrete bone lesion. Review of the MIP images confirms the above findings. CT ABDOMEN and PELVIS FINDINGS Hepatobiliary: Marked hepatic steatosis. Gallbladder surgically absent. CBD stent. No hepatic mass or biliary dilatation. Small nodule anterior to the LEFT lobe of the liver on the previous exam is poorly demonstrated on the current study, subjectively smaller but difficult to accurately measure due to ascites. Pancreas: Mass at pancreatic head/uncinate 4.0 x 4.1 cm, appears slightly smaller. Mild pancreatic ductal dilatation at body and proximal tail. No additional mass. 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 Adrenal glands otherwise unremarkable. Small BILATERAL renal cysts. No hydronephrosis, hydroureter, or urinary tract calcification. Bladder decompressed.  Stomach/Bowel: Scattered bowel wall thickening, mild in degree, may be related to ascites. Diverticulosis of descending and sigmoid colon without definite wall thickening to suggest diverticulitis. Appendix surgically absent by history. Prior gastric bypass surgery. Vascular/Lymphatic: Extensive atherosclerotic calcifications aorta and iliac arteries without aneurysm. No definite adenopathy. Reproductive: Atrophic uterus with unremarkable ovaries Other: Significant ascites. No definite peritoneal based mass. Scattered subcutaneous edema. No free air. No hernia. Musculoskeletal: Osseous demineralization. Review of the MIP images confirms the above  findings. IMPRESSION: No evidence of pulmonary embolism. BILATERAL pleural effusions with compressive atelectasis of the lower lobes and posterior upper lobes. Slight decrease in size of pancreatic head mass. Marked hepatic steatosis. Slight decrease in size of nodule at the anterior surface LEFT lobe liver. CBD stent without biliary dilatation. Scattered bowel wall thickening, may be related to ascites, unable to exclude enteritis. Distal colonic diverticulosis without evidence of diverticulitis. LEFT adrenal myelolipoma. Aortic Atherosclerosis (ICD10-I70.0). Electronically Signed   By: Lavonia Dana M.D.   On: 05/29/2020 15:00   CT ABDOMEN PELVIS W CONTRAST  Result Date: 05/29/2020 CLINICAL DATA:  Syncopal episode, acute abdominal pain, weakness, history of pancreatic cancer on chemotherapy EXAM: CT ANGIOGRAPHY CHEST CT ABDOMEN AND PELVIS WITH CONTRAST TECHNIQUE: Multidetector CT imaging of the chest was performed using the standard protocol during bolus administration of intravenous contrast. Multiplanar CT image reconstructions and MIPs were obtained to evaluate the vascular anatomy. Multidetector CT imaging of the abdomen and pelvis was performed using the standard protocol during bolus administration of intravenous contrast. CONTRAST:  155mL OMNIPAQUE IOHEXOL 350  MG/ML SOLN IV. No oral contrast. COMPARISON:  CT abdomen and pelvis 03/06/2020, CT chest 11/29/2019 FINDINGS: CTA CHEST FINDINGS Cardiovascular: Atherosclerotic calcifications aorta and coronary arteries. RIGHT jugular Port-A-Cath with tip in RIGHT atrium. Aorta normal caliber without aneurysm or dissection. Heart normal size. No pericardial effusion. Pulmonary arteries adequately opacified and grossly patent. No evidence of pulmonary embolism. Mediastinum/Nodes: Soft is unremarkable. Base of cervical region normal appearance. No thoracic adenopathy. Lungs/Pleura: Moderate pleural effusions bilaterally. Compressive atelectasis of BILATERAL lower lobes and posterior upper lobes minimal infiltrate in upper lobes. No pneumothorax. No definite pulmonary mass/nodule. Musculoskeletal: Diffuse osseous demineralization. No discrete bone lesion. Review of the MIP images confirms the above findings. CT ABDOMEN and PELVIS FINDINGS Hepatobiliary: Marked hepatic steatosis. Gallbladder surgically absent. CBD stent. No hepatic mass or biliary dilatation. Small nodule anterior to the LEFT lobe of the liver on the previous exam is poorly demonstrated on the current study, subjectively smaller but difficult to accurately measure due to ascites. Pancreas: Mass at pancreatic head/uncinate 4.0 x 4.1 cm, appears slightly smaller. Mild pancreatic ductal dilatation at body and proximal tail. No additional mass. 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 Adrenal glands otherwise unremarkable. Small BILATERAL renal cysts. No hydronephrosis, hydroureter, or urinary tract calcification. Bladder decompressed. Stomach/Bowel: Scattered bowel wall thickening, mild in degree, may be related to ascites. Diverticulosis of descending and sigmoid colon without definite wall thickening to suggest diverticulitis. Appendix surgically absent by history. Prior gastric bypass  surgery. Vascular/Lymphatic: Extensive atherosclerotic calcifications aorta and iliac arteries without aneurysm. No definite adenopathy. Reproductive: Atrophic uterus with unremarkable ovaries Other: Significant ascites. No definite peritoneal based mass. Scattered subcutaneous edema. No free air. No hernia. Musculoskeletal: Osseous demineralization. Review of the MIP images confirms the above findings. IMPRESSION: No evidence of pulmonary embolism. BILATERAL pleural effusions with compressive atelectasis of the lower lobes and posterior upper lobes. Slight decrease in size of pancreatic head mass. Marked hepatic steatosis. Slight decrease in size of nodule at the anterior surface LEFT lobe liver. CBD stent without biliary dilatation. Scattered bowel wall thickening, may be related to ascites, unable to exclude enteritis. Distal colonic diverticulosis without evidence of diverticulitis. LEFT adrenal myelolipoma. Aortic Atherosclerosis (ICD10-I70.0). Electronically Signed   By: Lavonia Dana M.D.   On: 05/29/2020 15:00   US Abdomen Limited  Result Date: 06/27/2020 CLINICAL DATA:  Hyperbilirubinemia assess for ascites EXAM: ULTRASOUND  ABDOMEN LIMITED RIGHT UPPER QUADRANT COMPARISON:  CT 05/29/2020 FINDINGS: Gallbladder: Surgically absent Common bile duct: Diameter: 5.6 mm Liver: Liver is echogenic. Coarse echotexture with slight contour nodularity, probable cirrhosis. Limited assessment for focal hepatic abnormality. Liver appears slightly shrunken. Portal vein is patent on color Doppler imaging with normal direction of blood flow towards the liver. Other: Moderate to large ascites throughout the abdomen. IMPRESSION: 1. Echogenic somewhat small appearing nodular liver suspicious for cirrhosis. 2. Moderate to large amount of ascites throughout the 4 quadrants. Electronically Signed   By: Donavan Foil M.D.   On: 06/27/2020 16:39   US Paracentesis  Result Date: 06/06/2020 INDICATION: Patient with history of  pancreatic cancer, spontaneous bacterial peritonitis, sepsis, recurrent ascites. Request made for diagnostic paracentesis. EXAM: ULTRASOUND GUIDED DIAGNOSTIC PARACENTESIS MEDICATIONS: 1% lidocaine to skin and subcutaneous tissue COMPLICATIONS: None immediate. PROCEDURE: Informed written consent was obtained from the patient after a discussion of the risks, benefits and alternatives to treatment. A timeout was performed prior to the initiation of the procedure. Initial ultrasound scanning demonstrates a moderate amount of ascites within the left mid to lower abdominal quadrant. The left mid to lower abdomen was prepped and draped in the usual sterile fashion. 1% lidocaine was used for local anesthesia. Following this, a 19 gauge, 10-cm, Yueh catheter was introduced. An ultrasound image was saved for documentation purposes. The paracentesis was performed. The catheter was removed and a dressing was applied. The patient tolerated the procedure well without immediate post procedural complication. FINDINGS: A total of approximately 150 cc of clear, yellow fluid was removed. Samples were sent to the laboratory as requested by the clinical team. IMPRESSION: Successful ultrasound-guided diagnostic paracentesis yielding 150 cc of peritoneal fluid. Read by: Rowe Robert, PA-C Electronically Signed   By: Markus Daft M.D.   On: 06/06/2020 15:39   US Paracentesis  Result Date: 06/02/2020 INDICATION: Patient with history of pancreatic cancer, spontaneous bacterial peritonitis, sepsis, recurrent ascites. Request made for diagnostic and therapeutic paracentesis up to 1 liter. EXAM: ULTRASOUND GUIDED DIAGNOSTIC AND THERAPEUTIC PARACENTESIS MEDICATIONS: 1% lidocaine to skin and subcutaneous tissue COMPLICATIONS: None immediate. PROCEDURE: Informed written consent was obtained from the patient via interpreter after a discussion of the risks, benefits and alternatives to treatment. A timeout was performed prior to the initiation of  the procedure. Initial ultrasound scanning demonstrates a small to moderate amount of ascites within the left upper to mid abdominal quadrant. The left upper to mid abdomen was prepped and draped in the usual sterile fashion. 1% lidocaine was used for local anesthesia. Following this, a 19 gauge, 10-cm, Yueh catheter was introduced. An ultrasound image was saved for documentation purposes. The paracentesis was performed. The catheter was removed and a dressing was applied. The patient tolerated the procedure well without immediate post procedural complication. FINDINGS: A total of approximately 1 liter of hazy, yellow fluid was removed. Samples were sent to the laboratory as requested by the clinical team. IMPRESSION: Successful ultrasound-guided diagnostic and therapeutic paracentesis yielding 1 liter of peritoneal fluid. Read by: Rowe Robert, PA-C Electronically Signed   By: Jacqulynn Cadet M.D.   On: 06/02/2020 16:36   DG Chest Port 1 View  Result Date: 05/29/2020 CLINICAL DATA:  Evaluate for sepsis. Shortness of breath and cough. History of pancreas cancer. EXAM: PORTABLE CHEST 1 VIEW COMPARISON:  CT chest 11/29/2019 FINDINGS: Right chest wall port a catheter identified with tip at the cavoatrial junction. Normal heart size. A small left pleural effusion is suspected. Additionally, there are patchy  airspace densities within the mid and lower left lung zone. Right lung appears clear. IMPRESSION: Left mid and lower lung zone airspace densities compatible with pneumonia. Suspect small left pleural effusion. Electronically Signed   By: Kerby Moors M.D.   On: 05/29/2020 10:34   ECHOCARDIOGRAM COMPLETE  Result Date: 06/06/2020    ECHOCARDIOGRAM REPORT   Patient Name:   AUBRI SJOBERG Date of Exam: 06/06/2020 Medical Rec #:  [id number]    Height:       64.0 in Accession #:    [id number]   Weight:       161.7 lb Date of Birth:  03-04-1946     BSA:          1.787 m Patient Age:    36 years     BP:            118/67 mmHg Patient Gender: F            HR:           79 bpm. Exam Location:  Inpatient Procedure: 2D Echo, Cardiac Doppler and Color Doppler Indications:    Study is to rule out right heart failure.; R06.02 SOB  History:        Patient has no prior history of Echocardiogram examinations.                 Abnormal ECG; Risk Factors:Diabetes and Hypertension. Pancreatic                 cancer. Ascites.  Sonographer:    Roseanna Rainbow RDCS Referring Phys: [id number] [place]  Sonographer Comments: Technically difficult study due to poor echo windows. IMPRESSIONS  1. Left ventricular ejection fraction, by estimation, is 65 to 70%. The left ventricle has normal function. The left ventricle has no regional wall motion abnormalities. There is mild left ventricular hypertrophy. Left ventricular diastolic parameters are consistent with Grade I diastolic dysfunction (impaired relaxation).  2. Right ventricular systolic function is normal. The right ventricular size is normal. Tricuspid regurgitation signal is inadequate for assessing PA pressure.  3. Moderate pleural effusion in the left lateral region.  4. The mitral valve is grossly normal. Trivial mitral valve regurgitation.  5. The aortic valve is tricuspid. Aortic valve regurgitation is not visualized. Comparison(s): No prior Echocardiogram. FINDINGS  Left Ventricle: Left ventricular ejection fraction, by estimation, is 65 to 70%. The left ventricle has normal function. The left ventricle has no regional wall motion abnormalities. The left ventricular internal cavity size was normal in size. There is  mild left ventricular hypertrophy. Left ventricular diastolic parameters are consistent with Grade I diastolic dysfunction (impaired relaxation). Normal left ventricular filling pressure. Right Ventricle: The right ventricular size is normal. No increase in right ventricular wall thickness. Right ventricular systolic function is normal. Tricuspid regurgitation  signal is inadequate for assessing PA pressure. Left Atrium: Left atrial size was normal in size. Right Atrium: Right atrial size was normal in size. Pericardium: There is no evidence of pericardial effusion. Mitral Valve: The mitral valve is grossly normal. Trivial mitral valve regurgitation. Tricuspid Valve: The tricuspid valve is grossly normal. Tricuspid valve regurgitation is trivial. Aortic Valve: The aortic valve is tricuspid. Aortic valve regurgitation is not visualized. Pulmonic Valve: The pulmonic valve was normal in structure. Pulmonic valve regurgitation is not visualized. Aorta: The aortic root and ascending aorta are structurally normal, with no evidence of dilitation. IAS/Shunts: No atrial level shunt detected by color flow Doppler. Additional Comments: There is a moderate  pleural effusion in the left lateral region. Mild ascites is present.  LEFT VENTRICLE PLAX 2D LVIDd:         3.70 cm     Diastology LVIDs:         2.30 cm     LV e' medial:    7.94 cm/s LV PW:         1.30 cm     LV E/e' medial:  7.8 LV IVS:        1.20 cm     LV e' lateral:   11.00 cm/s LVOT diam:     1.90 cm     LV E/e' lateral: 5.6 LV SV:         62 LV SV Index:   35 LVOT Area:     2.84 cm  LV Volumes (MOD) LV vol d, MOD A2C: 56.1 ml LV vol d, MOD A4C: 52.0 ml LV vol s, MOD A2C: 22.2 ml LV vol s, MOD A4C: 15.8 ml LV SV MOD A2C:     33.9 ml LV SV MOD A4C:     52.0 ml LV SV MOD BP:      35.7 ml RIGHT VENTRICLE TAPSE (M-mode): 1.5 cm LEFT ATRIUM             Index       RIGHT ATRIUM          Index LA diam:        2.50 cm 1.40 cm/m  RA Area:     7.37 cm LA Vol (A2C):   24.5 ml 13.71 ml/m RA Volume:   12.10 ml 6.77 ml/m LA Vol (A4C):   21.2 ml 11.86 ml/m LA Biplane Vol: 24.2 ml 13.54 ml/m  AORTIC VALVE LVOT Vmax:   113.00 cm/s LVOT Vmean:  73.700 cm/s LVOT VTI:    0.220 m  AORTA Ao Root diam: 3.40 cm Ao Asc diam:  3.30 cm MITRAL VALVE MV Area (PHT): 3.53 cm    SHUNTS MV Decel Time: 215 msec    Systemic VTI:  0.22 m MV E  velocity: 62.15 cm/s  Systemic Diam: 1.90 cm MV A velocity: 89.50 cm/s MV E/A ratio:  0.69 Lyman Bishop MD Electronically signed by Lyman Bishop MD Signature Date/Time: 06/06/2020/4:32:16 PM    Final     ASSESSMENT & PLAN:  75 yo female   1.  Worsening transaminitis and hyperbilirubinemia  2. AKI with hypotension secondary to intravascular dehydration 3. unresectable pancreatic cancer, status post chemo 4. recent history of SBP, treated  5. large volume ascites, possible related to pancreatic cancer or liver cirrhosis  6.  Anorexia and poor intake 7. DNR    Recommendations: -Ultrasound today showed nodular appearing liver suspicious for cirrhosis, no bile duct obstruction.  No clinical suspicion for cholangitis, liver failure possibility to have liver cirrhosis, versus liver metastasis (has not been proved) -I had lengthy discussion with patient and her daughter-in-law about the goal of care, which is palliative at this point.  She is not a candidate for chemotherapy or radiation.  Patient clearly indicated that she is not interested in more cancer treatment.  She is in agreement of comfort care. -She agreed to DNR -Paracentesis tomorrow for symptom relieve, will give albumin infusion tomorrow.  -We will give gentle IV fluids 500 cc tonight, to see if her blood pressure would improve -Continue oxycodone as needed, and supportive care -We will consult hospice tomorrow, to arrange home hospice versus residential hospice -We not repeat her vital signs  at night 10pm-8am  -SCD for DVT prophylaxis    All questions were answered. The patient knows to call the clinic with any problems, questions or concerns.      Truitt Merle, MD 06/27/2020 6:03 PM

## 2020-06-28 ENCOUNTER — Observation Stay (HOSPITAL_COMMUNITY): Payer: 59

## 2020-06-28 ENCOUNTER — Telehealth: Payer: Self-pay | Admitting: Hematology

## 2020-06-28 ENCOUNTER — Other Ambulatory Visit (HOSPITAL_COMMUNITY): Payer: 59

## 2020-06-28 DIAGNOSIS — R059 Cough, unspecified: Secondary | ICD-10-CM

## 2020-06-28 DIAGNOSIS — N179 Acute kidney failure, unspecified: Secondary | ICD-10-CM

## 2020-06-28 LAB — COMPREHENSIVE METABOLIC PANEL
ALT: 39 U/L (ref 0–44)
AST: 77 U/L — ABNORMAL HIGH (ref 15–41)
Albumin: 1.5 g/dL — ABNORMAL LOW (ref 3.5–5.0)
Alkaline Phosphatase: 242 U/L — ABNORMAL HIGH (ref 38–126)
Anion gap: 10 (ref 5–15)
BUN: 36 mg/dL — ABNORMAL HIGH (ref 8–23)
CO2: 21 mmol/L — ABNORMAL LOW (ref 22–32)
Calcium: 7.5 mg/dL — ABNORMAL LOW (ref 8.9–10.3)
Chloride: 107 mmol/L (ref 98–111)
Creatinine, Ser: 2.28 mg/dL — ABNORMAL HIGH (ref 0.44–1.00)
GFR, Estimated: 22 mL/min — ABNORMAL LOW (ref >60)
Glucose, Bld: 118 mg/dL — ABNORMAL HIGH (ref 70–99)
Potassium: 5.1 mmol/L (ref 3.5–5.1)
Sodium: 138 mmol/L (ref 135–145)
Total Bilirubin: 6 mg/dL — ABNORMAL HIGH (ref 0.3–1.2)
Total Protein: 5.3 g/dL — ABNORMAL LOW (ref 6.5–8.1)

## 2020-06-28 LAB — PROTIME-INR
INR: 4.5 (ref 0.8–1.2)
Prothrombin Time: 41.3 seconds — ABNORMAL HIGH (ref 11.4–15.2)

## 2020-06-28 LAB — CANCER ANTIGEN 19-9: CA 19-9: 263 U/mL — ABNORMAL HIGH (ref 0–35)

## 2020-06-28 LAB — APTT: aPTT: 72 seconds — ABNORMAL HIGH (ref 24–36)

## 2020-06-28 MED ORDER — VITAMIN K1 10 MG/ML IJ SOLN
10.0000 mg | Freq: Once | INTRAVENOUS | Status: AC
  Administered 2020-06-28: 10 mg via INTRAVENOUS
  Filled 2020-06-28: qty 1

## 2020-06-28 MED ORDER — SODIUM CHLORIDE 0.9 % IV SOLN: INTRAVENOUS | Status: AC

## 2020-06-28 MED ORDER — LIDOCAINE HCL 1 % IJ SOLN
INTRAMUSCULAR | Status: AC
  Filled 2020-06-28: qty 20

## 2020-06-28 MED ORDER — ALBUMIN HUMAN 25 % IV SOLN
12.5000 g | Freq: Once | INTRAVENOUS | Status: AC
  Administered 2020-06-28: 12.5 g via INTRAVENOUS
  Filled 2020-06-28: qty 50

## 2020-06-28 NOTE — Progress Notes (Signed)
    Durable Medical Equipment  (From admission, onward)         Start     Ordered   06/28/20 1348  For home use only DME Hospital bed  Once       Question Answer Comment  Length of Need Lifetime   Patient has (list medical condition): pancreatic cancer   The above medical condition requires: Patient requires the ability to reposition frequently   Head must be elevated greater than: 30 degrees   Bed type Semi-electric      06/28/20 1348   06/28/20 1347  For home use only DME lightweight manual wheelchair with seat cushion  Once       Comments: Patient suffers from  pancreatic cancer  which impairs their ability to perform daily activities like walking in the home.  A walker will not resolve  issue with performing activities of daily living. A wheelchair will allow patient to safely perform daily activities. Patient is not able to propel themselves in the home using a standard weight wheelchair due to weakness. Patient can self propel in the lightweight wheelchair. Length of need lifetime Accessories: elevating leg rests (ELRs), wheel locks, extensions and anti-tippers.   06/28/20 1348

## 2020-06-28 NOTE — Progress Notes (Signed)
[facility])  Referral received for outpatient palliative care services.  ACC will follow up with patient and her family in the home once she d/c's.  Venia Carbon RN, BSN, [place] [facility] Liaison

## 2020-06-28 NOTE — TOC Initial Note (Signed)
Transition of Care [facility]) - Initial/Assessment Note    Patient Details  Name: Tamara Wood MRN: [id number] Date of Birth: 1945/10/27  Transition of Care [facility]) CM/SW Contact:    Lynnell Catalan, RN Phone Number: 06/28/2020, 2:50 PM  Clinical Narrative:                 Spoke with pt and DIL [name] at length at bedside. [name] states that pt states she is not quite ready for hospice and would like to try with HHPT for a short time first at home. Choice offered for [facility] services and [facility] chosen. [facility] liaison contacted for referral. Outpatient palliative referral also made to [facility] to be in the home when pt is ready to transition to full hospice services. [name] requests hospital bed and wheelchair for home. Orders received and DME narrative placed in progress notes. [employer] contacted for DME. Will need MD orders for HHPT/OT/Aide at dc. Also awaiting desat screen to be done by nursing to see if pt needs home 02  Expected Discharge Plan: [place] Barriers to Discharge: Continued Medical Work up   Patient Goals and CMS Choice Patient states their goals for this hospitalization and ongoing recovery are:: to go home CMS Medicare.gov Compare Post Acute Care list provided to:: Patient Represenative (must comment) (Daughter in [place]) Choice offered to / list presented to : Adult Children  Expected Discharge Plan and Services Expected Discharge Plan: [place]   Discharge Planning Services: CM Consult Post Acute Care Choice: [place] arrangements for the past 2 months: [place]                 DME Arranged: Wheelchair manual,Hospital bed DME Agency: [employer] Date DME Agency Contacted: 06/28/20 Time DME Agency Contacted: [phone] Representative spoke with at DME Agency: [place]: PT,OT,[facility]urse[facility] [place]: [place] and [place] (Outpatient palliative) Date HH Agency  Contacted: 06/28/20 Time [place]: [place] Representative spoke with at [place]: Tommi Rumps  Prior Living Arrangements/Services Living arrangements for the past 2 months: [place] Lives with:: Relatives Patient language and need for interpreter reviewed:: Yes Do you feel safe going back to the place where you live?: Yes      Need for Family Participation in Patient Care: Yes (Comment) Care giver support system in place?: Yes (comment)   Criminal Activity/Legal Involvement Pertinent to Current Situation/Hospitalization: No - Comment as needed  Activities of Daily Living Home Assistive Devices/Equipment: None,Walker (specify type) ADL Screening (condition at time of admission) Patient's cognitive ability adequate to safely complete daily activities?: Yes Is the patient deaf or have difficulty hearing?: No Does the patient have difficulty seeing, even when wearing glasses/contacts?: Yes Does the patient have difficulty concentrating, remembering, or making decisions?: Yes Patient able to express need for assistance with ADLs?: Yes Does the patient have difficulty dressing or bathing?: Yes Independently performs ADLs?: No Communication: Needs assistance Is this a change from baseline?: Change from baseline, expected to last >3 days Grooming: Needs assistance Is this a change from baseline?: Change from baseline, expected to last >3 days Feeding: Needs assistance Is this a change from baseline?: Change from baseline, expected to last >3 days Bathing: Needs assistance Is this a change from baseline?: Change from baseline, expected to last >3 days Toileting: Needs assistance Is this a change from baseline?: Change from baseline, expected to last >3days In/Out Bed: Dependent Is this a change  from baseline?: Change from baseline, expected to last >3 days Does the patient have difficulty walking or climbing stairs?: Yes Weakness of Legs: Left Weakness of Arms/Hands:  Both  Permission Sought/Granted Permission sought to share information with : Chartered certified accountant granted to share information with : Yes, Verbal Permission Granted     Permission granted to share info w AGENCY: [facility]        Emotional Assessment Appearance:: Appears stated age Attitude/Demeanor/Rapport: Gracious Affect (typically observed): Calm Orientation: : Oriented to Self,Oriented to Place,Oriented to  Time,Oriented to Situation Alcohol / Substance Use: Not Applicable Psych Involvement: No (comment)  Admission diagnosis:  Liver failure ([place]) [K72.90] Patient Active Problem List   Diagnosis Date Noted  . Cough   . Liver failure ([place]) 06/27/2020  . AKI (acute kidney injury) ([place]) 06/27/2020  . Elevated INR   . Liver failure without hepatic coma ([place])   . Abdominal pain   . SBP (spontaneous bacterial peritonitis) ([place]) 05/30/2020  . Hypoglycemia without diagnosis of diabetes mellitus 05/30/2020  . Pressure injury of skin 05/30/2020  . Ascites 05/29/2020  . Sepsis ([place]) 05/29/2020  . Acute lower UTI 05/29/2020  . Elevated LFTs 05/29/2020  . Thrombocytopenia ([place]) 05/29/2020  . Lactic acidosis   . Port-A-Cath in place 12/16/2019  . Goals of care, counseling/discussion 11/30/2019  . Hypertension 11/30/2019  . DM (diabetes mellitus), type 2 ([place]) 11/30/2019  . Pancreatic cancer ([place]) 11/21/2019   PCP:  Isaac Bliss, Rayford Halsted, MD Pharmacy:   [id number] [address], [place] [address] Phone: [phone] Fax: [phone]     Social Determinants of Health (SDOH) Interventions    Readmission Risk Interventions Readmission Risk Prevention Plan 06/06/2020  Transportation Screening Complete  PCP or Specialist Appt within 3-5 Days Complete  HRI or [place] Complete  Social Work Consult for [place] Planning/Counseling Complete  Palliative Care Screening Not Applicable   Medication Review Press photographer) Complete  Some recent data might be hidden

## 2020-06-28 NOTE — Procedures (Signed)
Ultrasound-guided  therapeutic paracentesis performed yielding 4.8 liters of yellow fluid. No immediate complications. EBL none.   

## 2020-06-28 NOTE — Telephone Encounter (Signed)
No 1/11 los. No changes made to pt's schedule.  

## 2020-06-28 NOTE — Progress Notes (Signed)
Tamara Wood   DOB:May 02, 1946   DP#:[id number]   [id number]  Oncology follow up note  Subjective: Pt slept well last night, still has dry cough, no significant pain, no other new complains.    Objective:  Vitals:   06/28/20 0755  BP: 102/60  Pulse: 87  Resp: 20  Temp: 98 F (36.7 C)  SpO2: 98%    There is no height or weight on file to calculate BMI.  Intake/Output Summary (Last 24 hours) at 06/28/2020 0756 Last data filed at 06/27/2020 1800 Gross per 24 hour  Intake 0 ml  Output --  Net 0 ml     Sclerae icteric  Oropharynx clear  No peripheral adenopathy  Lungs clear -- no rales or rhonchi  Heart regular rate and rhythm  Abdomen distended and mild diffuse tenderness   MSK no focal spinal tenderness, (+) b/l leg edema up to knee   Neuro nonfocal   CBG (last 3)  No results for input(s): GLUCAP in the last 72 hours.   Labs:  Urine Studies No results for input(s): UHGB, CRYS in the last 72 hours.  Invalid input(s): UACOL, UAPR, USPG, UPH, UTP, UGL, UKET, UBIL, UNIT, UROB, ULEU, UEPI, UWBC, URBC, UBAC, CAST, UCOM, BILUA  Basic Metabolic Panel: Recent Labs  Lab 06/27/20 1225  NA 137  K 5.4*  CL 108  CO2 20*  GLUCOSE 104*  BUN 32*  CREATININE 2.11*  CALCIUM 7.8*   GFR CrCl cannot be calculated (Unknown ideal weight.). Liver Function Tests: Recent Labs  Lab 06/27/20 1225  AST 92*  ALT 49*  ALKPHOS 366*  BILITOT 7.4*  PROT 7.0  ALBUMIN 1.8*   No results for input(s): LIPASE, AMYLASE in the last 168 hours. No results for input(s): AMMONIA in the last 168 hours. Coagulation profile No results for input(s): INR, PROTIME in the last 168 hours.  CBC: Recent Labs  Lab 06/27/20 1225  WBC 4.2  NEUTROABS 3.2  HGB 11.3*  HCT 34.5*  MCV 109.2*  PLT 78*   Cardiac Enzymes: No results for input(s): CKTOTAL, CKMB, CKMBINDEX, TROPONINI in the last 168 hours. BNP: Invalid input(s): POCBNP CBG: No results for input(s): GLUCAP in the last 168  hours. D-Dimer No results for input(s): DDIMER in the last 72 hours. Hgb A1c No results for input(s): HGBA1C in the last 72 hours. Lipid Profile No results for input(s): CHOL, HDL, LDLCALC, TRIG, CHOLHDL, LDLDIRECT in the last 72 hours. Thyroid function studies No results for input(s): TSH, T4TOTAL, T3FREE, THYROIDAB in the last 72 hours.  Invalid input(s): FREET3 Anemia work up No results for input(s): VITAMINB12, FOLATE, FERRITIN, TIBC, IRON, RETICCTPCT in the last 72 hours. Microbiology No results found for this or any previous visit (from the past 240 hour(s)).    Studies:  US Abdomen Limited  Result Date: 06/27/2020 CLINICAL DATA:  Hyperbilirubinemia assess for ascites EXAM: ULTRASOUND ABDOMEN LIMITED RIGHT UPPER QUADRANT COMPARISON:  CT 05/29/2020 FINDINGS: Gallbladder: Surgically absent Common bile duct: Diameter: 5.6 mm Liver: Liver is echogenic. Coarse echotexture with slight contour nodularity, probable cirrhosis. Limited assessment for focal hepatic abnormality. Liver appears slightly shrunken. Portal vein is patent on color Doppler imaging with normal direction of blood flow towards the liver. Other: Moderate to large ascites throughout the abdomen. IMPRESSION: 1. Echogenic somewhat small appearing nodular liver suspicious for cirrhosis. 2. Moderate to large amount of ascites throughout the 4 quadrants. Electronically Signed   By: Donavan Foil M.D.   On: 06/27/2020 16:39    Assessment: 75 y.o.  female   1.  Worsening transaminitis and hyperbilirubinemia  2. AKI with hypotension secondary to intravascular dehydration 3. unresectable pancreatic cancer, status post chemo 4. recent history of SBP, treated  5. large volume ascites 6.  Anorexia and poor intake 7. DNR     Plan:  -paracentesis today with albumin infusion after, if her BP good this afternoon, may give gentle diuretics after albumin infusion  -CXR for cough, continue cough syrup -will discuss with case  manager today to arrange home hospice, pt would like to go home  -dietician consult pending  -continue supportive care      Truitt Merle, MD 06/28/2020  7:56 AM

## 2020-06-28 NOTE — Progress Notes (Signed)
Oncology follow up  I saw pt and her daughter-in-law this afternoon to discuss discharge plan. We discussed the option of residential hospice, home hospice, palliative care along with regular home care. Pt's strong wish is going home to spend time with them (her relatives are out of state to visit her) and she would like to try PT at home. [name] think she will transition to hospice at some point at home. CM Alinda Sierras has spoken with them and is helping to set up her discharge equipments.   She underwent large volume paracentesis with 5L removed. Will give albumin infusion.  She is [place] witnessed, but agrees with albumin infusion.   Will also give gentle hydration with 514ml NS.   Plan to d/c home tomorrow   Truitt Merle  06/28/2020

## 2020-06-29 ENCOUNTER — Encounter (HOSPITAL_COMMUNITY): Payer: Self-pay | Admitting: Hematology

## 2020-06-29 DIAGNOSIS — E119 Type 2 diabetes mellitus without complications: Secondary | ICD-10-CM | POA: Diagnosis present

## 2020-06-29 DIAGNOSIS — C250 Malignant neoplasm of head of pancreas: Secondary | ICD-10-CM

## 2020-06-29 DIAGNOSIS — Z515 Encounter for palliative care: Secondary | ICD-10-CM | POA: Diagnosis not present

## 2020-06-29 DIAGNOSIS — K7200 Acute and subacute hepatic failure without coma: Secondary | ICD-10-CM | POA: Diagnosis not present

## 2020-06-29 DIAGNOSIS — E860 Dehydration: Secondary | ICD-10-CM | POA: Diagnosis present

## 2020-06-29 DIAGNOSIS — C259 Malignant neoplasm of pancreas, unspecified: Secondary | ICD-10-CM | POA: Diagnosis present

## 2020-06-29 DIAGNOSIS — Z9221 Personal history of antineoplastic chemotherapy: Secondary | ICD-10-CM | POA: Diagnosis not present

## 2020-06-29 DIAGNOSIS — N179 Acute kidney failure, unspecified: Secondary | ICD-10-CM | POA: Diagnosis present

## 2020-06-29 DIAGNOSIS — I959 Hypotension, unspecified: Secondary | ICD-10-CM | POA: Diagnosis present

## 2020-06-29 DIAGNOSIS — I10 Essential (primary) hypertension: Secondary | ICD-10-CM | POA: Diagnosis present

## 2020-06-29 DIAGNOSIS — Z66 Do not resuscitate: Secondary | ICD-10-CM | POA: Diagnosis present

## 2020-06-29 DIAGNOSIS — Z9884 Bariatric surgery status: Secondary | ICD-10-CM | POA: Diagnosis not present

## 2020-06-29 DIAGNOSIS — R188 Other ascites: Secondary | ICD-10-CM | POA: Diagnosis present

## 2020-06-29 DIAGNOSIS — K7290 Hepatic failure, unspecified without coma: Secondary | ICD-10-CM | POA: Diagnosis present

## 2020-06-29 DIAGNOSIS — Z20822 Contact with and (suspected) exposure to covid-19: Secondary | ICD-10-CM | POA: Diagnosis present

## 2020-06-29 LAB — SARS CORONAVIRUS 2 (TAT 6-24 HRS): SARS Coronavirus 2: NEGATIVE

## 2020-06-29 MED ORDER — SENNOSIDES-DOCUSATE SODIUM 8.6-50 MG PO TABS: 1.0000 | ORAL_TABLET | Freq: Every evening | ORAL | Status: AC | PRN

## 2020-06-29 MED ORDER — GUAIFENESIN-DM 100-10 MG/5ML PO SYRP: 10.0000 mL | ORAL_SOLUTION | ORAL | 0 refills | Status: AC | PRN

## 2020-06-29 MED ORDER — ONDANSETRON 4 MG PO TBDP: 4.0000 mg | ORAL_TABLET | Freq: Three times a day (TID) | ORAL | 0 refills | Status: AC | PRN

## 2020-06-29 MED ORDER — OXYCODONE HCL 5 MG PO TABS: 5.0000 mg | ORAL_TABLET | ORAL | 0 refills | Status: AC | PRN

## 2020-06-29 MED ORDER — ALUM & MAG HYDROXIDE-SIMETH 200-200-20 MG/5ML PO SUSP: 60.0000 mL | ORAL | 0 refills | Status: AC | PRN

## 2020-06-29 NOTE — Progress Notes (Signed)
Patient discharged to home via PT[facility]. Discharge instructions reviewed with patients DIL [name] who verbalized understanding. New RX given to [name].

## 2020-06-29 NOTE — Progress Notes (Signed)
Chaplain engaged in initial visit with Rashon, who was sleeping, and her daughter-in-law.  Chaplain offered support.  Daughter shared that they are Jehovah Witnesses and have spiritual support. Chaplain also learned that Taleeya is the mother of six children, all living in the area except one.  She is also a grandmother and great-mother.  Chaplain affirmed the support and love she is surrounded by.  Daughter noted that Monice has been doing good.  They have a plan in place as she discharges today.  Chaplain available to provide support as needed.    06/29/20 1100  Clinical Encounter Type  Visited With Patient and family together  Visit Type Initial

## 2020-06-29 NOTE — TOC Transition Note (Signed)
Transition of Care [facility]) - CM/SW Discharge Note   Patient Details  Name: Tamara Wood MRN: [id number] Date of Birth: 08/10/1945  Transition [facility] Care [facility]) CM/SW Contact:  Lynnell Catalan, RN Phone Number: 06/29/2020, 2:19 PM   Clinical Narrative:    Pt to dc home today with [facility] and Palliative services. DIL [name] has reported that DME has arrived to the home. Address confirmed with [name].  PT[facility] contacted for transport. Yellow DNR on chart for DC.   Final next level of care: [place] Barriers to Discharge: Continued Medical Work up   Patient Goals and CMS Choice Patient states their goals for this hospitalization and ongoing recovery are:: to go home CMS Medicare.gov Compare Post Acute Care list provided to:: Patient Represenative (must comment) (Daughter in [place]) Choice offered to / list presented to : Adult Children  Discharge Plan and Services   Discharge Planning Services: CM Consult Post Acute Care Choice: Home Health          DME Arranged: Wheelchair manual,Hospital bed DME Agency: [employer] Date DME Agency Contacted: 06/28/20 Time DME Agency Contacted: [phone] Representative spoke with at DME Agency: [place]: PT,OT,[facility] [place]: [place] (Outpatient palliative) Date HH Agency Contacted: 06/28/20 Time [place]: [phone] Representative spoke with at [place]: [place] ([place]) Interventions     Readmission Risk Interventions Readmission Risk Prevention Plan 06/06/2020  Transportation Screening Complete  PCP or Specialist Appt within 3-5 Days Complete  HRI or [place] Complete  Social Work Consult for [place] Planning/Counseling Complete  Palliative Care Screening Not Applicable  Medication Review Press photographer) Complete  Some recent data might be hidden

## 2020-06-29 NOTE — Discharge Summary (Addendum)
Discharge Summary  Patient ID: Tamara Wood MRN: [id number] DOB/AGE: 1946-03-18 75 y.o.  Admit date: 06/27/2020 Discharge date: 06/29/2020  Discharge Diagnoses:  Active Problems:   Pancreatic cancer ([place])   Liver failure ([facility])   AKI (acute kidney injury) ([place])   Cough   Palliative care patient   Discharged Condition: fair  Discharge Labs:   CBC    Component Value Date/Time   WBC 4.2 06/27/2020 1225   WBC 9.0 06/08/2020 0500   RBC 3.16 (L) 06/27/2020 1225   HGB 11.3 (L) 06/27/2020 1225   HCT 34.5 (L) 06/27/2020 1225   PLT 78 (L) 06/27/2020 1225   MCV 109.2 (H) 06/27/2020 1225   MCH 35.8 (H) 06/27/2020 1225   MCHC 32.8 06/27/2020 1225   RDW 22.2 (H) 06/27/2020 1225   LYMPHSABS 0.7 06/27/2020 1225   MONOABS 0.2 06/27/2020 1225   EOSABS 0.0 06/27/2020 1225   BASOSABS 0.0 06/27/2020 1225   CMP Latest Ref Rng & Units 06/28/2020 06/27/2020 06/08/2020  Glucose 70 - 99 mg/dL 118(H) 104(H) 126(H)  BUN 8 - 23 mg/dL 36(H) 32(H) 20  Creatinine 0.44 - 1.00 mg/dL 2.28(H) 2.11(H) 0.64  Sodium 135 - 145 mmol/L 138 137 140  Potassium 3.5 - 5.1 mmol/L 5.1 5.4(H) 4.0  Chloride 98 - 111 mmol/L 107 108 107  CO2 22 - 32 mmol/L 21(L) 20(L) 25  Calcium 8.9 - 10.3 mg/dL 7.5(L) 7.8(L) 7.5(L)  Total Protein 6.5 - 8.1 g/dL 5.3(L) 7.0 4.8(L)  Total Bilirubin 0.3 - 1.2 mg/dL 6.0(H) 7.4(HH) 2.4(H)  Alkaline Phos 38 - 126 U/L 242(H) 366(H) 252(H)  AST 15 - 41 U/L 77(H) 92(H) 48(H)  ALT 0 - 44 U/L 39 49(H) 23    Consults: None  Procedures: 06/28/2020-ultrasound-guided paracentesis with 4.8 L of fluid removed.  Disposition: Discharge disposition: 06-Home-Health Care Svc        Allergies as of 06/29/2020      Reactions   Shellfish Allergy Other (See Comments)   Upset stomach      Medication List    STOP taking these medications   furosemide 40 MG tablet Commonly known as: LASIX   Klor-Con M20 20 MEQ tablet Generic drug: potassium chloride SA   Magnesium 250 MG Tabs   metoprolol  tartrate 25 MG tablet Commonly known as: LOPRESSOR   ondansetron 8 MG tablet Commonly known as: ZOFRAN   sulfamethoxazole-trimethoprim 800-160 MG tablet Commonly known as: BACTRIM DS     TAKE these medications   acetaminophen 500 MG tablet Commonly known as: TYLENOL Take 1,000 mg by mouth daily as needed for moderate pain.   alum & mag hydroxide-simeth 200-200-20 MG/5ML suspension Commonly known as: MAALOX/MYLANTA Take 60 mLs by mouth every 4 (four) hours as needed for indigestion.   Calcium/Vitamin D 500-200 MG-UNIT Tabs Take 1 tablet by mouth in the morning and at bedtime.   feeding supplement (GLUCERNA SHAKE) Liqd Take 237 mLs by mouth in the morning, at noon, and at bedtime.   feeding supplement (PRO-STAT SUGAR FREE 64) Liqd Take 30 mLs by mouth in the morning and at bedtime.   glucose blood test strip 1 each by Other route in the morning and at bedtime. Dx E11.9  For OneTouch Ultra 2   guaiFENesin 100 MG/5ML liquid Commonly known as: ROBITUSSIN Take 200 mg by mouth every 4 (four) hours as needed for cough.   guaiFENesin-dextromethorphan 100-10 MG/5ML syrup Commonly known as: ROBITUSSIN DM Take 10 mLs by mouth every 4 (four) hours as needed for cough.  hydrocortisone 2.5 % rectal cream Commonly known as: Anusol-HC Place 1 application rectally 2 (two) times daily as needed for anal itching.   lidocaine-prilocaine cream Commonly known as: EMLA Apply 1 application topically daily as needed (pain).   loperamide 2 MG capsule Commonly known as: IMODIUM Take 1 capsule (2 mg total) by mouth 2 (two) times daily.   metFORMIN 1000 MG tablet Commonly known as: GLUCOPHAGE Take 1 tablet (1,000 mg total) by mouth daily.   ondansetron 4 MG disintegrating tablet Commonly known as: ZOFRAN-ODT Take 1-2 tablets (4-8 mg total) by mouth every 8 (eight) hours as needed for nausea (not responsive to prochlorperazine (COMPAZINE)).   OneTouch Delica Lancets 99991111 Misc Use twice  daily for glucose control, Dx E11.9   oxyCODONE 5 MG immediate release tablet Commonly known as: Oxy IR/ROXICODONE Take 1 tablet (5 mg total) by mouth every 4 (four) hours as needed for moderate pain.   OXYGEN Inhale 3 L into the lungs continuous.   pantoprazole 20 MG tablet Commonly known as: PROTONIX TAKE 1 TABLET BY MOUTH EVERY DAY   prochlorperazine 10 MG tablet Commonly known as: COMPAZINE Take 1 tablet (10 mg total) by mouth every 6 (six) hours as needed (Nausea or vomiting). What changed: reasons to take this   Santyl ointment Generic drug: collagenase Apply 1 application topically daily. Apply on sacral wound   senna-docusate 8.6-50 MG tablet Commonly known as: Senokot-S Take 1 tablet by mouth at bedtime as needed for mild constipation.   vitamin B-12 1000 MCG tablet Commonly known as: CYANOCOBALAMIN Take 1 tablet (1,000 mcg total) by mouth daily.            Durable Medical Equipment  (From admission, onward)         Start     Ordered   06/28/20 1348  For home use only DME Hospital bed  Once       Question Answer Comment  Length of Need Lifetime   Patient has (list medical condition): pancreatic cancer   The above medical condition requires: Patient requires the ability to reposition frequently   Head must be elevated greater than: 30 degrees   Bed type Semi-electric      06/28/20 1348   06/28/20 1347  For home use only DME lightweight manual wheelchair with seat cushion  Once       Comments: Patient suffers from  pancreatic cancer  which impairs their ability to perform daily activities like walking in the home.  A walker will not resolve  issue with performing activities of daily living. A wheelchair will allow patient to safely perform daily activities. Patient is not able to propel themselves in the home using a standard weight wheelchair due to weakness. Patient can self propel in the lightweight wheelchair. Length of need lifetime Accessories:  elevating leg rests (ELRs), wheel locks, extensions and anti-tippers.   06/28/20 1348           HPI:  Ms Tamara Wood is a 75 yo Spanish-speaking female, with history of resectable pancreatic cancer, previously on chemotherapy, recently hospitalized for SBP from May 29, 2020 to June 08, 2020, currently a resident at [facility], is being admitted from a clinic to vascular hospital for worsening liver failure, ascites, and hypotension.   Since her recent hospital discharge 3 weeks ago, she has been getting limited to physical therapy every day nursing home.  She is very fatigued, not able to walk independently, and spends most time in bed.  Appetite has been low, eating  small meals.  She has noticed worsening abdominal distention, bilateral lower extremity edema in the past 2 weeks.  No fever or chills, no worsening abdominal pain.  She woke up this morning, feels extremely fatigued, but did not come to my scheduled office visit by facility transportation.  Her daughter-in-law also presented to the clinic with her, and served as interpreter.  Her lab today showed significant worsening liver functions, with total bilirubin 7.4, creatinine 2.11, from normal previously 3 weeks ago.  Ultrasound of the abdomen showed no bile duct obstruction, large volume ascites.  She was borderline hypotensive when she was in my clinic, received 500 cc normal saline, blood pressure did not improve.  She is awake, alert, oriented.  She is being admitted for further management in the hospital, and transition to hospice.  Hospital Course:  The patient was admitted to the hospital for symptom management and to expedite transition to home with hospice.  He underwent ultrasound-guided paracentesis this admission with 4.8 L of fluid removed.  She had improvement of her symptoms.  After discussions with the patient and the family, they are agreeable to discharge to home with home health and palliative care to follow her in the home.   The patient will have an aide and therapy services by home health.  Anticipate that she will transition to hospice services in the near future.  The patient was seen on the morning of 06/29/2020 and was stable for discharge.  She will discharge to home today.  Prescriptions have been completed for the patient.  An out of facility DNR was signed for the patient to take home with her.   Discharge Instructions    Activity as tolerated - No restrictions   Complete by: As directed    Diet - low sodium heart healthy   Complete by: As directed    No wound care   Complete by: As directed       Signed: Mikey Bussing 06/29/2020, 12:50 PM   I have seen the patient, examined her. I agree with the assessment and and plan and have edited the notes.   I spoke with pt and her daughter-in-law about discharge medications, and will hold on her BP and metformin for now due to her low BP and oral intake, but she will check her glucose intermittently.she is going home with palliative care service, per her wishes. I will set up a virtual visit with her in 1-2 weeks. Her daughter-in-law knows to call us if she is ready to switch to hospice service.   Truitt Merle  06/29/2020

## 2020-06-30 ENCOUNTER — Telehealth: Payer: Self-pay | Admitting: Hematology

## 2020-06-30 NOTE — Telephone Encounter (Signed)
Scheduled follow-up appointment per 1/13 schedule message. Patient's daughter-in-law is aware.

## 2020-07-04 ENCOUNTER — Telehealth: Payer: Self-pay

## 2020-07-04 NOTE — Telephone Encounter (Signed)
*  [phone] [name]*   Spoke with patient's DIL [name] and scheduled an in-person Palliative Consult for 07/05/20 @ 3PM  COVID screening was negative. No pets in home. Patient lives with DIL and patient's sisters are visiting and helping with care.  Consent obtained; updated Outlook/Netsmart/Team List and Epic.  Family is aware they will be receiving a call from NP the day before or day of to confirm appointment.

## 2020-07-05 ENCOUNTER — Telehealth: Payer: Self-pay | Admitting: Internal Medicine

## 2020-07-05 ENCOUNTER — Other Ambulatory Visit: Payer: Self-pay

## 2020-07-05 ENCOUNTER — Other Ambulatory Visit: Payer: 59 | Admitting: Nurse Practitioner

## 2020-07-05 DIAGNOSIS — C259 Malignant neoplasm of pancreas, unspecified: Secondary | ICD-10-CM

## 2020-07-05 DIAGNOSIS — Z515 Encounter for palliative care: Secondary | ICD-10-CM

## 2020-07-05 NOTE — Telephone Encounter (Signed)
Tamara Wood from [facility] is calling to get orders for Hospice.  She states the patient is dying and the family has decided they would like hospice care for her.  Orders can be sent to fax number: [phone]  Please advise.

## 2020-07-05 NOTE — Progress Notes (Addendum)
Tamara Wood Consult Note Telephone: [phone]  Fax: [phone]  PATIENT NAME: Tamara Wood [place] [id number] [phone] (home)  DOB: [birth year] MRN: [id number]  PRIMARY CARE PROVIDER:    Isaac Wood, Tamara Halsted, MD,  [place] [address] [phone]  REFERRING PROVIDER:   Isaac Wood, Tamara Halsted, MD [address] [place],  [place] [address] [phone]  RESPONSIBLE PARTY:   Extended Emergency Contact Information Primary Emergency Contact: Tamara Wood, Tamara Wood [address] [place] Phone: [phone] Mobile Phone: [phone] Relation: Relative Preferred language: English Interpreter needed? No Secondary Emergency Contact: Tamara Wood, Tamara Wood [place], [place] [address] [place] Phone: [phone] Mobile Phone: [phone] Relation: Son  I met face to face with patient and family in home.  ASSESSMENT AND RECOMMENDATIONS:   Advance Care Planning: Goal of care: Patient's goal of care is comfort. Patient desires to die at home.  Directives: Patient's code status is Do Not Resuscitate. Signed DNR form in home and on [facility] EMR. Patient not currently receiving treatment for her diease, family report patient is tired of being sick and not wanting anything to prolong her condition. Home hospice services and philosophy was discussed and explained in detail. Family expressed interest in home hospice services. Patient referred for Hospice services.  Symptom Management:  Pain and restless: Family report patient complaints of pain. Report patient restless and having difficulty finding comfortable position. Report patient symptoms worsened in the last two days. Report occasional complaints of SOB. No report of fever or chills. Patient denied nausea or vomiting. Patient endorsed pain as moderate today. Patient on  Oxycodone 57m as needed every 4hrs for pain. Family report patient having difficulty swallowing pills.  Recommendation: Advised to hold Oxycodone. Order placed for Morphine Sulfate 1022m5ml, patient to take 0.2530m5mg55mvery 4hrs as needed for pain or SOB pending Hospice admission. Provided general support and encouragement, no other unmet needs identified at this time.  Family /Caregiver/Community Supports: Patient lives at home with her son and his wife. [name] Tamara Wood main care giver and interprets for her in [place] / Functional decline: Patient is lethargic, oriented to self, total assist with all ADLs, non ambulatory. Poor oral intake.   I spent 60 minutes providing this consultation, time includes time spent with patient and family, chart review, provider coordination, and documentation. More than 50% of the time in this consultation was spent counseling and coordinating communication.   CHIEF COMPLAINT: Pain and restlessness  History obtained from review of EMR, discussion with primary team, and  interview with family. Records reviewed and summarized bellow.  HISTORY OF PRESENT ILLNESS:  HugaAriza Evansa 74 y35. year old female with multiple medical problems including Pancreatic cancer and liver failure. Patient was recently hospitalized from 05/29/2020 to 06/08/20 for worsening liver failure, ascities and hypotension. She underwent paracentesis during the admission, 4.5L of fluid was removed. Treatment for her cancer was discontinued in December 2021 due to poor response, patient also reported to not be interested in any more treatment. Palliative care was asked to follow this patient by consultation request of Tamara Wood help address advance care planning and complex decision making. This is a follow up visit.  CODE STATUS: DNR  PPS: 20%  HOSPICE ELIGIBILITY/DIAGNOSIS: yes/Pancreatic cancer, no longer on  treatment  PHYSICAL EXAM / ROS:    General: chronically ill and frail appearing, lying in bed in her room appeared uncomfortable Cardiovascular: denied chest pain, +3 generalized edema  Pulmonary: congested cough, no increased SOB noted, room air GI:  appetite poor, no report of constipation, incontinent of bowel, abdomen distended GU: denies dysuria, incontinent of urine MSK:  no joint and ROM abnormalities, non-ambulatory Skin: no rashes or wounds reported, jaundiced Neurological: Weakness, but otherwise nonfocal Psych: non-anxious affect  PAST MEDICAL HISTORY:  Past Medical History:  Diagnosis Date   Diabetes mellitus without complication ([place])    Hypertension    Pancreatic cancer ([place])     SOCIAL HX:  Social History   Tobacco Use   Smoking status: Never Smoker   Smokeless tobacco: Never Used  Substance Use Topics   Alcohol use: Never   FAMILY HX:  Family History  Problem Relation Age of Onset   Diabetes Mother    Diabetes Sister    Diabetes Brother     ALLERGIES:  Allergies  Allergen Reactions   Shellfish Allergy Other (See Comments)    Upset stomach     PERTINENT MEDICATIONS:  Outpatient Encounter Medications as of 07/05/2020  Medication Sig   acetaminophen (TYLENOL) 500 MG tablet Take 1,000 mg by mouth daily as needed for moderate pain.   alum & mag hydroxide-simeth (MAALOX/MYLANTA) 200-200-20 MG/5ML suspension Take 60 mLs by mouth every 4 (four) hours as needed for indigestion.   Amino Acids-Protein Hydrolys (FEEDING SUPPLEMENT, PRO-STAT SUGAR FREE 64,) LIQD Take 30 mLs by mouth in the morning and at bedtime.   Calcium Carb-Cholecalciferol (CALCIUM/VITAMIN D) 500-200 MG-UNIT TABS Take 1 tablet by mouth in the morning and at bedtime.    collagenase (SANTYL) ointment Apply 1 application topically daily. Apply on sacral wound   feeding supplement, GLUCERNA SHAKE, (GLUCERNA SHAKE) LIQD Take 237 mLs by mouth in the morning, at noon, and at bedtime.   glucose blood test strip 1  each by Other route in the morning and at bedtime. Dx E11.9  For OneTouch Ultra 2   guaiFENesin (ROBITUSSIN) 100 MG/5ML liquid Take 200 mg by mouth every 4 (four) hours as needed for cough.   guaiFENesin-dextromethorphan (ROBITUSSIN DM) 100-10 MG/5ML syrup Take 10 mLs by mouth every 4 (four) hours as needed for cough.   hydrocortisone (ANUSOL-HC) 2.5 % rectal cream Place 1 application rectally 2 (two) times daily as needed for anal itching.   lidocaine-prilocaine (EMLA) cream Apply 1 application topically daily as needed (pain).   loperamide (IMODIUM) 2 MG capsule Take 1 capsule (2 mg total) by mouth 2 (two) times daily.   metFORMIN (GLUCOPHAGE) 1000 MG tablet Take 1 tablet (1,000 mg total) by mouth daily.   ondansetron (ZOFRAN-ODT) 4 MG disintegrating tablet Take 1-2 tablets (4-8 mg total) by mouth every 8 (eight) hours as needed for nausea (not responsive to prochlorperazine (COMPAZINE)).   OneTouch Delica Lancets 16X MISC Use twice daily for glucose control, Dx E11.9   oxyCODONE (OXY IR/ROXICODONE) 5 MG immediate release tablet Take 1 tablet (5 mg total) by mouth every 4 (four) hours as needed for moderate pain.   OXYGEN Inhale 3 L into the lungs continuous.   pantoprazole (PROTONIX) 20 MG tablet TAKE 1 TABLET BY MOUTH EVERY DAY (Patient taking differently: Take 20 mg by mouth daily.)   prochlorperazine (COMPAZINE) 10 MG tablet Take 1 tablet (10 mg total) by mouth every 6 (six) hours as needed (Nausea or vomiting). (Patient taking differently: Take 10 mg by  mouth every 6 (six) hours as needed for nausea or vomiting.)   senna-docusate (SENOKOT-S) 8.6-50 MG tablet Take 1 tablet by mouth at bedtime as needed for mild constipation.   vitamin B-12 (CYANOCOBALAMIN) 1000 MCG tablet Take 1 tablet (1,000 mcg total) by mouth daily.   No facility-administered encounter medications on file as of 07/05/2020.    Thank you for the opportunity to participate in the care of Ms. Labrisha Neiss. Please  call our office at [phone] if we can be of additional assistance.   Jari Favre, DNP, AGPCNP-BC

## 2020-07-05 NOTE — Telephone Encounter (Signed)
Ok to order, but this was ordered from the hospital, and hospice had already sent me a request asking to be her hospice attending. Are we sure she needs another referral?

## 2020-07-05 NOTE — Telephone Encounter (Signed)
Form faxed and completed.

## 2020-07-05 NOTE — Telephone Encounter (Signed)
Okay to order?

## 2020-07-14 ENCOUNTER — Ambulatory Visit: Payer: 59 | Admitting: Hematology

## 2020-07-18 DEATH — deceased

## 2020-08-08 ENCOUNTER — Other Ambulatory Visit: Payer: Self-pay | Admitting: Internal Medicine

## 2020-08-18 ENCOUNTER — Other Ambulatory Visit: Payer: Self-pay | Admitting: Internal Medicine

## 2020-09-29 ENCOUNTER — Ambulatory Visit: Payer: 59 | Admitting: Internal Medicine

## 2021-02-10 NOTE — Telephone Encounter (Signed)
This encounter was created in error - please disregard.

## 2021-10-12 IMAGING — CT CT ABD-PELV W/ CM
2 of 5 series · 15 of 46 positions shown, 17 images · IV contrast (omnipaque)
Comparison: CT abdomen and pelvis 03/06/2020, CT chest 11/29/2019

CLINICAL DATA: Syncopal episode, acute abdominal pain, weakness,
history of pancreatic cancer on chemotherapy

EXAM:
CT ANGIOGRAPHY CHEST
CT ABDOMEN AND PELVIS WITH CONTRAST
TECHNIQUE: Multidetector CT imaging of the chest was performed using the
standard protocol during bolus administration of intravenous
contrast. Multiplanar CT image reconstructions and MIPs were
obtained to evaluate the vascular anatomy. Multidetector CT imaging
of the abdomen and pelvis was performed using the standard protocol
during bolus administration of intravenous contrast.
CONTRAST:  100mL OMNIPAQUE IOHEXOL 350 MG/ML SOLN IV. No oral
contrast.

[Series 2: axial st · axial · 0.83mm/px · z∈[-921,-486]mm · 12 of 103 slices shown, 14 images]
[im 8/103  soft-tissue]
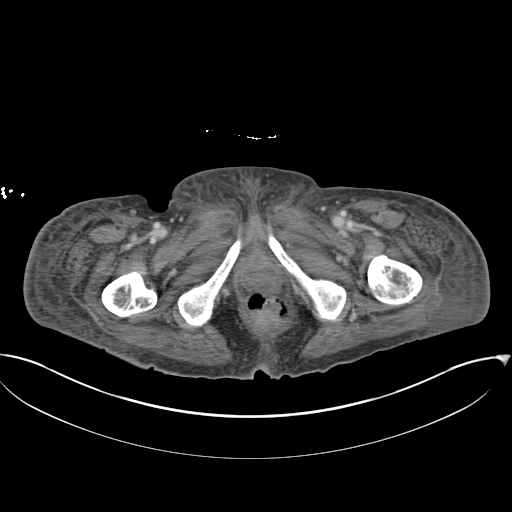
[im 8/103  bone]
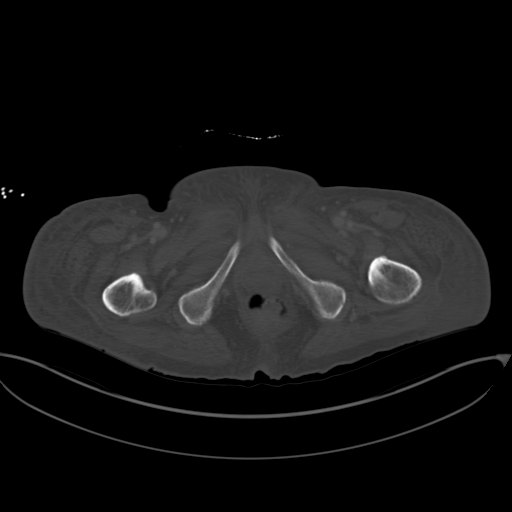
[im 15/103  soft-tissue]
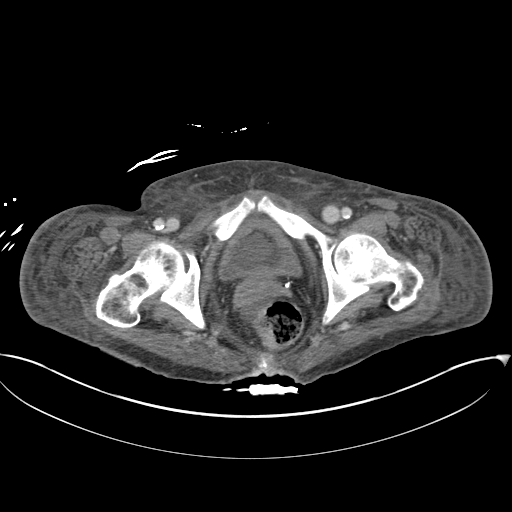
[im 22/103  soft-tissue]
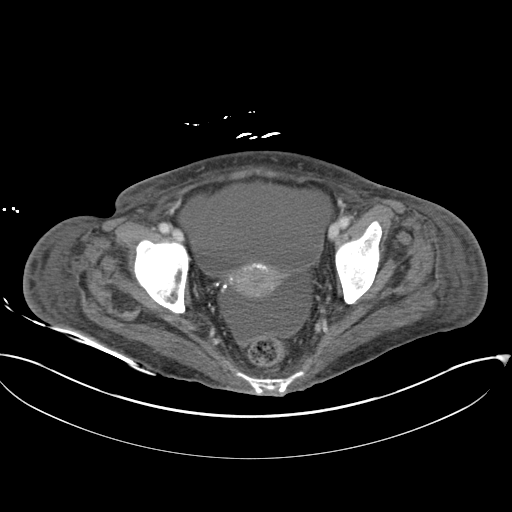
[im 30/103  soft-tissue]
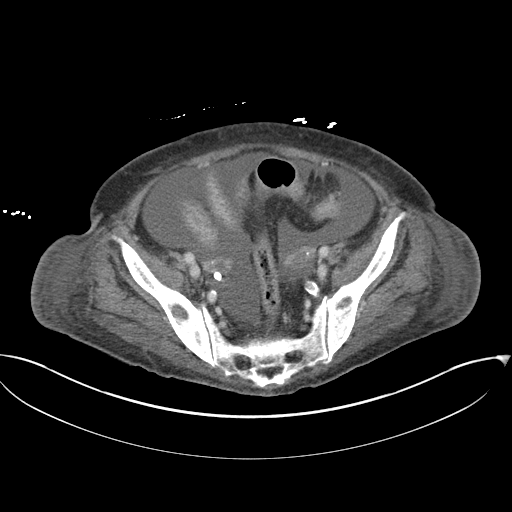
[im 37/103  soft-tissue]
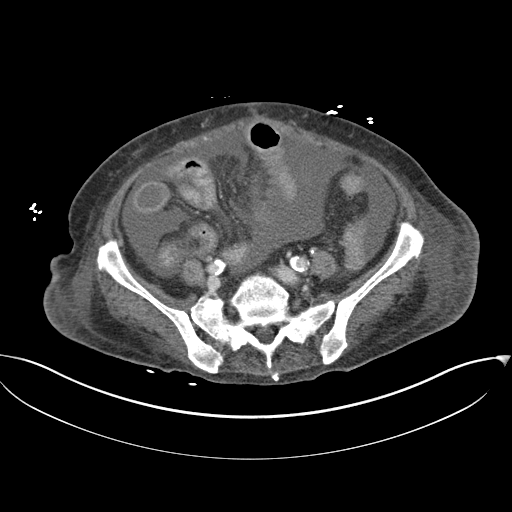
[im 44/103  soft-tissue]
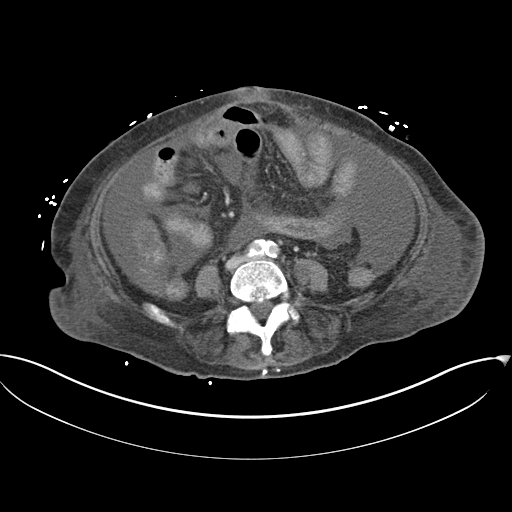
[im 59/103  soft-tissue]
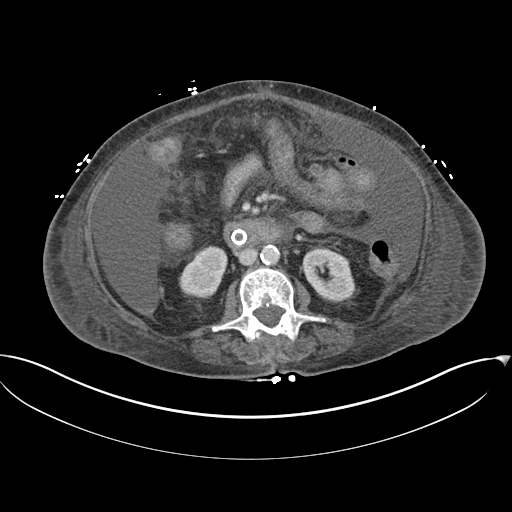
[im 66/103  soft-tissue]
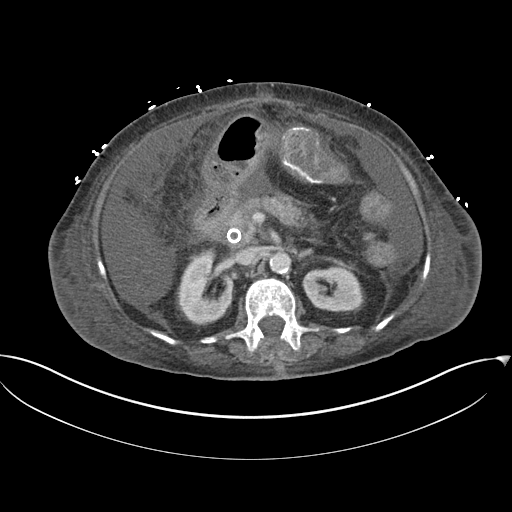
[im 73/103  soft-tissue]
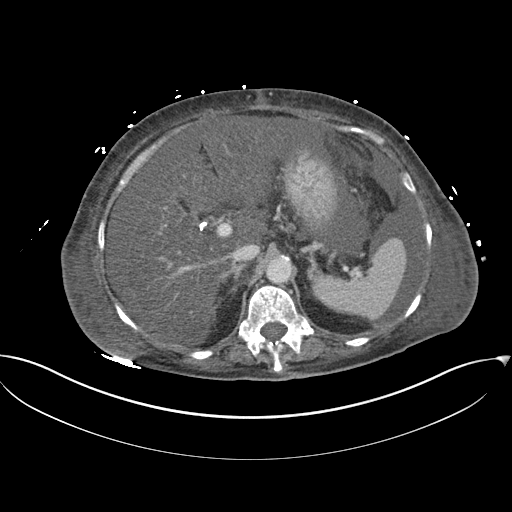
[im 73/103  bone]
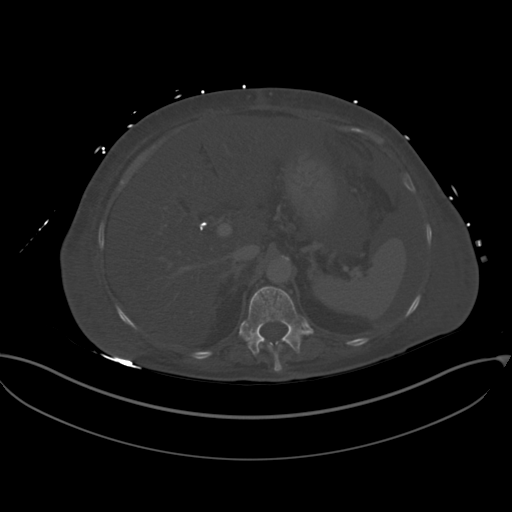
[im 81/103  soft-tissue]
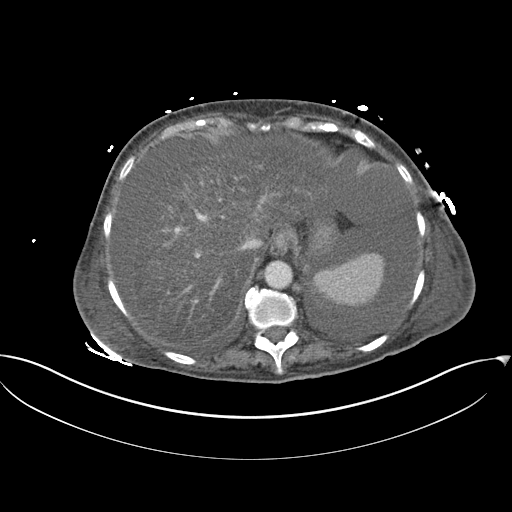
[im 88/103  soft-tissue]
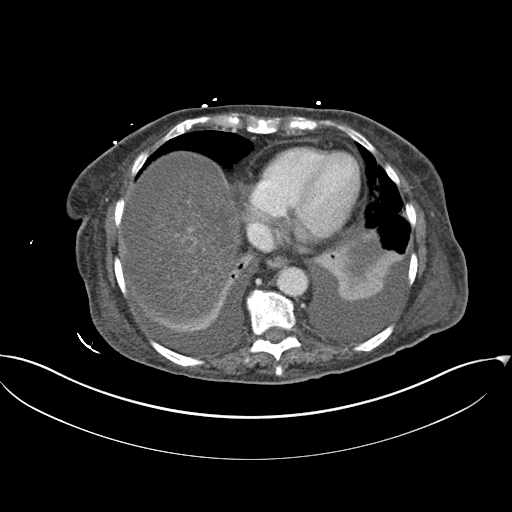
[im 95/103  soft-tissue]
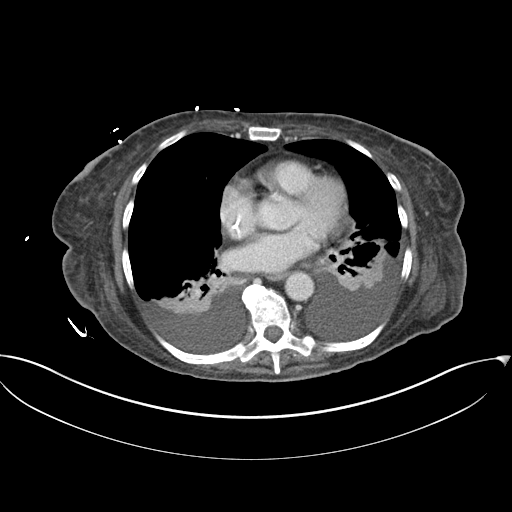

[Series 4: coronal st · coronal · 0.92mm/px · 3 of 90 slices shown]
[im 30/90  soft-tissue]
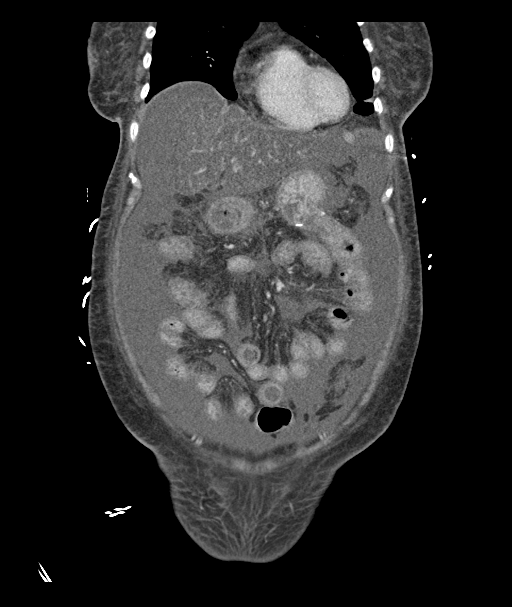
[im 40/90  soft-tissue]
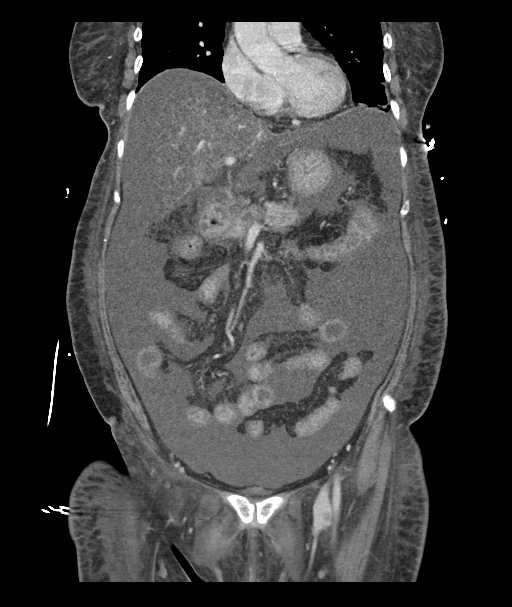
[im 50/90  soft-tissue]
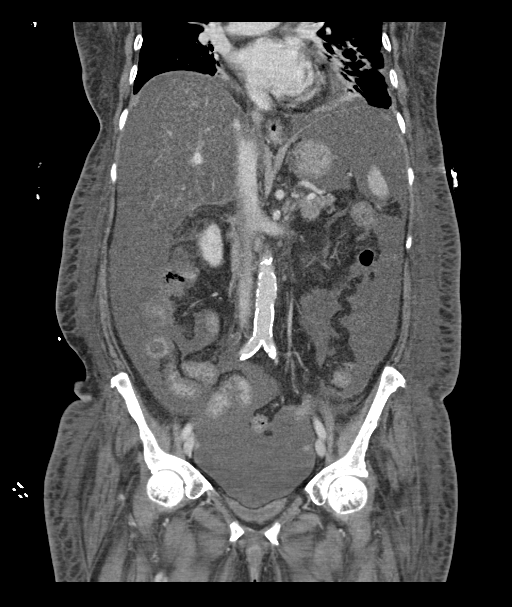

[15 of 46 positions shown; findings below may reference images not displayed]

FINDINGS: CTA CHEST FINDINGS

Cardiovascular: Atherosclerotic calcifications aorta and coronary
arteries. RIGHT jugular Port-A-Cath with tip in RIGHT atrium. Aorta
normal caliber without aneurysm or dissection. Heart normal size. No
pericardial effusion. Pulmonary arteries adequately opacified and
grossly patent. No evidence of pulmonary embolism.

Mediastinum/Nodes: Soft is unremarkable. Base of cervical region
normal appearance. No thoracic adenopathy.

Lungs/Pleura: Moderate pleural effusions bilaterally. Compressive
atelectasis of BILATERAL lower lobes and posterior upper lobes
minimal infiltrate in upper lobes. No pneumothorax. No definite
pulmonary mass/nodule.

Musculoskeletal: Diffuse osseous demineralization. No discrete bone
lesion.

Review of the MIP images confirms the above findings.

CT ABDOMEN and PELVIS FINDINGS

Hepatobiliary: Marked hepatic steatosis. Gallbladder surgically
absent. CBD stent. No hepatic mass or biliary dilatation. Small
nodule anterior to the LEFT lobe of the liver on the previous exam
is poorly demonstrated on the current study, subjectively smaller
but difficult to accurately measure due to ascites.

Pancreas: Mass at pancreatic head/uncinate 4.0 x 4.1 cm, appears
slightly smaller. Mild pancreatic ductal dilatation at body and
proximal tail. No additional mass.

Spleen: Normal appearance

Adrenals/Urinary Tract: LEFT adrenal fat containing nodule 16 x 12
mm consistent with myelolipoma. Adrenal glands otherwise
unremarkable. Small BILATERAL renal cysts. No hydronephrosis,
hydroureter, or urinary tract calcification. Bladder decompressed.

Stomach/Bowel: Scattered bowel wall thickening, mild in degree, may
be related to ascites. Diverticulosis of descending and sigmoid
colon without definite wall thickening to suggest diverticulitis.
Appendix surgically absent by history. Prior gastric bypass surgery.

Vascular/Lymphatic: Extensive atherosclerotic calcifications aorta
and iliac arteries without aneurysm. No definite adenopathy.

Reproductive: Atrophic uterus with unremarkable ovaries

Other: Significant ascites. No definite peritoneal based mass.
Scattered subcutaneous edema. No free air. No hernia.

Musculoskeletal: Osseous demineralization.

Review of the MIP images confirms the above findings.
IMPRESSION: No evidence of pulmonary embolism.

BILATERAL pleural effusions with compressive atelectasis of the
lower lobes and posterior upper lobes.

Slight decrease in size of pancreatic head mass.

Marked hepatic steatosis.

Slight decrease in size of nodule at the anterior surface LEFT lobe
liver.

CBD stent without biliary dilatation.

Scattered bowel wall thickening, may be related to ascites, unable
to exclude enteritis.

Distal colonic diverticulosis without evidence of diverticulitis.

LEFT adrenal myelolipoma.

Aortic Atherosclerosis ([id number]).

## 2021-10-12 IMAGING — DX DG CHEST 1V PORT
1 series · 1 of 1 positions shown · non-contrast
Comparison: CT chest 11/29/2019

CLINICAL DATA: Evaluate for sepsis. Shortness of breath and cough.
History of pancreas cancer.

EXAM:
PORTABLE CHEST 1 VIEW

[chest ap]
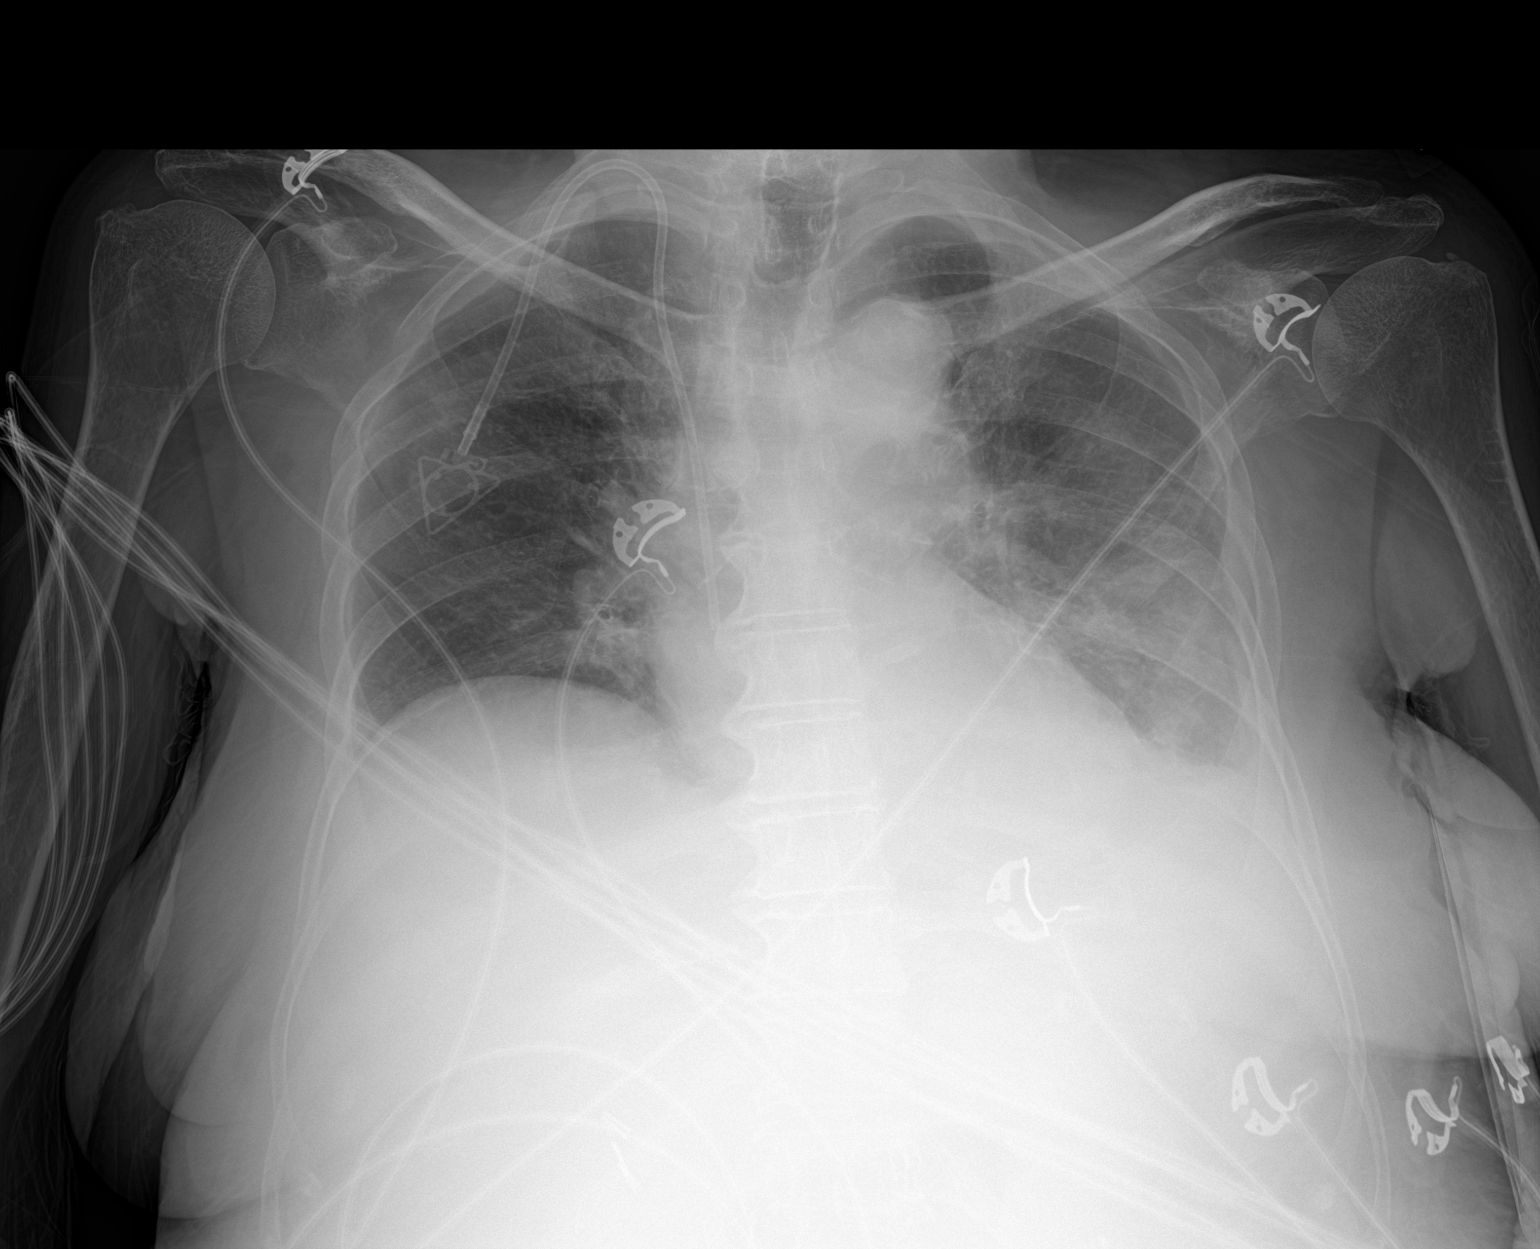

[1 of 1 positions shown; findings below may reference images not displayed]

FINDINGS: Right chest wall port a catheter identified with tip at the
cavoatrial junction. Normal heart size. A small left pleural
effusion is suspected. Additionally, there are patchy airspace
densities within the mid and lower left lung zone. Right lung
appears clear.
IMPRESSION: Left mid and lower lung zone airspace densities compatible with
pneumonia.

Suspect small left pleural effusion.
